# Patient Record
Sex: Female | Born: 1935
Health system: Southern US, Community
[De-identification: ages and names within clinical notes are randomized; demographics above are authoritative.]

## PROBLEM LIST (undated history)

## (undated) DIAGNOSIS — Z9221 Personal history of antineoplastic chemotherapy: Secondary | ICD-10-CM

## (undated) DIAGNOSIS — I10 Essential (primary) hypertension: Secondary | ICD-10-CM

## (undated) DIAGNOSIS — Z1211 Encounter for screening for malignant neoplasm of colon: Secondary | ICD-10-CM

## (undated) DIAGNOSIS — Z1239 Encounter for other screening for malignant neoplasm of breast: Secondary | ICD-10-CM

## (undated) DIAGNOSIS — F0390 Unspecified dementia without behavioral disturbance: Secondary | ICD-10-CM

## (undated) DIAGNOSIS — E079 Disorder of thyroid, unspecified: Secondary | ICD-10-CM

## (undated) DIAGNOSIS — Z87891 Personal history of nicotine dependence: Secondary | ICD-10-CM

## (undated) DIAGNOSIS — E669 Obesity, unspecified: Secondary | ICD-10-CM

## (undated) DIAGNOSIS — Z853 Personal history of malignant neoplasm of breast: Secondary | ICD-10-CM

## (undated) DIAGNOSIS — C50919 Malignant neoplasm of unspecified site of unspecified female breast: Secondary | ICD-10-CM

## (undated) DIAGNOSIS — C50219 Malignant neoplasm of upper-inner quadrant of unspecified female breast: Secondary | ICD-10-CM

## (undated) DIAGNOSIS — Z923 Personal history of irradiation: Secondary | ICD-10-CM

## (undated) DIAGNOSIS — C801 Malignant (primary) neoplasm, unspecified: Secondary | ICD-10-CM

## (undated) HISTORY — DX: Malignant (primary) neoplasm, unspecified: C80.1

## (undated) HISTORY — DX: Essential (primary) hypertension: I10

## (undated) HISTORY — PX: CORONARY STENT PLACEMENT: SHX1402

## (undated) HISTORY — DX: Encounter for other screening for malignant neoplasm of breast: Z12.39

## (undated) HISTORY — DX: Encounter for screening for malignant neoplasm of colon: Z12.11

## (undated) HISTORY — DX: Disorder of thyroid, unspecified: E07.9

## (undated) HISTORY — DX: Malignant neoplasm of upper-inner quadrant of unspecified female breast: C50.219

## (undated) HISTORY — DX: Obesity, unspecified: E66.9

## (undated) HISTORY — DX: Personal history of nicotine dependence: Z87.891

## (undated) HISTORY — DX: Personal history of malignant neoplasm of breast: Z85.3

---

## 1998-01-31 DIAGNOSIS — C50919 Malignant neoplasm of unspecified site of unspecified female breast: Secondary | ICD-10-CM

## 1998-01-31 DIAGNOSIS — C50219 Malignant neoplasm of upper-inner quadrant of unspecified female breast: Secondary | ICD-10-CM

## 1998-01-31 DIAGNOSIS — Z853 Personal history of malignant neoplasm of breast: Secondary | ICD-10-CM

## 1998-01-31 DIAGNOSIS — C801 Malignant (primary) neoplasm, unspecified: Secondary | ICD-10-CM

## 1998-01-31 HISTORY — PX: BREAST LUMPECTOMY: SHX2

## 1998-01-31 HISTORY — DX: Malignant (primary) neoplasm, unspecified: C80.1

## 1998-01-31 HISTORY — DX: Personal history of malignant neoplasm of breast: Z85.3

## 1998-01-31 HISTORY — PX: BREAST SURGERY: SHX581

## 1998-01-31 HISTORY — DX: Malignant neoplasm of unspecified site of unspecified female breast: C50.919

## 1998-01-31 HISTORY — DX: Malignant neoplasm of upper-inner quadrant of unspecified female breast: C50.219

## 2001-01-31 HISTORY — PX: COLONOSCOPY: SHX174

## 2003-02-01 HISTORY — PX: TRACHEOSTOMY: SUR1362

## 2003-02-01 HISTORY — PX: TUBAL LIGATION: SHX77

## 2004-01-05 ENCOUNTER — Ambulatory Visit: Payer: Self-pay | Admitting: General Surgery

## 2004-02-01 HISTORY — PX: BACK SURGERY: SHX140

## 2004-03-26 ENCOUNTER — Ambulatory Visit: Payer: Self-pay | Admitting: Oncology

## 2004-04-11 ENCOUNTER — Emergency Department: Payer: Self-pay | Admitting: Emergency Medicine

## 2004-04-19 ENCOUNTER — Observation Stay: Payer: Self-pay | Admitting: Internal Medicine

## 2004-04-21 ENCOUNTER — Inpatient Hospital Stay (HOSPITAL_COMMUNITY): Admission: AD | Admit: 2004-04-21 | Discharge: 2004-05-06 | Payer: Self-pay | Admitting: Neurosurgery

## 2004-04-21 ENCOUNTER — Ambulatory Visit: Payer: Self-pay | Admitting: Physical Medicine & Rehabilitation

## 2004-05-06 ENCOUNTER — Inpatient Hospital Stay
Admission: RE | Admit: 2004-05-06 | Discharge: 2004-05-18 | Payer: Self-pay | Admitting: Physical Medicine & Rehabilitation

## 2004-06-18 ENCOUNTER — Encounter: Admission: RE | Admit: 2004-06-18 | Discharge: 2004-06-18 | Payer: Self-pay | Admitting: Neurological Surgery

## 2004-06-23 ENCOUNTER — Encounter: Admission: RE | Admit: 2004-06-23 | Discharge: 2004-06-23 | Payer: Self-pay | Admitting: Thoracic Surgery

## 2004-07-23 ENCOUNTER — Ambulatory Visit: Payer: Self-pay | Admitting: Internal Medicine

## 2004-09-03 ENCOUNTER — Encounter: Admission: RE | Admit: 2004-09-03 | Discharge: 2004-09-03 | Payer: Self-pay | Admitting: Neurological Surgery

## 2004-09-22 ENCOUNTER — Ambulatory Visit: Payer: Self-pay | Admitting: Oncology

## 2004-09-22 ENCOUNTER — Encounter: Payer: Self-pay | Admitting: Internal Medicine

## 2004-10-01 ENCOUNTER — Ambulatory Visit: Payer: Self-pay | Admitting: Oncology

## 2004-12-20 ENCOUNTER — Emergency Department: Payer: Self-pay | Admitting: Emergency Medicine

## 2005-01-05 ENCOUNTER — Ambulatory Visit: Payer: Self-pay | Admitting: General Surgery

## 2005-05-02 ENCOUNTER — Ambulatory Visit: Payer: Self-pay | Admitting: Oncology

## 2005-05-31 ENCOUNTER — Ambulatory Visit: Payer: Self-pay | Admitting: Oncology

## 2006-01-06 ENCOUNTER — Emergency Department: Payer: Self-pay | Admitting: Emergency Medicine

## 2006-02-06 ENCOUNTER — Ambulatory Visit: Payer: Self-pay | Admitting: General Surgery

## 2006-06-17 ENCOUNTER — Emergency Department: Payer: Self-pay | Admitting: Emergency Medicine

## 2006-11-14 ENCOUNTER — Ambulatory Visit: Payer: Self-pay | Admitting: Internal Medicine

## 2007-02-01 HISTORY — PX: HIP SURGERY: SHX245

## 2007-02-01 HISTORY — PX: BREAST BIOPSY: SHX20

## 2007-02-16 ENCOUNTER — Ambulatory Visit: Payer: Self-pay | Admitting: General Surgery

## 2007-08-01 ENCOUNTER — Ambulatory Visit: Payer: Self-pay | Admitting: General Surgery

## 2007-12-24 ENCOUNTER — Ambulatory Visit: Payer: Self-pay | Admitting: Orthopedic Surgery

## 2007-12-31 ENCOUNTER — Ambulatory Visit: Payer: Self-pay | Admitting: Orthopedic Surgery

## 2008-01-07 ENCOUNTER — Ambulatory Visit: Payer: Self-pay | Admitting: Orthopedic Surgery

## 2008-02-01 DIAGNOSIS — K219 Gastro-esophageal reflux disease without esophagitis: Secondary | ICD-10-CM | POA: Diagnosis present

## 2008-02-01 DIAGNOSIS — I10 Essential (primary) hypertension: Secondary | ICD-10-CM

## 2008-02-01 DIAGNOSIS — E039 Hypothyroidism, unspecified: Secondary | ICD-10-CM | POA: Diagnosis present

## 2008-02-01 HISTORY — PX: CATARACT EXTRACTION: SUR2

## 2008-02-01 HISTORY — DX: Essential (primary) hypertension: I10

## 2008-02-18 ENCOUNTER — Ambulatory Visit: Payer: Self-pay | Admitting: General Surgery

## 2008-04-24 ENCOUNTER — Ambulatory Visit: Payer: Self-pay | Admitting: Anesthesiology

## 2008-05-22 ENCOUNTER — Ambulatory Visit: Payer: Self-pay | Admitting: Anesthesiology

## 2008-06-19 ENCOUNTER — Ambulatory Visit: Payer: Self-pay | Admitting: Anesthesiology

## 2009-02-23 ENCOUNTER — Ambulatory Visit: Payer: Self-pay | Admitting: General Surgery

## 2009-03-19 ENCOUNTER — Emergency Department: Payer: Self-pay | Admitting: Emergency Medicine

## 2009-07-14 ENCOUNTER — Ambulatory Visit: Payer: Self-pay | Admitting: Specialist

## 2010-01-31 DIAGNOSIS — E079 Disorder of thyroid, unspecified: Secondary | ICD-10-CM

## 2010-01-31 HISTORY — DX: Disorder of thyroid, unspecified: E07.9

## 2010-03-09 ENCOUNTER — Ambulatory Visit: Payer: Self-pay | Admitting: General Surgery

## 2011-03-23 ENCOUNTER — Ambulatory Visit: Payer: Self-pay | Admitting: General Surgery

## 2011-05-03 ENCOUNTER — Ambulatory Visit: Payer: Self-pay | Admitting: General Surgery

## 2011-11-28 ENCOUNTER — Ambulatory Visit: Payer: Self-pay | Admitting: Specialist

## 2011-11-28 LAB — HEMOGLOBIN: HGB: 13.7 g/dL (ref 12.0–16.0)

## 2011-12-05 ENCOUNTER — Ambulatory Visit: Payer: Self-pay | Admitting: Specialist

## 2012-04-30 ENCOUNTER — Encounter: Payer: Self-pay | Admitting: *Deleted

## 2012-04-30 ENCOUNTER — Ambulatory Visit: Payer: Self-pay | Admitting: General Surgery

## 2012-04-30 DIAGNOSIS — C801 Malignant (primary) neoplasm, unspecified: Secondary | ICD-10-CM | POA: Insufficient documentation

## 2012-05-01 ENCOUNTER — Encounter: Payer: Self-pay | Admitting: General Surgery

## 2012-05-09 ENCOUNTER — Ambulatory Visit: Payer: Self-pay | Admitting: General Surgery

## 2012-06-12 ENCOUNTER — Other Ambulatory Visit: Payer: Self-pay | Admitting: *Deleted

## 2012-06-12 ENCOUNTER — Encounter: Payer: Self-pay | Admitting: General Surgery

## 2012-06-12 ENCOUNTER — Ambulatory Visit (INDEPENDENT_AMBULATORY_CARE_PROVIDER_SITE_OTHER): Payer: Medicare Other | Admitting: General Surgery

## 2012-06-12 VITALS — BP 130/62 | HR 80 | Resp 16 | Ht 60.0 in | Wt 147.0 lb

## 2012-06-12 DIAGNOSIS — Z853 Personal history of malignant neoplasm of breast: Secondary | ICD-10-CM

## 2012-06-12 NOTE — Patient Instructions (Signed)
Patient to return in one year. 

## 2012-06-12 NOTE — Progress Notes (Signed)
The patient has been asked to return to the office in one year for a bilateral diagnostic mammogram. 
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
## 2013-05-01 ENCOUNTER — Ambulatory Visit: Payer: Self-pay | Admitting: General Surgery

## 2013-05-02 ENCOUNTER — Encounter: Payer: Self-pay | Admitting: General Surgery

## 2013-05-08 ENCOUNTER — Ambulatory Visit (INDEPENDENT_AMBULATORY_CARE_PROVIDER_SITE_OTHER): Payer: Medicare HMO | Admitting: General Surgery

## 2013-05-08 ENCOUNTER — Encounter: Payer: Self-pay | Admitting: General Surgery

## 2013-05-08 VITALS — BP 110/78 | HR 80 | Resp 14 | Ht 60.0 in | Wt 144.0 lb

## 2013-05-08 DIAGNOSIS — Z853 Personal history of malignant neoplasm of breast: Secondary | ICD-10-CM
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
## 2013-05-08 NOTE — Patient Instructions (Signed)
Continue self breast exams. Call office for any new breast issues or concerns. 

## 2013-12-02 ENCOUNTER — Encounter: Payer: Self-pay | Admitting: General Surgery

## 2014-05-05 ENCOUNTER — Ambulatory Visit
Admit: 2014-05-05 | Disposition: A | Discharge status: Home / Self Care | Payer: Self-pay | Attending: General Surgery | Admitting: General Surgery

## 2014-05-07 ENCOUNTER — Encounter: Payer: Self-pay | Admitting: General Surgery

## 2014-05-14 ENCOUNTER — Ambulatory Visit (INDEPENDENT_AMBULATORY_CARE_PROVIDER_SITE_OTHER): Payer: PPO | Admitting: General Surgery

## 2014-05-14 VITALS — BP 128/68 | HR 66 | Resp 12 | Ht 60.0 in | Wt 140.0 lb

## 2014-05-14 DIAGNOSIS — Z853 Personal history of malignant neoplasm of breast: Secondary | ICD-10-CM
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
## 2014-05-14 NOTE — Patient Instructions (Signed)
Patient will be asked to return to the office in one year with a bilateral screening mammogram. 

## 2014-05-20 NOTE — Op Note (Signed)
PATIENT NAME:  Lisa Martin, Lisa Martin MR#:  [id number] DATE OF BIRTH:  26-Dec-1931  DATE OF PROCEDURE:  12/05/2011  PREOPERATIVE DIAGNOSIS: Left carpal tunnel syndrome.   POSTOPERATIVE DIAGNOSIS: Left carpal tunnel syndrome.   PROCEDURE: Left carpal tunnel release.   SURGEON: Lucas Mallow, MD   ANESTHESIA: General.   COMPLICATIONS: None.   TOURNIQUET TIME: Approximately 15 minutes.   PROCEDURE: After adequate induction of general anesthesia, the left upper extremity is thoroughly prepped with alcohol and ChloraPrep and draped in standard sterile fashion. The extremity is wrapped out with the Esmarch bandage and pneumatic tourniquet elevated to 250 mmHg. Under loupe magnification, standard volar carpal tunnel incision is made and the dissection carried down to the transverse retinacular ligament. This is incised in the midportion with the knife. The proximal release is performed with the small scissors. The distal release is performed with the small scissors and the carpal tunnel scissors. There is seen to be severe compression of the nerve directly beneath the ligament. No mass or synovitis is noted. The wound is thoroughly irrigated multiple times. Careful check is made both proximally and distally to ensure that complete release had been obtained. Skin edges are infiltrated with 0.5% plain Marcaine. The skin is closed with 4-0 nylon. Soft bulky dressing is applied. Tourniquet is released. The patient is returned to the recovery room in satisfactory condition having tolerated the procedure quite well.   ____________________________ Lucas Mallow, MD ces:drc D: 12/05/2011 13:37:03 ET T: 12/05/2011 17:19:54 ET JOB#: [id number]  cc: Lucas Mallow, MD, <Dictator> Lucas Mallow MD ELECTRONICALLY SIGNED 12/05/2011 19:11

## 2014-08-08 ENCOUNTER — Other Ambulatory Visit: Payer: Self-pay | Admitting: Physical Medicine and Rehabilitation

## 2014-08-08 DIAGNOSIS — M5416 Radiculopathy, lumbar region: Secondary | ICD-10-CM

## 2014-09-10 ENCOUNTER — Ambulatory Visit
Admission: RE | Admit: 2014-09-10 | Discharge: 2014-09-10 | Disposition: A | Discharge status: Home / Self Care | Payer: PPO | Source: Ambulatory Visit | Attending: Physical Medicine and Rehabilitation | Admitting: Physical Medicine and Rehabilitation

## 2014-09-10 DIAGNOSIS — M47897 Other spondylosis, lumbosacral region: Secondary | ICD-10-CM | POA: Diagnosis not present

## 2014-09-10 DIAGNOSIS — M4806 Spinal stenosis, lumbar region: Secondary | ICD-10-CM | POA: Diagnosis not present

## 2014-09-10 DIAGNOSIS — S22089A Unspecified fracture of T11-T12 vertebra, initial encounter for closed fracture: Secondary | ICD-10-CM | POA: Insufficient documentation

## 2014-09-10 DIAGNOSIS — M4807 Spinal stenosis, lumbosacral region: Secondary | ICD-10-CM | POA: Diagnosis not present

## 2014-09-10 DIAGNOSIS — M5136 Other intervertebral disc degeneration, lumbar region: Secondary | ICD-10-CM | POA: Insufficient documentation

## 2014-09-10 DIAGNOSIS — M5416 Radiculopathy, lumbar region: Secondary | ICD-10-CM | POA: Diagnosis present

## 2015-02-11 DIAGNOSIS — E039 Hypothyroidism, unspecified: Secondary | ICD-10-CM | POA: Diagnosis not present

## 2015-02-11 DIAGNOSIS — R5383 Other fatigue: Secondary | ICD-10-CM | POA: Diagnosis not present

## 2015-02-11 DIAGNOSIS — I10 Essential (primary) hypertension: Secondary | ICD-10-CM | POA: Diagnosis not present

## 2015-02-11 DIAGNOSIS — E538 Deficiency of other specified B group vitamins: Secondary | ICD-10-CM | POA: Diagnosis not present

## 2015-02-11 DIAGNOSIS — R05 Cough: Secondary | ICD-10-CM | POA: Diagnosis not present

## 2015-02-11 DIAGNOSIS — R0602 Shortness of breath: Secondary | ICD-10-CM | POA: Diagnosis not present

## 2015-03-03 ENCOUNTER — Other Ambulatory Visit: Payer: Self-pay | Admitting: *Deleted

## 2015-03-03 DIAGNOSIS — Z853 Personal history of malignant neoplasm of breast: Secondary | ICD-10-CM

## 2015-03-10 ENCOUNTER — Encounter: Payer: Self-pay | Admitting: *Deleted

## 2015-05-06 ENCOUNTER — Ambulatory Visit
Admission: RE | Admit: 2015-05-06 | Discharge: 2015-05-06 | Disposition: A | Discharge status: Home / Self Care | Payer: PPO | Source: Ambulatory Visit | Attending: General Surgery | Admitting: General Surgery

## 2015-05-06 DIAGNOSIS — Z853 Personal history of malignant neoplasm of breast: Secondary | ICD-10-CM

## 2015-05-06 DIAGNOSIS — Z1231 Encounter for screening mammogram for malignant neoplasm of breast: Secondary | ICD-10-CM | POA: Diagnosis not present

## 2015-05-06 HISTORY — DX: Malignant neoplasm of unspecified site of unspecified female breast: C50.919

## 2015-05-14 ENCOUNTER — Encounter: Payer: Self-pay | Admitting: General Surgery

## 2015-05-14 ENCOUNTER — Ambulatory Visit (INDEPENDENT_AMBULATORY_CARE_PROVIDER_SITE_OTHER): Payer: PPO | Admitting: General Surgery

## 2015-05-14 VITALS — BP 130/72 | HR 82 | Resp 14 | Ht 60.0 in | Wt 128.0 lb

## 2015-05-14 DIAGNOSIS — Z853 Personal history of malignant neoplasm of breast: Secondary | ICD-10-CM | POA: Diagnosis not present

## 2015-05-14 NOTE — Patient Instructions (Addendum)
The patient is aware to call back for any questions or concerns. Patient will be asked to return to the office in one year with a bilateral screening mammogram. 
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
## 2015-06-25 DIAGNOSIS — M810 Age-related osteoporosis without current pathological fracture: Secondary | ICD-10-CM | POA: Diagnosis not present

## 2015-06-25 DIAGNOSIS — E538 Deficiency of other specified B group vitamins: Secondary | ICD-10-CM | POA: Diagnosis not present

## 2015-06-25 DIAGNOSIS — Z0000 Encounter for general adult medical examination without abnormal findings: Secondary | ICD-10-CM | POA: Diagnosis not present

## 2015-07-02 DIAGNOSIS — Z0000 Encounter for general adult medical examination without abnormal findings: Secondary | ICD-10-CM | POA: Diagnosis not present

## 2015-07-02 DIAGNOSIS — E538 Deficiency of other specified B group vitamins: Secondary | ICD-10-CM | POA: Diagnosis not present

## 2015-07-02 DIAGNOSIS — E039 Hypothyroidism, unspecified: Secondary | ICD-10-CM | POA: Diagnosis not present

## 2015-07-02 DIAGNOSIS — M810 Age-related osteoporosis without current pathological fracture: Secondary | ICD-10-CM | POA: Diagnosis not present

## 2015-07-06 DIAGNOSIS — Z23 Encounter for immunization: Secondary | ICD-10-CM | POA: Diagnosis not present

## 2015-07-06 DIAGNOSIS — S80811A Abrasion, right lower leg, initial encounter: Secondary | ICD-10-CM | POA: Diagnosis not present

## 2015-08-06 DIAGNOSIS — M810 Age-related osteoporosis without current pathological fracture: Secondary | ICD-10-CM | POA: Diagnosis not present

## 2015-08-09 ENCOUNTER — Emergency Department: Payer: PPO

## 2015-08-09 ENCOUNTER — Emergency Department
Admission: EM | Admit: 2015-08-09 | Discharge: 2015-08-10 | Disposition: A | Discharge status: Home / Self Care | Payer: PPO | Attending: Emergency Medicine | Admitting: Emergency Medicine

## 2015-08-09 DIAGNOSIS — Z79899 Other long term (current) drug therapy: Secondary | ICD-10-CM | POA: Insufficient documentation

## 2015-08-09 DIAGNOSIS — M79605 Pain in left leg: Secondary | ICD-10-CM | POA: Diagnosis not present

## 2015-08-09 DIAGNOSIS — Z853 Personal history of malignant neoplasm of breast: Secondary | ICD-10-CM | POA: Insufficient documentation

## 2015-08-09 DIAGNOSIS — I10 Essential (primary) hypertension: Secondary | ICD-10-CM | POA: Insufficient documentation

## 2015-08-09 DIAGNOSIS — Z87891 Personal history of nicotine dependence: Secondary | ICD-10-CM | POA: Diagnosis not present

## 2015-08-09 DIAGNOSIS — M79652 Pain in left thigh: Secondary | ICD-10-CM | POA: Diagnosis not present

## 2015-08-09 DIAGNOSIS — M79662 Pain in left lower leg: Secondary | ICD-10-CM | POA: Diagnosis not present

## 2015-08-09 MED ORDER — OXYCODONE-ACETAMINOPHEN 5-325 MG PO TABS
1.0000 | ORAL_TABLET | Freq: Once | ORAL | Status: AC
  Administered 2015-08-10: 1 via ORAL
  Filled 2015-08-09: qty 1

## 2015-08-09 NOTE — ED Notes (Signed)
Patient to ED via POV for left leg pain after a fall onto concrete 30 mins PTA. Denies striking her head. Alert and oriented x 3.
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
## 2015-08-09 NOTE — ED Notes (Signed)
Radiology tech in to take xray.

## 2015-08-10 MED ORDER — ETODOLAC 200 MG PO CAPS: 200.0000 mg | ORAL_CAPSULE | Freq: Three times a day (TID) | ORAL | Status: DC

## 2015-08-10 NOTE — Discharge Instructions (Signed)

## 2015-08-10 NOTE — ED Notes (Signed)
Pt ambulated approximately 80 feet with use of a rolling walker with slow gait. Pt left leg became weak x 1 with pt able to move her weight to her right leg and steady herself. Dr Dahlia Client aware/

## 2015-08-13 DIAGNOSIS — M25551 Pain in right hip: Secondary | ICD-10-CM | POA: Diagnosis not present

## 2015-08-28 DIAGNOSIS — M5430 Sciatica, unspecified side: Secondary | ICD-10-CM | POA: Diagnosis not present

## 2015-09-01 DIAGNOSIS — E039 Hypothyroidism, unspecified: Secondary | ICD-10-CM | POA: Diagnosis not present

## 2015-09-15 DIAGNOSIS — E039 Hypothyroidism, unspecified: Secondary | ICD-10-CM | POA: Diagnosis not present

## 2015-09-15 DIAGNOSIS — R531 Weakness: Secondary | ICD-10-CM | POA: Diagnosis not present

## 2015-09-15 DIAGNOSIS — I10 Essential (primary) hypertension: Secondary | ICD-10-CM | POA: Diagnosis not present

## 2015-10-21 ENCOUNTER — Ambulatory Visit
Admission: RE | Admit: 2015-10-21 | Discharge: 2015-10-21 | Disposition: A | Discharge status: Home / Self Care | Payer: PPO | Source: Ambulatory Visit | Attending: Unknown Physician Specialty | Admitting: Unknown Physician Specialty

## 2015-10-21 ENCOUNTER — Other Ambulatory Visit: Payer: Self-pay | Admitting: Unknown Physician Specialty

## 2015-10-21 DIAGNOSIS — R05 Cough: Secondary | ICD-10-CM

## 2015-10-21 DIAGNOSIS — J449 Chronic obstructive pulmonary disease, unspecified: Secondary | ICD-10-CM | POA: Diagnosis not present

## 2015-10-21 DIAGNOSIS — H903 Sensorineural hearing loss, bilateral: Secondary | ICD-10-CM | POA: Diagnosis not present

## 2015-10-21 DIAGNOSIS — J984 Other disorders of lung: Secondary | ICD-10-CM | POA: Insufficient documentation

## 2015-10-21 DIAGNOSIS — H6121 Impacted cerumen, right ear: Secondary | ICD-10-CM | POA: Diagnosis not present

## 2015-10-21 DIAGNOSIS — R059 Cough, unspecified: Secondary | ICD-10-CM

## 2016-01-19 DIAGNOSIS — I10 Essential (primary) hypertension: Secondary | ICD-10-CM | POA: Diagnosis not present

## 2016-01-19 DIAGNOSIS — J449 Chronic obstructive pulmonary disease, unspecified: Secondary | ICD-10-CM | POA: Diagnosis not present

## 2016-01-19 DIAGNOSIS — R05 Cough: Secondary | ICD-10-CM | POA: Diagnosis not present

## 2016-03-17 ENCOUNTER — Other Ambulatory Visit: Payer: Self-pay

## 2016-03-17 DIAGNOSIS — Z1231 Encounter for screening mammogram for malignant neoplasm of breast: Secondary | ICD-10-CM

## 2016-03-17 DIAGNOSIS — Z853 Personal history of malignant neoplasm of breast: Secondary | ICD-10-CM

## 2016-04-19 DIAGNOSIS — R05 Cough: Secondary | ICD-10-CM | POA: Diagnosis not present

## 2016-04-19 DIAGNOSIS — I10 Essential (primary) hypertension: Secondary | ICD-10-CM | POA: Diagnosis not present

## 2016-04-19 DIAGNOSIS — M542 Cervicalgia: Secondary | ICD-10-CM | POA: Diagnosis not present

## 2016-05-11 ENCOUNTER — Telehealth: Payer: Self-pay | Admitting: *Deleted

## 2016-05-11 NOTE — Telephone Encounter (Signed)
Left message for patient to call back to reschedule appointment, was a no show for mammogram.

## 2016-05-17 ENCOUNTER — Ambulatory Visit: Payer: PPO | Admitting: General Surgery

## 2016-06-09 ENCOUNTER — Encounter: Payer: Self-pay | Admitting: *Deleted

## 2016-06-28 DIAGNOSIS — E538 Deficiency of other specified B group vitamins: Secondary | ICD-10-CM | POA: Diagnosis not present

## 2016-06-28 DIAGNOSIS — Z0000 Encounter for general adult medical examination without abnormal findings: Secondary | ICD-10-CM | POA: Diagnosis not present

## 2016-06-28 DIAGNOSIS — E039 Hypothyroidism, unspecified: Secondary | ICD-10-CM | POA: Diagnosis not present

## 2016-07-01 ENCOUNTER — Ambulatory Visit
Admission: RE | Admit: 2016-07-01 | Discharge: 2016-07-01 | Disposition: A | Discharge status: Home / Self Care | Payer: PPO | Source: Ambulatory Visit | Attending: General Surgery | Admitting: General Surgery

## 2016-07-01 DIAGNOSIS — Z1231 Encounter for screening mammogram for malignant neoplasm of breast: Secondary | ICD-10-CM | POA: Insufficient documentation

## 2016-07-04 ENCOUNTER — Encounter: Payer: Self-pay | Admitting: *Deleted

## 2016-07-04 DIAGNOSIS — Z0000 Encounter for general adult medical examination without abnormal findings: Secondary | ICD-10-CM | POA: Diagnosis not present

## 2016-07-04 DIAGNOSIS — M818 Other osteoporosis without current pathological fracture: Secondary | ICD-10-CM | POA: Diagnosis not present

## 2016-07-04 DIAGNOSIS — R05 Cough: Secondary | ICD-10-CM | POA: Diagnosis not present

## 2016-07-04 DIAGNOSIS — Z79899 Other long term (current) drug therapy: Secondary | ICD-10-CM | POA: Diagnosis not present

## 2016-07-04 DIAGNOSIS — E538 Deficiency of other specified B group vitamins: Secondary | ICD-10-CM | POA: Diagnosis not present

## 2016-07-04 DIAGNOSIS — E039 Hypothyroidism, unspecified: Secondary | ICD-10-CM | POA: Diagnosis not present

## 2016-07-05 ENCOUNTER — Other Ambulatory Visit: Payer: Self-pay | Admitting: Internal Medicine

## 2016-07-05 DIAGNOSIS — R9389 Abnormal findings on diagnostic imaging of other specified body structures: Secondary | ICD-10-CM

## 2016-07-05 DIAGNOSIS — R053 Chronic cough: Secondary | ICD-10-CM

## 2016-07-05 DIAGNOSIS — R05 Cough: Secondary | ICD-10-CM

## 2016-07-07 ENCOUNTER — Encounter: Payer: Self-pay | Admitting: General Surgery

## 2016-07-07 ENCOUNTER — Ambulatory Visit (INDEPENDENT_AMBULATORY_CARE_PROVIDER_SITE_OTHER): Payer: PPO | Admitting: General Surgery

## 2016-07-07 VITALS — BP 124/62 | HR 74 | Resp 12 | Ht 60.0 in | Wt 120.0 lb

## 2016-07-07 DIAGNOSIS — Z853 Personal history of malignant neoplasm of breast: Secondary | ICD-10-CM | POA: Diagnosis not present

## 2016-07-07 NOTE — Patient Instructions (Signed)
The patient is aware to call back for any questions or concerns.  
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
## 2016-07-15 ENCOUNTER — Ambulatory Visit
Admission: RE | Admit: 2016-07-15 | Discharge: 2016-07-15 | Disposition: A | Discharge status: Home / Self Care | Payer: PPO | Source: Ambulatory Visit | Attending: Internal Medicine | Admitting: Internal Medicine

## 2016-07-15 DIAGNOSIS — R05 Cough: Secondary | ICD-10-CM | POA: Diagnosis not present

## 2016-07-15 DIAGNOSIS — R938 Abnormal findings on diagnostic imaging of other specified body structures: Secondary | ICD-10-CM | POA: Insufficient documentation

## 2016-07-15 DIAGNOSIS — J984 Other disorders of lung: Secondary | ICD-10-CM | POA: Insufficient documentation

## 2016-07-15 DIAGNOSIS — I700 Atherosclerosis of aorta: Secondary | ICD-10-CM | POA: Diagnosis not present

## 2016-07-15 DIAGNOSIS — J439 Emphysema, unspecified: Secondary | ICD-10-CM | POA: Diagnosis not present

## 2016-07-15 DIAGNOSIS — Y842 Radiological procedure and radiotherapy as the cause of abnormal reaction of the patient, or of later complication, without mention of misadventure at the time of the procedure: Secondary | ICD-10-CM | POA: Diagnosis not present

## 2016-07-15 DIAGNOSIS — I2510 Atherosclerotic heart disease of native coronary artery without angina pectoris: Secondary | ICD-10-CM | POA: Diagnosis not present

## 2016-07-15 DIAGNOSIS — R053 Chronic cough: Secondary | ICD-10-CM

## 2016-07-15 DIAGNOSIS — R9389 Abnormal findings on diagnostic imaging of other specified body structures: Secondary | ICD-10-CM

## 2016-07-15 MED ORDER — IOPAMIDOL (ISOVUE-300) INJECTION 61%
75.0000 mL | Freq: Once | INTRAVENOUS | Status: AC | PRN
  Administered 2016-07-15: 75 mL via INTRAVENOUS

## 2016-08-22 DIAGNOSIS — J701 Chronic and other pulmonary manifestations due to radiation: Secondary | ICD-10-CM | POA: Diagnosis not present

## 2016-08-22 DIAGNOSIS — E538 Deficiency of other specified B group vitamins: Secondary | ICD-10-CM | POA: Diagnosis not present

## 2016-08-22 DIAGNOSIS — E039 Hypothyroidism, unspecified: Secondary | ICD-10-CM | POA: Diagnosis not present

## 2016-09-25 ENCOUNTER — Encounter: Payer: Self-pay | Admitting: Emergency Medicine

## 2016-09-25 ENCOUNTER — Emergency Department: Payer: PPO

## 2016-09-25 ENCOUNTER — Emergency Department
Admission: EM | Admit: 2016-09-25 | Discharge: 2016-09-25 | Disposition: A | Discharge status: Home / Self Care | Payer: PPO | Attending: Emergency Medicine | Admitting: Emergency Medicine

## 2016-09-25 DIAGNOSIS — Y9389 Activity, other specified: Secondary | ICD-10-CM | POA: Insufficient documentation

## 2016-09-25 DIAGNOSIS — S52502A Unspecified fracture of the lower end of left radius, initial encounter for closed fracture: Secondary | ICD-10-CM

## 2016-09-25 DIAGNOSIS — Y929 Unspecified place or not applicable: Secondary | ICD-10-CM | POA: Diagnosis not present

## 2016-09-25 DIAGNOSIS — W109XXA Fall (on) (from) unspecified stairs and steps, initial encounter: Secondary | ICD-10-CM | POA: Insufficient documentation

## 2016-09-25 DIAGNOSIS — Z79899 Other long term (current) drug therapy: Secondary | ICD-10-CM | POA: Diagnosis not present

## 2016-09-25 DIAGNOSIS — Z853 Personal history of malignant neoplasm of breast: Secondary | ICD-10-CM | POA: Insufficient documentation

## 2016-09-25 DIAGNOSIS — Z96642 Presence of left artificial hip joint: Secondary | ICD-10-CM | POA: Diagnosis not present

## 2016-09-25 DIAGNOSIS — I10 Essential (primary) hypertension: Secondary | ICD-10-CM | POA: Diagnosis not present

## 2016-09-25 DIAGNOSIS — S6992XA Unspecified injury of left wrist, hand and finger(s), initial encounter: Secondary | ICD-10-CM | POA: Diagnosis not present

## 2016-09-25 DIAGNOSIS — Z87891 Personal history of nicotine dependence: Secondary | ICD-10-CM | POA: Insufficient documentation

## 2016-09-25 DIAGNOSIS — Y999 Unspecified external cause status: Secondary | ICD-10-CM | POA: Diagnosis not present

## 2016-09-25 DIAGNOSIS — S52602A Unspecified fracture of lower end of left ulna, initial encounter for closed fracture: Secondary | ICD-10-CM | POA: Insufficient documentation

## 2016-09-25 DIAGNOSIS — S52572A Other intraarticular fracture of lower end of left radius, initial encounter for closed fracture: Secondary | ICD-10-CM

## 2016-09-25 MED ORDER — TRAMADOL HCL 50 MG PO TABS
50.0000 mg | ORAL_TABLET | Freq: Once | ORAL | Status: AC
  Administered 2016-09-25: 50 mg via ORAL
  Filled 2016-09-25: qty 1

## 2016-09-25 MED ORDER — TRAMADOL HCL 50 MG PO TABS: 50.0000 mg | ORAL_TABLET | Freq: Four times a day (QID) | ORAL | 0 refills | Status: DC | PRN

## 2016-09-25 NOTE — Discharge Instructions (Signed)
Please keep the left upper extremity elevated, work on gentle range of motion of the digits. Take Tylenol and tramadol as needed for pain.You may continue with etodolac. Please call orthopedic office tomorrow to schedule follow-up appointment.

## 2016-09-25 NOTE — ED Notes (Signed)
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
## 2016-09-25 NOTE — ED Triage Notes (Signed)
Pt arrives from home with c/o left wrist injury. Pt was carrying multiple objects and overcompensated and fell onto her left wrist. Pt denies LOC or head trauma of any kind and is on no blood thinners. Pt has noticeable deformity to left wrist. Pt is in NAD at this time.

## 2016-09-27 DIAGNOSIS — S52552A Other extraarticular fracture of lower end of left radius, initial encounter for closed fracture: Secondary | ICD-10-CM | POA: Diagnosis not present

## 2016-09-27 DIAGNOSIS — S52692A Other fracture of lower end of left ulna, initial encounter for closed fracture: Secondary | ICD-10-CM | POA: Diagnosis not present

## 2016-09-27 DIAGNOSIS — W108XXA Fall (on) (from) other stairs and steps, initial encounter: Secondary | ICD-10-CM | POA: Diagnosis not present

## 2016-09-30 ENCOUNTER — Encounter: Payer: Self-pay | Admitting: *Deleted

## 2016-09-30 ENCOUNTER — Ambulatory Visit
Admission: RE | Admit: 2016-09-30 | Discharge: 2016-09-30 | Disposition: A | Discharge status: Home / Self Care | Payer: PPO | Source: Ambulatory Visit | Attending: Orthopedic Surgery | Admitting: Orthopedic Surgery

## 2016-09-30 ENCOUNTER — Ambulatory Visit: Payer: PPO | Admitting: Anesthesiology

## 2016-09-30 ENCOUNTER — Ambulatory Visit: Payer: PPO

## 2016-09-30 ENCOUNTER — Encounter
Admission: RE | Disposition: A | Discharge status: Home / Self Care | Payer: Self-pay | Source: Ambulatory Visit | Attending: Orthopedic Surgery

## 2016-09-30 DIAGNOSIS — S52502B Unspecified fracture of the lower end of left radius, initial encounter for open fracture type I or II: Secondary | ICD-10-CM | POA: Diagnosis not present

## 2016-09-30 DIAGNOSIS — Z888 Allergy status to other drugs, medicaments and biological substances status: Secondary | ICD-10-CM | POA: Diagnosis not present

## 2016-09-30 DIAGNOSIS — Z4789 Encounter for other orthopedic aftercare: Secondary | ICD-10-CM | POA: Diagnosis not present

## 2016-09-30 DIAGNOSIS — W108XXA Fall (on) (from) other stairs and steps, initial encounter: Secondary | ICD-10-CM | POA: Diagnosis not present

## 2016-09-30 DIAGNOSIS — S52572A Other intraarticular fracture of lower end of left radius, initial encounter for closed fracture: Secondary | ICD-10-CM | POA: Diagnosis not present

## 2016-09-30 DIAGNOSIS — E039 Hypothyroidism, unspecified: Secondary | ICD-10-CM | POA: Diagnosis not present

## 2016-09-30 DIAGNOSIS — Z853 Personal history of malignant neoplasm of breast: Secondary | ICD-10-CM | POA: Insufficient documentation

## 2016-09-30 DIAGNOSIS — S52692A Other fracture of lower end of left ulna, initial encounter for closed fracture: Secondary | ICD-10-CM | POA: Diagnosis not present

## 2016-09-30 DIAGNOSIS — Y9289 Other specified places as the place of occurrence of the external cause: Secondary | ICD-10-CM | POA: Diagnosis not present

## 2016-09-30 DIAGNOSIS — Z87891 Personal history of nicotine dependence: Secondary | ICD-10-CM | POA: Diagnosis not present

## 2016-09-30 DIAGNOSIS — Z955 Presence of coronary angioplasty implant and graft: Secondary | ICD-10-CM | POA: Diagnosis not present

## 2016-09-30 DIAGNOSIS — I10 Essential (primary) hypertension: Secondary | ICD-10-CM | POA: Diagnosis not present

## 2016-09-30 DIAGNOSIS — M810 Age-related osteoporosis without current pathological fracture: Secondary | ICD-10-CM | POA: Diagnosis not present

## 2016-09-30 DIAGNOSIS — S52552A Other extraarticular fracture of lower end of left radius, initial encounter for closed fracture: Secondary | ICD-10-CM | POA: Diagnosis not present

## 2016-09-30 DIAGNOSIS — Z8781 Personal history of (healed) traumatic fracture: Secondary | ICD-10-CM

## 2016-09-30 DIAGNOSIS — Z96642 Presence of left artificial hip joint: Secondary | ICD-10-CM | POA: Diagnosis not present

## 2016-09-30 DIAGNOSIS — Z9889 Other specified postprocedural states: Secondary | ICD-10-CM

## 2016-09-30 HISTORY — PX: ORIF WRIST FRACTURE: SHX2133

## 2016-09-30 SURGERY — OPEN REDUCTION INTERNAL FIXATION (ORIF) WRIST FRACTURE
Anesthesia: General | Site: Wrist | Laterality: Left | Wound class: Clean

## 2016-09-30 MED ORDER — HYDROMORPHONE HCL 1 MG/ML IJ SOLN
INTRAMUSCULAR | Status: AC
  Administered 2016-09-30: 0.5 mg via INTRAVENOUS
  Filled 2016-09-30: qty 1

## 2016-09-30 MED ORDER — ONDANSETRON HCL 4 MG/2ML IJ SOLN
INTRAMUSCULAR | Status: DC | PRN
  Administered 2016-09-30: 4 mg via INTRAVENOUS

## 2016-09-30 MED ORDER — ONDANSETRON HCL 4 MG/2ML IJ SOLN: 4.0000 mg | Freq: Once | INTRAMUSCULAR | Status: DC | PRN

## 2016-09-30 MED ORDER — ACETAMINOPHEN 10 MG/ML IV SOLN
1000.0000 mg | Freq: Once | INTRAVENOUS | Status: AC
  Administered 2016-09-30: 1000 mg via INTRAVENOUS

## 2016-09-30 MED ORDER — HYDROMORPHONE HCL 1 MG/ML IJ SOLN
0.5000 mg | INTRAMUSCULAR | Status: AC | PRN
  Administered 2016-09-30 (×4): 0.5 mg via INTRAVENOUS

## 2016-09-30 MED ORDER — DIPHENHYDRAMINE HCL 50 MG/ML IJ SOLN
INTRAMUSCULAR | Status: AC
  Administered 2016-09-30: 12.5 mg via INTRAVENOUS
  Filled 2016-09-30: qty 1

## 2016-09-30 MED ORDER — DIPHENHYDRAMINE HCL 50 MG/ML IJ SOLN
12.5000 mg | Freq: Once | INTRAMUSCULAR | Status: AC
  Administered 2016-09-30: 12.5 mg via INTRAVENOUS

## 2016-09-30 MED ORDER — FENTANYL CITRATE (PF) 100 MCG/2ML IJ SOLN
INTRAMUSCULAR | Status: AC
  Filled 2016-09-30: qty 2

## 2016-09-30 MED ORDER — KETOROLAC TROMETHAMINE 30 MG/ML IJ SOLN
INTRAMUSCULAR | Status: AC
  Administered 2016-09-30: 15 mg
  Filled 2016-09-30: qty 1

## 2016-09-30 MED ORDER — GLYCOPYRROLATE 0.2 MG/ML IJ SOLN
INTRAMUSCULAR | Status: DC | PRN
  Administered 2016-09-30: 0.1 mg via INTRAVENOUS

## 2016-09-30 MED ORDER — METOCLOPRAMIDE HCL 5 MG/ML IJ SOLN: 5.0000 mg | Freq: Three times a day (TID) | INTRAMUSCULAR | Status: DC | PRN

## 2016-09-30 MED ORDER — ONDANSETRON HCL 4 MG PO TABS: 4.0000 mg | ORAL_TABLET | Freq: Four times a day (QID) | ORAL | Status: DC | PRN

## 2016-09-30 MED ORDER — HYDROCODONE-ACETAMINOPHEN 5-325 MG PO TABS: 1.0000 | ORAL_TABLET | Freq: Four times a day (QID) | ORAL | 0 refills | Status: DC | PRN

## 2016-09-30 MED ORDER — FENTANYL CITRATE (PF) 100 MCG/2ML IJ SOLN
25.0000 ug | INTRAMUSCULAR | Status: DC | PRN
  Administered 2016-09-30 (×4): 25 ug via INTRAVENOUS

## 2016-09-30 MED ORDER — ONDANSETRON HCL 4 MG/2ML IJ SOLN
INTRAMUSCULAR | Status: AC
Start: 2016-09-30 — End: 2016-09-30
  Filled 2016-09-30: qty 2

## 2016-09-30 MED ORDER — GLYCOPYRROLATE 0.2 MG/ML IJ SOLN
INTRAMUSCULAR | Status: AC
  Filled 2016-09-30: qty 1

## 2016-09-30 MED ORDER — SODIUM CHLORIDE 0.9 % IV SOLN: INTRAVENOUS | Status: DC

## 2016-09-30 MED ORDER — EPHEDRINE SULFATE 50 MG/ML IJ SOLN
INTRAMUSCULAR | Status: DC | PRN
  Administered 2016-09-30: 10 mg via INTRAVENOUS

## 2016-09-30 MED ORDER — FENTANYL CITRATE (PF) 100 MCG/2ML IJ SOLN
INTRAMUSCULAR | Status: DC | PRN
  Administered 2016-09-30 (×2): 50 ug via INTRAVENOUS

## 2016-09-30 MED ORDER — DEXAMETHASONE SODIUM PHOSPHATE 10 MG/ML IJ SOLN
INTRAMUSCULAR | Status: AC
  Filled 2016-09-30: qty 1

## 2016-09-30 MED ORDER — METOCLOPRAMIDE HCL 10 MG PO TABS: 5.0000 mg | ORAL_TABLET | Freq: Three times a day (TID) | ORAL | Status: DC | PRN

## 2016-09-30 MED ORDER — HYDROCODONE-ACETAMINOPHEN 5-325 MG PO TABS: 1.0000 | ORAL_TABLET | ORAL | Status: DC | PRN

## 2016-09-30 MED ORDER — PROPOFOL 10 MG/ML IV BOLUS
INTRAVENOUS | Status: DC | PRN
  Administered 2016-09-30: 150 mg via INTRAVENOUS

## 2016-09-30 MED ORDER — KETOROLAC TROMETHAMINE 15 MG/ML IJ SOLN
15.0000 mg | Freq: Once | INTRAMUSCULAR | Status: AC
  Administered 2016-09-30: 15 mg via INTRAVENOUS
  Filled 2016-09-30: qty 1

## 2016-09-30 MED ORDER — FENTANYL CITRATE (PF) 100 MCG/2ML IJ SOLN
INTRAMUSCULAR | Status: AC
  Administered 2016-09-30: 25 ug via INTRAVENOUS
  Filled 2016-09-30: qty 2

## 2016-09-30 MED ORDER — LACTATED RINGERS IV SOLN
INTRAVENOUS | Status: DC
  Administered 2016-09-30: 12:00:00 via INTRAVENOUS

## 2016-09-30 MED ORDER — NEOMYCIN-POLYMYXIN B GU 40-200000 IR SOLN
Status: AC
  Filled 2016-09-30: qty 2

## 2016-09-30 MED ORDER — ONDANSETRON HCL 4 MG/2ML IJ SOLN: 4.0000 mg | Freq: Four times a day (QID) | INTRAMUSCULAR | Status: DC | PRN

## 2016-09-30 MED ORDER — DEXAMETHASONE SODIUM PHOSPHATE 10 MG/ML IJ SOLN
INTRAMUSCULAR | Status: DC | PRN
  Administered 2016-09-30: 10 mg via INTRAVENOUS

## 2016-09-30 MED ORDER — ACETAMINOPHEN 10 MG/ML IV SOLN
INTRAVENOUS | Status: AC
  Administered 2016-09-30: 1000 mg via INTRAVENOUS
  Filled 2016-09-30: qty 100

## 2016-09-30 MED ORDER — CEFAZOLIN SODIUM-DEXTROSE 2-4 GM/100ML-% IV SOLN
INTRAVENOUS | Status: AC
  Filled 2016-09-30: qty 100

## 2016-09-30 MED ORDER — CEFAZOLIN SODIUM-DEXTROSE 2-4 GM/100ML-% IV SOLN
2.0000 g | Freq: Once | INTRAVENOUS | Status: AC
  Administered 2016-09-30: 2 g via INTRAVENOUS

## 2016-09-30 MED ORDER — PROPOFOL 10 MG/ML IV BOLUS
INTRAVENOUS | Status: AC
  Filled 2016-09-30: qty 20

## 2016-09-30 SURGICAL SUPPLY — 43 items
BANDAGE ACE 4X5 VEL STRL LF (GAUZE/BANDAGES/DRESSINGS) ×3 IMPLANT
BANDAGE ELASTIC 4 LF NS (GAUZE/BANDAGES/DRESSINGS) ×3 IMPLANT
BIT DRILL 2 MINI QC DISP (BIT) ×3 IMPLANT
BIT DRILL 2.5 CANN LNG (BIT) ×3 IMPLANT
CANISTER SUCT 1200ML W/VALVE (MISCELLANEOUS) ×3 IMPLANT
CHLORAPREP W/TINT 26ML (MISCELLANEOUS) ×3 IMPLANT
CUFF TOURN 18 STER (MISCELLANEOUS) IMPLANT
DRAPE FLUOR MINI C-ARM 54X84 (DRAPES) ×3 IMPLANT
ELECT CAUTERY BLADE 6.4 (BLADE) ×3 IMPLANT
ELECT REM PT RETURN 9FT ADLT (ELECTROSURGICAL) ×3
ELECTRODE REM PT RTRN 9FT ADLT (ELECTROSURGICAL) ×1 IMPLANT
GAUZE PETRO XEROFOAM 1X8 (MISCELLANEOUS) ×3 IMPLANT
GAUZE SPONGE 4X4 12PLY STRL (GAUZE/BANDAGES/DRESSINGS) ×3 IMPLANT
GLOVE SURG SYN 9.0  PF PI (GLOVE) ×2
GLOVE SURG SYN 9.0 PF PI (GLOVE) ×1 IMPLANT
GOWN SRG 2XL LVL 4 RGLN SLV (GOWNS) ×1 IMPLANT
GOWN STRL NON-REIN 2XL LVL4 (GOWNS) ×2
GOWN STRL REUS W/ TWL LRG LVL3 (GOWN DISPOSABLE) ×1 IMPLANT
GOWN STRL REUS W/TWL LRG LVL3 (GOWN DISPOSABLE) ×2
K-WIRE 2X5 SS THRDED S3 (WIRE) ×3
KIT RM TURNOVER STRD PROC AR (KITS) ×3 IMPLANT
KWIRE 2X5 SS THRDED S3 (WIRE) ×1 IMPLANT
NEEDLE FILTER BLUNT 18X 1/2SAF (NEEDLE) ×2
NEEDLE FILTER BLUNT 18X1 1/2 (NEEDLE) ×1 IMPLANT
NS IRRIG 500ML POUR BTL (IV SOLUTION) ×3 IMPLANT
PACK EXTREMITY ARMC (MISCELLANEOUS) ×3 IMPLANT
PAD CAST CTTN 4X4 STRL (SOFTGOODS) ×1 IMPLANT
PADDING CAST COTTON 4X4 STRL (SOFTGOODS) ×2
PEG SUBCHONDRAL SMOOTH 2.0X16 (Peg) ×3 IMPLANT
PEG SUBCHONDRAL SMOOTH 2.0X20 (Peg) ×3 IMPLANT
PEG SUBCHONDRAL SMOOTH 2.0X22 (Peg) ×6 IMPLANT
PLATE SHORT 24.4X51.3 LT (Plate) ×3 IMPLANT
SCREW BN 12X3.5XNS CORT TI (Screw) ×1 IMPLANT
SCREW CORT 3.5X10 LNG (Screw) ×9 IMPLANT
SCREW CORT 3.5X12 (Screw) ×2 IMPLANT
SCREW MULTI DIRECT 20MM (Screw) ×3 IMPLANT
SCREW MULTI DIRECT 22MM (Screw) ×3 IMPLANT
SPLINT CAST 1 STEP 3X12 (MISCELLANEOUS) ×3 IMPLANT
SUT ETHILON 4-0 (SUTURE) ×2
SUT ETHILON 4-0 FS2 18XMFL BLK (SUTURE) ×1
SUT VICRYL 3-0 27IN (SUTURE) ×3 IMPLANT
SUTURE ETHLN 4-0 FS2 18XMF BLK (SUTURE) ×1 IMPLANT
SYR 3ML LL SCALE MARK (SYRINGE) ×3 IMPLANT

## 2016-09-30 NOTE — Anesthesia Preprocedure Evaluation (Signed)
Anesthesia Evaluation  Patient identified by MRN, date of birth, ID band Patient awake    Reviewed: Allergy & Precautions, NPO status , Patient's Chart, lab work & pertinent test results  History of Anesthesia Complications Negative for: history of anesthetic complications  Airway Mallampati: II       Dental   Pulmonary neg COPD, former smoker,           Cardiovascular hypertension, Pt. on medications + Cardiac Stents  (-) Past MI and (-) CHF (-) dysrhythmias (-) Valvular Problems/Murmurs     Neuro/Psych neg Seizures    GI/Hepatic Neg liver ROS, neg GERD  ,  Endo/Other  Hypothyroidism   Renal/GU negative Renal ROS     Musculoskeletal   Abdominal   Peds  Hematology   Anesthesia Other Findings   Reproductive/Obstetrics                             Anesthesia Physical Anesthesia Plan  ASA: III  Anesthesia Plan: General   Post-op Pain Management:    Induction: Intravenous  PONV Risk Score and Plan:   Airway Management Planned: LMA  Additional Equipment:   Intra-op Plan:   Post-operative Plan:   Informed Consent: I have reviewed the patients History and Physical, chart, labs and discussed the procedure including the risks, benefits and alternatives for the proposed anesthesia with the patient or authorized representative who has indicated his/her understanding and acceptance.     Plan Discussed with:   Anesthesia Plan Comments:         Anesthesia Quick Evaluation

## 2016-09-30 NOTE — Anesthesia Procedure Notes (Signed)
Procedure Name: LMA Insertion Date/Time: 09/30/2016 2:24 PM Performed by: Jonna Clark Pre-anesthesia Checklist: Patient identified, Patient being monitored, Timeout performed, Emergency Drugs available and Suction available Patient Re-evaluated:Patient Re-evaluated prior to induction Oxygen Delivery Method: Circle system utilized Preoxygenation: Pre-oxygenation with 100% oxygen Induction Type: IV induction Ventilation: Mask ventilation without difficulty LMA: LMA inserted LMA Size: 3.5 Tube type: Oral Number of attempts: 1 Placement Confirmation: positive ETCO2 and breath sounds checked- equal and bilateral Tube secured with: Tape Dental Injury: Teeth and Oropharynx as per pre-operative assessment

## 2016-09-30 NOTE — Transfer of Care (Signed)
Immediate Anesthesia Transfer of Care Note  Patient: Lisa Martin  Procedure(s) Performed: Procedure(s): OPEN REDUCTION INTERNAL FIXATION (ORIF) WRIST FRACTURE (Left)  Patient Location: PACU  Anesthesia Type:General  Level of Consciousness: drowsy and patient cooperative  Airway & Oxygen Therapy: Patient Spontanous Breathing and Patient connected to face mask oxygen  Post-op Assessment: Report given to RN, Post -op Vital signs reviewed and stable and Patient moving all extremities X 4  Post vital signs: Reviewed and stable  Last Vitals:  Vitals:   09/30/16 1147  BP: (!) 148/66  Pulse: 70  Resp: 16  Temp: (!) 36.1 C  SpO2: 99%    Last Pain:  Vitals:   09/30/16 1147  TempSrc: Tympanic  PainSc: 7          Complications: No apparent anesthesia complications

## 2016-09-30 NOTE — H&P (Signed)
Reviewed paper H+P, will be scanned into chart. No changes noted.  

## 2016-09-30 NOTE — Discharge Instructions (Addendum)
Keep arm elevated, ice to back of wrist over the weekend and help with pain and swelling. Work fingers is much as possible. Pain medicine as directed. Keep dressing clean and dry  AMBULATORY SURGERY  DISCHARGE INSTRUCTIONS   1) The drugs that you were given will stay in your system until tomorrow so for the next 24 hours you should not:  A) Drive an automobile B) Make any legal decisions C) Drink any alcoholic beverage   2) You may resume regular meals tomorrow.  Today it is better to start with liquids and gradually work up to solid foods.  You may eat anything you prefer, but it is better to start with liquids, then soup and crackers, and gradually work up to solid foods.   3) Please notify your doctor immediately if you have any unusual bleeding, trouble breathing, redness and pain at the surgery site, drainage, fever, or pain not relieved by medication.    4) Additional Instructions:        Please contact your physician with any problems or Same Day Surgery at [phone], Monday through Friday 6 am to 4 pm, or [place] at [facility] number at [phone].

## 2016-09-30 NOTE — Op Note (Signed)
09/30/2016  3:38 PM  PATIENT:  Lisa Martin  83 y.o. female  PRE-OPERATIVE DIAGNOSIS:  OTHER CLOSED INTRA ARTICULAR FRACTURE OF DISTAL END OF LEFT RADIUS  POST-OPERATIVE DIAGNOSIS:  OTHER CLOSED INTRA ARTICULAR FRACTURE OF   PROCEDURE:  Procedure(s): OPEN REDUCTION INTERNAL FIXATION (ORIF) WRIST FRACTURE (Left)  SURGEON: Laurene Footman, MD  ASSISTANTS: None  ANESTHESIA:   general  EBL:  Total I/O In: 500 [I.V.:500] Out: 5 [Blood:5]  BLOOD ADMINISTERED:none  DRAINS: none   LOCAL MEDICATIONS USED:  NONE  SPECIMEN:  No Specimen  DISPOSITION OF SPECIMEN:  N/A  COUNTS:  YES  TOURNIQUET:  * Missing tourniquet times found for documented tourniquets in log:  [id number] *29 minutes at 250 murmurs mercury  IMPLANTS: [employer] short narrow DVR plate with multiple smooth pegs variable angle screws and cortical screws  DICTATION: .Dragon Dictation patient brought the operating room and after adequate anesthesia was obtained the left arm was prepped and draped in sterile fashion. After patient identification timeout procedures were completed fingertrap traction applied off of the table with about 7 a half pounds. This is placed through the index and middle fingers to get some partial reduction. The tourniquet was raised and a volar approach is made centered over the FCR tendon, tendon was retracted radially and the volar distal shaft exposed with the fracture showing and reactive comminution with multiple parts to the distal fragment with 3 large fragments. The brachioradialis was partially elevated off the radial styloid to allow for correction of the radial deviation of that fragment. With a freer elevator the fractures were placed in near anatomic alignment and a distal first approach was used getting the smooth pegs and in the more distal row variable angle screws so that they would not go into the joint into the distal fragment. Bringing the plate down volarly the volar tilt was  restored and radial inclination was near normal. 3 cortical screws then paid placed in the shaft and traction was removed. Under fluoroscopic views the fracture was stable with acceptable alignment. There was noticeable DRUJ degenerative arthritis already present. The wound was irrigated and tourniquet let down prior to closure with 3-0 Vicryl subcutaneously and 4-0 nylon for the skin. Xeroform 4 x 4 web roll volar splint and Ace wrap applied  PLAN OF CARE: Discharge to home after PACU  PATIENT DISPOSITION:  PACU - hemodynamically stable.

## 2016-09-30 NOTE — Anesthesia Post-op Follow-up Note (Signed)
Anesthesia QCDR form completed.        

## 2016-10-04 NOTE — Anesthesia Postprocedure Evaluation (Signed)
Anesthesia Post Note  Patient: Lisa Martin  Procedure(s) Performed: Procedure(s) (LRB): OPEN REDUCTION INTERNAL FIXATION (ORIF) WRIST FRACTURE (Left)  Patient location during evaluation: PACU Anesthesia Type: General Level of consciousness: awake and alert Pain management: pain level controlled Vital Signs Assessment: post-procedure vital signs reviewed and stable Respiratory status: spontaneous breathing, nonlabored ventilation, respiratory function stable and patient connected to nasal cannula oxygen Cardiovascular status: blood pressure returned to baseline and stable Postop Assessment: no signs of nausea or vomiting Anesthetic complications: no     Last Vitals:  Vitals:   09/30/16 1733 09/30/16 1801  BP: (!) 144/84 122/80  Pulse: (!) 118 (!) 105  Resp: 18 18  Temp: 36.9 C   SpO2: 96% 95%    Last Pain:  Vitals:   09/30/16 1801  TempSrc:   PainSc: 3                  Martha Clan

## 2016-10-05 DIAGNOSIS — S52552D Other extraarticular fracture of lower end of left radius, subsequent encounter for closed fracture with routine healing: Secondary | ICD-10-CM | POA: Diagnosis not present

## 2016-10-05 DIAGNOSIS — S52692D Other fracture of lower end of left ulna, subsequent encounter for closed fracture with routine healing: Secondary | ICD-10-CM | POA: Diagnosis not present

## 2016-10-17 DIAGNOSIS — Z8781 Personal history of (healed) traumatic fracture: Secondary | ICD-10-CM | POA: Diagnosis not present

## 2016-10-17 DIAGNOSIS — Z967 Presence of other bone and tendon implants: Secondary | ICD-10-CM | POA: Diagnosis not present

## 2016-11-09 DIAGNOSIS — Z8781 Personal history of (healed) traumatic fracture: Secondary | ICD-10-CM | POA: Diagnosis not present

## 2016-11-09 DIAGNOSIS — Z967 Presence of other bone and tendon implants: Secondary | ICD-10-CM | POA: Diagnosis not present

## 2016-12-01 DIAGNOSIS — Z967 Presence of other bone and tendon implants: Secondary | ICD-10-CM | POA: Diagnosis not present

## 2016-12-01 DIAGNOSIS — Z8781 Personal history of (healed) traumatic fracture: Secondary | ICD-10-CM | POA: Diagnosis not present

## 2017-02-07 DIAGNOSIS — Z967 Presence of other bone and tendon implants: Secondary | ICD-10-CM | POA: Diagnosis not present

## 2017-02-07 DIAGNOSIS — S52552K Other extraarticular fracture of lower end of left radius, subsequent encounter for closed fracture with nonunion: Secondary | ICD-10-CM | POA: Diagnosis not present

## 2017-02-07 DIAGNOSIS — Z8781 Personal history of (healed) traumatic fracture: Secondary | ICD-10-CM | POA: Diagnosis not present

## 2017-03-02 DIAGNOSIS — S52552K Other extraarticular fracture of lower end of left radius, subsequent encounter for closed fracture with nonunion: Secondary | ICD-10-CM | POA: Diagnosis not present

## 2017-03-22 DIAGNOSIS — S52552K Other extraarticular fracture of lower end of left radius, subsequent encounter for closed fracture with nonunion: Secondary | ICD-10-CM | POA: Diagnosis not present

## 2017-04-10 DIAGNOSIS — R05 Cough: Secondary | ICD-10-CM | POA: Diagnosis not present

## 2017-04-10 DIAGNOSIS — L299 Pruritus, unspecified: Secondary | ICD-10-CM | POA: Diagnosis not present

## 2017-04-12 DIAGNOSIS — L508 Other urticaria: Secondary | ICD-10-CM | POA: Diagnosis not present

## 2017-05-11 ENCOUNTER — Other Ambulatory Visit: Payer: Self-pay

## 2017-05-11 DIAGNOSIS — Z1231 Encounter for screening mammogram for malignant neoplasm of breast: Secondary | ICD-10-CM

## 2017-05-22 DIAGNOSIS — S52552K Other extraarticular fracture of lower end of left radius, subsequent encounter for closed fracture with nonunion: Secondary | ICD-10-CM | POA: Diagnosis not present

## 2017-06-13 DIAGNOSIS — I10 Essential (primary) hypertension: Secondary | ICD-10-CM | POA: Diagnosis not present

## 2017-06-13 DIAGNOSIS — M542 Cervicalgia: Secondary | ICD-10-CM | POA: Diagnosis not present

## 2017-06-28 DIAGNOSIS — E538 Deficiency of other specified B group vitamins: Secondary | ICD-10-CM | POA: Diagnosis not present

## 2017-06-28 DIAGNOSIS — Z79899 Other long term (current) drug therapy: Secondary | ICD-10-CM | POA: Diagnosis not present

## 2017-06-28 DIAGNOSIS — M818 Other osteoporosis without current pathological fracture: Secondary | ICD-10-CM | POA: Diagnosis not present

## 2017-06-28 DIAGNOSIS — E039 Hypothyroidism, unspecified: Secondary | ICD-10-CM | POA: Diagnosis not present

## 2017-07-04 ENCOUNTER — Ambulatory Visit
Admission: RE | Admit: 2017-07-04 | Discharge: 2017-07-04 | Disposition: A | Discharge status: Home / Self Care | Payer: PPO | Source: Ambulatory Visit | Attending: General Surgery | Admitting: General Surgery

## 2017-07-04 DIAGNOSIS — Z1231 Encounter for screening mammogram for malignant neoplasm of breast: Secondary | ICD-10-CM | POA: Insufficient documentation

## 2017-07-05 DIAGNOSIS — E039 Hypothyroidism, unspecified: Secondary | ICD-10-CM | POA: Diagnosis not present

## 2017-07-05 DIAGNOSIS — Z0000 Encounter for general adult medical examination without abnormal findings: Secondary | ICD-10-CM | POA: Diagnosis not present

## 2017-07-05 DIAGNOSIS — R739 Hyperglycemia, unspecified: Secondary | ICD-10-CM | POA: Diagnosis not present

## 2017-07-05 DIAGNOSIS — M818 Other osteoporosis without current pathological fracture: Secondary | ICD-10-CM | POA: Diagnosis not present

## 2017-07-05 DIAGNOSIS — E538 Deficiency of other specified B group vitamins: Secondary | ICD-10-CM | POA: Diagnosis not present

## 2017-07-05 DIAGNOSIS — F330 Major depressive disorder, recurrent, mild: Secondary | ICD-10-CM | POA: Diagnosis not present

## 2017-07-11 ENCOUNTER — Encounter: Payer: Self-pay | Admitting: General Surgery

## 2017-07-11 ENCOUNTER — Ambulatory Visit (INDEPENDENT_AMBULATORY_CARE_PROVIDER_SITE_OTHER): Payer: PPO | Admitting: General Surgery

## 2017-07-11 VITALS — BP 130/72 | HR 69 | Resp 14 | Ht 60.0 in | Wt 118.0 lb

## 2017-07-11 DIAGNOSIS — Z853 Personal history of malignant neoplasm of breast: Secondary | ICD-10-CM

## 2017-07-11 NOTE — Patient Instructions (Addendum)
The patient is aware to call back for any questions or concerns. Patient will be asked to return to the office in one year with a bilateral screening mammogram. 
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
## 2017-08-11 DIAGNOSIS — M810 Age-related osteoporosis without current pathological fracture: Secondary | ICD-10-CM | POA: Diagnosis not present

## 2017-08-14 DIAGNOSIS — R5382 Chronic fatigue, unspecified: Secondary | ICD-10-CM | POA: Diagnosis not present

## 2017-08-14 DIAGNOSIS — L299 Pruritus, unspecified: Secondary | ICD-10-CM | POA: Diagnosis not present

## 2017-08-14 DIAGNOSIS — F5104 Psychophysiologic insomnia: Secondary | ICD-10-CM | POA: Diagnosis not present

## 2017-08-21 DIAGNOSIS — S52552K Other extraarticular fracture of lower end of left radius, subsequent encounter for closed fracture with nonunion: Secondary | ICD-10-CM | POA: Diagnosis not present

## 2017-08-21 DIAGNOSIS — S52692K Other fracture of lower end of left ulna, subsequent encounter for closed fracture with nonunion: Secondary | ICD-10-CM | POA: Diagnosis not present

## 2017-08-24 DIAGNOSIS — L299 Pruritus, unspecified: Secondary | ICD-10-CM | POA: Diagnosis not present

## 2017-08-24 DIAGNOSIS — K219 Gastro-esophageal reflux disease without esophagitis: Secondary | ICD-10-CM | POA: Diagnosis not present

## 2017-09-08 DIAGNOSIS — R42 Dizziness and giddiness: Secondary | ICD-10-CM | POA: Diagnosis not present

## 2017-09-08 DIAGNOSIS — I10 Essential (primary) hypertension: Secondary | ICD-10-CM | POA: Diagnosis not present

## 2017-09-08 DIAGNOSIS — E039 Hypothyroidism, unspecified: Secondary | ICD-10-CM | POA: Diagnosis not present

## 2017-10-23 DIAGNOSIS — R110 Nausea: Secondary | ICD-10-CM | POA: Diagnosis not present

## 2017-10-23 DIAGNOSIS — R5383 Other fatigue: Secondary | ICD-10-CM | POA: Diagnosis not present

## 2017-10-23 DIAGNOSIS — R531 Weakness: Secondary | ICD-10-CM | POA: Diagnosis not present

## 2017-10-23 DIAGNOSIS — R739 Hyperglycemia, unspecified: Secondary | ICD-10-CM | POA: Diagnosis not present

## 2017-12-06 ENCOUNTER — Other Ambulatory Visit: Payer: Self-pay | Admitting: Internal Medicine

## 2017-12-06 DIAGNOSIS — N6310 Unspecified lump in the right breast, unspecified quadrant: Secondary | ICD-10-CM | POA: Diagnosis not present

## 2017-12-06 DIAGNOSIS — N6320 Unspecified lump in the left breast, unspecified quadrant: Secondary | ICD-10-CM | POA: Diagnosis not present

## 2017-12-06 DIAGNOSIS — R0602 Shortness of breath: Secondary | ICD-10-CM | POA: Diagnosis not present

## 2017-12-06 DIAGNOSIS — R29818 Other symptoms and signs involving the nervous system: Secondary | ICD-10-CM | POA: Diagnosis not present

## 2017-12-06 DIAGNOSIS — G9349 Other encephalopathy: Secondary | ICD-10-CM

## 2017-12-11 DIAGNOSIS — R0602 Shortness of breath: Secondary | ICD-10-CM | POA: Diagnosis not present

## 2017-12-13 ENCOUNTER — Ambulatory Visit
Admission: RE | Admit: 2017-12-13 | Discharge: 2017-12-13 | Disposition: A | Discharge status: Home / Self Care | Payer: PPO | Source: Ambulatory Visit | Attending: Internal Medicine | Admitting: Internal Medicine

## 2017-12-13 DIAGNOSIS — R42 Dizziness and giddiness: Secondary | ICD-10-CM | POA: Diagnosis not present

## 2017-12-13 DIAGNOSIS — R29818 Other symptoms and signs involving the nervous system: Secondary | ICD-10-CM | POA: Insufficient documentation

## 2017-12-13 DIAGNOSIS — G9349 Other encephalopathy: Secondary | ICD-10-CM

## 2017-12-13 DIAGNOSIS — R5383 Other fatigue: Secondary | ICD-10-CM | POA: Diagnosis not present

## 2017-12-13 LAB — POCT I-STAT CREATININE: Creatinine, Ser: 0.8 mg/dL (ref 0.44–1.00)

## 2017-12-13 MED ORDER — GADOBUTROL 1 MMOL/ML IV SOLN
5.5000 mL | Freq: Once | INTRAVENOUS | Status: AC | PRN
  Administered 2017-12-13: 5.5 mL via INTRAVENOUS

## 2017-12-14 DIAGNOSIS — N6310 Unspecified lump in the right breast, unspecified quadrant: Secondary | ICD-10-CM | POA: Diagnosis not present

## 2017-12-14 DIAGNOSIS — R634 Abnormal weight loss: Secondary | ICD-10-CM | POA: Diagnosis not present

## 2017-12-14 DIAGNOSIS — I679 Cerebrovascular disease, unspecified: Secondary | ICD-10-CM | POA: Diagnosis not present

## 2017-12-14 DIAGNOSIS — N6320 Unspecified lump in the left breast, unspecified quadrant: Secondary | ICD-10-CM | POA: Diagnosis not present

## 2017-12-14 DIAGNOSIS — I6521 Occlusion and stenosis of right carotid artery: Secondary | ICD-10-CM | POA: Diagnosis not present

## 2017-12-15 ENCOUNTER — Other Ambulatory Visit: Payer: Self-pay | Admitting: Internal Medicine

## 2017-12-15 DIAGNOSIS — N6320 Unspecified lump in the left breast, unspecified quadrant: Principal | ICD-10-CM

## 2017-12-15 DIAGNOSIS — N6310 Unspecified lump in the right breast, unspecified quadrant: Secondary | ICD-10-CM

## 2017-12-25 ENCOUNTER — Ambulatory Visit: Payer: PPO

## 2017-12-26 DIAGNOSIS — E039 Hypothyroidism, unspecified: Secondary | ICD-10-CM | POA: Diagnosis not present

## 2017-12-26 DIAGNOSIS — E538 Deficiency of other specified B group vitamins: Secondary | ICD-10-CM | POA: Diagnosis not present

## 2017-12-27 ENCOUNTER — Ambulatory Visit
Admission: RE | Admit: 2017-12-27 | Discharge: 2017-12-27 | Disposition: A | Discharge status: Home / Self Care | Payer: PPO | Source: Ambulatory Visit | Attending: Internal Medicine | Admitting: Internal Medicine

## 2017-12-27 DIAGNOSIS — N6310 Unspecified lump in the right breast, unspecified quadrant: Secondary | ICD-10-CM | POA: Insufficient documentation

## 2017-12-27 DIAGNOSIS — N6320 Unspecified lump in the left breast, unspecified quadrant: Secondary | ICD-10-CM | POA: Insufficient documentation

## 2017-12-27 DIAGNOSIS — R922 Inconclusive mammogram: Secondary | ICD-10-CM | POA: Diagnosis not present

## 2017-12-27 HISTORY — DX: Personal history of antineoplastic chemotherapy: Z92.21

## 2017-12-27 HISTORY — DX: Personal history of irradiation: Z92.3

## 2018-01-02 DIAGNOSIS — Z8679 Personal history of other diseases of the circulatory system: Secondary | ICD-10-CM | POA: Diagnosis not present

## 2018-01-02 DIAGNOSIS — I6521 Occlusion and stenosis of right carotid artery: Secondary | ICD-10-CM | POA: Diagnosis not present

## 2018-01-05 DIAGNOSIS — E538 Deficiency of other specified B group vitamins: Secondary | ICD-10-CM | POA: Diagnosis not present

## 2018-01-05 DIAGNOSIS — E440 Moderate protein-calorie malnutrition: Secondary | ICD-10-CM | POA: Diagnosis not present

## 2018-01-05 DIAGNOSIS — I700 Atherosclerosis of aorta: Secondary | ICD-10-CM | POA: Diagnosis not present

## 2018-01-05 DIAGNOSIS — E039 Hypothyroidism, unspecified: Secondary | ICD-10-CM | POA: Diagnosis not present

## 2018-02-15 DIAGNOSIS — S51852A Open bite of left forearm, initial encounter: Secondary | ICD-10-CM | POA: Diagnosis not present

## 2018-02-15 DIAGNOSIS — W5501XA Bitten by cat, initial encounter: Secondary | ICD-10-CM | POA: Diagnosis not present

## 2018-03-12 DIAGNOSIS — J701 Chronic and other pulmonary manifestations due to radiation: Secondary | ICD-10-CM | POA: Diagnosis not present

## 2018-03-12 DIAGNOSIS — E440 Moderate protein-calorie malnutrition: Secondary | ICD-10-CM | POA: Diagnosis not present

## 2018-03-12 DIAGNOSIS — S12090S Other displaced fracture of first cervical vertebra, sequela: Secondary | ICD-10-CM | POA: Diagnosis not present

## 2018-03-12 DIAGNOSIS — R55 Syncope and collapse: Secondary | ICD-10-CM | POA: Diagnosis not present

## 2018-03-12 DIAGNOSIS — I700 Atherosclerosis of aorta: Secondary | ICD-10-CM | POA: Diagnosis not present

## 2018-03-26 DIAGNOSIS — M791 Myalgia, unspecified site: Secondary | ICD-10-CM | POA: Diagnosis not present

## 2018-03-26 DIAGNOSIS — Z0000 Encounter for general adult medical examination without abnormal findings: Secondary | ICD-10-CM | POA: Diagnosis not present

## 2018-03-26 DIAGNOSIS — I700 Atherosclerosis of aorta: Secondary | ICD-10-CM | POA: Diagnosis not present

## 2018-03-26 DIAGNOSIS — E440 Moderate protein-calorie malnutrition: Secondary | ICD-10-CM | POA: Diagnosis not present

## 2018-03-26 DIAGNOSIS — I495 Sick sinus syndrome: Secondary | ICD-10-CM | POA: Diagnosis not present

## 2018-03-26 DIAGNOSIS — R55 Syncope and collapse: Secondary | ICD-10-CM | POA: Diagnosis not present

## 2018-04-10 DIAGNOSIS — I495 Sick sinus syndrome: Secondary | ICD-10-CM | POA: Diagnosis not present

## 2018-04-10 DIAGNOSIS — M791 Myalgia, unspecified site: Secondary | ICD-10-CM | POA: Diagnosis not present

## 2018-04-10 DIAGNOSIS — E440 Moderate protein-calorie malnutrition: Secondary | ICD-10-CM | POA: Diagnosis not present

## 2018-06-27 DIAGNOSIS — I10 Essential (primary) hypertension: Secondary | ICD-10-CM | POA: Diagnosis not present

## 2018-06-27 DIAGNOSIS — E538 Deficiency of other specified B group vitamins: Secondary | ICD-10-CM | POA: Diagnosis not present

## 2018-06-27 DIAGNOSIS — I679 Cerebrovascular disease, unspecified: Secondary | ICD-10-CM | POA: Diagnosis not present

## 2018-06-27 DIAGNOSIS — R531 Weakness: Secondary | ICD-10-CM | POA: Diagnosis not present

## 2018-06-27 DIAGNOSIS — E440 Moderate protein-calorie malnutrition: Secondary | ICD-10-CM | POA: Diagnosis not present

## 2018-06-27 DIAGNOSIS — R634 Abnormal weight loss: Secondary | ICD-10-CM | POA: Diagnosis not present

## 2018-07-06 DIAGNOSIS — E039 Hypothyroidism, unspecified: Secondary | ICD-10-CM | POA: Diagnosis not present

## 2018-07-06 DIAGNOSIS — E538 Deficiency of other specified B group vitamins: Secondary | ICD-10-CM | POA: Diagnosis not present

## 2018-07-06 DIAGNOSIS — E440 Moderate protein-calorie malnutrition: Secondary | ICD-10-CM | POA: Diagnosis not present

## 2018-07-10 ENCOUNTER — Emergency Department: Payer: PPO

## 2018-07-10 ENCOUNTER — Other Ambulatory Visit: Payer: Self-pay

## 2018-07-10 ENCOUNTER — Encounter: Payer: Self-pay | Admitting: Medical Oncology

## 2018-07-10 ENCOUNTER — Emergency Department
Admission: EM | Admit: 2018-07-10 | Discharge: 2018-07-10 | Disposition: A | Discharge status: Home / Self Care | Payer: PPO | Attending: Student in an Organized Health Care Education/Training Program | Admitting: Student in an Organized Health Care Education/Training Program

## 2018-07-10 DIAGNOSIS — S3992XA Unspecified injury of lower back, initial encounter: Secondary | ICD-10-CM | POA: Diagnosis not present

## 2018-07-10 DIAGNOSIS — Z853 Personal history of malignant neoplasm of breast: Secondary | ICD-10-CM | POA: Insufficient documentation

## 2018-07-10 DIAGNOSIS — S0101XA Laceration without foreign body of scalp, initial encounter: Secondary | ICD-10-CM | POA: Diagnosis not present

## 2018-07-10 DIAGNOSIS — Y9389 Activity, other specified: Secondary | ICD-10-CM | POA: Insufficient documentation

## 2018-07-10 DIAGNOSIS — S0003XA Contusion of scalp, initial encounter: Secondary | ICD-10-CM

## 2018-07-10 DIAGNOSIS — Z79899 Other long term (current) drug therapy: Secondary | ICD-10-CM | POA: Insufficient documentation

## 2018-07-10 DIAGNOSIS — W109XXA Fall (on) (from) unspecified stairs and steps, initial encounter: Secondary | ICD-10-CM | POA: Insufficient documentation

## 2018-07-10 DIAGNOSIS — M542 Cervicalgia: Secondary | ICD-10-CM | POA: Diagnosis not present

## 2018-07-10 DIAGNOSIS — S0990XA Unspecified injury of head, initial encounter: Secondary | ICD-10-CM

## 2018-07-10 DIAGNOSIS — Z87891 Personal history of nicotine dependence: Secondary | ICD-10-CM | POA: Diagnosis not present

## 2018-07-10 DIAGNOSIS — Y999 Unspecified external cause status: Secondary | ICD-10-CM | POA: Insufficient documentation

## 2018-07-10 DIAGNOSIS — S199XXA Unspecified injury of neck, initial encounter: Secondary | ICD-10-CM | POA: Diagnosis not present

## 2018-07-10 DIAGNOSIS — Y929 Unspecified place or not applicable: Secondary | ICD-10-CM | POA: Diagnosis not present

## 2018-07-10 DIAGNOSIS — Z955 Presence of coronary angioplasty implant and graft: Secondary | ICD-10-CM | POA: Insufficient documentation

## 2018-07-10 DIAGNOSIS — I10 Essential (primary) hypertension: Secondary | ICD-10-CM | POA: Diagnosis not present

## 2018-07-10 MED ORDER — TRAMADOL HCL 50 MG PO TABS
50.0000 mg | ORAL_TABLET | Freq: Once | ORAL | Status: AC
  Administered 2018-07-10: 50 mg via ORAL
  Filled 2018-07-10: qty 1

## 2018-07-10 MED ORDER — IBUPROFEN 400 MG PO TABS
400.0000 mg | ORAL_TABLET | Freq: Once | ORAL | Status: AC | PRN
  Administered 2018-07-10: 400 mg via ORAL
  Filled 2018-07-10: qty 1

## 2018-07-10 NOTE — ED Triage Notes (Signed)
Pt was going up steps, fell back and hit head. Hematoma noted. Pt reports pain to tail bone. Denies LOC, denies use of blood thinner. A/O x 4.

## 2018-07-10 NOTE — Discharge Instructions (Addendum)
Return to the emergency department if confusion or neurological changes.  Wash the area with soap and water daily.  Tylenol as needed for pain.

## 2018-07-10 NOTE — ED Notes (Signed)
See triage note  States she fell going up steps  [address] backwards  Having pain to lower back/tailbone    Also has hematoma to back of head
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
## 2018-07-13 ENCOUNTER — Ambulatory Visit
Admission: RE | Admit: 2018-07-13 | Discharge: 2018-07-13 | Disposition: A | Discharge status: Home / Self Care | Payer: PPO | Source: Ambulatory Visit | Attending: Internal Medicine | Admitting: Internal Medicine

## 2018-07-13 ENCOUNTER — Other Ambulatory Visit: Payer: Self-pay

## 2018-07-13 ENCOUNTER — Other Ambulatory Visit: Payer: Self-pay | Admitting: Internal Medicine

## 2018-07-13 DIAGNOSIS — G44319 Acute post-traumatic headache, not intractable: Secondary | ICD-10-CM | POA: Insufficient documentation

## 2018-07-13 DIAGNOSIS — Z0000 Encounter for general adult medical examination without abnormal findings: Secondary | ICD-10-CM | POA: Diagnosis not present

## 2018-07-13 DIAGNOSIS — E440 Moderate protein-calorie malnutrition: Secondary | ICD-10-CM | POA: Diagnosis not present

## 2018-07-13 DIAGNOSIS — E538 Deficiency of other specified B group vitamins: Secondary | ICD-10-CM | POA: Diagnosis not present

## 2018-07-13 DIAGNOSIS — R51 Headache: Secondary | ICD-10-CM | POA: Diagnosis not present

## 2018-07-13 DIAGNOSIS — I495 Sick sinus syndrome: Secondary | ICD-10-CM | POA: Diagnosis not present

## 2018-08-22 ENCOUNTER — Encounter: Payer: Self-pay | Admitting: General Surgery

## 2018-10-16 ENCOUNTER — Other Ambulatory Visit: Payer: Self-pay

## 2018-10-16 DIAGNOSIS — Z1231 Encounter for screening mammogram for malignant neoplasm of breast: Secondary | ICD-10-CM

## 2018-12-31 ENCOUNTER — Ambulatory Visit
Admission: RE | Admit: 2018-12-31 | Discharge: 2018-12-31 | Disposition: A | Discharge status: Home / Self Care | Payer: PPO | Source: Ambulatory Visit | Attending: Surgery | Admitting: Surgery

## 2018-12-31 DIAGNOSIS — Z1231 Encounter for screening mammogram for malignant neoplasm of breast: Secondary | ICD-10-CM | POA: Diagnosis not present

## 2019-01-07 DIAGNOSIS — E440 Moderate protein-calorie malnutrition: Secondary | ICD-10-CM | POA: Diagnosis not present

## 2019-01-07 DIAGNOSIS — R739 Hyperglycemia, unspecified: Secondary | ICD-10-CM | POA: Diagnosis not present

## 2019-01-07 DIAGNOSIS — E538 Deficiency of other specified B group vitamins: Secondary | ICD-10-CM | POA: Diagnosis not present

## 2019-01-08 ENCOUNTER — Ambulatory Visit: Payer: PPO | Admitting: Surgery

## 2019-01-09 ENCOUNTER — Encounter: Payer: Self-pay | Admitting: Surgery

## 2019-01-09 ENCOUNTER — Ambulatory Visit (INDEPENDENT_AMBULATORY_CARE_PROVIDER_SITE_OTHER): Payer: PPO | Admitting: Surgery

## 2019-01-09 ENCOUNTER — Other Ambulatory Visit: Payer: Self-pay

## 2019-01-09 VITALS — BP 162/79 | HR 69 | Temp 97.4°F | Ht 62.0 in | Wt 90.8 lb

## 2019-01-09 DIAGNOSIS — Z853 Personal history of malignant neoplasm of breast: Secondary | ICD-10-CM | POA: Diagnosis not present

## 2019-01-09 NOTE — Patient Instructions (Addendum)
Follow up with your primary care provider regarding weight loss and diet.   Try to increase your protein levels with three shakes a day and may slowly increase to 4-5 a day if not eating well.   Your exam was normal today.  You may get your annual mammograms through your primary care provider or GYN doctor.   Follow-up with our office as needed.  Please call and ask to speak with a nurse if you develop questions or concerns.

## 2019-01-11 ENCOUNTER — Encounter: Payer: Self-pay | Admitting: Surgery
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
## 2019-01-14 DIAGNOSIS — J701 Chronic and other pulmonary manifestations due to radiation: Secondary | ICD-10-CM | POA: Diagnosis not present

## 2019-01-14 DIAGNOSIS — M818 Other osteoporosis without current pathological fracture: Secondary | ICD-10-CM | POA: Diagnosis not present

## 2019-01-14 DIAGNOSIS — G301 Alzheimer's disease with late onset: Secondary | ICD-10-CM | POA: Diagnosis not present

## 2019-01-14 DIAGNOSIS — F0280 Dementia in other diseases classified elsewhere without behavioral disturbance: Secondary | ICD-10-CM | POA: Diagnosis not present

## 2019-01-14 DIAGNOSIS — I495 Sick sinus syndrome: Secondary | ICD-10-CM | POA: Diagnosis not present

## 2019-01-14 DIAGNOSIS — E538 Deficiency of other specified B group vitamins: Secondary | ICD-10-CM | POA: Diagnosis not present

## 2019-01-14 DIAGNOSIS — E039 Hypothyroidism, unspecified: Secondary | ICD-10-CM | POA: Diagnosis not present

## 2019-01-29 DIAGNOSIS — R110 Nausea: Secondary | ICD-10-CM | POA: Diagnosis not present

## 2019-01-29 DIAGNOSIS — R1013 Epigastric pain: Secondary | ICD-10-CM | POA: Diagnosis not present

## 2019-02-22 DIAGNOSIS — E876 Hypokalemia: Secondary | ICD-10-CM | POA: Diagnosis not present

## 2019-02-26 DIAGNOSIS — G301 Alzheimer's disease with late onset: Secondary | ICD-10-CM | POA: Diagnosis not present

## 2019-02-26 DIAGNOSIS — G8929 Other chronic pain: Secondary | ICD-10-CM | POA: Diagnosis not present

## 2019-02-26 DIAGNOSIS — I700 Atherosclerosis of aorta: Secondary | ICD-10-CM | POA: Diagnosis not present

## 2019-02-26 DIAGNOSIS — F0280 Dementia in other diseases classified elsewhere without behavioral disturbance: Secondary | ICD-10-CM | POA: Diagnosis not present

## 2019-02-26 DIAGNOSIS — I495 Sick sinus syndrome: Secondary | ICD-10-CM | POA: Diagnosis not present

## 2019-02-26 DIAGNOSIS — J701 Chronic and other pulmonary manifestations due to radiation: Secondary | ICD-10-CM | POA: Diagnosis not present

## 2019-02-26 DIAGNOSIS — R627 Adult failure to thrive: Secondary | ICD-10-CM | POA: Diagnosis not present

## 2019-02-26 DIAGNOSIS — E440 Moderate protein-calorie malnutrition: Secondary | ICD-10-CM | POA: Diagnosis not present

## 2019-02-26 DIAGNOSIS — R109 Unspecified abdominal pain: Secondary | ICD-10-CM | POA: Diagnosis not present

## 2019-06-12 DIAGNOSIS — H6123 Impacted cerumen, bilateral: Secondary | ICD-10-CM | POA: Diagnosis not present

## 2019-06-26 DIAGNOSIS — H3561 Retinal hemorrhage, right eye: Secondary | ICD-10-CM | POA: Diagnosis not present

## 2019-06-26 DIAGNOSIS — H25812 Combined forms of age-related cataract, left eye: Secondary | ICD-10-CM | POA: Diagnosis not present

## 2019-06-26 DIAGNOSIS — H50112 Monocular exotropia, left eye: Secondary | ICD-10-CM | POA: Diagnosis not present

## 2019-06-26 DIAGNOSIS — H5211 Myopia, right eye: Secondary | ICD-10-CM | POA: Diagnosis not present

## 2019-06-26 DIAGNOSIS — Z961 Presence of intraocular lens: Secondary | ICD-10-CM | POA: Diagnosis not present

## 2019-06-26 DIAGNOSIS — H52221 Regular astigmatism, right eye: Secondary | ICD-10-CM | POA: Diagnosis not present

## 2019-06-26 DIAGNOSIS — H43811 Vitreous degeneration, right eye: Secondary | ICD-10-CM | POA: Diagnosis not present

## 2019-07-09 DIAGNOSIS — E538 Deficiency of other specified B group vitamins: Secondary | ICD-10-CM | POA: Diagnosis not present

## 2019-07-09 DIAGNOSIS — E039 Hypothyroidism, unspecified: Secondary | ICD-10-CM | POA: Diagnosis not present

## 2019-07-09 DIAGNOSIS — M818 Other osteoporosis without current pathological fracture: Secondary | ICD-10-CM | POA: Diagnosis not present

## 2019-07-16 DIAGNOSIS — F0280 Dementia in other diseases classified elsewhere without behavioral disturbance: Secondary | ICD-10-CM | POA: Diagnosis not present

## 2019-07-16 DIAGNOSIS — Z0000 Encounter for general adult medical examination without abnormal findings: Secondary | ICD-10-CM | POA: Diagnosis not present

## 2019-07-16 DIAGNOSIS — G301 Alzheimer's disease with late onset: Secondary | ICD-10-CM | POA: Diagnosis not present

## 2019-07-16 DIAGNOSIS — I495 Sick sinus syndrome: Secondary | ICD-10-CM | POA: Diagnosis not present

## 2019-07-16 DIAGNOSIS — E538 Deficiency of other specified B group vitamins: Secondary | ICD-10-CM | POA: Diagnosis not present

## 2019-07-16 DIAGNOSIS — S12090S Other displaced fracture of first cervical vertebra, sequela: Secondary | ICD-10-CM | POA: Diagnosis not present

## 2019-07-16 DIAGNOSIS — E440 Moderate protein-calorie malnutrition: Secondary | ICD-10-CM | POA: Diagnosis not present

## 2019-07-16 DIAGNOSIS — J701 Chronic and other pulmonary manifestations due to radiation: Secondary | ICD-10-CM | POA: Diagnosis not present

## 2019-09-24 DIAGNOSIS — Z9889 Other specified postprocedural states: Secondary | ICD-10-CM | POA: Diagnosis not present

## 2019-09-24 DIAGNOSIS — Z981 Arthrodesis status: Secondary | ICD-10-CM | POA: Diagnosis not present

## 2019-09-24 DIAGNOSIS — M47816 Spondylosis without myelopathy or radiculopathy, lumbar region: Secondary | ICD-10-CM | POA: Diagnosis not present

## 2019-09-24 DIAGNOSIS — R202 Paresthesia of skin: Secondary | ICD-10-CM | POA: Diagnosis not present

## 2019-10-31 DIAGNOSIS — H43811 Vitreous degeneration, right eye: Secondary | ICD-10-CM | POA: Diagnosis not present

## 2019-10-31 DIAGNOSIS — H3561 Retinal hemorrhage, right eye: Secondary | ICD-10-CM | POA: Diagnosis not present

## 2019-10-31 DIAGNOSIS — Z961 Presence of intraocular lens: Secondary | ICD-10-CM | POA: Diagnosis not present

## 2019-10-31 DIAGNOSIS — H50112 Monocular exotropia, left eye: Secondary | ICD-10-CM | POA: Diagnosis not present

## 2020-01-10 DIAGNOSIS — E440 Moderate protein-calorie malnutrition: Secondary | ICD-10-CM | POA: Diagnosis not present

## 2020-01-10 DIAGNOSIS — E538 Deficiency of other specified B group vitamins: Secondary | ICD-10-CM | POA: Diagnosis not present

## 2020-01-21 IMAGING — CT CT HEAD WITHOUT CONTRAST
3 of 4 series · 14 of 47 positions shown, 16 images · non-contrast
Comparison: 07/10/2018

CLINICAL DATA: Posttraumatic headache

EXAM:
CT HEAD WITHOUT CONTRAST
TECHNIQUE: Contiguous axial images were obtained from the base of the skull
through the vertex without intravenous contrast.

[Series 4: head · axial · 0.35mm/px · z∈[-549,-441]mm · 8 of 68 slices shown, 10 images (1 of 3)]
[im 7/68  brain]
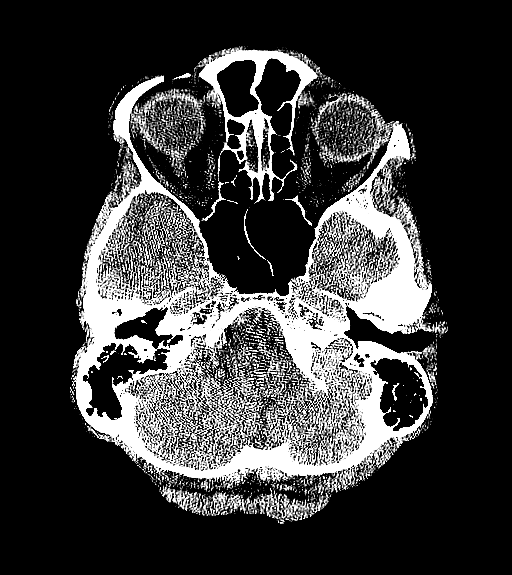
[im 7/68  bone]
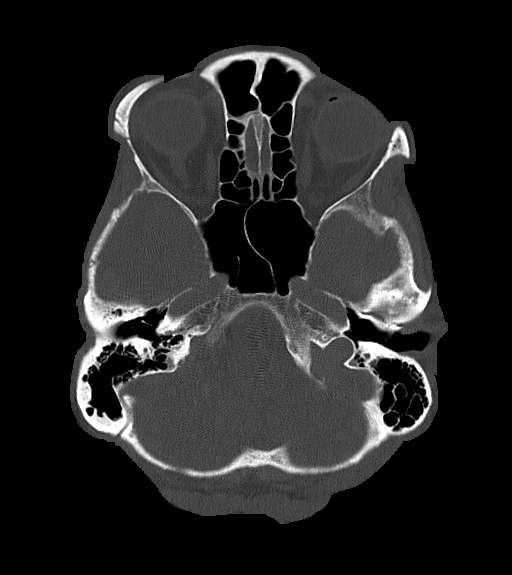
[im 14/68  brain]
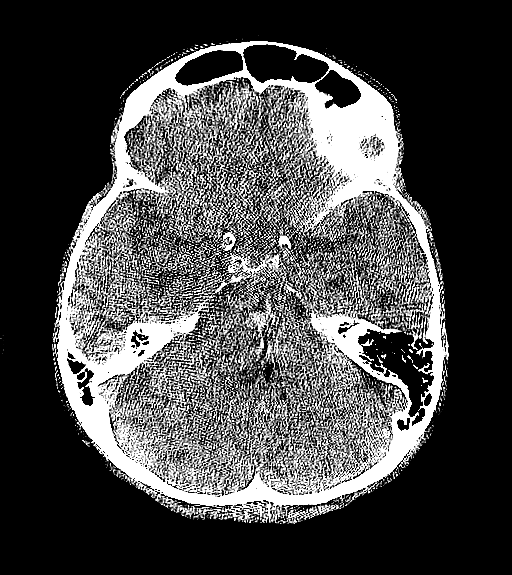
[im 21/68  brain]
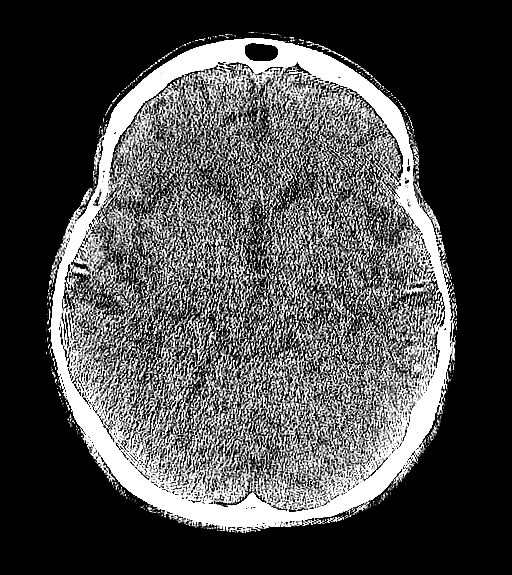
[im 31/68  brain]
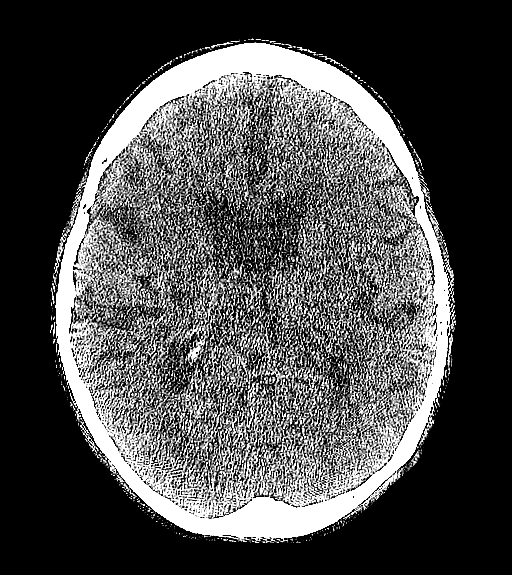
[im 37/68  brain]
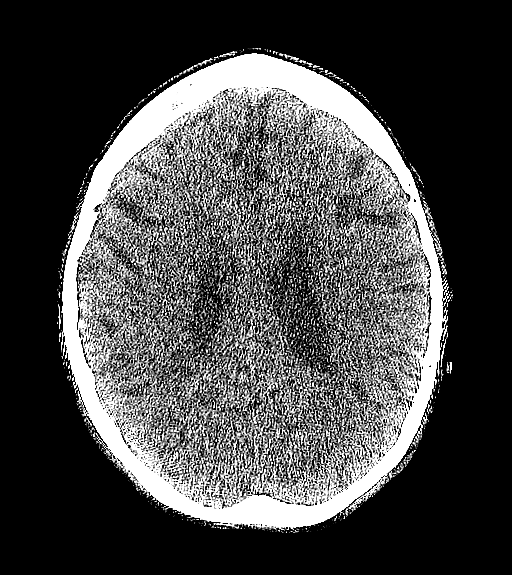
[im 37/68  bone]
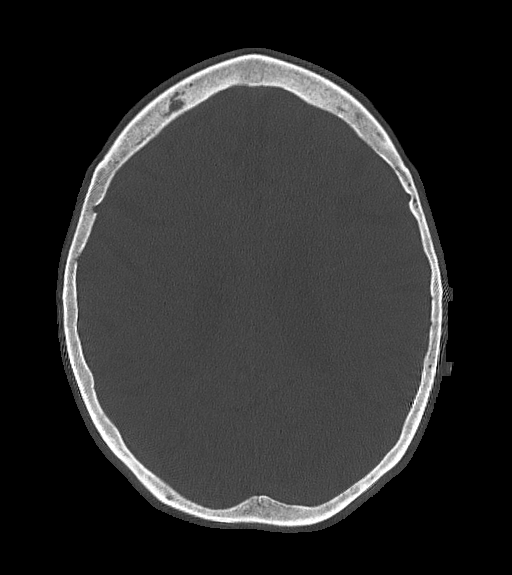
[im 47/68  brain]
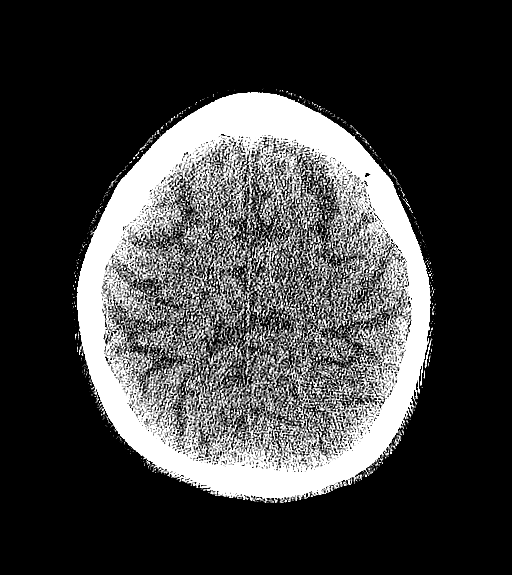
[im 54/68  brain]
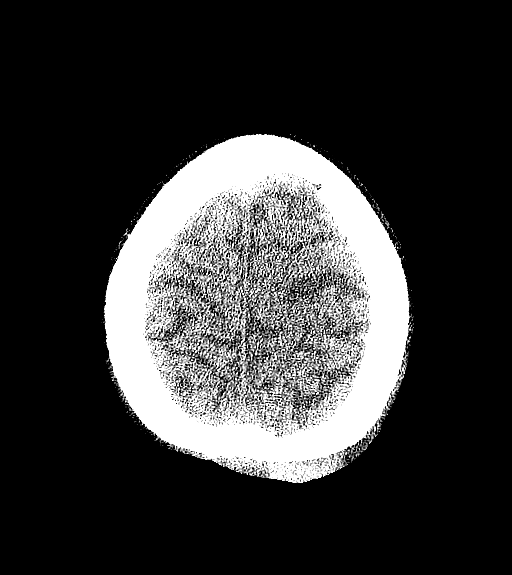
[im 61/68  brain]
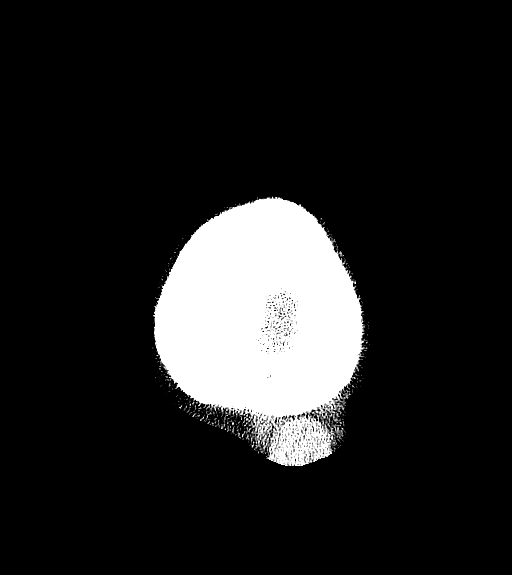

[Series 6: head · coronal · 0.27mm/px · 3 of 67 slices shown (2 of 3)]
[im 23/67  brain]
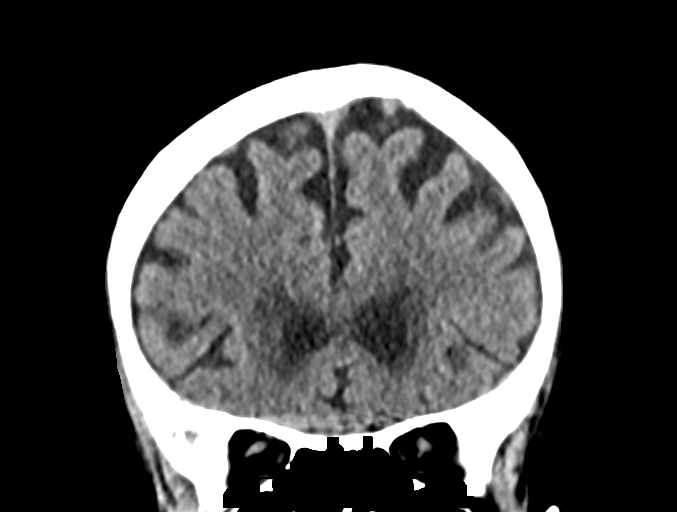
[im 30/67  brain]
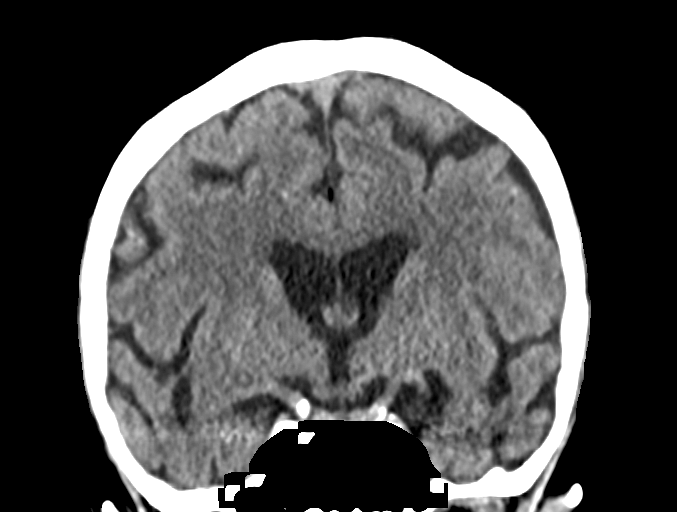
[im 37/67  brain]
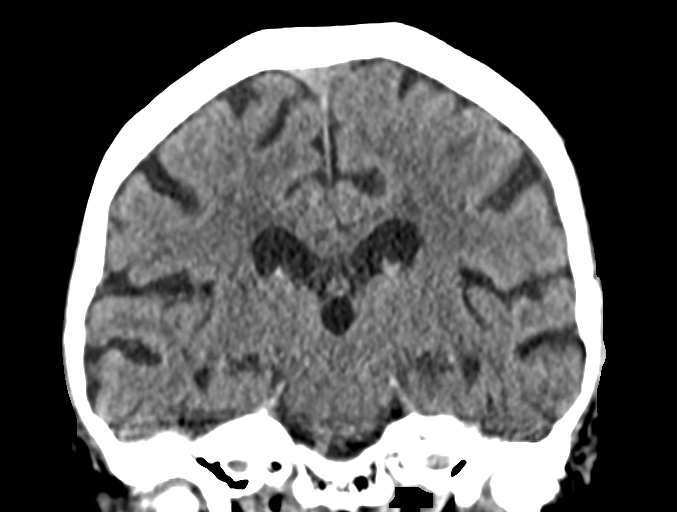

[Series 8: head · sagittal · 0.27mm/px · 3 of 60 slices shown (3 of 3)]
[im 20/60  brain]
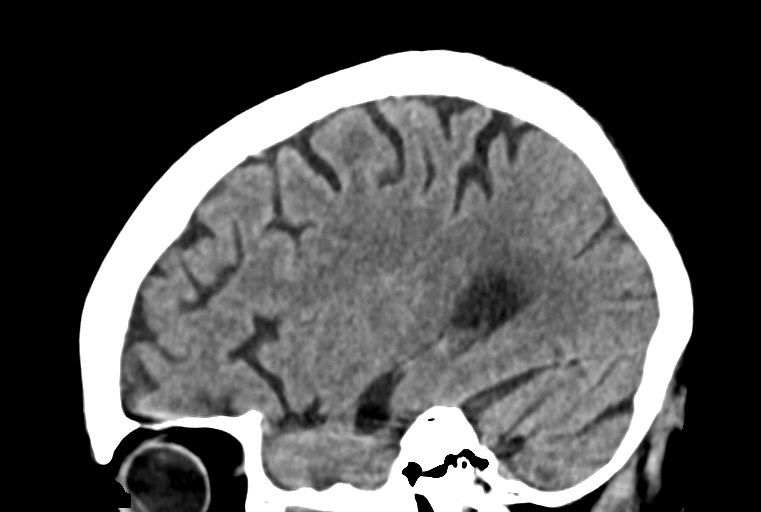
[im 30/60  brain]
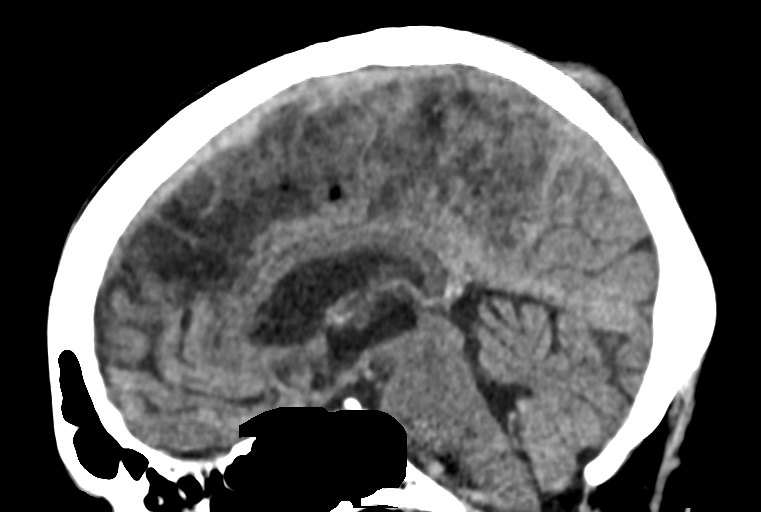
[im 40/60  brain]
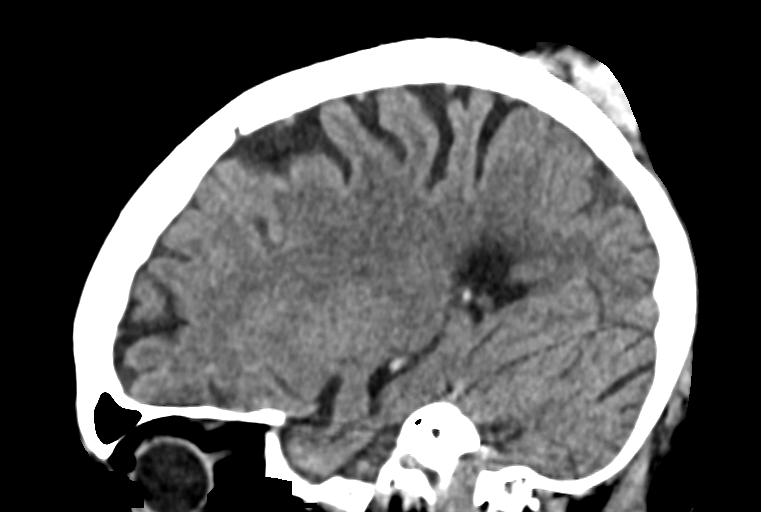

[14 of 47 positions shown; findings below may reference images not displayed]

FINDINGS: Brain: No evidence of acute infarction, hemorrhage, extra-axial
collection, ventriculomegaly, or mass effect. Generalized cerebral
atrophy. Periventricular white matter low attenuation likely
secondary to microangiopathy.

Vascular: Cerebrovascular atherosclerotic calcifications are noted.

Skull: Negative for fracture or focal lesion.

Sinuses/Orbits: Visualized portions of the orbits are unremarkable.
Visualized portions of the paranasal sinuses and mastoid air cells
are unremarkable.

Other: Large left parietal scalp hematoma.
IMPRESSION: 1. No acute intracranial pathology.
2. Large left parietal scalp hematoma.

## 2020-01-28 DIAGNOSIS — E039 Hypothyroidism, unspecified: Secondary | ICD-10-CM | POA: Diagnosis not present

## 2020-01-28 DIAGNOSIS — E538 Deficiency of other specified B group vitamins: Secondary | ICD-10-CM | POA: Diagnosis not present

## 2020-01-28 DIAGNOSIS — I495 Sick sinus syndrome: Secondary | ICD-10-CM | POA: Diagnosis not present

## 2020-01-28 DIAGNOSIS — F0280 Dementia in other diseases classified elsewhere without behavioral disturbance: Secondary | ICD-10-CM | POA: Diagnosis not present

## 2020-01-28 DIAGNOSIS — R202 Paresthesia of skin: Secondary | ICD-10-CM | POA: Diagnosis not present

## 2020-01-28 DIAGNOSIS — G301 Alzheimer's disease with late onset: Secondary | ICD-10-CM | POA: Diagnosis not present

## 2020-05-06 DIAGNOSIS — I495 Sick sinus syndrome: Secondary | ICD-10-CM | POA: Diagnosis not present

## 2020-05-06 DIAGNOSIS — G301 Alzheimer's disease with late onset: Secondary | ICD-10-CM | POA: Diagnosis not present

## 2020-05-06 DIAGNOSIS — F0280 Dementia in other diseases classified elsewhere without behavioral disturbance: Secondary | ICD-10-CM | POA: Diagnosis not present

## 2020-05-06 DIAGNOSIS — J701 Chronic and other pulmonary manifestations due to radiation: Secondary | ICD-10-CM | POA: Diagnosis not present

## 2020-05-06 DIAGNOSIS — L989 Disorder of the skin and subcutaneous tissue, unspecified: Secondary | ICD-10-CM | POA: Diagnosis not present

## 2020-05-11 DIAGNOSIS — D485 Neoplasm of uncertain behavior of skin: Secondary | ICD-10-CM | POA: Diagnosis not present

## 2020-05-11 DIAGNOSIS — C44622 Squamous cell carcinoma of skin of right upper limb, including shoulder: Secondary | ICD-10-CM | POA: Diagnosis not present

## 2020-06-24 ENCOUNTER — Emergency Department
Admission: EM | Admit: 2020-06-24 | Discharge: 2020-06-24 | Disposition: A | Discharge status: Home / Self Care | Payer: PPO | Attending: Emergency Medicine | Admitting: Emergency Medicine

## 2020-06-24 ENCOUNTER — Encounter: Payer: Self-pay | Admitting: Emergency Medicine

## 2020-06-24 ENCOUNTER — Other Ambulatory Visit: Payer: Self-pay

## 2020-06-24 ENCOUNTER — Emergency Department: Payer: PPO

## 2020-06-24 DIAGNOSIS — I10 Essential (primary) hypertension: Secondary | ICD-10-CM | POA: Diagnosis not present

## 2020-06-24 DIAGNOSIS — M542 Cervicalgia: Secondary | ICD-10-CM | POA: Diagnosis not present

## 2020-06-24 DIAGNOSIS — S0990XA Unspecified injury of head, initial encounter: Secondary | ICD-10-CM | POA: Insufficient documentation

## 2020-06-24 DIAGNOSIS — M25561 Pain in right knee: Secondary | ICD-10-CM | POA: Diagnosis not present

## 2020-06-24 DIAGNOSIS — Z853 Personal history of malignant neoplasm of breast: Secondary | ICD-10-CM | POA: Insufficient documentation

## 2020-06-24 DIAGNOSIS — S62607A Fracture of unspecified phalanx of left little finger, initial encounter for closed fracture: Secondary | ICD-10-CM

## 2020-06-24 DIAGNOSIS — Z955 Presence of coronary angioplasty implant and graft: Secondary | ICD-10-CM | POA: Insufficient documentation

## 2020-06-24 DIAGNOSIS — S62617A Displaced fracture of proximal phalanx of left little finger, initial encounter for closed fracture: Secondary | ICD-10-CM | POA: Insufficient documentation

## 2020-06-24 DIAGNOSIS — Z79899 Other long term (current) drug therapy: Secondary | ICD-10-CM | POA: Insufficient documentation

## 2020-06-24 DIAGNOSIS — Z87891 Personal history of nicotine dependence: Secondary | ICD-10-CM | POA: Diagnosis not present

## 2020-06-24 DIAGNOSIS — Y9301 Activity, walking, marching and hiking: Secondary | ICD-10-CM | POA: Diagnosis not present

## 2020-06-24 DIAGNOSIS — R220 Localized swelling, mass and lump, head: Secondary | ICD-10-CM | POA: Diagnosis not present

## 2020-06-24 DIAGNOSIS — W108XXA Fall (on) (from) other stairs and steps, initial encounter: Secondary | ICD-10-CM | POA: Insufficient documentation

## 2020-06-24 DIAGNOSIS — F0390 Unspecified dementia without behavioral disturbance: Secondary | ICD-10-CM | POA: Insufficient documentation

## 2020-06-24 DIAGNOSIS — R519 Headache, unspecified: Secondary | ICD-10-CM | POA: Diagnosis not present

## 2020-06-24 DIAGNOSIS — S6992XA Unspecified injury of left wrist, hand and finger(s), initial encounter: Secondary | ICD-10-CM | POA: Diagnosis present

## 2020-06-24 HISTORY — DX: Unspecified dementia, unspecified severity, without behavioral disturbance, psychotic disturbance, mood disturbance, and anxiety: F03.90

## 2020-06-24 NOTE — ED Triage Notes (Addendum)
Fall.  Lost balance walking down stairs, fell hitting brick sidewalk.  No LOC.  Left facial swelling, laceration.  Bleeding controlled.  Also c/o left pinky finger pain and right knee.

## 2020-06-24 NOTE — Discharge Instructions (Addendum)
Continue to take Tylenol for pain.
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
## 2020-06-26 DIAGNOSIS — S6292XD Unspecified fracture of left wrist and hand, subsequent encounter for fracture with routine healing: Secondary | ICD-10-CM | POA: Diagnosis not present

## 2020-06-26 DIAGNOSIS — L03211 Cellulitis of face: Secondary | ICD-10-CM | POA: Diagnosis not present

## 2020-06-26 DIAGNOSIS — I700 Atherosclerosis of aorta: Secondary | ICD-10-CM | POA: Diagnosis not present

## 2020-07-22 DIAGNOSIS — E039 Hypothyroidism, unspecified: Secondary | ICD-10-CM | POA: Diagnosis not present

## 2020-07-22 DIAGNOSIS — I495 Sick sinus syndrome: Secondary | ICD-10-CM | POA: Diagnosis not present

## 2020-07-22 DIAGNOSIS — E538 Deficiency of other specified B group vitamins: Secondary | ICD-10-CM | POA: Diagnosis not present

## 2020-07-28 DIAGNOSIS — G301 Alzheimer's disease with late onset: Secondary | ICD-10-CM | POA: Diagnosis not present

## 2020-07-28 DIAGNOSIS — I495 Sick sinus syndrome: Secondary | ICD-10-CM | POA: Diagnosis not present

## 2020-07-28 DIAGNOSIS — F0280 Dementia in other diseases classified elsewhere without behavioral disturbance: Secondary | ICD-10-CM | POA: Diagnosis not present

## 2020-07-28 DIAGNOSIS — E039 Hypothyroidism, unspecified: Secondary | ICD-10-CM | POA: Diagnosis not present

## 2020-07-28 DIAGNOSIS — Z0000 Encounter for general adult medical examination without abnormal findings: Secondary | ICD-10-CM | POA: Diagnosis not present

## 2020-07-28 DIAGNOSIS — E538 Deficiency of other specified B group vitamins: Secondary | ICD-10-CM | POA: Diagnosis not present

## 2020-09-15 DIAGNOSIS — Z111 Encounter for screening for respiratory tuberculosis: Secondary | ICD-10-CM | POA: Diagnosis not present

## 2021-01-19 DIAGNOSIS — I10 Essential (primary) hypertension: Secondary | ICD-10-CM | POA: Diagnosis not present

## 2021-01-19 DIAGNOSIS — E039 Hypothyroidism, unspecified: Secondary | ICD-10-CM | POA: Diagnosis not present

## 2021-01-19 DIAGNOSIS — F0280 Dementia in other diseases classified elsewhere without behavioral disturbance: Secondary | ICD-10-CM | POA: Diagnosis not present

## 2021-01-19 DIAGNOSIS — G301 Alzheimer's disease with late onset: Secondary | ICD-10-CM | POA: Diagnosis not present

## 2021-01-19 DIAGNOSIS — E538 Deficiency of other specified B group vitamins: Secondary | ICD-10-CM | POA: Diagnosis not present

## 2021-02-05 DIAGNOSIS — M5414 Radiculopathy, thoracic region: Secondary | ICD-10-CM | POA: Diagnosis not present

## 2021-02-05 DIAGNOSIS — M542 Cervicalgia: Secondary | ICD-10-CM | POA: Diagnosis not present

## 2021-02-05 DIAGNOSIS — S22060A Wedge compression fracture of T7-T8 vertebra, initial encounter for closed fracture: Secondary | ICD-10-CM | POA: Diagnosis not present

## 2021-02-06 DIAGNOSIS — M546 Pain in thoracic spine: Secondary | ICD-10-CM | POA: Diagnosis not present

## 2021-02-09 ENCOUNTER — Emergency Department
Admission: EM | Admit: 2021-02-09 | Discharge: 2021-02-09 | Disposition: A | Discharge status: Home / Self Care | Payer: PPO | Attending: Emergency Medicine | Admitting: Emergency Medicine

## 2021-02-09 ENCOUNTER — Emergency Department: Payer: PPO

## 2021-02-09 ENCOUNTER — Other Ambulatory Visit: Payer: Self-pay

## 2021-02-09 DIAGNOSIS — S299XXA Unspecified injury of thorax, initial encounter: Secondary | ICD-10-CM | POA: Diagnosis present

## 2021-02-09 DIAGNOSIS — X58XXXA Exposure to other specified factors, initial encounter: Secondary | ICD-10-CM | POA: Insufficient documentation

## 2021-02-09 DIAGNOSIS — Z853 Personal history of malignant neoplasm of breast: Secondary | ICD-10-CM | POA: Insufficient documentation

## 2021-02-09 DIAGNOSIS — I10 Essential (primary) hypertension: Secondary | ICD-10-CM | POA: Diagnosis not present

## 2021-02-09 DIAGNOSIS — F0390 Unspecified dementia without behavioral disturbance: Secondary | ICD-10-CM | POA: Insufficient documentation

## 2021-02-09 DIAGNOSIS — S22080A Wedge compression fracture of T11-T12 vertebra, initial encounter for closed fracture: Secondary | ICD-10-CM | POA: Diagnosis not present

## 2021-02-09 DIAGNOSIS — S22068A Other fracture of T7-T8 thoracic vertebra, initial encounter for closed fracture: Secondary | ICD-10-CM | POA: Diagnosis not present

## 2021-02-09 DIAGNOSIS — S22000A Wedge compression fracture of unspecified thoracic vertebra, initial encounter for closed fracture: Secondary | ICD-10-CM

## 2021-02-09 DIAGNOSIS — M546 Pain in thoracic spine: Secondary | ICD-10-CM | POA: Diagnosis not present

## 2021-02-09 MED ORDER — ONDANSETRON 4 MG PO TBDP
4.0000 mg | ORAL_TABLET | Freq: Once | ORAL | Status: AC
  Administered 2021-02-09: 4 mg via ORAL
  Filled 2021-02-09: qty 1

## 2021-02-09 MED ORDER — LIDOCAINE 5 % EX PTCH: 1.0000 | MEDICATED_PATCH | Freq: Two times a day (BID) | CUTANEOUS | 0 refills | Status: DC

## 2021-02-09 MED ORDER — HYDROCODONE-ACETAMINOPHEN 5-325 MG PO TABS: 1.0000 | ORAL_TABLET | Freq: Four times a day (QID) | ORAL | 0 refills | Status: DC | PRN

## 2021-02-09 MED ORDER — MORPHINE SULFATE (PF) 2 MG/ML IV SOLN
2.0000 mg | Freq: Once | INTRAVENOUS | Status: AC
  Administered 2021-02-09: 2 mg via INTRAMUSCULAR
  Filled 2021-02-09: qty 1

## 2021-02-09 MED ORDER — LIDOCAINE 5 % EX PTCH
1.0000 | MEDICATED_PATCH | CUTANEOUS | Status: DC
  Administered 2021-02-09: 1 via TRANSDERMAL
  Filled 2021-02-09: qty 1

## 2021-02-09 NOTE — ED Notes (Signed)
See triage note  presents with back pain  states pain started last pm  no injury

## 2021-02-09 NOTE — ED Provider Notes (Signed)
[facility] Provider Note    Event Date/Time   First MD Initiated Contact with Patient 02/09/21 [phone]     (approximate)   History   Back Pain   HPI  ROBERTA ANGELL is a 88 y.o. female with history of thoracic surgery many years ago, breast cancer, dementia and hypertension presents with midthoracic pain.  Daughter states that she has had pain for about a week.  They gave her Tylenol at home.  She denies any chest pain or shortness of breath.  No dizziness.      Physical Exam   Triage Vital Signs: ED Triage Vitals  Enc Vitals Group     BP 02/09/21 0550 (!) 199/103     Pulse Rate 02/09/21 0550 76     Resp 02/09/21 0550 16     Temp 02/09/21 0550 97.7 F (36.5 C)     Temp Source 02/09/21 0550 Oral     SpO2 02/09/21 0550 95 %     Weight 02/09/21 0548 95 lb (43.1 kg)     Height 02/09/21 0548 5\' 2"  (1.575 m)     Head Circumference --      Peak Flow --      Pain Score 02/09/21 0547 10     Pain Loc --      Pain Edu? --      Excl. in [place]? --     Most recent vital signs: Vitals:   02/09/21 0550 02/09/21 0804  BP: (!) 199/103 (!) 172/101  Pulse: 76 68  Resp: 16 17  Temp: 97.7 F (36.5 C) 97.8 F (36.6 C)  SpO2: 95% 96%     General: Awake, no distress.   CV:  Good peripheral perfusion.   Resp:  Normal effort.   Abd:  No distention.   Other:  Thoracic spine tender on palpation, no bruising or swelling noted   ED Results / Procedures / Treatments   Labs (all labs ordered are listed, but only abnormal results are displayed) Labs Reviewed - No data to display   EKG     RADIOLOGY  CT thoracic spine   PROCEDURES:  Critical Care performed: No  Procedures   MEDICATIONS ORDERED IN ED: Medications  lidocaine (LIDODERM) 5 % 1 patch (has no administration in time range)  morphine 2 MG/ML injection 2 mg (2 mg Intramuscular Given 02/09/21 0736)  ondansetron (ZOFRAN-ODT) disintegrating tablet 4 mg (4 mg Oral Given 02/09/21 0736)      IMPRESSION / MDM / ASSESSMENT AND PLAN / ED COURSE  I reviewed the triage vital signs and the nursing notes.                              Differential diagnosis includes, but is not limited to, compression fracture, metastatic disease, bulging disc, dissection  The patient is an 88 year old female presents with thoracic pain.  She has had a history of this and has continued to get worse over the past week.  She had an injection for back pain in the past 6 months.  Daughter states she did have an x-ray last week but they have not heard the results.  Did explain to the daughter that due to her age, history of breast cancer, and prior surgery to the thoracic spine we will do a CT of the thoracic spine as this will give [place] more information.  She is in agreement with treatment plan.  Patient was given  2 of morphine IM and Zofran ODT  Patient did have relief with pain medication.  CT of the thoracic spine shows a compression fracture at T7.  I did review this myself.  It was confirmed by radiology.  Did explain the findings to the daughter and the patient.  She was given a Lidoderm patch prior to discharge.  Prescription for Lidoderm patches and hydrocodone.  Strict precautions for the daughter to watch her closely as due to the narcotic she could become drowsy and fall.  They are to follow-up with Dr. Rudene Christians to see if he can fix the compression fracture as she has 60% height loss.  They are in agreement treatment plan.  She was discharged stable condition.       FINAL CLINICAL IMPRESSION(S) / ED DIAGNOSES   Final diagnoses:  Compression fracture of body of thoracic vertebra ([place])     Rx / DC Orders   ED Discharge Orders          Ordered    HYDROcodone-acetaminophen (NORCO/VICODIN) 5-325 MG tablet  Every 6 hours PRN        02/09/21 0824    lidocaine (LIDODERM) 5 %  Every 12 hours        02/09/21 0824             Note:  This document was prepared using Dragon voice recognition  software and may include unintentional dictation errors.    Versie Starks, PA-C 02/09/21 Marijo Conception    Lavonia Drafts, MD 02/09/21 [phone]

## 2021-02-09 NOTE — ED Triage Notes (Addendum)
Pt arrived via pov from home where she lives with daughter. Pt reports back pain  along spine in lumbar thoracic region, tender to touch, starting yesterday, denies any CP or recent falls or injury. Pt reports received a shot in back for pain in last 6 months. Pt having trouble getting comfortable in seated position.pt daughter states she took 2 tylenol and tramadol at 0400

## 2021-02-09 NOTE — Discharge Instructions (Addendum)
Follow-up with Dr. Rudene Christians.  Please call for an appointment.  If she is not having pain after using the Lidoderm patch, refrain from giving her the hydrocodone as she will become constipated.  If she has to take the hydrocodone then please give her a stool softener such as Senokot or MiraLAX.  Return emergency department for worsening

## 2021-02-10 DIAGNOSIS — M4854XA Collapsed vertebra, not elsewhere classified, thoracic region, initial encounter for fracture: Secondary | ICD-10-CM | POA: Diagnosis not present

## 2021-02-11 ENCOUNTER — Other Ambulatory Visit: Payer: Self-pay | Admitting: Orthopedic Surgery

## 2021-02-11 ENCOUNTER — Ambulatory Visit
Admission: RE | Admit: 2021-02-11 | Discharge: 2021-02-11 | Disposition: A | Discharge status: Home / Self Care | Payer: PPO | Source: Ambulatory Visit | Attending: Orthopedic Surgery | Admitting: Orthopedic Surgery

## 2021-02-11 ENCOUNTER — Other Ambulatory Visit (HOSPITAL_COMMUNITY): Payer: Self-pay | Admitting: Orthopedic Surgery

## 2021-02-11 DIAGNOSIS — S22060A Wedge compression fracture of T7-T8 vertebra, initial encounter for closed fracture: Secondary | ICD-10-CM

## 2021-02-16 ENCOUNTER — Ambulatory Visit
Admission: RE | Admit: 2021-02-16 | Discharge: 2021-02-16 | Disposition: A | Discharge status: Home / Self Care | Payer: PPO | Source: Ambulatory Visit | Attending: Orthopedic Surgery | Admitting: Orthopedic Surgery

## 2021-02-16 DIAGNOSIS — S22060A Wedge compression fracture of T7-T8 vertebra, initial encounter for closed fracture: Secondary | ICD-10-CM

## 2021-02-16 DIAGNOSIS — F0390 Unspecified dementia without behavioral disturbance: Secondary | ICD-10-CM | POA: Diagnosis not present

## 2021-02-16 HISTORY — PX: IR RADIOLOGIST EVAL & MGMT: IMG5224
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
## 2021-02-22 ENCOUNTER — Other Ambulatory Visit: Payer: Self-pay | Admitting: Orthopedic Surgery

## 2021-02-22 DIAGNOSIS — S22060A Wedge compression fracture of T7-T8 vertebra, initial encounter for closed fracture: Secondary | ICD-10-CM

## 2021-02-23 NOTE — Progress Notes (Signed)
Patient on schedule for Kyphoplasty 03/01/2021, called and spoke with Linda/daughter on phone with pre procedure instructions given. Made aware to be here @ 1000, NPO after MN prior to procedure and driver post procedure/recovery/discharge. Stated understanding.

## 2021-02-25 ENCOUNTER — Other Ambulatory Visit: Payer: Self-pay | Admitting: Student

## 2021-03-01 ENCOUNTER — Other Ambulatory Visit: Payer: Self-pay

## 2021-03-01 ENCOUNTER — Ambulatory Visit: Admission: RE | Admit: 2021-03-01 | Payer: PPO | Source: Ambulatory Visit | Admitting: Radiology

## 2021-03-01 ENCOUNTER — Ambulatory Visit
Admission: RE | Admit: 2021-03-01 | Discharge: 2021-03-01 | Disposition: A | Discharge status: Home / Self Care | Payer: PPO | Source: Ambulatory Visit | Attending: Orthopedic Surgery | Admitting: Orthopedic Surgery

## 2021-03-01 ENCOUNTER — Encounter: Payer: Self-pay | Admitting: Radiology

## 2021-03-01 DIAGNOSIS — M8008XA Age-related osteoporosis with current pathological fracture, vertebra(e), initial encounter for fracture: Secondary | ICD-10-CM | POA: Insufficient documentation

## 2021-03-01 DIAGNOSIS — F0390 Unspecified dementia without behavioral disturbance: Secondary | ICD-10-CM | POA: Insufficient documentation

## 2021-03-01 DIAGNOSIS — S22060A Wedge compression fracture of T7-T8 vertebra, initial encounter for closed fracture: Secondary | ICD-10-CM

## 2021-03-01 DIAGNOSIS — S22060D Wedge compression fracture of T7-T8 vertebra, subsequent encounter for fracture with routine healing: Secondary | ICD-10-CM | POA: Diagnosis not present

## 2021-03-01 DIAGNOSIS — M4854XA Collapsed vertebra, not elsewhere classified, thoracic region, initial encounter for fracture: Secondary | ICD-10-CM | POA: Diagnosis not present

## 2021-03-01 HISTORY — PX: IR KYPHO THORACIC WITH BONE BIOPSY: IMG5518

## 2021-03-01 LAB — CBC
HCT: 42.7 % (ref 36.0–46.0)
Hemoglobin: 14.8 g/dL (ref 12.0–15.0)
MCH: 30.8 pg (ref 26.0–34.0)
MCHC: 34.7 g/dL (ref 30.0–36.0)
MCV: 89 fL (ref 80.0–100.0)
Platelets: 262 10*3/uL (ref 150–400)
RBC: 4.8 MIL/uL (ref 3.87–5.11)
RDW: 15.5 % (ref 11.5–15.5)
WBC: 9.6 10*3/uL (ref 4.0–10.5)
nRBC: 0 % (ref 0.0–0.2)

## 2021-03-01 LAB — BASIC METABOLIC PANEL
Anion gap: 10 (ref 5–15)
BUN: 16 mg/dL (ref 8–23)
CO2: 24 mmol/L (ref 22–32)
Calcium: 9.2 mg/dL (ref 8.9–10.3)
Chloride: 104 mmol/L (ref 98–111)
Creatinine, Ser: 0.85 mg/dL (ref 0.44–1.00)
GFR, Estimated: >60 mL/min (ref >60)
Glucose, Bld: 116 mg/dL — ABNORMAL HIGH (ref 70–99)
Potassium: 3.6 mmol/L (ref 3.5–5.1)
Sodium: 138 mmol/L (ref 135–145)

## 2021-03-01 LAB — PROTIME-INR
INR: 1 (ref 0.8–1.2)
Prothrombin Time: 12.8 seconds (ref 11.4–15.2)

## 2021-03-01 MED ORDER — CEFAZOLIN SODIUM-DEXTROSE 2-4 GM/100ML-% IV SOLN: 2.0000 g | INTRAVENOUS | Status: DC

## 2021-03-01 MED ORDER — CEFAZOLIN SODIUM-DEXTROSE 1-4 GM/50ML-% IV SOLN
INTRAVENOUS | Status: AC | PRN
  Administered 2021-03-01: 2 g via INTRAVENOUS

## 2021-03-01 MED ORDER — SODIUM CHLORIDE 0.9 % IV SOLN
INTRAVENOUS | Status: DC
  Filled 2021-03-01: qty 1000

## 2021-03-01 MED ORDER — MIDAZOLAM HCL 2 MG/2ML IJ SOLN
INTRAMUSCULAR | Status: AC | PRN
Start: 2021-03-01 — End: 2021-03-01
  Administered 2021-03-01 (×4): .5 mg via INTRAVENOUS

## 2021-03-01 MED ORDER — LIDOCAINE 1% INJECTION FOR CIRCUMCISION
INJECTION | INTRAVENOUS | Status: DC | PRN
  Administered 2021-03-01: 20 mL via SUBCUTANEOUS

## 2021-03-01 MED ORDER — FENTANYL CITRATE (PF) 100 MCG/2ML IJ SOLN
INTRAMUSCULAR | Status: AC | PRN
Start: 2021-03-01 — End: 2021-03-01
  Administered 2021-03-01 (×4): 25 ug via INTRAVENOUS

## 2021-03-01 NOTE — Procedures (Signed)
Interventional Radiology Procedure Note  Date of Procedure: 03/01/2021  Procedure: T7 Kyphoplasty   Findings:  1. Successful T7 kyphoplasty via bipedicular approach    Complications: No immediate complications noted.   Estimated Blood Loss: minimal  Follow-up and Recommendations: 1. Bedrest 3 hours    Albin Felling, MD  Vascular & Interventional Radiology  03/01/2021 1:39 PM
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
## 2021-03-03 DIAGNOSIS — I700 Atherosclerosis of aorta: Secondary | ICD-10-CM | POA: Diagnosis not present

## 2021-03-03 DIAGNOSIS — F0280 Dementia in other diseases classified elsewhere without behavioral disturbance: Secondary | ICD-10-CM | POA: Diagnosis not present

## 2021-03-03 DIAGNOSIS — I495 Sick sinus syndrome: Secondary | ICD-10-CM | POA: Diagnosis not present

## 2021-03-03 DIAGNOSIS — J701 Chronic and other pulmonary manifestations due to radiation: Secondary | ICD-10-CM | POA: Diagnosis not present

## 2021-03-03 DIAGNOSIS — M818 Other osteoporosis without current pathological fracture: Secondary | ICD-10-CM | POA: Diagnosis not present

## 2021-03-03 DIAGNOSIS — S22060D Wedge compression fracture of T7-T8 vertebra, subsequent encounter for fracture with routine healing: Secondary | ICD-10-CM | POA: Diagnosis not present

## 2021-03-03 DIAGNOSIS — F330 Major depressive disorder, recurrent, mild: Secondary | ICD-10-CM | POA: Diagnosis not present

## 2021-03-03 DIAGNOSIS — G301 Alzheimer's disease with late onset: Secondary | ICD-10-CM | POA: Diagnosis not present

## 2021-03-03 NOTE — Progress Notes (Signed)
Please refer to my clinic note. Pt has osteoporosis and new compression fracture of the spine with lifestyle limiting back pain.

## 2021-03-08 ENCOUNTER — Other Ambulatory Visit: Payer: Self-pay | Admitting: Interventional Radiology

## 2021-03-08 DIAGNOSIS — S22060A Wedge compression fracture of T7-T8 vertebra, initial encounter for closed fracture: Secondary | ICD-10-CM

## 2021-03-09 ENCOUNTER — Other Ambulatory Visit: Payer: Self-pay | Admitting: Interventional Radiology

## 2021-03-09 DIAGNOSIS — S22060A Wedge compression fracture of T7-T8 vertebra, initial encounter for closed fracture: Secondary | ICD-10-CM

## 2021-03-10 ENCOUNTER — Ambulatory Visit
Admission: RE | Admit: 2021-03-10 | Discharge: 2021-03-10 | Disposition: A | Discharge status: Home / Self Care | Payer: PPO | Source: Ambulatory Visit | Attending: Interventional Radiology | Admitting: Interventional Radiology

## 2021-03-10 DIAGNOSIS — S22060A Wedge compression fracture of T7-T8 vertebra, initial encounter for closed fracture: Secondary | ICD-10-CM

## 2021-03-10 DIAGNOSIS — Z981 Arthrodesis status: Secondary | ICD-10-CM | POA: Diagnosis not present

## 2021-03-10 DIAGNOSIS — Z9889 Other specified postprocedural states: Secondary | ICD-10-CM | POA: Diagnosis not present

## 2021-03-15 HISTORY — PX: BACK SURGERY: SHX140

## 2021-03-16 DIAGNOSIS — S22060A Wedge compression fracture of T7-T8 vertebra, initial encounter for closed fracture: Secondary | ICD-10-CM | POA: Diagnosis not present

## 2021-03-16 DIAGNOSIS — M5414 Radiculopathy, thoracic region: Secondary | ICD-10-CM | POA: Diagnosis not present

## 2021-03-16 DIAGNOSIS — M40204 Unspecified kyphosis, thoracic region: Secondary | ICD-10-CM | POA: Diagnosis not present

## 2021-03-16 DIAGNOSIS — Z9889 Other specified postprocedural states: Secondary | ICD-10-CM | POA: Diagnosis not present

## 2021-03-16 DIAGNOSIS — S22060D Wedge compression fracture of T7-T8 vertebra, subsequent encounter for fracture with routine healing: Secondary | ICD-10-CM | POA: Diagnosis not present

## 2021-03-18 ENCOUNTER — Inpatient Hospital Stay: Admission: RE | Admit: 2021-03-18 | Payer: PPO | Source: Ambulatory Visit

## 2021-03-23 ENCOUNTER — Ambulatory Visit
Admission: RE | Admit: 2021-03-23 | Discharge: 2021-03-23 | Disposition: A | Discharge status: Home / Self Care | Payer: PPO | Source: Ambulatory Visit | Attending: Interventional Radiology | Admitting: Interventional Radiology

## 2021-03-23 ENCOUNTER — Other Ambulatory Visit: Payer: Self-pay

## 2021-03-23 DIAGNOSIS — S22060A Wedge compression fracture of T7-T8 vertebra, initial encounter for closed fracture: Secondary | ICD-10-CM | POA: Diagnosis not present

## 2021-03-23 HISTORY — PX: IR RADIOLOGIST EVAL & MGMT: IMG5224

## 2021-03-23 NOTE — Progress Notes (Signed)
Interventional Radiology - Telephone Visit    History of Present Illness  Lisa Martin is a 88 y.o. female with a relevant history of prior compression fracture at T7 treated with KP on 01/30, seen in telephone visit for continued back pain after her procedure.  We confirmed identity with 2 personal identifiers.   I spoke with the patient's daughter on the phone this afternoon. Since the procedure, the patient has had no significant relief in her back pain, which was previously rated 10/10 with significant disability as seen on her initial H&P on 01/17.    She had a thoracic spine XR done on Feb 14 which reported new compression fracture at T8 with ~50% height loss.   Past medical and surgical history reviewed. No interval changes. No interval hospitalizations.   Medications  I have reviewed the current medication list. Refer to chart for details. Current Outpatient Medications  Medication Instructions   ALPRAZolam (XANAX) 0.25 mg, Oral, At bedtime PRN   diltiazem (CARDIZEM CD) 120 mg, Oral, Daily   fluticasone (FLONASE) 50 MCG/ACT nasal spray 1 spray, Daily   gabapentin (NEURONTIN) 100 mg, Oral, 3 times daily   HYDROcodone-acetaminophen (NORCO/VICODIN) 5-325 MG tablet 1 tablet, Oral, Every 6 hours PRN   hydrOXYzine (ATARAX) 25 mg, 3 times daily PRN   levothyroxine (SYNTHROID) 88 mcg, Oral, Daily   lidocaine (LIDODERM) 5 % 1 patch, Transdermal, Every 12 hours, Remove & Discard patch within 12 hours or as directed by MD   memantine (NAMENDA) 5 mg, Oral, 2 times daily   omeprazole (PRILOSEC) 20 mg, Oral, Daily   potassium chloride (KLOR-CON M) 10 MEQ tablet 10 mEq, Oral, 2 times daily   rivastigmine (EXELON) 3 mg, Oral, 2 times daily   vitamin B-12 (CYANOCOBALAMIN) 1,000 mcg, Oral, Daily       Pertinent Lab Results CBC Latest Ref Rng & Units 03/01/2021 11/28/2011  WBC 4.0 - 10.5 K/uL 9.6 -  Hemoglobin 12.0 - 15.0 g/dL 14.8 13.7  Hematocrit 36.0 - 46.0 % 42.7 -  Platelets 150 - 400  K/uL 262 -   CMP Latest Ref Rng & Units 03/01/2021 12/13/2017 11/28/2011  Glucose 70 - 99 mg/dL 116(H) - -  BUN 8 - 23 mg/dL 16 - -  Creatinine 0.44 - 1.00 mg/dL 0.85 0.80 -  Sodium 135 - 145 mmol/L 138 - -  Potassium 3.5 - 5.1 mmol/L 3.6 - 3.8  Chloride 98 - 111 mmol/L 104 - -  CO2 22 - 32 mmol/L 24 - -  Calcium 8.9 - 10.3 mg/dL 9.2 - -     Relevant and/or Recent Imaging: Thoracic spine XR, new compression fracture at T8 with 50% height loss    Assessment & Plan Lisa Martin is a 88 y.o. female with a relevant history of prior compression fracture at T7 treated with KP on 01/30, seen in telephone visit for continued back pain after her procedure.  She had a thoracic spine XR done on Feb 14 which reported new compression fracture at T8 with ~50% height loss, likely explaining her continued pain despite a successful KP at T7.   Discussed performing KP at T8 with patient's daughter. She is amenable to proceed.   Plan:  1. Kyphoplasty at T8 for treatment of new interval acute compression fracture resulting in back pain refractory to conservative treatment and pain medication.    I spent a total of   10 Minutes in face-to-face in clinical consultation, greater than 50% of which was spent on medical  decision-making and counseling/coordinating care for T8 fracture.   Visit type: Audio only (telephone). Audio (no video) only due to patient's lack of internet/smartphone capability. Alternative for in-person consultation at [facility], [address], [place], [place]. This visit type was conducted due to national recommendations for restrictions regarding the COVID-19 Pandemic (e.g. social distancing).  This format is felt to be most appropriate for this patient at this time.  All issues noted in this document were discussed and addressed.      Albin Felling, MD  Vascular and Interventional Radiology 03/23/2021 4:11 PM

## 2021-03-25 ENCOUNTER — Other Ambulatory Visit: Payer: Self-pay | Admitting: Interventional Radiology

## 2021-03-25 DIAGNOSIS — S22000A Wedge compression fracture of unspecified thoracic vertebra, initial encounter for closed fracture: Secondary | ICD-10-CM

## 2021-03-26 ENCOUNTER — Emergency Department: Payer: PPO

## 2021-03-26 ENCOUNTER — Inpatient Hospital Stay
Admission: EM | Admit: 2021-03-26 | Discharge: 2021-04-06 | DRG: 871 | Disposition: A | Discharge status: Skilled Nursing Facility | Payer: PPO | Attending: Internal Medicine | Admitting: Internal Medicine

## 2021-03-26 DIAGNOSIS — R0689 Other abnormalities of breathing: Secondary | ICD-10-CM | POA: Diagnosis not present

## 2021-03-26 DIAGNOSIS — F411 Generalized anxiety disorder: Secondary | ICD-10-CM | POA: Diagnosis present

## 2021-03-26 DIAGNOSIS — Z955 Presence of coronary angioplasty implant and graft: Secondary | ICD-10-CM

## 2021-03-26 DIAGNOSIS — E43 Unspecified severe protein-calorie malnutrition: Secondary | ICD-10-CM

## 2021-03-26 DIAGNOSIS — A419 Sepsis, unspecified organism: Secondary | ICD-10-CM | POA: Diagnosis not present

## 2021-03-26 DIAGNOSIS — R404 Transient alteration of awareness: Secondary | ICD-10-CM | POA: Diagnosis not present

## 2021-03-26 DIAGNOSIS — R2681 Unsteadiness on feet: Secondary | ICD-10-CM | POA: Diagnosis not present

## 2021-03-26 DIAGNOSIS — N19 Unspecified kidney failure: Secondary | ICD-10-CM | POA: Diagnosis present

## 2021-03-26 DIAGNOSIS — Z79890 Hormone replacement therapy (postmenopausal): Secondary | ICD-10-CM | POA: Diagnosis not present

## 2021-03-26 DIAGNOSIS — Z681 Body mass index (BMI) 19 or less, adult: Secondary | ICD-10-CM

## 2021-03-26 DIAGNOSIS — R21 Rash and other nonspecific skin eruption: Secondary | ICD-10-CM

## 2021-03-26 DIAGNOSIS — J189 Pneumonia, unspecified organism: Secondary | ICD-10-CM | POA: Diagnosis present

## 2021-03-26 DIAGNOSIS — S22000D Wedge compression fracture of unspecified thoracic vertebra, subsequent encounter for fracture with routine healing: Secondary | ICD-10-CM | POA: Diagnosis not present

## 2021-03-26 DIAGNOSIS — R4182 Altered mental status, unspecified: Secondary | ICD-10-CM | POA: Diagnosis present

## 2021-03-26 DIAGNOSIS — Z87891 Personal history of nicotine dependence: Secondary | ICD-10-CM | POA: Diagnosis not present

## 2021-03-26 DIAGNOSIS — I10 Essential (primary) hypertension: Secondary | ICD-10-CM | POA: Diagnosis present

## 2021-03-26 DIAGNOSIS — Z853 Personal history of malignant neoplasm of breast: Secondary | ICD-10-CM

## 2021-03-26 DIAGNOSIS — Z923 Personal history of irradiation: Secondary | ICD-10-CM

## 2021-03-26 DIAGNOSIS — M6282 Rhabdomyolysis: Secondary | ICD-10-CM | POA: Diagnosis not present

## 2021-03-26 DIAGNOSIS — R6520 Severe sepsis without septic shock: Secondary | ICD-10-CM | POA: Diagnosis present

## 2021-03-26 DIAGNOSIS — K219 Gastro-esophageal reflux disease without esophagitis: Secondary | ICD-10-CM | POA: Diagnosis present

## 2021-03-26 DIAGNOSIS — Z79899 Other long term (current) drug therapy: Secondary | ICD-10-CM | POA: Diagnosis not present

## 2021-03-26 DIAGNOSIS — E039 Hypothyroidism, unspecified: Secondary | ICD-10-CM | POA: Diagnosis present

## 2021-03-26 DIAGNOSIS — R531 Weakness: Secondary | ICD-10-CM | POA: Diagnosis not present

## 2021-03-26 DIAGNOSIS — R9431 Abnormal electrocardiogram [ECG] [EKG]: Secondary | ICD-10-CM | POA: Diagnosis not present

## 2021-03-26 DIAGNOSIS — S22000A Wedge compression fracture of unspecified thoracic vertebra, initial encounter for closed fracture: Secondary | ICD-10-CM

## 2021-03-26 DIAGNOSIS — F0394 Unspecified dementia, unspecified severity, with anxiety: Secondary | ICD-10-CM | POA: Diagnosis present

## 2021-03-26 DIAGNOSIS — E860 Dehydration: Secondary | ICD-10-CM | POA: Diagnosis present

## 2021-03-26 DIAGNOSIS — R2689 Other abnormalities of gait and mobility: Secondary | ICD-10-CM | POA: Diagnosis not present

## 2021-03-26 DIAGNOSIS — Z9221 Personal history of antineoplastic chemotherapy: Secondary | ICD-10-CM | POA: Diagnosis not present

## 2021-03-26 DIAGNOSIS — R0682 Tachypnea, not elsewhere classified: Secondary | ICD-10-CM | POA: Diagnosis not present

## 2021-03-26 DIAGNOSIS — E876 Hypokalemia: Secondary | ICD-10-CM | POA: Diagnosis not present

## 2021-03-26 DIAGNOSIS — G9340 Encephalopathy, unspecified: Secondary | ICD-10-CM | POA: Diagnosis not present

## 2021-03-26 DIAGNOSIS — Z7401 Bed confinement status: Secondary | ICD-10-CM | POA: Diagnosis not present

## 2021-03-26 DIAGNOSIS — G9341 Metabolic encephalopathy: Secondary | ICD-10-CM | POA: Diagnosis present

## 2021-03-26 DIAGNOSIS — U071 COVID-19: Secondary | ICD-10-CM | POA: Diagnosis present

## 2021-03-26 DIAGNOSIS — A4151 Sepsis due to Escherichia coli [E. coli]: Principal | ICD-10-CM | POA: Diagnosis present

## 2021-03-26 DIAGNOSIS — R279 Unspecified lack of coordination: Secondary | ICD-10-CM | POA: Diagnosis not present

## 2021-03-26 DIAGNOSIS — Z741 Need for assistance with personal care: Secondary | ICD-10-CM | POA: Diagnosis not present

## 2021-03-26 DIAGNOSIS — M6281 Muscle weakness (generalized): Secondary | ICD-10-CM | POA: Diagnosis not present

## 2021-03-26 DIAGNOSIS — R0902 Hypoxemia: Secondary | ICD-10-CM | POA: Diagnosis not present

## 2021-03-26 DIAGNOSIS — Z96642 Presence of left artificial hip joint: Secondary | ICD-10-CM | POA: Diagnosis present

## 2021-03-26 DIAGNOSIS — I959 Hypotension, unspecified: Secondary | ICD-10-CM | POA: Diagnosis not present

## 2021-03-26 DIAGNOSIS — N390 Urinary tract infection, site not specified: Secondary | ICD-10-CM | POA: Diagnosis present

## 2021-03-26 LAB — RESP PANEL BY RT-PCR (FLU A&B, COVID) ARPGX2
Influenza A by PCR: NEGATIVE
Influenza B by PCR: NEGATIVE
SARS Coronavirus 2 by RT PCR: POSITIVE — AB

## 2021-03-26 LAB — COMPREHENSIVE METABOLIC PANEL
ALT: 21 U/L (ref 0–44)
AST: 39 U/L (ref 15–41)
Albumin: 3.9 g/dL (ref 3.5–5.0)
Alkaline Phosphatase: 93 U/L (ref 38–126)
Anion gap: 9 (ref 5–15)
BUN: 26 mg/dL — ABNORMAL HIGH (ref 8–23)
CO2: 25 mmol/L (ref 22–32)
Calcium: 9 mg/dL (ref 8.9–10.3)
Chloride: 100 mmol/L (ref 98–111)
Creatinine, Ser: 0.87 mg/dL (ref 0.44–1.00)
GFR, Estimated: >60 mL/min (ref >60)
Glucose, Bld: 132 mg/dL — ABNORMAL HIGH (ref 70–99)
Potassium: 3.8 mmol/L (ref 3.5–5.1)
Sodium: 134 mmol/L — ABNORMAL LOW (ref 135–145)
Total Bilirubin: 1.4 mg/dL — ABNORMAL HIGH (ref 0.3–1.2)
Total Protein: 7 g/dL (ref 6.5–8.1)

## 2021-03-26 LAB — CBC WITH DIFFERENTIAL/PLATELET
Abs Immature Granulocytes: 0.07 10*3/uL (ref 0.00–0.07)
Basophils Absolute: 0.1 10*3/uL (ref 0.0–0.1)
Basophils Relative: 0 %
Eosinophils Absolute: 0.1 10*3/uL (ref 0.0–0.5)
Eosinophils Relative: 1 %
HCT: 41.9 % (ref 36.0–46.0)
Hemoglobin: 14.5 g/dL (ref 12.0–15.0)
Immature Granulocytes: 0 %
Lymphocytes Relative: 3 %
Lymphs Abs: 0.5 10*3/uL — ABNORMAL LOW (ref 0.7–4.0)
MCH: 29.7 pg (ref 26.0–34.0)
MCHC: 34.6 g/dL (ref 30.0–36.0)
MCV: 85.9 fL (ref 80.0–100.0)
Monocytes Absolute: 1 10*3/uL (ref 0.1–1.0)
Monocytes Relative: 6 %
Neutro Abs: 15.7 10*3/uL — ABNORMAL HIGH (ref 1.7–7.7)
Neutrophils Relative %: 90 %
Platelets: 360 10*3/uL (ref 150–400)
RBC: 4.88 MIL/uL (ref 3.87–5.11)
RDW: 15.4 % (ref 11.5–15.5)
WBC: 17.4 10*3/uL — ABNORMAL HIGH (ref 4.0–10.5)
nRBC: 0 % (ref 0.0–0.2)

## 2021-03-26 MED ORDER — SODIUM CHLORIDE 0.9 % IV SOLN
500.0000 mg | INTRAVENOUS | Status: DC
  Administered 2021-03-27 – 2021-03-29 (×3): 500 mg via INTRAVENOUS
  Filled 2021-03-26 (×2): qty 500
  Filled 2021-03-26 (×2): qty 5

## 2021-03-26 MED ORDER — LACTATED RINGERS IV BOLUS (SEPSIS)
1000.0000 mL | Freq: Once | INTRAVENOUS | Status: AC
  Administered 2021-03-27: 1000 mL via INTRAVENOUS

## 2021-03-26 MED ORDER — LACTATED RINGERS IV SOLN: INTRAVENOUS | Status: AC

## 2021-03-26 MED ORDER — SODIUM CHLORIDE 0.9 % IV SOLN
2.0000 g | INTRAVENOUS | Status: DC
  Administered 2021-03-27 – 2021-03-28 (×3): 2 g via INTRAVENOUS
  Filled 2021-03-26: qty 2
  Filled 2021-03-26 (×3): qty 20

## 2021-03-26 MED ORDER — LACTATED RINGERS IV BOLUS (SEPSIS)
250.0000 mL | Freq: Once | INTRAVENOUS | Status: AC
  Administered 2021-03-27: 250 mL via INTRAVENOUS

## 2021-03-26 NOTE — ED Provider Notes (Signed)
[facility] Provider Note    Event Date/Time   First MD Initiated Contact with Patient 03/26/21 2151     (approximate)   History   Altered Mental Status   HPI  Lisa Martin is a 88 y.o. female who presents to the ED for evaluation of Altered Mental Status   I reviewed PCP visit from 2/14.  Patient had a closed wedge compression fracture of T7 s/p kyphoplasty earlier this month.  Daughter brings patient to the ED for evaluation of increased confusion over the past couple days.  Daughter reports that she has been coughing, eating less and has not seemed quite right for couple days.  Confused, repetitive and not her typical self in a generalized fashion.  No falls, syncopal episodes or acute events.  No complaints of abdominal pain, emesis or diarrhea.  Daughter reports that she has been giving more oxycodone for the past few days over 1 week due to progressively worsening pain.  Reports that patient needs a T8 kyphoplasty now as well.   Physical Exam   Triage Vital Signs: ED Triage Vitals  Enc Vitals Group     BP 03/26/21 2131 (!) 150/90     Pulse Rate 03/26/21 2131 91     Resp 03/26/21 2131 (!) 24     Temp 03/26/21 2147 99.7 F (37.6 C)     Temp Source 03/26/21 2147 Rectal     SpO2 03/26/21 2131 100 %     Weight 03/26/21 2137 87 lb 1.3 oz (39.5 kg)     Height 03/26/21 2137 5' (1.524 m)     Head Circumference --      Peak Flow --      Pain Score --      Pain Loc --      Pain Edu? --      Excl. in [place]? --     Most recent vital signs: Vitals:   03/26/21 2131 03/26/21 2147  BP: (!) 150/90   Pulse: 91   Resp: (!) 24   Temp:  99.7 F (37.6 C)  SpO2: 100%     General: Awake, no distress.  Pleasantly disoriented.  Follows commands in all 4.  Fidgeting with blankets and her monitoring devices. CV:  Good peripheral perfusion. RRR Resp:  Tachypneic with clear lungs.  No wheezing. Abd:  No distention.  Soft and benign throughout. MSK:  No  deformity noted.  Neuro:  No focal deficits appreciated. Other:  Does have a flat erythematous rash to the abdomen.   ED Results / Procedures / Treatments   Labs (all labs ordered are listed, but only abnormal results are displayed) Labs Reviewed  CBC WITH DIFFERENTIAL/PLATELET - Abnormal; Notable for the following components:      Result Value   WBC 17.4 (*)    Neutro Abs 15.7 (*)    Lymphs Abs 0.5 (*)    All other components within normal limits  RESP PANEL BY RT-PCR (FLU A&B, COVID) ARPGX2  CULTURE, BLOOD (ROUTINE X 2)  CULTURE, BLOOD (ROUTINE X 2)  URINE CULTURE  URINALYSIS, ROUTINE W REFLEX MICROSCOPIC  COMPREHENSIVE METABOLIC PANEL  LACTIC ACID, PLASMA  LACTIC ACID, PLASMA  PROTIME-INR  APTT  URINALYSIS, COMPLETE (UACMP) WITH MICROSCOPIC  PROCALCITONIN  PROCALCITONIN    EKG Sinus rhythm, rate of 91 bpm.  Normal axis and intervals.  Wandering baseline.  No clearly ischemic features.  RADIOLOGY 2 view CXR reviewed by me with left basilar infiltrate. CT head reviewed by  me without evidence of acute intracranial pathology.  Official radiology report(s): DG Chest 2 View  Result Date: 03/26/2021 CLINICAL DATA:  Tachypnea. EXAM: CHEST - 2 VIEW COMPARISON:  10/21/2015 FINDINGS: Patient has taken a poor inspiration. There is newly seen infiltrate in the lower lungs, best seen on the lateral view, probably present in the left lower. No visible effusion. Old traumatic deformity of the left ribs. Old corpectomy and fusion in the lower thoracic spine. Old augmented fracture in the midthoracic spine. Un augmented fracture medially inferior to that. This appears to have worsened compared to thoracic radiography of 2 weeks ago. IMPRESSION: Basilar pneumonia, probably on the left. This is best seen on the lateral view. Further progression of collapse of a thoracic vertebral body, presumably T8, immediately below the previously augmented level. This is compared to study of just 14 days  ago. Electronically Signed   By: Nelson Chimes M.D.   On: 03/26/2021 22:40   CT HEAD WO CONTRAST (5MM)  Result Date: 03/26/2021 CLINICAL DATA:  Mental status changes of unknown cause. Generalized weakness. EXAM: CT HEAD WITHOUT CONTRAST TECHNIQUE: Contiguous axial images were obtained from the base of the skull through the vertex without intravenous contrast. RADIATION DOSE REDUCTION: This exam was performed according to the departmental dose-optimization program which includes automated exposure control, adjustment of the mA and/or kV according to patient size and/or use of iterative reconstruction technique. COMPARISON:  06/24/2020 FINDINGS: Brain: Generalized atrophy. Possible temporal lobe predominance. Chronic small-vessel ischemic changes of the cerebral hemispheric white matter. No evidence of acute infarction, mass lesion, hemorrhage, hydrocephalus or extra-axial collection. Vascular: There is atherosclerotic calcification of the major vessels at the base of the brain. Skull: Negative Sinuses/Orbits: Clear/normal Other: None IMPRESSION: No acute CT finding. Brain atrophy, possibly with temporal lobe predominance. Electronically Signed   By: Nelson Chimes M.D.   On: 03/26/2021 22:44    PROCEDURES and INTERVENTIONS:  .1-3 Lead EKG Interpretation Performed by: Vladimir Crofts, MD Authorized by: Vladimir Crofts, MD     Interpretation: normal     ECG rate:  90   ECG rate assessment: normal     Rhythm: sinus rhythm     Ectopy: none     Conduction: normal   .Critical Care Performed by: Vladimir Crofts, MD Authorized by: Vladimir Crofts, MD   Critical care provider statement:    Critical care time (minutes):  30   Critical care time was exclusive of:  Separately billable procedures and treating other patients   Critical care was necessary to treat or prevent imminent or life-threatening deterioration of the following conditions:  Sepsis   Critical care was time spent personally by me on the following  activities:  Development of treatment plan with patient or surrogate, discussions with consultants, evaluation of patient's response to treatment, examination of patient, ordering and review of laboratory studies, ordering and review of radiographic studies, ordering and performing treatments and interventions, pulse oximetry, re-evaluation of patient's condition and review of old charts  Medications  lactated ringers infusion (has no administration in time range)  lactated ringers bolus 1,000 mL (has no administration in time range)    And  lactated ringers bolus 250 mL (has no administration in time range)  cefTRIAXone (ROCEPHIN) 2 g in sodium chloride 0.9 % 100 mL IVPB (has no administration in time range)  azithromycin (ZITHROMAX) 500 mg in sodium chloride 0.9 % 250 mL IVPB (has no administration in time range)     IMPRESSION / MDM / ASSESSMENT AND PLAN /  ED COURSE  I reviewed the triage vital signs and the nursing notes.  88 year old woman with recent thoracic spinal fractures presents to the ED with altered mentation, likely metabolic encephalopathy in the setting of sepsis and community-acquired pneumonia.  She was tachypneic, but not hypoxic and no signs of shock.  Has a nonfocal examination with disorientation, fidgeting and confusion.  No signs of trauma, vascular or neurologic deficits.  CT of the head without evidence of ICH or intracranial pathology to cause her altered mentation.  2 view chest x-ray with left basilar infiltrate.  In conjunction with her tachypnea, cough and leukocytosis, I suspect CAP causing sepsis and her encephalopathy.  Awaiting remainder of blood work prior to admission.  Clinical Course as of 03/26/21 2313  Fri Mar 26, 2021  2211 Discussed plan of care with daughter. I indicated she may need to admitted to the hospital. She's agreeable [DS]  2311 Educated daughter of pneumonia,, sepsis and need for medical admission.  She is agreeable. [DS]    Clinical  Course User Index [DS] Vladimir Crofts, MD     FINAL CLINICAL IMPRESSION(S) / ED DIAGNOSES   Final diagnoses:  Sepsis with encephalopathy without septic shock, due to unspecified organism [facility])  Altered mental status, unspecified altered mental status type  Community acquired pneumonia of left lower lobe of lung     Rx / DC Orders   ED Discharge Orders     None        Note:  This document was prepared using Dragon voice recognition software and may include unintentional dictation errors.   Vladimir Crofts, MD 03/26/21 [phone]

## 2021-03-26 NOTE — ED Triage Notes (Signed)
Per EMS they were called out for altered mental status and generalized weakness. EMS reports pt has a back fracture from a few weeks ago. Pt also has a red rash on torso.

## 2021-03-26 NOTE — Progress Notes (Signed)
CODE SEPSIS - PHARMACY COMMUNICATION  **Broad Spectrum Antibiotics should be administered within 1 hour of Sepsis diagnosis**  Time Code Sepsis Called/Page Received: 2259  Antibiotics Ordered: Azithromycin & Ceftriaxone  Time of 1st antibiotic administration: 0014  Renda Rolls, PharmD, MBA 03/26/2021 11:02 PM

## 2021-03-27 DIAGNOSIS — U071 COVID-19: Secondary | ICD-10-CM | POA: Diagnosis not present

## 2021-03-27 DIAGNOSIS — K219 Gastro-esophageal reflux disease without esophagitis: Secondary | ICD-10-CM | POA: Diagnosis not present

## 2021-03-27 DIAGNOSIS — E039 Hypothyroidism, unspecified: Secondary | ICD-10-CM | POA: Diagnosis present

## 2021-03-27 DIAGNOSIS — F0394 Unspecified dementia, unspecified severity, with anxiety: Secondary | ICD-10-CM | POA: Diagnosis present

## 2021-03-27 DIAGNOSIS — I10 Essential (primary) hypertension: Secondary | ICD-10-CM | POA: Diagnosis present

## 2021-03-27 DIAGNOSIS — J189 Pneumonia, unspecified organism: Secondary | ICD-10-CM

## 2021-03-27 DIAGNOSIS — Z79899 Other long term (current) drug therapy: Secondary | ICD-10-CM | POA: Diagnosis not present

## 2021-03-27 DIAGNOSIS — G9341 Metabolic encephalopathy: Secondary | ICD-10-CM | POA: Diagnosis present

## 2021-03-27 DIAGNOSIS — R21 Rash and other nonspecific skin eruption: Secondary | ICD-10-CM | POA: Diagnosis not present

## 2021-03-27 DIAGNOSIS — R4182 Altered mental status, unspecified: Secondary | ICD-10-CM | POA: Diagnosis present

## 2021-03-27 DIAGNOSIS — N19 Unspecified kidney failure: Secondary | ICD-10-CM | POA: Diagnosis present

## 2021-03-27 DIAGNOSIS — Z681 Body mass index (BMI) 19 or less, adult: Secondary | ICD-10-CM | POA: Diagnosis not present

## 2021-03-27 DIAGNOSIS — A419 Sepsis, unspecified organism: Secondary | ICD-10-CM | POA: Diagnosis not present

## 2021-03-27 DIAGNOSIS — R6520 Severe sepsis without septic shock: Secondary | ICD-10-CM | POA: Diagnosis not present

## 2021-03-27 DIAGNOSIS — Z923 Personal history of irradiation: Secondary | ICD-10-CM | POA: Diagnosis not present

## 2021-03-27 DIAGNOSIS — A4151 Sepsis due to Escherichia coli [E. coli]: Secondary | ICD-10-CM | POA: Diagnosis present

## 2021-03-27 DIAGNOSIS — F411 Generalized anxiety disorder: Secondary | ICD-10-CM | POA: Diagnosis present

## 2021-03-27 DIAGNOSIS — E860 Dehydration: Secondary | ICD-10-CM | POA: Diagnosis present

## 2021-03-27 DIAGNOSIS — E43 Unspecified severe protein-calorie malnutrition: Secondary | ICD-10-CM | POA: Diagnosis present

## 2021-03-27 DIAGNOSIS — Z79890 Hormone replacement therapy (postmenopausal): Secondary | ICD-10-CM | POA: Diagnosis not present

## 2021-03-27 DIAGNOSIS — Z9221 Personal history of antineoplastic chemotherapy: Secondary | ICD-10-CM | POA: Diagnosis not present

## 2021-03-27 DIAGNOSIS — E876 Hypokalemia: Secondary | ICD-10-CM | POA: Diagnosis not present

## 2021-03-27 DIAGNOSIS — N390 Urinary tract infection, site not specified: Secondary | ICD-10-CM | POA: Diagnosis present

## 2021-03-27 DIAGNOSIS — Z87891 Personal history of nicotine dependence: Secondary | ICD-10-CM | POA: Diagnosis not present

## 2021-03-27 DIAGNOSIS — Z853 Personal history of malignant neoplasm of breast: Secondary | ICD-10-CM | POA: Diagnosis not present

## 2021-03-27 DIAGNOSIS — M6282 Rhabdomyolysis: Secondary | ICD-10-CM | POA: Diagnosis not present

## 2021-03-27 DIAGNOSIS — Z955 Presence of coronary angioplasty implant and graft: Secondary | ICD-10-CM | POA: Diagnosis not present

## 2021-03-27 LAB — COMPREHENSIVE METABOLIC PANEL
ALT: 23 U/L (ref 0–44)
AST: 49 U/L — ABNORMAL HIGH (ref 15–41)
Albumin: 3.3 g/dL — ABNORMAL LOW (ref 3.5–5.0)
Alkaline Phosphatase: 81 U/L (ref 38–126)
Anion gap: 13 (ref 5–15)
BUN: 17 mg/dL (ref 8–23)
CO2: 26 mmol/L (ref 22–32)
Calcium: 8.7 mg/dL — ABNORMAL LOW (ref 8.9–10.3)
Chloride: 96 mmol/L — ABNORMAL LOW (ref 98–111)
Creatinine, Ser: 0.74 mg/dL (ref 0.44–1.00)
GFR, Estimated: >60 mL/min (ref >60)
Glucose, Bld: 110 mg/dL — ABNORMAL HIGH (ref 70–99)
Potassium: 3.8 mmol/L (ref 3.5–5.1)
Sodium: 135 mmol/L (ref 135–145)
Total Bilirubin: 1.3 mg/dL — ABNORMAL HIGH (ref 0.3–1.2)
Total Protein: 6 g/dL — ABNORMAL LOW (ref 6.5–8.1)

## 2021-03-27 LAB — CBC WITH DIFFERENTIAL/PLATELET
Abs Immature Granulocytes: 0.1 10*3/uL — ABNORMAL HIGH (ref 0.00–0.07)
Basophils Absolute: 0 10*3/uL (ref 0.0–0.1)
Basophils Relative: 0 %
Eosinophils Absolute: 0.2 10*3/uL (ref 0.0–0.5)
Eosinophils Relative: 1 %
HCT: 40.4 % (ref 36.0–46.0)
Hemoglobin: 14.2 g/dL (ref 12.0–15.0)
Immature Granulocytes: 1 %
Lymphocytes Relative: 5 %
Lymphs Abs: 0.8 10*3/uL (ref 0.7–4.0)
MCH: 29.9 pg (ref 26.0–34.0)
MCHC: 35.1 g/dL (ref 30.0–36.0)
MCV: 85.1 fL (ref 80.0–100.0)
Monocytes Absolute: 0.8 10*3/uL (ref 0.1–1.0)
Monocytes Relative: 5 %
Neutro Abs: 13.4 10*3/uL — ABNORMAL HIGH (ref 1.7–7.7)
Neutrophils Relative %: 88 %
Platelets: 353 10*3/uL (ref 150–400)
RBC: 4.75 MIL/uL (ref 3.87–5.11)
RDW: 15.2 % (ref 11.5–15.5)
WBC: 15.2 10*3/uL — ABNORMAL HIGH (ref 4.0–10.5)
nRBC: 0 % (ref 0.0–0.2)

## 2021-03-27 LAB — URINALYSIS, COMPLETE (UACMP) WITH MICROSCOPIC
Bacteria, UA: NONE SEEN
Bilirubin Urine: NEGATIVE
Glucose, UA: NEGATIVE mg/dL
Ketones, ur: NEGATIVE mg/dL
Nitrite: NEGATIVE
Protein, ur: NEGATIVE mg/dL
Specific Gravity, Urine: 1.014 (ref 1.005–1.030)
pH: 5 (ref 5.0–8.0)

## 2021-03-27 LAB — TSH: TSH: 7.451 u[IU]/mL — ABNORMAL HIGH (ref 0.350–4.500)

## 2021-03-27 LAB — PROCALCITONIN
Procalcitonin: <0.1 ng/mL
Procalcitonin: <0.1 ng/mL

## 2021-03-27 LAB — CK: Total CK: 1087 U/L — ABNORMAL HIGH (ref 38–234)

## 2021-03-27 LAB — MAGNESIUM
Magnesium: 2 mg/dL (ref 1.7–2.4)
Magnesium: 1.6 mg/dL — ABNORMAL LOW (ref 1.7–2.4)

## 2021-03-27 LAB — APTT: aPTT: 30 seconds (ref 24–36)

## 2021-03-27 LAB — LACTIC ACID, PLASMA
Lactic Acid, Venous: 1.7 mmol/L (ref 0.5–1.9)
Lactic Acid, Venous: 1.8 mmol/L (ref 0.5–1.9)

## 2021-03-27 LAB — STREP PNEUMONIAE URINARY ANTIGEN: Strep Pneumo Urinary Antigen: NEGATIVE

## 2021-03-27 LAB — C-REACTIVE PROTEIN: CRP: <0.5 mg/dL (ref <1.0)

## 2021-03-27 LAB — BLOOD GAS, VENOUS: Patient temperature: 37

## 2021-03-27 LAB — PHOSPHORUS: Phosphorus: 2.7 mg/dL (ref 2.5–4.6)

## 2021-03-27 LAB — PROTIME-INR
INR: 1.1 (ref 0.8–1.2)
Prothrombin Time: 13.9 seconds (ref 11.4–15.2)

## 2021-03-27 MED ORDER — NALOXONE HCL 2 MG/2ML IJ SOSY
0.4000 mg | PREFILLED_SYRINGE | INTRAMUSCULAR | Status: DC | PRN
Start: 2021-03-27 — End: 2021-04-06
  Filled 2021-03-27: qty 2

## 2021-03-27 MED ORDER — MEMANTINE HCL 5 MG PO TABS
5.0000 mg | ORAL_TABLET | Freq: Two times a day (BID) | ORAL | Status: DC
  Administered 2021-03-27 – 2021-04-06 (×20): 5 mg via ORAL
  Filled 2021-03-27 (×21): qty 1

## 2021-03-27 MED ORDER — ACETAMINOPHEN 325 MG PO TABS
650.0000 mg | ORAL_TABLET | Freq: Four times a day (QID) | ORAL | Status: DC | PRN
  Administered 2021-03-29 – 2021-04-05 (×4): 650 mg via ORAL
  Filled 2021-03-27 (×4): qty 2

## 2021-03-27 MED ORDER — CHLORHEXIDINE GLUCONATE 0.12 % MT SOLN
15.0000 mL | Freq: Two times a day (BID) | OROMUCOSAL | Status: DC
  Administered 2021-03-28 – 2021-04-05 (×16): 15 mL via OROMUCOSAL
  Filled 2021-03-27 (×19): qty 15

## 2021-03-27 MED ORDER — OXYCODONE HCL 5 MG PO TABS
5.0000 mg | ORAL_TABLET | Freq: Four times a day (QID) | ORAL | Status: DC | PRN
  Administered 2021-03-28 – 2021-04-06 (×12): 5 mg via ORAL
  Filled 2021-03-27 (×13): qty 1

## 2021-03-27 MED ORDER — ACETAMINOPHEN 650 MG RE SUPP: 650.0000 mg | Freq: Four times a day (QID) | RECTAL | Status: DC | PRN

## 2021-03-27 MED ORDER — DIPHENHYDRAMINE HCL 25 MG PO CAPS
25.0000 mg | ORAL_CAPSULE | Freq: Three times a day (TID) | ORAL | Status: DC | PRN
  Administered 2021-03-27 – 2021-03-29 (×2): 25 mg via ORAL
  Filled 2021-03-27 (×3): qty 1

## 2021-03-27 MED ORDER — PREDNISONE 20 MG PO TABS
40.0000 mg | ORAL_TABLET | Freq: Every day | ORAL | Status: DC
  Administered 2021-03-27 – 2021-03-28 (×2): 40 mg via ORAL
  Filled 2021-03-27 (×2): qty 2

## 2021-03-27 MED ORDER — ORAL CARE MOUTH RINSE
15.0000 mL | Freq: Two times a day (BID) | OROMUCOSAL | Status: DC
  Administered 2021-03-27 – 2021-04-06 (×15): 15 mL via OROMUCOSAL

## 2021-03-27 MED ORDER — HYDROCORTISONE 1 % EX CREA
TOPICAL_CREAM | Freq: Three times a day (TID) | CUTANEOUS | Status: DC
  Filled 2021-03-27 (×2): qty 28

## 2021-03-27 MED ORDER — ALBUTEROL SULFATE HFA 108 (90 BASE) MCG/ACT IN AERS
1.0000 | INHALATION_SPRAY | RESPIRATORY_TRACT | Status: DC | PRN
  Filled 2021-03-27: qty 6.7

## 2021-03-27 MED ORDER — NIRMATRELVIR/RITONAVIR (PAXLOVID) TABLET (RENAL DOSING)
2.0000 | ORAL_TABLET | Freq: Two times a day (BID) | ORAL | Status: DC
  Administered 2021-03-27 – 2021-03-28 (×4): 2 via ORAL
  Filled 2021-03-27 (×2): qty 20

## 2021-03-27 MED ORDER — LEVOTHYROXINE SODIUM 88 MCG PO TABS
88.0000 ug | ORAL_TABLET | Freq: Every day | ORAL | Status: DC
  Administered 2021-03-27 – 2021-04-06 (×9): 88 ug via ORAL
  Filled 2021-03-27 (×9): qty 1

## 2021-03-27 MED ORDER — ALPRAZOLAM 0.25 MG PO TABS
0.2500 mg | ORAL_TABLET | Freq: Once | ORAL | Status: AC
  Administered 2021-03-27: 0.25 mg via ORAL
  Filled 2021-03-27: qty 1

## 2021-03-27 MED ORDER — DILTIAZEM HCL ER COATED BEADS 120 MG PO CP24
120.0000 mg | ORAL_CAPSULE | Freq: Every day | ORAL | Status: DC
Start: 2021-03-27 — End: 2021-04-06
  Administered 2021-03-27 – 2021-04-06 (×11): 120 mg via ORAL
  Filled 2021-03-27 (×11): qty 1

## 2021-03-27 NOTE — Progress Notes (Signed)
Sepsis tracking by eLINK 

## 2021-03-27 NOTE — ED Provider Notes (Signed)
----------------------------------------- °  12:25 AM on 03/27/2021 -----------------------------------------  I took over care of this patient from Dr. Tamala Julian.  Patient is being admitted for pneumonia and altered mental status, and is also COVID-positive.  Vital signs are stable.  I consulted Dr. Velia Meyer from the hospitalist service; based on our discussion he agreed to admit the patient.   Arta Silence, MD 03/27/21 [phone]
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
## 2021-03-27 NOTE — Progress Notes (Signed)
Pt continuously fidgeting in bed. Pulling telemetry leads off and undressing herself. Upon bed alarm going off I found pt in bed with brief on the floor beside the bed and patient was actively trying to sit up. Pt directed to remain in bed and try to rest. Placed pt back on telemetry with resistance from pt. Covered pt back up, made sure bed was in lowest position. Bed alarm on. Call bell in reach.

## 2021-03-27 NOTE — Progress Notes (Signed)
Cross Cover Patient repeatedly trying to get out of bed and nurse reports inability to redirect. Dose of xanax that patient normally takes at home has been ordered. Bed alarm and tele-sitter monitoring

## 2021-03-27 NOTE — Evaluation (Signed)
Occupational Therapy Evaluation Patient Details Name: Lisa Martin MRN: [id number] DOB: 1932-01-12 Today's Date: 03/27/2021   History of Present Illness Pt is an 88 y/o F w/ PMH: advanced dementia, hypothyroidism, hypertension, recent kpyohplasty at T7 on 1/30, compression fx to T8 on XR on 2/14 w/ plan for upcoming kyphoplasty who presented to ED via EMS d/t AMS and was found to be COVID +. Adm for sepsis r/t PNA.   Clinical Impression   Pt seen for OT evaluation this date in setting of acute hospitalization d/t sepsis. Pt presents this date with confusion as well as generalized weakness, deconditioning and decreased fxl activity tolerance. She requires MOD A for sup to sit with HOB elevated to assist with trunk elevation to reduce pain. She demos F static sitting balance once hips squared and feet supported. She requires MOD A for seated LB dressing to doff her socks and don non-skid socks. Pt requires MOD A with arm in arm support to CTS as well as foot blocking to prevent sliding. She generally has low activity tolerance this session and is noted to be somewhat limited by continued back pain as well which she has been addressing on an outpatient basis per chart review. She is returned to bed end of session with all needs met and in reach. Will continue to follow acutely. At this time recommending STR f/u OT services in SNF.      Recommendations for follow up therapy are one component of a multi-disciplinary discharge planning process, led by the attending physician.  Recommendations may be updated based on patient status, additional functional criteria and insurance authorization.   Follow Up Recommendations  Skilled nursing-short term rehab (<3 hours/day)    Assistance Recommended at Discharge Frequent or constant Supervision/Assistance  Patient can return home with the following A lot of help with walking and/or transfers;A lot of help with bathing/dressing/bathroom;Assistance with  cooking/housework;Direct supervision/assist for medications management;Assistance with feeding;Assist for transportation;Direct supervision/assist for financial management;Help with stairs or ramp for entrance    Functional Status Assessment  Patient has had a recent decline in their functional status and demonstrates the ability to make significant improvements in function in a reasonable and predictable amount of time.  Equipment Recommendations  Other (comment);BSC/3in1;Tub/shower seat (2ww)    Recommendations for Other Services       Precautions / Restrictions Precautions Precautions: Fall Restrictions Weight Bearing Restrictions: No Other Position/Activity Restrictions: no precautions listed in Dr. Corliss Blacker (with radiology) who performed recent T7 kypho and had recommened T8 kypho.      Mobility Bed Mobility Overal bed mobility: Needs Assistance Bed Mobility: Supine to Sit, Sit to Supine     Supine to sit: Mod assist, HOB elevated Sit to supine: Mod assist Sit to sidelying: HOB elevated General bed mobility comments: attempted to log roll given pt's recent kyphoplasty, but pt unable to sequence, so ultimately sup<>sit performed with HOB elevated and MOD A for trunk assist to reduce pain    Transfers Overall transfer level: Needs assistance Equipment used: 1 person hand held assist Transfers: Sit to/from Stand Sit to Stand: Mod assist           General transfer comment: arm in arm from EOB sitting with foot blocking to prevent sliding      Balance Overall balance assessment: Needs assistance Sitting-balance support: Feet supported Sitting balance-Leahy Scale: Fair     Standing balance support: Bilateral upper extremity supported Standing balance-Leahy Scale: Poor  ADL either performed or assessed with clinical judgement   ADL Overall ADL's : Needs assistance/impaired                                        General ADL Comments: MIN A with cues to sequence UB ADLs such as donning gown and MOD A as well as multimodal cues for sequencing LB ADLs such as donning socks. MOD A for ADL transfers with arm in arm.     Vision   Additional Comments: difficult to formally assess d/t cognition     Perception     Praxis      Pertinent Vitals/Pain Pain Assessment Pain Assessment: PAINAD Breathing: occasional labored breathing, short period of hyperventilation Negative Vocalization: occasional moan/groan, low speech, negative/disapproving quality Facial Expression: smiling or inexpressive Body Language: tense, distressed pacing, fidgeting Consolability: no need to console PAINAD Score: 3 Pain Intervention(s): Monitored during session, Repositioned (grimmaced with coming to EOB sitting)     Hand Dominance     Extremity/Trunk Assessment Upper Extremity Assessment Upper Extremity Assessment: Generalized weakness   Lower Extremity Assessment Lower Extremity Assessment: Generalized weakness   Cervical / Trunk Assessment Cervical / Trunk Assessment: Kyphotic   Communication     Cognition Arousal/Alertness: Awake/alert Behavior During Therapy: WFL for tasks assessed/performed Overall Cognitive Status: Impaired/Different from baseline Area of Impairment: Orientation, Memory, Following commands, Safety/judgement, Problem solving                 Orientation Level: Disoriented to, Place, Time, Situation   Memory: Decreased short-term memory Following Commands: Follows one step commands inconsistently, Follows one step commands with increased time Safety/Judgement: Decreased awareness of deficits   Problem Solving: Slow processing, Requires tactile cues, Requires verbal cues, Difficulty sequencing General Comments: unsure of baseilne cognition as pt with no family member present at time of eval, but chart indicates that daughter reported pt's current cognitive status as different than  baseline.     General Comments       Exercises     Shoulder Instructions      Home Living                                   Additional Comments: pt reports she lives in [place], but unsure of efficacy of this information as she reports living with her mom and dad      Prior Functioning/Environment Prior Level of Function : Patient poor historian/Family not available             Mobility Comments: reports being INDEP at baseline          OT Problem List: Decreased strength;Decreased activity tolerance;Impaired balance (sitting and/or standing);Decreased cognition;Decreased safety awareness;Decreased knowledge of use of DME or AE;Pain      OT Treatment/Interventions: Self-care/ADL training;Therapeutic exercise;Therapeutic activities;Patient/family education    OT Goals(Current goals can be found in the care plan section) Acute Rehab OT Goals Patient Stated Goal: none stated OT Goal Formulation: Patient unable to participate in goal setting Time For Goal Achievement: 04/10/21 Potential to Achieve Goals: Fair  OT Frequency: Min 2X/week    Co-evaluation              AM-PAC OT "6 Clicks" Daily Activity     Outcome Measure Help from another person eating meals?: A Little Help from another person taking care  of personal grooming?: A Little Help from another person toileting, which includes using toliet, bedpan, or urinal?: A Lot Help from another person bathing (including washing, rinsing, drying)?: A Lot Help from another person to put on and taking off regular upper body clothing?: A Little Help from another person to put on and taking off regular lower body clothing?: A Lot 6 Click Score: 15   End of Session Equipment Utilized During Treatment: Gait belt (high to avoid mid-thoracic region) Nurse Communication: Mobility status  Activity Tolerance: Patient tolerated treatment well Patient left: in bed;with call bell/phone within reach;with bed  alarm set  OT Visit Diagnosis: Unsteadiness on feet (R26.81);Muscle weakness (generalized) (M62.81)                Time: 4944-9675 OT Time Calculation (min): 17 min Charges:  OT General Charges $OT Visit: 1 Visit OT Evaluation $OT Eval Moderate Complexity: 1 Mod OT Treatments $Self Care/Home Management : 8-22 mins  Gerrianne Scale, MS, OTR/L ascom [phone] 03/27/21, 6:32 PM

## 2021-03-27 NOTE — ED Notes (Signed)
Full linen change, fresh chuck placed on pt, monitoring equipment reattached, and pt cleaned up. Bed in lowest position, side rails up, pt in view of nurses station.

## 2021-03-27 NOTE — Progress Notes (Signed)
PROGRESS NOTE  Lisa Martin  WJX:[id number] DOB: 1931/02/12 DOA: 03/26/2021 PCP: Rusty Aus, MD   Brief Narrative:  Patient is a 88 year old female with history of advanced dementia, hypothyroidism, hypertension who was brought to home to the emergency department for the evaluation of altered mental status.  She was also having nonproductive cough, developed maculopapular rash on her chest and abdomen.  Patient recently had a T7 kyphoplasty on 03/01/2021 and has been planned for another T8 kyphoplasty on 04/02/2021.  On presentation she was hemodynamically stable.  COVID screening test came out to be positive.  Chest x-ray showed left lower lobe infiltrate consistent with pneumonia.  CTA did not show any acute intracranial process.  Patient was admitted for the management of community-acquired pneumonia.  Assessment & Plan:  Principal Problem:   Acute metabolic encephalopathy Active Problems:   Severe sepsis ([facility])   Community acquired pneumonia   Incidental finding of COVID-19 virus infection   Dehydration   Acute prerenal azotemia   Hypertension   GERD (gastroesophageal reflux disease)   Acquired hypothyroidism   Altered mental status: Secondary to acute metabolic encephalopathy from pneumonia/COVID.  Patient has history of advanced dementia.  She is oriented to place only.  Monitor mental status.  Continue supportive care, delirium precautions. Takes Xanax, Zoloft, continued home, currently on hold.  Sepsis: Suspected sepsis secondary to pneumonia.  Presented with cough, confusion, chest x-ray showing pneumonia.  Lab work showed leukocytosis.  Community-acquired pneumonia: Chest x-ray was suggestive of left lower lobe infiltrate.  No evidence of groundglass opacity, bilateral infiltrates which is typical of viral pneumonia. Started on azithromycin and ceftriaxone.  COVID positive: Currently on room air.  Not a started on steroids.  She was not hypoxic.  Started on  Paxlovid.  Dehydration: Found to be dehydrated on presentation.  Started on IV fluids  Hypertension: Currently blood pressure stable.  On diltiazem which has been continued.  Hypothyroidism:Continue Synthyroid  History of generalized anxiety disorder: On Zoloft, Xanax.  Currently on hold due to encephalopathy  GERD: Continue PPI  Rash: Patient has erythematous maculopapular rash scattered on the chest, abdomen.  Continue hydrocortisone cream, prednisone.  Etiology not clear.  Continue Benadryl for severe itching  Debility/deconditioning/compression fractures: We will consult PT/OT evaluation.Uses cane for walking,lives with daughter. Patient has compression fracture.  She underwent T7 kyphoplasty on 03/01/2021.  T8 kyphoplasty has been planned on 04/02/2021           DVT prophylaxis:SCDs Start: 03/27/21 0036     Code Status: Full Code  Family Communication:: Discussed with daughter on 2/25 on phone  Patient status:inpatient  Patient is from :Home  Anticipated discharge to::Home vs SnF  Estimated DC date:in 1-2 days   Consultants: None  Procedures:None  Antimicrobials:  Anti-infectives (From admission, onward)    Start     Dose/Rate Route Frequency Ordered Stop   03/27/21 0115  nirmatrelvir/ritonavir EUA (renal dosing) (PAXLOVID) 2 tablet        2 tablet Oral 2 times daily 03/27/21 0106 03/31/21 2159   03/26/21 2300  cefTRIAXone (ROCEPHIN) 2 g in sodium chloride 0.9 % 100 mL IVPB        2 g 200 mL/hr over 30 Minutes Intravenous Every 24 hours 03/26/21 2258 03/31/21 2259   03/26/21 2300  azithromycin (ZITHROMAX) 500 mg in sodium chloride 0.9 % 250 mL IVPB        500 mg 250 mL/hr over 60 Minutes Intravenous Every 24 hours 03/26/21 2258 03/31/21 2259  Objective: Vitals:   03/27/21 0800 03/27/21 0830 03/27/21 0952 03/27/21 1030  BP:   (!) 160/96 (!) 149/79  Pulse:   81 66  Resp:   20 18  Temp:      TempSrc:      SpO2: 97% 96% 100% 100%  Weight:       Height:        Intake/Output Summary (Last 24 hours) at 03/27/2021 1114 Last data filed at 03/27/2021 0249 Gross per 24 hour  Intake 1600 ml  Output --  Net 1600 ml   Filed Weights   03/26/21 2137  Weight: 39.5 kg    Examination:  General exam: Very deconditioned, frail HEENT: PERRL Respiratory system:  no wheezes or crackles  Cardiovascular system: S1 & S2 heard, RRR.  Gastrointestinal system: Abdomen is nondistended, soft and nontender. Central nervous system: Alert and awake, oriented to place only Extremities: No edema, no clubbing ,no cyanosis Skin: Scattered maculopapular rashes on the chest and abdomen   Data Reviewed: I have personally reviewed following labs and imaging studies  CBC: Recent Labs  Lab 03/26/21 2236 03/27/21 0406  WBC 17.4* 15.2*  NEUTROABS 15.7* 13.4*  HGB 14.5 14.2  HCT 41.9 40.4  MCV 85.9 85.1  PLT 360 540   Basic Metabolic Panel: Recent Labs  Lab 03/26/21 2236 03/27/21 0406  NA 134* 135  K 3.8 3.8  CL 100 96*  CO2 25 26  GLUCOSE 132* 110*  BUN 26* 17  CREATININE 0.87 0.74  CALCIUM 9.0 8.7*  MG 2.0 1.6*  PHOS  --  2.7     Recent Results (from the past 240 hour(s))  Resp Panel by RT-PCR (Flu A&B, Covid) Nasopharyngeal Swab     Status: Abnormal   Collection Time: 03/26/21 10:36 PM   Specimen: Nasopharyngeal Swab; Nasopharyngeal(NP) swabs in vial transport medium  Result Value Ref Range Status   SARS Coronavirus 2 by RT PCR POSITIVE (A) NEGATIVE Final    Comment: (NOTE) SARS-CoV-2 target nucleic acids are DETECTED.  The SARS-CoV-2 RNA is generally detectable in upper respiratory specimens during the acute phase of infection. Positive results are indicative of the presence of the identified virus, but do not rule out bacterial infection or co-infection with other pathogens not detected by the test. Clinical correlation with patient history and other diagnostic information is necessary to determine patient infection  status. The expected result is Negative.  Fact Sheet for Patients: EntrepreneurPulse.com.au  Fact Sheet for Healthcare Providers: IncredibleEmployment.be  This test is not yet approved or cleared by the [place] FDA and  has been authorized for detection and/or diagnosis of SARS-CoV-2 by FDA under an Emergency Use Authorization (EUA).  This EUA will remain in effect (meaning this test can be used) for the duration of  the COVID-19 declaration under Section 564(b)(1) of the A ct, 21 U.S.C. section 360bbb-3(b)(1), unless the authorization is terminated or revoked sooner.     Influenza A by PCR NEGATIVE NEGATIVE Final   Influenza B by PCR NEGATIVE NEGATIVE Final    Comment: (NOTE) The Xpert Xpress SARS-CoV-2/FLU/RSV plus assay is intended as an aid in the diagnosis of influenza from Nasopharyngeal swab specimens and should not be used as a sole basis for treatment. Nasal washings and aspirates are unacceptable for Xpert Xpress SARS-CoV-2/FLU/RSV testing.  Fact Sheet for Patients: EntrepreneurPulse.com.au  Fact Sheet for Healthcare Providers: IncredibleEmployment.be  This test is not yet approved or cleared by the [place] FDA and has been authorized for detection and/or diagnosis  of SARS-CoV-2 by FDA under an Emergency Use Authorization (EUA). This EUA will remain in effect (meaning this test can be used) for the duration of the COVID-19 declaration under Section 564(b)(1) of the Act, 21 U.S.C. section 360bbb-3(b)(1), unless the authorization is terminated or revoked.  Performed at [facility], [address], [place], [place] [address]   Blood Culture (routine x 2)     Status: None (Preliminary result)   Collection Time: 03/26/21 11:08 PM   Specimen: Left Antecubital; Blood  Result Value Ref Range Status   Specimen Description LEFT ANTECUBITAL  Final   Special Requests   Final     BOTTLES DRAWN AEROBIC AND ANAEROBIC Blood Culture adequate volume   Culture   Final    NO GROWTH < 12 HOURS Performed at [facility], [address], [place], [place] [address]    Report Status PENDING  Incomplete  Blood Culture (routine x 2)     Status: None (Preliminary result)   Collection Time: 03/26/21 11:09 PM   Specimen: Right Antecubital; Blood  Result Value Ref Range Status   Specimen Description RIGHT ANTECUBITAL  Final   Special Requests   Final    BOTTLES DRAWN AEROBIC AND ANAEROBIC Blood Culture results may not be optimal due to an inadequate volume of blood received in culture bottles   Culture   Final    NO GROWTH < 12 HOURS Performed at [facility], [address] [place], [place] [address]    Report Status PENDING  Incomplete     Radiology Studies: DG Chest 2 View  Result Date: 03/26/2021 CLINICAL DATA:  Tachypnea. EXAM: CHEST - 2 VIEW COMPARISON:  10/21/2015 FINDINGS: Patient has taken a poor inspiration. There is newly seen infiltrate in the lower lungs, best seen on the lateral view, probably present in the left lower. No visible effusion. Old traumatic deformity of the left ribs. Old corpectomy and fusion in the lower thoracic spine. Old augmented fracture in the midthoracic spine. Un augmented fracture medially inferior to that. This appears to have worsened compared to thoracic radiography of 2 weeks ago. IMPRESSION: Basilar pneumonia, probably on the left. This is best seen on the lateral view. Further progression of collapse of a thoracic vertebral body, presumably T8, immediately below the previously augmented level. This is compared to study of just 14 days ago. Electronically Signed   By: Nelson Chimes M.D.   On: 03/26/2021 22:40   CT HEAD WO CONTRAST (5MM)  Result Date: 03/26/2021 CLINICAL DATA:  Mental status changes of unknown cause. Generalized weakness. EXAM: CT HEAD WITHOUT CONTRAST TECHNIQUE: Contiguous axial images were obtained  from the base of the skull through the vertex without intravenous contrast. RADIATION DOSE REDUCTION: This exam was performed according to the departmental dose-optimization program which includes automated exposure control, adjustment of the mA and/or kV according to patient size and/or use of iterative reconstruction technique. COMPARISON:  06/24/2020 FINDINGS: Brain: Generalized atrophy. Possible temporal lobe predominance. Chronic small-vessel ischemic changes of the cerebral hemispheric white matter. No evidence of acute infarction, mass lesion, hemorrhage, hydrocephalus or extra-axial collection. Vascular: There is atherosclerotic calcification of the major vessels at the base of the brain. Skull: Negative Sinuses/Orbits: Clear/normal Other: None IMPRESSION: No acute CT finding. Brain atrophy, possibly with temporal lobe predominance. Electronically Signed   By: Nelson Chimes M.D.   On: 03/26/2021 22:44    Scheduled Meds:  diltiazem  120 mg Oral Daily   hydrocortisone cream   Topical TID  levothyroxine  88 mcg Oral Q0600   memantine  5 mg Oral BID   nirmatrelvir/ritonavir EUA (renal dosing)  2 tablet Oral BID   predniSONE  40 mg Oral Q breakfast   Continuous Infusions:  azithromycin Stopped (03/27/21 0249)   cefTRIAXone (ROCEPHIN)  IV Stopped (03/27/21 0059)   lactated ringers 100 mL/hr at 03/27/21 0714     LOS: 0 days   Shelly Coss, MD [facility] P2/25/2023, 11:14 AM

## 2021-03-28 DIAGNOSIS — G9341 Metabolic encephalopathy: Secondary | ICD-10-CM | POA: Diagnosis not present

## 2021-03-28 LAB — CBC WITH DIFFERENTIAL/PLATELET
Abs Immature Granulocytes: 0.08 10*3/uL — ABNORMAL HIGH (ref 0.00–0.07)
Basophils Absolute: 0 10*3/uL (ref 0.0–0.1)
Basophils Relative: 0 %
Eosinophils Absolute: 0 10*3/uL (ref 0.0–0.5)
Eosinophils Relative: 0 %
HCT: 35.6 % — ABNORMAL LOW (ref 36.0–46.0)
Hemoglobin: 12.9 g/dL (ref 12.0–15.0)
Immature Granulocytes: 1 %
Lymphocytes Relative: 3 %
Lymphs Abs: 0.3 10*3/uL — ABNORMAL LOW (ref 0.7–4.0)
MCH: 30.2 pg (ref 26.0–34.0)
MCHC: 36.2 g/dL — ABNORMAL HIGH (ref 30.0–36.0)
MCV: 83.4 fL (ref 80.0–100.0)
Monocytes Absolute: 0.5 10*3/uL (ref 0.1–1.0)
Monocytes Relative: 4 %
Neutro Abs: 12.3 10*3/uL — ABNORMAL HIGH (ref 1.7–7.7)
Neutrophils Relative %: 92 %
Platelets: 304 10*3/uL (ref 150–400)
RBC: 4.27 MIL/uL (ref 3.87–5.11)
RDW: 14.9 % (ref 11.5–15.5)
WBC: 13.3 10*3/uL — ABNORMAL HIGH (ref 4.0–10.5)
nRBC: 0 % (ref 0.0–0.2)

## 2021-03-28 LAB — BASIC METABOLIC PANEL
Anion gap: 8 (ref 5–15)
BUN: 20 mg/dL (ref 8–23)
CO2: 28 mmol/L (ref 22–32)
Calcium: 8.2 mg/dL — ABNORMAL LOW (ref 8.9–10.3)
Chloride: 98 mmol/L (ref 98–111)
Creatinine, Ser: 0.72 mg/dL (ref 0.44–1.00)
GFR, Estimated: >60 mL/min (ref >60)
Glucose, Bld: 119 mg/dL — ABNORMAL HIGH (ref 70–99)
Potassium: 3.2 mmol/L — ABNORMAL LOW (ref 3.5–5.1)
Sodium: 134 mmol/L — ABNORMAL LOW (ref 135–145)

## 2021-03-28 LAB — PROCALCITONIN: Procalcitonin: <0.1 ng/mL

## 2021-03-28 MED ORDER — NIRMATRELVIR/RITONAVIR (PAXLOVID)TABLET
3.0000 | ORAL_TABLET | Freq: Two times a day (BID) | ORAL | Status: AC
  Administered 2021-03-28 – 2021-03-31 (×6): 3 via ORAL
  Filled 2021-03-28: qty 30

## 2021-03-28 MED ORDER — POTASSIUM CHLORIDE CRYS ER 20 MEQ PO TBCR
40.0000 meq | EXTENDED_RELEASE_TABLET | Freq: Once | ORAL | Status: AC
  Administered 2021-03-28: 09:00:00 40 meq via ORAL
  Filled 2021-03-28: qty 2

## 2021-03-28 NOTE — Evaluation (Signed)
Physical Therapy Evaluation Patient Details Name: Lisa Martin MRN: [id number] DOB: 12-Jun-1931 Today's Date: 03/28/2021  History of Present Illness  Pt is an 88 y/o F w/ PMH: advanced dementia, hypothyroidism, hypertension, recent kpyohplasty at T7 on 1/30, compression fx to T8 on XR on 2/14 w/ plan for upcoming kyphoplasty who presented to ED via EMS d/t AMS and was found to be COVID +. Adm for sepsis r/t PNA.   Clinical Impression  Patient received in bed, very fidgety. Removing gown, wrap for IV. Requires assist for re-direction and to put gown back on. Sitter present in room. She is mod independent with supine<>sit. Needs min assist for sit to stand at edge of bed and is able to take a couple of shuffle steps at edge of bed with min assist. Patient then returned to bed. She does not want to do more at this time. Reports she is cold. Patient will continue to benefit from skilled PT while here to improve functional independence and safety.           Recommendations for follow up therapy are one component of a multi-disciplinary discharge planning process, led by the attending physician.  Recommendations may be updated based on patient status, additional functional criteria and insurance authorization.  Follow Up Recommendations Skilled nursing-short term rehab (<3 hours/day)    Assistance Recommended at Discharge Frequent or constant Supervision/Assistance  Patient can return home with the following  A little help with walking and/or transfers;A little help with bathing/dressing/bathroom;Help with stairs or ramp for entrance;Direct supervision/assist for medications management    Equipment Recommendations Rolling walker (2 wheels)  Recommendations for Other Services       Functional Status Assessment Patient has had a recent decline in their functional status and demonstrates the ability to make significant improvements in function in a reasonable and predictable amount of time.      Precautions / Restrictions Precautions Precautions: Fall Restrictions Weight Bearing Restrictions: No Other Position/Activity Restrictions: no precautions listed in Dr. Corliss Blacker (with radiology) who performed recent T7 kypho and had recommened T8 kypho.      Mobility  Bed Mobility Overal bed mobility: Modified Independent Bed Mobility: Supine to Sit, Sit to Supine     Supine to sit: HOB elevated, Modified independent (Device/Increase time) Sit to supine: Modified independent (Device/Increase time)   General bed mobility comments: patient able to get to edge of bed without physical assist.    Transfers Overall transfer level: Needs assistance Equipment used: 1 person hand held assist Transfers: Sit to/from Stand Sit to Stand: Min assist           General transfer comment: able to stand at edge fo bed and take a few shuffle side steps.    Ambulation/Gait Ambulation/Gait assistance: Min assist Gait Distance (Feet): 2 Feet Assistive device: 1 person hand held assist Gait Pattern/deviations: Step-to pattern, Decreased step length - right, Decreased step length - left, Shuffle       General Gait Details: patient able to take a few steps, but wants to get back in bed.  Stairs            Wheelchair Mobility    Modified Rankin (Stroke Patients Only)       Balance Overall balance assessment: Needs assistance Sitting-balance support: Feet supported Sitting balance-Leahy Scale: Fair     Standing balance support: Single extremity supported, During functional activity Standing balance-Leahy Scale: Poor  Pertinent Vitals/Pain Pain Assessment Pain Assessment: No/denies pain Breathing: normal Negative Vocalization: none Facial Expression: smiling or inexpressive Body Language: relaxed Consolability: no need to console PAINAD Score: 0 Pain Intervention(s): Monitored during session    Home Living                      Additional Comments: patient poor historian, unsure of where she lives. She told me she lives with her husband    Prior Function Prior Level of Function : Patient poor historian/Family not available             Mobility Comments: reports being INDEP at baseline       Hand Dominance        Extremity/Trunk Assessment   Upper Extremity Assessment Upper Extremity Assessment: Defer to OT evaluation    Lower Extremity Assessment Lower Extremity Assessment: Generalized weakness    Cervical / Trunk Assessment Cervical / Trunk Assessment: Kyphotic  Communication   Communication: No difficulties  Cognition Arousal/Alertness: Awake/alert Behavior During Therapy: WFL for tasks assessed/performed Overall Cognitive Status: No family/caregiver present to determine baseline cognitive functioning Area of Impairment: Orientation, Problem solving, Following commands, Safety/judgement, Awareness                 Orientation Level: Disoriented to, Time, Situation   Memory: Decreased short-term memory Following Commands: Follows one step commands inconsistently, Follows one step commands with increased time Safety/Judgement: Decreased awareness of safety, Decreased awareness of deficits Awareness: Intellectual Problem Solving: Slow processing, Requires tactile cues, Requires verbal cues, Difficulty sequencing General Comments: unsure of baseilne cognition as pt with no family member present at time of eval, but chart indicates that daughter reported pt's current cognitive status as different than baseline.        General Comments      Exercises     Assessment/Plan    PT Assessment Patient needs continued PT services  PT Problem List Decreased strength;Decreased mobility;Decreased safety awareness;Decreased activity tolerance;Decreased balance;Decreased cognition       PT Treatment Interventions Gait training;Functional mobility training;Therapeutic  activities;Patient/family education;Cognitive remediation    PT Goals (Current goals can be found in the Care Plan section)  Acute Rehab PT Goals Patient Stated Goal: none stated PT Goal Formulation: Patient unable to participate in goal setting Time For Goal Achievement: 04/12/21    Frequency Min 2X/week     Co-evaluation               AM-PAC PT "6 Clicks" Mobility  Outcome Measure Help needed turning from your back to your side while in a flat bed without using bedrails?: A Little Help needed moving from lying on your back to sitting on the side of a flat bed without using bedrails?: A Little Help needed moving to and from a bed to a chair (including a wheelchair)?: A Lot Help needed standing up from a chair using your arms (e.g., wheelchair or bedside chair)?: A Lot Help needed to walk in hospital room?: A Lot Help needed climbing 3-5 steps with a railing? : Total 6 Click Score: 13    End of Session Equipment Utilized During Treatment: Gait belt Activity Tolerance: Other (comment) (limited by cognition) Patient left: in bed;with call bell/phone within reach;with bed alarm set;with nursing/sitter in room Nurse Communication: Mobility status PT Visit Diagnosis: Unsteadiness on feet (R26.81);Other abnormalities of gait and mobility (R26.89);Muscle weakness (generalized) (M62.81);Difficulty in walking, not elsewhere classified (R26.2)    Time: 7741-2878 PT Time Calculation (min) (ACUTE ONLY): 16  min   Charges:   PT Evaluation $PT Eval Moderate Complexity: 1 Mod          Emeterio Balke, PT, GCS 03/28/21,10:06 AM

## 2021-03-28 NOTE — Progress Notes (Signed)
Order for telesitter in place. Have attempted telesitter AVA04, AVA01, and AVA03 and none of the machines are giving feedback to the telesitter even though things appear to be working on our side. CN Phyllis aware and [facility] aware. Notifying IT to see if they can help. Pt resting peacefully in bed at this time.

## 2021-03-28 NOTE — Clinical Social Work Note (Signed)
RE:  Lisa Martin   Date of Birth:   08-12-2033___________  Date:  03/28/21      To Whom It May Concern:  Please be advised that the above-named patient will require a short-term nursing home stay - anticipated 30 days or less for rehabilitation and strengthening.  The plan is for return home.   MD electronic signature noted below

## 2021-03-28 NOTE — Progress Notes (Signed)
Mitts placed on pt at this time to prevent pt from pulling at IV and telemetry leads.

## 2021-03-28 NOTE — TOC Initial Note (Signed)
Transition of Care [facility]) - Initial/Assessment Note    Patient Details  Name: Lisa Martin MRN: [id number] Date of Birth: 1931-02-18  Transition of [facility]) CM/SW Contact:    Eileen Stanford, LCSW Phone Number: 03/28/2021, 11:23 AM  Clinical Narrative:      CSW spoke with pt's daughter and she is agreeable for pt to go to SNF. Pt's daughter would prefer it be in [place] or [place] will send referral and provide b/o once available. CSW explained to tp's daughter that pt would have to be here 10 days post positive test to quarantine before dc to SNF--pt's daughter aware. MD aware.              PASRR pending- clincials uploaded.  Expected Discharge Plan: Skilled Nursing Facility Barriers to Discharge: Continued Medical Work up   Patient Goals and CMS Choice Patient states their goals for this hospitalization and ongoing recovery are:: for pt to get stronger   Choice offered to / list presented to : Adult Children  Expected Discharge Plan and Services Expected Discharge Plan: [place] Acute Care Choice: [place] arrangements for the past 2 months: Single Family Home                                      Prior Living Arrangements/Services Living arrangements for the past 2 months: Single Family Home   Patient language and need for interpreter reviewed:: Yes Do you feel safe going back to the place where you live?: Yes      Need for Family Participation in Patient Care: Yes (Comment) Care giver support system in place?: Yes (comment)   Criminal Activity/Legal Involvement Pertinent to Current Situation/Hospitalization: No - Comment as needed  Activities of Daily Living   ADL Screening (condition at time of admission) Is the patient deaf or have difficulty hearing?: No  Permission Sought/Granted Permission sought to share information with : Family Supports Permission granted to share information with : Yes,  Release of Information Signed  Share Information with NAME: Vaughan Basta     Permission granted to share info w Relationship: daughter     Emotional Assessment Appearance:: Appears stated age Attitude/Demeanor/Rapport: Unable to Assess Affect (typically observed): Unable to Assess Orientation: : Oriented to Self Alcohol / Substance Use: Not Applicable Psych Involvement: No (comment)  Admission diagnosis:  Altered mental status, unspecified altered mental status type [E36.62] Acute metabolic encephalopathy [H47.65] Community acquired pneumonia of left lower lobe of lung [J18.9] Sepsis with encephalopathy without septic shock, due to unspecified organism ([place]) [A41.9, R65.20, G93.40] COVID-19 [U07.1] Patient Active Problem List   Diagnosis Date Noted   Acute metabolic encephalopathy 46/50/3546   Severe sepsis ([place]) 03/27/2021   Community acquired pneumonia 03/27/2021   Incidental finding of COVID-19 virus infection 03/27/2021   Dehydration 03/27/2021   Acute prerenal azotemia 03/27/2021   Personal history of malignant neoplasm of breast 05/08/2013   Hypertension 2010   GERD (gastroesophageal reflux disease) 2010   Acquired hypothyroidism 2010   PCP:  Rusty Aus, MD Pharmacy:   [facility] [address], [place] - [address] [place] [address] Phone: [phone] Fax: [id number] [place], [place] - [address] [place] [address] Phone: [phone] Fax: [phone]  Social Determinants of Health (SDOH) Interventions    Readmission Risk Interventions No flowsheet data found.

## 2021-03-28 NOTE — Progress Notes (Signed)
PROGRESS NOTE  Lisa Martin  LFY:[id number] DOB: 05-24-1931 DOA: 03/26/2021 PCP: Rusty Aus, MD   Brief Narrative:  Patient is a 88 year old female with history of advanced dementia, hypothyroidism, hypertension who was brought to home to the emergency department for the evaluation of altered mental status.  She was also having nonproductive cough, developed maculopapular rash on her chest and abdomen.  Patient recently had a T7 kyphoplasty on 03/01/2021 and has been planned for another T8 kyphoplasty on 04/02/2021.  On presentation she was hemodynamically stable.  COVID screening test came out to be positive.  Chest x-ray showed left lower lobe infiltrate consistent with pneumonia.  CT head did not show any acute intracranial process.  Patient was admitted for the management of community-acquired pneumonia.  Overall doing better.  PT/OT recommending skilled facility on discharge.  Medically stable for discharge as soon as bed is available at skilled nursing facility.  Assessment & Plan:  Principal Problem:   Acute metabolic encephalopathy Active Problems:   Severe sepsis ([place])   Community acquired pneumonia   Incidental finding of COVID-19 virus infection   Dehydration   Acute prerenal azotemia   Hypertension   GERD (gastroesophageal reflux disease)   Acquired hypothyroidism   Altered mental status: Secondary to acute metabolic encephalopathy from pneumonia/COVID.  Patient has history of advanced dementia.  She is oriented to place only.  Monitor mental status.  Continue supportive care, delirium precautions. Takes Xanax, Zoloft, continued home, currently on hold.  Sepsis: Suspected sepsis secondary to pneumonia.  Presented with cough, confusion, chest x-ray showing pneumonia.  Lab work showed leukocytosis.  Follow-up cultures.  Urine culture showing 10,000 colonies of E. coli  Community-acquired pneumonia: Chest x-ray was suggestive of left lower lobe infiltrate.  No evidence of  groundglass opacity, bilateral infiltrates which is typical of viral pneumonia. Started on azithromycin and ceftriaxone.  Currently on room air  COVID positive: Currently on room air.  Not a started on steroids.  She was not hypoxic.  Started on Paxlovid.  Dehydration: Found to be dehydrated on presentation,elevated CK.  Started on IV fluids  Hypertension: Currently blood pressure stable.  On diltiazem which has been continued.  Hypothyroidism:Continue Synthyroid  Hypokalemia: Supplemented with potassium  History of generalized anxiety disorder: On Zoloft, Xanax.  Currently on hold due to encephalopathy  GERD: Continue PPI  Rash: Patient has erythematous maculopapular rash scattered on the chest, abdomen.  Continue hydrocortisone cream, prednisone.  Etiology not clear.  Continue Benadryl for severe itching  Debility/deconditioning/compression fractures: PT/OT recommending skilled nursing facility on discharge.Uses cane for walking,lives with daughter. Patient has compression fracture.  She underwent T7 kyphoplasty on 03/01/2021.  T8 kyphoplasty has been planned on 04/02/2021         DVT prophylaxis:SCDs Start: 03/27/21 0036     Code Status: Full Code  Family Communication:: Discussed with daughter on 2/25 on phone  Patient status:inpatient  Patient is from :Home  Anticipated discharge to::SNF  Estimated DC date: Medically stable for discharge whenever possible to place in the skilled nursing facility   Consultants: None  Procedures:None  Antimicrobials:  Anti-infectives (From admission, onward)    Start     Dose/Rate Route Frequency Ordered Stop   03/27/21 0115  nirmatrelvir/ritonavir EUA (renal dosing) (PAXLOVID) 2 tablet        2 tablet Oral 2 times daily 03/27/21 0106 03/31/21 2159   03/26/21 2300  cefTRIAXone (ROCEPHIN) 2 g in sodium chloride 0.9 % 100 mL IVPB  2 g 200 mL/hr over 30 Minutes Intravenous Every 24 hours 03/26/21 2258 03/31/21 2259    03/26/21 2300  azithromycin (ZITHROMAX) 500 mg in sodium chloride 0.9 % 250 mL IVPB        500 mg 250 mL/hr over 60 Minutes Intravenous Every 24 hours 03/26/21 2258 03/31/21 2259      Subjective:  Patient seen and examined at the bedside this morning.  Hemodynamically stable.  She was impulsive and agitated last night so sitter was at the bedside.  She was not in any kind of distress.  On room air   Objective: Vitals:   03/27/21 1145 03/27/21 1355 03/27/21 1608 03/27/21 2119  BP: (!) 147/83 (!) 142/91 136/74 (!) 150/79  Pulse:  69 72 81  Resp:  (!) 21 20 20   Temp: 98.6 F (37 C)  (!) 97.4 F (36.3 C) 98.3 F (36.8 C)  TempSrc: Axillary  Other (Comment) Axillary  SpO2:  100% 95% 99%  Weight:      Height:        Intake/Output Summary (Last 24 hours) at 03/28/2021 0738 Last data filed at 03/28/2021 0300 Gross per 24 hour  Intake 1546.4 ml  Output --  Net 1546.4 ml   Filed Weights   03/26/21 2137  Weight: 39.5 kg    Examination:   General exam: Very deconditioned, frail HEENT: PERRL Respiratory system:  no wheezes or crackles  Cardiovascular system: S1 & S2 heard, RRR.  Gastrointestinal system: Abdomen is nondistended, soft and nontender. Central nervous system: Alert and awake oriented to place only Extremities: No edema, no clubbing ,no cyanosis Skin: Improving maculopapular rash on the chest and abdomen   Data Reviewed: I have personally reviewed following labs and imaging studies  CBC: Recent Labs  Lab 03/26/21 2236 03/27/21 0406 03/28/21 0612  WBC 17.4* 15.2* 13.3*  NEUTROABS 15.7* 13.4* 12.3*  HGB 14.5 14.2 12.9  HCT 41.9 40.4 35.6*  MCV 85.9 85.1 83.4  PLT 360 353 094   Basic Metabolic Panel: Recent Labs  Lab 03/26/21 2236 03/27/21 0406 03/28/21 0612  NA 134* 135 134*  K 3.8 3.8 3.2*  CL 100 96* 98  CO2 25 26 28   GLUCOSE 132* 110* 119*  BUN 26* 17 20  CREATININE 0.87 0.74 0.72  CALCIUM 9.0 8.7* 8.2*  MG 2.0 1.6*  --   PHOS  --  2.7   --      Recent Results (from the past 240 hour(s))  Resp Panel by RT-PCR (Flu A&B, Covid) Nasopharyngeal Swab     Status: Abnormal   Collection Time: 03/26/21 10:36 PM   Specimen: Nasopharyngeal Swab; Nasopharyngeal(NP) swabs in vial transport medium  Result Value Ref Range Status   SARS Coronavirus 2 by RT PCR POSITIVE (A) NEGATIVE Final    Comment: (NOTE) SARS-CoV-2 target nucleic acids are DETECTED.  The SARS-CoV-2 RNA is generally detectable in upper respiratory specimens during the acute phase of infection. Positive results are indicative of the presence of the identified virus, but do not rule out bacterial infection or co-infection with other pathogens not detected by the test. Clinical correlation with patient history and other diagnostic information is necessary to determine patient infection status. The expected result is Negative.  Fact Sheet for Patients: EntrepreneurPulse.com.au  Fact Sheet for Healthcare Providers: IncredibleEmployment.be  This test is not yet approved or cleared by the [place] FDA and  has been authorized for detection and/or diagnosis of SARS-CoV-2 by FDA under an Emergency Use Authorization (EUA).  This  EUA will remain in effect (meaning this test can be used) for the duration of  the COVID-19 declaration under Section 564(b)(1) of the A ct, 21 U.S.C. section 360bbb-3(b)(1), unless the authorization is terminated or revoked sooner.     Influenza A by PCR NEGATIVE NEGATIVE Final   Influenza B by PCR NEGATIVE NEGATIVE Final    Comment: (NOTE) The Xpert Xpress SARS-CoV-2/FLU/RSV plus assay is intended as an aid in the diagnosis of influenza from Nasopharyngeal swab specimens and should not be used as a sole basis for treatment. Nasal washings and aspirates are unacceptable for Xpert Xpress SARS-CoV-2/FLU/RSV testing.  Fact Sheet for Patients: EntrepreneurPulse.com.au  Fact Sheet  for Healthcare Providers: IncredibleEmployment.be  This test is not yet approved or cleared by the [place] FDA and has been authorized for detection and/or diagnosis of SARS-CoV-2 by FDA under an Emergency Use Authorization (EUA). This EUA will remain in effect (meaning this test can be used) for the duration of the COVID-19 declaration under Section 564(b)(1) of the Act, 21 U.S.C. section 360bbb-3(b)(1), unless the authorization is terminated or revoked.  Performed at [facility], [address] [place], [place] [address]   Blood Culture (routine x 2)     Status: None (Preliminary result)   Collection Time: 03/26/21 11:08 PM   Specimen: Left Antecubital; Blood  Result Value Ref Range Status   Specimen Description LEFT ANTECUBITAL  Final   Special Requests   Final    BOTTLES DRAWN AEROBIC AND ANAEROBIC Blood Culture adequate volume   Culture   Final    NO GROWTH 1 DAY Performed at [facility], [address] [place], [place] [address]    Report Status PENDING  Incomplete  Blood Culture (routine x 2)     Status: None (Preliminary result)   Collection Time: 03/26/21 11:09 PM   Specimen: Right Antecubital; Blood  Result Value Ref Range Status   Specimen Description RIGHT ANTECUBITAL  Final   Special Requests   Final    BOTTLES DRAWN AEROBIC AND ANAEROBIC Blood Culture results may not be optimal due to an inadequate volume of blood received in culture bottles   Culture   Final    NO GROWTH 1 DAY Performed at [facility], [address], [place], [place] [address]    Report Status PENDING  Incomplete     Radiology Studies: DG Chest 2 View  Result Date: 03/26/2021 CLINICAL DATA:  Tachypnea. EXAM: CHEST - 2 VIEW COMPARISON:  10/21/2015 FINDINGS: Patient has taken a poor inspiration. There is newly seen infiltrate in the lower lungs, best seen on the lateral view, probably present in the left lower. No visible effusion. Old  traumatic deformity of the left ribs. Old corpectomy and fusion in the lower thoracic spine. Old augmented fracture in the midthoracic spine. Un augmented fracture medially inferior to that. This appears to have worsened compared to thoracic radiography of 2 weeks ago. IMPRESSION: Basilar pneumonia, probably on the left. This is best seen on the lateral view. Further progression of collapse of a thoracic vertebral body, presumably T8, immediately below the previously augmented level. This is compared to study of just 14 days ago. Electronically Signed   By: Nelson Chimes M.D.   On: 03/26/2021 22:40   CT HEAD WO CONTRAST (5MM)  Result Date: 03/26/2021 CLINICAL DATA:  Mental status changes of unknown cause. Generalized weakness. EXAM: CT HEAD WITHOUT CONTRAST TECHNIQUE: Contiguous axial images were obtained from the base of the skull through the vertex  without intravenous contrast. RADIATION DOSE REDUCTION: This exam was performed according to the departmental dose-optimization program which includes automated exposure control, adjustment of the mA and/or kV according to patient size and/or use of iterative reconstruction technique. COMPARISON:  06/24/2020 FINDINGS: Brain: Generalized atrophy. Possible temporal lobe predominance. Chronic small-vessel ischemic changes of the cerebral hemispheric white matter. No evidence of acute infarction, mass lesion, hemorrhage, hydrocephalus or extra-axial collection. Vascular: There is atherosclerotic calcification of the major vessels at the base of the brain. Skull: Negative Sinuses/Orbits: Clear/normal Other: None IMPRESSION: No acute CT finding. Brain atrophy, possibly with temporal lobe predominance. Electronically Signed   By: Nelson Chimes M.D.   On: 03/26/2021 22:44    Scheduled Meds:  chlorhexidine  15 mL Mouth Rinse BID   diltiazem  120 mg Oral Daily   hydrocortisone cream   Topical TID   levothyroxine  88 mcg Oral Q0600   mouth rinse  15 mL Mouth Rinse q12n4p    memantine  5 mg Oral BID   nirmatrelvir/ritonavir EUA (renal dosing)  2 tablet Oral BID   potassium chloride  40 mEq Oral Once   predniSONE  40 mg Oral Q breakfast   Continuous Infusions:  azithromycin 500 mg (03/27/21 2215)   cefTRIAXone (ROCEPHIN)  IV 2 g (03/27/21 2128)     LOS: 1 day   Shelly Coss, MD [facility] P2/26/2023, 7:38 AM

## 2021-03-28 NOTE — NC FL2 (Signed)
[place] LEVEL OF CARE SCREENING TOOL     IDENTIFICATION  Patient Name: Lisa Martin Birthdate: [birth year] Sex: female Admission Date (Current Location): 03/26/2021  [facility] and [place] Number:  Engineering geologist and Address:  [facility], [address], [place], [place] [address]      Provider Number: [id number]  Attending Physician Name and Address:  Shelly Coss, MD  Relative Name and Phone Number:       Current Level of Care: Hospital Recommended Level of Care: [place] Prior Approval Number:    Date Approved/Denied:   PASRR Number:    Discharge Plan: SNF    Current Diagnoses: Patient Active Problem List   Diagnosis Date Noted   Acute metabolic encephalopathy 81/19/1478   Severe sepsis ([place]) 03/27/2021   Community acquired pneumonia 03/27/2021   Incidental finding of COVID-19 virus infection 03/27/2021   Dehydration 03/27/2021   Acute prerenal azotemia 03/27/2021   Personal history of malignant neoplasm of breast 05/08/2013   Hypertension 2010   GERD (gastroesophageal reflux disease) 2010   Acquired hypothyroidism 2010    Orientation RESPIRATION BLADDER Height & Weight     Self  Normal Incontinent Weight: 87 lb 1.3 oz (39.5 kg) Height:  5' (152.4 cm)  BEHAVIORAL SYMPTOMS/MOOD NEUROLOGICAL BOWEL NUTRITION STATUS      Continent Diet (regular diet, thin liquids)  AMBULATORY STATUS COMMUNICATION OF NEEDS Skin   Limited Assist Verbally Normal                       Personal Care Assistance Level of Assistance  Bathing, Feeding, Dressing Bathing Assistance: Limited assistance Feeding assistance: Independent Dressing Assistance: Limited assistance     Functional Limitations Info  Sight, Hearing, Speech Sight Info: Adequate Hearing Info: Adequate Speech Info: Adequate    SPECIAL CARE FACTORS FREQUENCY  PT (By licensed PT), OT (By licensed OT)     PT Frequency: 5x OT Frequency: 5x             Contractures Contractures Info: Not present    Additional Factors Info  Code Status, Allergies, Isolation Precautions Code Status Info: full code Allergies Info: none on file     Isolation Precautions Info: air/con pre covid positive on 2/23     Current Medications (03/28/2021):  This is the current hospital active medication list Current Facility-Administered Medications  Medication Dose Route Frequency Provider Last Rate Last Admin   acetaminophen (TYLENOL) tablet 650 mg  650 mg Oral Q6H PRN Howerter, Justin B, DO       Or   acetaminophen (TYLENOL) suppository 650 mg  650 mg Rectal Q6H PRN Howerter, Justin B, DO       albuterol (VENTOLIN HFA) 108 (90 Base) MCG/ACT inhaler 1-2 puff  1-2 puff Inhalation Q4H PRN Howerter, Justin B, DO       azithromycin (ZITHROMAX) 500 mg in sodium chloride 0.9 % 250 mL IVPB  500 mg Intravenous Q24H Vladimir Crofts, MD 250 mL/hr at 03/27/21 2215 500 mg at 03/27/21 2215   cefTRIAXone (ROCEPHIN) 2 g in sodium chloride 0.9 % 100 mL IVPB  2 g Intravenous Q24H Vladimir Crofts, MD 200 mL/hr at 03/27/21 2128 2 g at 03/27/21 2128   chlorhexidine (PERIDEX) 0.12 % solution 15 mL  15 mL Mouth Rinse BID Howerter, Justin B, DO   15 mL at 03/28/21 0909   diltiazem (CARDIZEM CD) 24 hr capsule 120 mg  120 mg Oral Daily Howerter, Justin B, DO  120 mg at 03/28/21 0909   diphenhydrAMINE (BENADRYL) capsule 25 mg  25 mg Oral Q8H PRN Shelly Coss, MD   25 mg at 03/27/21 2426   hydrocortisone cream 1 %   Topical TID Shelly Coss, MD   Given at 03/28/21 0910   levothyroxine (SYNTHROID) tablet 88 mcg  88 mcg Oral Q0600 Howerter, Larkin Ina B, DO   88 mcg at 03/28/21 8341   MEDLINE mouth rinse  15 mL Mouth Rinse q12n4p Howerter, Justin B, DO   15 mL at 03/27/21 1727   memantine (NAMENDA) tablet 5 mg  5 mg Oral BID Howerter, Justin B, DO   5 mg at 03/28/21 9622   naloxone (NARCAN) injection 0.4 mg  0.4 mg Intravenous PRN Howerter, Justin B, DO       nirmatrelvir/ritonavir  EUA (renal dosing) (PAXLOVID) 2 tablet  2 tablet Oral BID Dallie Piles, [facility]   2 tablet at 03/28/21 2979   oxyCODONE (Oxy IR/ROXICODONE) immediate release tablet 5 mg  5 mg Oral Q6H PRN Howerter, Justin B, DO       predniSONE (DELTASONE) tablet 40 mg  40 mg Oral Q breakfast Shelly Coss, MD   40 mg at 03/28/21 0908     Discharge Medications: Please see discharge summary for a list of discharge medications.  Relevant Imaging Results:  Relevant Lab Results:   Additional Information [id number]  Gerrianne Scale Bevelyn Arriola, LCSW

## 2021-03-28 NOTE — Progress Notes (Signed)
PHARMACY NOTE:  RENAL DOSAGE ADJUSTMENT  Current antiviral regimen includes a mismatch between dosage and estimated renal function, GFR.  As per policy approved by the Pharmacy & Therapeutics and Medical Executive Committees, the dosage will be adjusted accordingly.  Current antiviral dosage:   Paxlovid 2 tabs po BID  Indication: Covid  Renal Function: GFR is > 60 ml/min     Antiviral dosage has been changed to:   Paxlovid 3 tabs PO BID to complete a total of 5 days  Additional comments:   Thank you for allowing pharmacy to be a part of this patient's care.  [place], [facility] 03/28/2021 3:02 PM

## 2021-03-29 DIAGNOSIS — G9341 Metabolic encephalopathy: Secondary | ICD-10-CM | POA: Diagnosis not present

## 2021-03-29 LAB — CBC WITH DIFFERENTIAL/PLATELET
Abs Immature Granulocytes: 0.17 10*3/uL — ABNORMAL HIGH (ref 0.00–0.07)
Basophils Absolute: 0 10*3/uL (ref 0.0–0.1)
Basophils Relative: 0 %
Eosinophils Absolute: 0 10*3/uL (ref 0.0–0.5)
Eosinophils Relative: 0 %
HCT: 38.1 % (ref 36.0–46.0)
Hemoglobin: 13.5 g/dL (ref 12.0–15.0)
Immature Granulocytes: 1 %
Lymphocytes Relative: 2 %
Lymphs Abs: 0.3 10*3/uL — ABNORMAL LOW (ref 0.7–4.0)
MCH: 30 pg (ref 26.0–34.0)
MCHC: 35.4 g/dL (ref 30.0–36.0)
MCV: 84.7 fL (ref 80.0–100.0)
Monocytes Absolute: 1 10*3/uL (ref 0.1–1.0)
Monocytes Relative: 5 %
Neutro Abs: 17.6 10*3/uL — ABNORMAL HIGH (ref 1.7–7.7)
Neutrophils Relative %: 92 %
Platelets: 395 10*3/uL (ref 150–400)
RBC: 4.5 MIL/uL (ref 3.87–5.11)
RDW: 15.4 % (ref 11.5–15.5)
WBC: 19.2 10*3/uL — ABNORMAL HIGH (ref 4.0–10.5)
nRBC: 0 % (ref 0.0–0.2)

## 2021-03-29 LAB — URINE CULTURE: Culture: 10000 — AB

## 2021-03-29 LAB — BASIC METABOLIC PANEL
Anion gap: 17 — ABNORMAL HIGH (ref 5–15)
BUN: 23 mg/dL (ref 8–23)
CO2: 26 mmol/L (ref 22–32)
Calcium: 8.7 mg/dL — ABNORMAL LOW (ref 8.9–10.3)
Chloride: 95 mmol/L — ABNORMAL LOW (ref 98–111)
Creatinine, Ser: 0.92 mg/dL (ref 0.44–1.00)
GFR, Estimated: >60 mL/min (ref >60)
Glucose, Bld: 96 mg/dL (ref 70–99)
Potassium: 3.7 mmol/L (ref 3.5–5.1)
Sodium: 138 mmol/L (ref 135–145)

## 2021-03-29 LAB — GLUCOSE, CAPILLARY: Glucose-Capillary: 105 mg/dL — ABNORMAL HIGH (ref 70–99)

## 2021-03-29 LAB — CK: Total CK: 659 U/L — ABNORMAL HIGH (ref 38–234)

## 2021-03-29 MED ORDER — PREDNISONE 20 MG PO TABS
20.0000 mg | ORAL_TABLET | Freq: Every day | ORAL | Status: AC
  Administered 2021-03-29 – 2021-03-30 (×2): 20 mg via ORAL
  Filled 2021-03-29: qty 1

## 2021-03-29 MED ORDER — GABAPENTIN 100 MG PO CAPS
100.0000 mg | ORAL_CAPSULE | Freq: Three times a day (TID) | ORAL | Status: DC
Start: 2021-03-29 — End: 2021-04-06
  Administered 2021-03-29 – 2021-04-06 (×26): 100 mg via ORAL
  Filled 2021-03-29 (×26): qty 1

## 2021-03-29 MED ORDER — ALPRAZOLAM 0.25 MG PO TABS
0.2500 mg | ORAL_TABLET | Freq: Every evening | ORAL | Status: DC | PRN
  Administered 2021-03-29 – 2021-04-04 (×5): 0.25 mg via ORAL
  Filled 2021-03-29 (×5): qty 1

## 2021-03-29 MED ORDER — SERTRALINE HCL 50 MG PO TABS
25.0000 mg | ORAL_TABLET | Freq: Every day | ORAL | Status: DC
  Administered 2021-03-29 – 2021-04-06 (×9): 25 mg via ORAL
  Filled 2021-03-29 (×9): qty 1

## 2021-03-29 MED ORDER — AZITHROMYCIN 500 MG PO TABS
500.0000 mg | ORAL_TABLET | Freq: Every day | ORAL | Status: AC
  Administered 2021-03-30 – 2021-03-31 (×2): 500 mg via ORAL
  Filled 2021-03-29 (×2): qty 1

## 2021-03-29 MED ORDER — ENOXAPARIN SODIUM 30 MG/0.3ML IJ SOSY
30.0000 mg | PREFILLED_SYRINGE | INTRAMUSCULAR | Status: DC
  Administered 2021-03-30 – 2021-04-05 (×6): 30 mg via SUBCUTANEOUS
  Filled 2021-03-29 (×8): qty 0.3

## 2021-03-29 MED ORDER — HYDROCORTISONE 1 % EX CREA
TOPICAL_CREAM | Freq: Three times a day (TID) | CUTANEOUS | Status: DC | PRN
  Filled 2021-03-29: qty 28

## 2021-03-29 MED ORDER — CEFDINIR 300 MG PO CAPS
300.0000 mg | ORAL_CAPSULE | Freq: Every day | ORAL | Status: AC
  Administered 2021-03-30 – 2021-03-31 (×2): 300 mg via ORAL
  Filled 2021-03-29 (×2): qty 1

## 2021-03-29 MED ORDER — RIVASTIGMINE TARTRATE 3 MG PO CAPS
3.0000 mg | ORAL_CAPSULE | Freq: Two times a day (BID) | ORAL | Status: DC
  Administered 2021-03-29 – 2021-04-06 (×16): 3 mg via ORAL
  Filled 2021-03-29 (×18): qty 1

## 2021-03-29 MED ORDER — PREDNISONE 10 MG PO TABS
10.0000 mg | ORAL_TABLET | Freq: Every day | ORAL | Status: AC
  Administered 2021-03-31 – 2021-04-01 (×2): 10 mg via ORAL
  Filled 2021-03-29 (×2): qty 1

## 2021-03-29 NOTE — TOC Progression Note (Signed)
Transition of Care [facility]) - Progression Note    Patient Details  Name: Lisa Martin MRN: [id number] Date of Birth: January 31, 1932  Transition of Care [facility]) CM/SW [place], RN Phone Number: 03/29/2021, 1:55 PM  Clinical Narrative:   Rosalie Gums needed resubmit as GAD was not on original application  Resubmitted with clinicals.     Expected Discharge Plan: [place] Barriers to Discharge: Continued Medical Work up  Expected Discharge Plan and Services Expected Discharge Plan: [place] Choice: [place] arrangements for the past 2 months: Single Family Home                                       Social Determinants of Health (SDOH) Interventions    Readmission Risk Interventions No flowsheet data found.

## 2021-03-29 NOTE — Progress Notes (Addendum)
Patient has Air cabin crew at bedside. Patient irritable and keeps taking off her telemetry monitor. Patient refuses for staff to place monitor back on. CCMD alerted nurse, this nurse attempted to talk with patient but patient has little understanding with advanced dementia and continues to pull at wires on chest. Patient is currently awake, alert but confused to place, situation, time. Oriented to self only.   [place] chat sent to Neomia Glass about patient's refusal to wear her monitor. CCMD made aware.

## 2021-03-29 NOTE — Plan of Care (Signed)
  Problem: Clinical Measurements: Goal: Ability to maintain clinical measurements within normal limits will improve Outcome: Progressing   Problem: Clinical Measurements: Goal: Will remain free from infection Outcome: Progressing   Problem: Clinical Measurements: Goal: Diagnostic test results will improve Outcome: Progressing   

## 2021-03-29 NOTE — NC FL2 (Signed)
[place] LEVEL OF CARE SCREENING TOOL     IDENTIFICATION  Patient Name: Lisa Martin Birthdate: April 09, 1931 Sex: female Admission Date (Current Location): 03/26/2021  [facility] and [place] Number:  Engineering geologist and Address:  [facility], [address] [place], [place] [address]      Provider Number: [id number]  Attending Physician Name and Address:  Shelly Coss, MD  Relative Name and Phone Number:  Ileene Rubens (Daughter)   [phone] [facility])    Current Level of Care: Hospital Recommended Level of Care: [place] Prior Approval Number:    Date Approved/Denied:   PASRR Number: [id number]  Discharge Plan: SNF    Current Diagnoses: Patient Active Problem List   Diagnosis Date Noted   Acute metabolic encephalopathy 88/41/6606   Severe sepsis ([place]) 03/27/2021   Community acquired pneumonia 03/27/2021   Incidental finding of COVID-19 virus infection 03/27/2021   Dehydration 03/27/2021   Acute prerenal azotemia 03/27/2021   Personal history of malignant neoplasm of breast 05/08/2013   Hypertension 2010   GERD (gastroesophageal reflux disease) 2010   Acquired hypothyroidism 2010    Orientation RESPIRATION BLADDER Height & Weight     Self  Normal Incontinent Weight: 39.5 kg Height:  5' (152.4 cm)  BEHAVIORAL SYMPTOMS/MOOD NEUROLOGICAL BOWEL NUTRITION STATUS      Continent Diet (regular diet, thin liquids)  AMBULATORY STATUS COMMUNICATION OF NEEDS Skin   Limited Assist Verbally Normal                       Personal Care Assistance Level of Assistance  Bathing, Feeding, Dressing Bathing Assistance: Limited assistance Feeding assistance: Independent Dressing Assistance: Limited assistance     Functional Limitations Info  Sight, Hearing, Speech Sight Info: Adequate Hearing Info: Adequate Speech Info: Adequate    SPECIAL CARE FACTORS FREQUENCY  PT (By licensed PT), OT (By licensed OT)      PT Frequency: 5x OT Frequency: 5x            Contractures Contractures Info: Not present    Additional Factors Info  Code Status, Allergies, Isolation Precautions Code Status Info: full code Allergies Info: none on file     Isolation Precautions Info: air/con pre covid positive on 2/23     Current Medications (03/29/2021):  This is the current hospital active medication list Current Facility-Administered Medications  Medication Dose Route Frequency Provider Last Rate Last Admin   acetaminophen (TYLENOL) tablet 650 mg  650 mg Oral Q6H PRN Howerter, Justin B, DO   650 mg at 03/29/21 3016   Or   acetaminophen (TYLENOL) suppository 650 mg  650 mg Rectal Q6H PRN Howerter, Justin B, DO       albuterol (VENTOLIN HFA) 108 (90 Base) MCG/ACT inhaler 1-2 puff  1-2 puff Inhalation Q4H PRN Howerter, Justin B, DO       ALPRAZolam (XANAX) tablet 0.25 mg  0.25 mg Oral QHS PRN Shelly Coss, MD       Derrill Memo ON 03/30/2021] azithromycin (ZITHROMAX) tablet 500 mg  500 mg Oral Daily Shelly Coss, MD       Derrill Memo ON 03/30/2021] cefdinir (OMNICEF) capsule 300 mg  300 mg Oral Daily Adhikari, Amrit, MD       chlorhexidine (PERIDEX) 0.12 % solution 15 mL  15 mL Mouth Rinse BID Howerter, Justin B, DO   15 mL at 03/29/21 0752   diltiazem (CARDIZEM CD) 24 hr capsule 120 mg  120 mg Oral Daily Howerter, Larkin Ina  B, DO   120 mg at 03/29/21 0753   diphenhydrAMINE (BENADRYL) capsule 25 mg  25 mg Oral Q8H PRN Shelly Coss, MD   25 mg at 03/29/21 0439   enoxaparin (LOVENOX) injection 30 mg  30 mg Subcutaneous Q24H Adhikari, Amrit, MD       gabapentin (NEURONTIN) capsule 100 mg  100 mg Oral TID Shelly Coss, MD   100 mg at 03/29/21 1217   hydrocortisone cream 1 %   Topical Q8H PRN Shelly Coss, MD       levothyroxine (SYNTHROID) tablet 88 mcg  88 mcg Oral Q0600 Howerter, Justin B, DO   88 mcg at 03/29/21 0439   MEDLINE mouth rinse  15 mL Mouth Rinse q12n4p Howerter, Justin B, DO   15 mL at 03/29/21 1220    memantine (NAMENDA) tablet 5 mg  5 mg Oral BID Howerter, Justin B, DO   5 mg at 03/29/21 0753   naloxone (NARCAN) injection 0.4 mg  0.4 mg Intravenous PRN Howerter, Justin B, DO       nirmatrelvir/ritonavir EUA (PAXLOVID) 3 tablet  3 tablet Oral BID Noralee Space, RPH   3 tablet at 03/29/21 8546   oxyCODONE (Oxy IR/ROXICODONE) immediate release tablet 5 mg  5 mg Oral Q6H PRN Howerter, Justin B, DO   5 mg at 03/29/21 0439   [START ON 03/31/2021] predniSONE (DELTASONE) tablet 10 mg  10 mg Oral Q breakfast Adhikari, Amrit, MD       predniSONE (DELTASONE) tablet 20 mg  20 mg Oral Q breakfast Tawanna Solo, Amrit, MD   20 mg at 03/29/21 0754   rivastigmine (EXELON) capsule 3 mg  3 mg Oral BID Shelly Coss, MD   3 mg at 03/29/21 1217   sertraline (ZOLOFT) tablet 25 mg  25 mg Oral Daily Shelly Coss, MD   25 mg at 03/29/21 1218     Discharge Medications: Please see discharge summary for a list of discharge medications.  Relevant Imaging Results:  Relevant Lab Results:   Additional Information [id number]  Pete Pelt, RN

## 2021-03-29 NOTE — Progress Notes (Addendum)
PROGRESS NOTE  Lisa Martin  ERX:[id number] DOB: March 01, 1931 DOA: 03/26/2021 PCP: Rusty Aus, MD   Brief Narrative:  Patient is a 88 year old female with history of advanced dementia, hypothyroidism, hypertension who was brought to home to the emergency department for the evaluation of altered mental status.  She was also having nonproductive cough, developed maculopapular rash on her chest and abdomen.  Patient recently had a T7 kyphoplasty on 03/01/2021 and has been planned for another T8 kyphoplasty on 04/02/2021.  On presentation she was hemodynamically stable.  COVID screening test came out to be positive.  Chest x-ray showed left lower lobe infiltrate consistent with pneumonia.  CT head did not show any acute intracranial process.  Patient was admitted for the management of community-acquired pneumonia.  Overall doing better.On room air.  PT/OT recommending skilled facility on discharge.  Medically stable for discharge as soon as bed is available at skilled nursing facility.  Prolonged hospitalization due to quarantine status  Assessment & Plan:  Principal Problem:   Acute metabolic encephalopathy Active Problems:   Severe sepsis ([place])   Community acquired pneumonia   Incidental finding of COVID-19 virus infection   Dehydration   Acute prerenal azotemia   Hypertension   GERD (gastroesophageal reflux disease)   Acquired hypothyroidism   Altered mental status: Secondary to acute metabolic encephalopathy from pneumonia/COVID.  Patient has history of advanced dementia.  She is oriented to place only.  Monitor mental status.  Continue supportive care, delirium precautions.  Currently at baseline We resumed home medications.  Sepsis: Suspected sepsis secondary to pneumonia.  Presented with cough, confusion, chest x-ray showing pneumonia.  Lab work showed leukocytosis.  Blood cultures :NGTD.  Urine culture showing 10,000 colonies of pansensitive E. Coli. Her leukocytosis contributed by  steroids  Community-acquired pneumonia: Chest x-ray was suggestive of left lower lobe infiltrate.  No evidence of groundglass opacity, bilateral infiltrates which is typical of viral pneumonia. Started on azithromycin and ceftriaxone.  Currently on room air.  Chest antibiotics to oral.  COVID positive: Currently on room air.  Not a started on steroids.  She was not hypoxic.  On Paxlovid.  Dehydration: Found to be dehydrated on presentation,elevated CK.  Started on IV fluids.iv fluids d/ced  Hypertension: Currently blood pressure stable.  On diltiazem which has been continued.  Hypothyroidism:Continue Synthyroid  Hypokalemia: Supplemented and corrected  History of generalized anxiety disorder: On Zoloft, Xanax. Resumed  GERD: Continue PPI  Rash: Patient presented with erythematous maculopapular rash scattered on the chest, abdomen.  Continue hydrocortisone cream as needed , prednisone now being tapered.  Etiology not clear.  Continue Benadryl for severe itching  Dementia: Patient is disoriented at baseline.  Takes memantine , rivastigmine at home.  This have been continued.  Delirium precautions  Debility/deconditioning/compression fractures: PT/OT recommending skilled nursing facility on discharge.Uses cane for walking,lives with daughter. Patient has compression fracture.  She underwent T7 kyphoplasty on 03/01/2021.  T8 kyphoplasty has been planned on 04/02/2021         DVT prophylaxis:SCDs Start: 03/27/21 0036     Code Status: Full Code  Family Communication:: Discussed with daughter on 2/25 on phone  Patient status:inpatient  Patient is from :Home  Anticipated discharge to::SNF  Estimated DC date: Medically stable for discharge whenever possible to place in the skilled nursing facility.Needs to finish quarantine   Consultants: None  Procedures:None  Antimicrobials:  Anti-infectives (From admission, onward)    Start     Dose/Rate Route Frequency Ordered Stop    03/28/21  2200  nirmatrelvir/ritonavir EUA (PAXLOVID) 3 tablet        3 tablet Oral 2 times daily 03/28/21 1501 04/01/21 0959   03/27/21 0115  nirmatrelvir/ritonavir EUA (renal dosing) (PAXLOVID) 2 tablet  Status:  Discontinued        2 tablet Oral 2 times daily 03/27/21 0106 03/28/21 1501   03/26/21 2300  cefTRIAXone (ROCEPHIN) 2 g in sodium chloride 0.9 % 100 mL IVPB        2 g 200 mL/hr over 30 Minutes Intravenous Every 24 hours 03/26/21 2258 03/31/21 2259   03/26/21 2300  azithromycin (ZITHROMAX) 500 mg in sodium chloride 0.9 % 250 mL IVPB        500 mg 250 mL/hr over 60 Minutes Intravenous Every 24 hours 03/26/21 2258 03/31/21 2259      Subjective:  Patient seen and examined at the bedside this morning.  Hemodynamically stable.  Not in any kind of distress, as per the report, she did not sleep last night.  Objective: Vitals:   03/28/21 1954 03/28/21 2319 03/29/21 0428 03/29/21 0738  BP: (!) 122/52 128/60 (!) 153/74 (!) 158/72  Pulse: (!) 58 (!) 58 75 62  Resp: 16 16 16 20   Temp: (!) 97.5 F (36.4 C) (!) 97.5 F (36.4 C)  97.7 F (36.5 C)  TempSrc: Oral Oral    SpO2: 100% 99% 94% 90%  Weight:      Height:       No intake or output data in the 24 hours ending 03/29/21 1040  Filed Weights   03/26/21 2137  Weight: 39.5 kg    Examination:  General exam:  Deconditioned, frail HEENT: PERRL Respiratory system:  no wheezes or crackles  Cardiovascular system: S1 & S2 heard, RRR.  Gastrointestinal system: Abdomen is nondistended, soft and nontender. Central nervous system: Alert and awake but not oriented Extremities: No edema, no clubbing ,no cyanosis Skin: Improving macular rash on the trunk, no ulcers,no icterus    Data Reviewed: I have personally reviewed following labs and imaging studies  CBC: Recent Labs  Lab 03/26/21 2236 03/27/21 0406 03/28/21 0612 03/29/21 0536  WBC 17.4* 15.2* 13.3* 19.2*  NEUTROABS 15.7* 13.4* 12.3* 17.6*  HGB 14.5 14.2 12.9 13.5   HCT 41.9 40.4 35.6* 38.1  MCV 85.9 85.1 83.4 84.7  PLT 360 353 304 387   Basic Metabolic Panel: Recent Labs  Lab 03/26/21 2236 03/27/21 0406 03/28/21 0612 03/29/21 0536  NA 134* 135 134* 138  K 3.8 3.8 3.2* 3.7  CL 100 96* 98 95*  CO2 25 26 28 26   GLUCOSE 132* 110* 119* 96  BUN 26* 17 20 23   CREATININE 0.87 0.74 0.72 0.92  CALCIUM 9.0 8.7* 8.2* 8.7*  MG 2.0 1.6*  --   --   PHOS  --  2.7  --   --      Recent Results (from the past 240 hour(s))  Resp Panel by RT-PCR (Flu A&B, Covid) Nasopharyngeal Swab     Status: Abnormal   Collection Time: 03/26/21 10:36 PM   Specimen: Nasopharyngeal Swab; Nasopharyngeal(NP) swabs in vial transport medium  Result Value Ref Range Status   SARS Coronavirus 2 by RT PCR POSITIVE (A) NEGATIVE Final    Comment: (NOTE) SARS-CoV-2 target nucleic acids are DETECTED.  The SARS-CoV-2 RNA is generally detectable in upper respiratory specimens during the acute phase of infection. Positive results are indicative of the presence of the identified virus, but do not rule out bacterial infection or co-infection with other  pathogens not detected by the test. Clinical correlation with patient history and other diagnostic information is necessary to determine patient infection status. The expected result is Negative.  Fact Sheet for Patients: EntrepreneurPulse.com.au  Fact Sheet for Healthcare Providers: IncredibleEmployment.be  This test is not yet approved or cleared by the [place] FDA and  has been authorized for detection and/or diagnosis of SARS-CoV-2 by FDA under an Emergency Use Authorization (EUA).  This EUA will remain in effect (meaning this test can be used) for the duration of  the COVID-19 declaration under Section 564(b)(1) of the A ct, 21 U.S.C. section 360bbb-3(b)(1), unless the authorization is terminated or revoked sooner.     Influenza A by PCR NEGATIVE NEGATIVE Final   Influenza B by  PCR NEGATIVE NEGATIVE Final    Comment: (NOTE) The Xpert Xpress SARS-CoV-2/FLU/RSV plus assay is intended as an aid in the diagnosis of influenza from Nasopharyngeal swab specimens and should not be used as a sole basis for treatment. Nasal washings and aspirates are unacceptable for Xpert Xpress SARS-CoV-2/FLU/RSV testing.  Fact Sheet for Patients: EntrepreneurPulse.com.au  Fact Sheet for Healthcare Providers: IncredibleEmployment.be  This test is not yet approved or cleared by the [place] FDA and has been authorized for detection and/or diagnosis of SARS-CoV-2 by FDA under an Emergency Use Authorization (EUA). This EUA will remain in effect (meaning this test can be used) for the duration of the COVID-19 declaration under Section 564(b)(1) of the Act, 21 U.S.C. section 360bbb-3(b)(1), unless the authorization is terminated or revoked.  Performed at [facility], [address] [place], [place] [address]   Blood Culture (routine x 2)     Status: None (Preliminary result)   Collection Time: 03/26/21 11:08 PM   Specimen: Left Antecubital; Blood  Result Value Ref Range Status   Specimen Description LEFT ANTECUBITAL  Final   Special Requests   Final    BOTTLES DRAWN AEROBIC AND ANAEROBIC Blood Culture adequate volume   Culture   Final    NO GROWTH 2 DAYS Performed at [facility], [address] [place], [place] [address]    Report Status PENDING  Incomplete  Urine Culture     Status: Abnormal   Collection Time: 03/26/21 11:08 PM   Specimen: In/Out Cath Urine  Result Value Ref Range Status   Specimen Description   Final    IN/OUT CATH URINE Performed at [facility], [address] [place], [place] [address]    Special Requests   Final    NONE Performed at [facility], [address] [place], [place] [address]    Culture 10,000 COLONIES/mL ESCHERICHIA COLI (A)  Final   Report Status  03/29/2021 FINAL  Final   Organism ID, Bacteria ESCHERICHIA COLI (A)  Final      Susceptibility   Escherichia coli - MIC*    AMPICILLIN <=2 SENSITIVE Sensitive     CEFAZOLIN <=4 SENSITIVE Sensitive     CEFEPIME <=0.12 SENSITIVE Sensitive     CEFTRIAXONE <=0.25 SENSITIVE Sensitive     CIPROFLOXACIN <=0.25 SENSITIVE Sensitive     GENTAMICIN <=1 SENSITIVE Sensitive     IMIPENEM <=0.25 SENSITIVE Sensitive     NITROFURANTOIN <=16 SENSITIVE Sensitive     TRIMETH/SULFA <=20 SENSITIVE Sensitive     AMPICILLIN/SULBACTAM <=2 SENSITIVE Sensitive     PIP/TAZO <=4 SENSITIVE Sensitive     * 10,000 COLONIES/mL ESCHERICHIA COLI  Blood Culture (routine x 2)     Status: None (Preliminary result)  Collection Time: 03/26/21 11:09 PM   Specimen: Right Antecubital; Blood  Result Value Ref Range Status   Specimen Description RIGHT ANTECUBITAL  Final   Special Requests   Final    BOTTLES DRAWN AEROBIC AND ANAEROBIC Blood Culture results may not be optimal due to an inadequate volume of blood received in culture bottles   Culture   Final    NO GROWTH 2 DAYS Performed at [facility], [address] [place], [place] [address]    Report Status PENDING  Incomplete     Radiology Studies: No results found.  Scheduled Meds:  chlorhexidine  15 mL Mouth Rinse BID   diltiazem  120 mg Oral Daily   hydrocortisone cream   Topical TID   levothyroxine  88 mcg Oral Q0600   mouth rinse  15 mL Mouth Rinse q12n4p   memantine  5 mg Oral BID   nirmatrelvir/ritonavir EUA  3 tablet Oral BID   [START ON 03/31/2021] predniSONE  10 mg Oral Q breakfast   predniSONE  20 mg Oral Q breakfast   Continuous Infusions:  azithromycin 500 mg (03/29/21 0030)   cefTRIAXone (ROCEPHIN)  IV 2 g (03/28/21 2330)     LOS: 2 days   Shelly Coss, MD [facility] P2/27/2023, 10:40 AM

## 2021-03-29 NOTE — TOC Progression Note (Addendum)
Transition of Care [facility]) - Progression Note    Patient Details  Name: Lisa Martin MRN: [id number] Date of Birth: July 13, 1931  Transition [facility] Care [facility]) CM/SW [place], RN Phone Number: 03/29/2021, 3:13 PM  Clinical Narrative:    Rosalie Gums:  [phone] E re-sent FL2 and expanded bed search.  Addendum 1544:  Patient is COVID + and will not be eligible to transfer to SNF until 06 Apr 2021  Expected Discharge Plan: [place] Barriers to Discharge: Continued Medical Work up  Expected Discharge Plan and Services Expected Discharge Plan: [place] Choice: [place] arrangements for the past 2 months: Single Family Home                                       Social Determinants of Health (SDOH) Interventions    Readmission Risk Interventions No flowsheet data found.

## 2021-03-30 DIAGNOSIS — G9341 Metabolic encephalopathy: Secondary | ICD-10-CM | POA: Diagnosis not present

## 2021-03-30 LAB — CBC WITH DIFFERENTIAL/PLATELET
Abs Immature Granulocytes: 0.07 10*3/uL (ref 0.00–0.07)
Basophils Absolute: 0 10*3/uL (ref 0.0–0.1)
Basophils Relative: 0 %
Eosinophils Absolute: 0.1 10*3/uL (ref 0.0–0.5)
Eosinophils Relative: 0 %
HCT: 35.1 % — ABNORMAL LOW (ref 36.0–46.0)
Hemoglobin: 12.5 g/dL (ref 12.0–15.0)
Immature Granulocytes: 1 %
Lymphocytes Relative: 4 %
Lymphs Abs: 0.5 10*3/uL — ABNORMAL LOW (ref 0.7–4.0)
MCH: 30 pg (ref 26.0–34.0)
MCHC: 35.6 g/dL (ref 30.0–36.0)
MCV: 84.4 fL (ref 80.0–100.0)
Monocytes Absolute: 0.9 10*3/uL (ref 0.1–1.0)
Monocytes Relative: 7 %
Neutro Abs: 11.6 10*3/uL — ABNORMAL HIGH (ref 1.7–7.7)
Neutrophils Relative %: 88 %
Platelets: 329 10*3/uL (ref 150–400)
RBC: 4.16 MIL/uL (ref 3.87–5.11)
RDW: 15.3 % (ref 11.5–15.5)
WBC: 13.1 10*3/uL — ABNORMAL HIGH (ref 4.0–10.5)
nRBC: 0 % (ref 0.0–0.2)

## 2021-03-30 MED ORDER — LISINOPRIL 5 MG PO TABS
2.5000 mg | ORAL_TABLET | Freq: Every day | ORAL | Status: DC
  Administered 2021-03-30 – 2021-04-06 (×8): 2.5 mg via ORAL
  Filled 2021-03-30 (×8): qty 1

## 2021-03-30 MED ORDER — SENNOSIDES-DOCUSATE SODIUM 8.6-50 MG PO TABS
1.0000 | ORAL_TABLET | Freq: Two times a day (BID) | ORAL | Status: DC
  Administered 2021-03-30 – 2021-04-06 (×13): 1 via ORAL
  Filled 2021-03-30 (×13): qty 1

## 2021-03-30 MED ORDER — POLYETHYLENE GLYCOL 3350 17 G PO PACK
17.0000 g | PACK | Freq: Every day | ORAL | Status: DC
  Administered 2021-03-30 – 2021-04-06 (×6): 17 g via ORAL
  Filled 2021-03-30 (×6): qty 1

## 2021-03-30 MED ORDER — PANTOPRAZOLE SODIUM 40 MG PO TBEC
40.0000 mg | DELAYED_RELEASE_TABLET | Freq: Every day | ORAL | Status: DC
  Administered 2021-03-30 – 2021-04-06 (×8): 40 mg via ORAL
  Filled 2021-03-30 (×7): qty 1

## 2021-03-30 NOTE — Progress Notes (Signed)
PROGRESS NOTE  Lisa Martin  ONG:[id number] DOB: [birth year] DOA: 03/26/2021 PCP: Rusty Aus, MD   Brief Narrative:  Patient is a 88 year old female with history of advanced dementia, hypothyroidism, hypertension who was brought to home to the emergency department for the evaluation of altered mental status.  She was also having nonproductive cough, developed maculopapular rash on her chest and abdomen.  Patient recently had a T7 kyphoplasty on 03/01/2021 and has been planned for another T8 kyphoplasty on 04/02/2021.  On presentation she was hemodynamically stable.  COVID screening test came out to be positive.  Chest x-ray showed left lower lobe infiltrate consistent with pneumonia.  CT head did not show any acute intracranial process.  Patient was admitted for the management of community-acquired pneumonia.  Overall doing better.On room air.  PT/OT recommending skilled nursing facility on discharge.  Medically stable for discharge as soon as bed is available at skilled nursing facility.  Prolonged hospitalization due to quarantine status.  As per [facility], she is able to be discharged only on March 7  Assessment & Plan:  Principal Problem:   Acute metabolic encephalopathy Active Problems:   Severe sepsis ([place])   Community acquired pneumonia   Incidental finding of COVID-19 virus infection   Dehydration   Acute prerenal azotemia   Hypertension   GERD (gastroesophageal reflux disease)   Acquired hypothyroidism   Altered mental status: Secondary to acute metabolic encephalopathy from pneumonia/COVID.  Patient has history of advanced dementia.  She is oriented to place only.  Monitor mental status.  Continue supportive care, delirium precautions.  Currently at baseline We resumed home medications.  Sepsis: Suspected sepsis secondary to pneumonia.  Presented with cough, confusion, chest x-ray showing pneumonia.  Lab work showed leukocytosis.  Blood cultures :NGTD.  Urine culture showed 10,000  colonies of pansensitive E. Coli. Her leukocytosis is contributed by steroids  Community-acquired pneumonia: Chest x-ray was suggestive of left lower lobe infiltrate.  No evidence of groundglass opacity, bilateral infiltrates which is typical of viral pneumonia. Started on azithromycin and ceftriaxone.  Currently on room air.  Changed antibiotics to oral.  COVID positive: Currently on room air.  Not a started on steroids.  She was not hypoxic. Given Paxlovid.  Dehydration: Found to be dehydrated on presentation,elevated CK.  Started on IV fluids.iv fluids d/ced  Hypertension:   On diltiazem which has been continued.Added lisinopril  Hypothyroidism:Continue Synthyroid  Hypokalemia: Supplemented and corrected  History of generalized anxiety disorder: On Zoloft, Xanax. Resumed  GERD: Continue PPI  Rash: Patient presented with erythematous maculopapular rash scattered on the chest, abdomen.  Continue hydrocortisone cream as needed , prednisone now being tapered.  Etiology not clear. Resolved  Dementia: Patient is disoriented at baseline.  Takes memantine , rivastigmine at home.  This have been continued.  Delirium precautions  Debility/deconditioning/compression fractures: PT/OT recommending skilled nursing facility on discharge.Uses cane for walking,lives with daughter. Patient has compression fracture.  She underwent T7 kyphoplasty on 03/01/2021.  T8 kyphoplasty has been planned on 04/02/2021         DVT prophylaxis:enoxaparin (LOVENOX) injection 30 mg Start: 03/29/21 2200 SCDs Start: 03/27/21 0036     Code Status: Full Code  Family Communication:: Discussed with daughter on 2/28 on phone  Patient status:inpatient  Patient is from :Home  Anticipated discharge to::SNF  Estimated DC date: Medically stable for discharge whenever possible to place in the skilled nursing facility.Needs to finish quarantine   Consultants: None  Procedures:None  Antimicrobials:   Anti-infectives (From admission, onward)  Start     Dose/Rate Route Frequency Ordered Stop   03/30/21 1000  azithromycin (ZITHROMAX) tablet 500 mg        500 mg Oral Daily 03/29/21 1043 04/01/21 0959   03/30/21 1000  cefdinir (OMNICEF) capsule 300 mg        300 mg Oral Daily 03/29/21 1043 04/01/21 0959   03/28/21 2200  nirmatrelvir/ritonavir EUA (PAXLOVID) 3 tablet        3 tablet Oral 2 times daily 03/28/21 1501 04/01/21 0959   03/27/21 0115  nirmatrelvir/ritonavir EUA (renal dosing) (PAXLOVID) 2 tablet  Status:  Discontinued        2 tablet Oral 2 times daily 03/27/21 0106 03/28/21 1501   03/26/21 2300  cefTRIAXone (ROCEPHIN) 2 g in sodium chloride 0.9 % 100 mL IVPB  Status:  Discontinued        2 g 200 mL/hr over 30 Minutes Intravenous Every 24 hours 03/26/21 2258 03/29/21 1043   03/26/21 2300  azithromycin (ZITHROMAX) 500 mg in sodium chloride 0.9 % 250 mL IVPB  Status:  Discontinued        500 mg 250 mL/hr over 60 Minutes Intravenous Every 24 hours 03/26/21 2258 03/29/21 1043      Subjective:  Patient seen and examined at the bedside this morning.  Hemodynamically stable.  On room air.  Not in any kind of respite distress.  Complains of some abdominal discomfort today.  Confused.  Objective: Vitals:   03/29/21 1626 03/29/21 1934 03/30/21 0545 03/30/21 0740  BP: (!) 151/74 (!) 168/87 (!) 163/97 (!) 161/65  Pulse: 71 (!) 54 90 (!) 55  Resp: 20 18 18 15   Temp: 98.3 F (36.8 C) 98 F (36.7 C) (!) 97.5 F (36.4 C) 97.7 F (36.5 C)  TempSrc: Oral Oral Oral Oral  SpO2: 99% (!) 85% 99% 100%  Weight:      Height:        Intake/Output Summary (Last 24 hours) at 03/30/2021 0741 Last data filed at 03/29/2021 2020 Gross per 24 hour  Intake --  Output 1 ml  Net -1 ml    Filed Weights   03/26/21 2137  Weight: 39.5 kg    Examination: General exam: Very deconditioned, chronically ill looking, frail HEENT: PERRL Respiratory system:  no wheezes or crackles   Cardiovascular system: S1 & S2 heard, RRR.  Gastrointestinal system: Abdomen is nondistended, soft and nontender. Central nervous system: Alert and awake but not oriented Extremities: No edema, no clubbing ,no cyanosis Skin: No rashes, no ulcers,no icterus    Data Reviewed: I have personally reviewed following labs and imaging studies  CBC: Recent Labs  Lab 03/26/21 2236 03/27/21 0406 03/28/21 0612 03/29/21 0536 03/30/21 0550  WBC 17.4* 15.2* 13.3* 19.2* 13.1*  NEUTROABS 15.7* 13.4* 12.3* 17.6* 11.6*  HGB 14.5 14.2 12.9 13.5 12.5  HCT 41.9 40.4 35.6* 38.1 35.1*  MCV 85.9 85.1 83.4 84.7 84.4  PLT 360 353 304 395 102   Basic Metabolic Panel: Recent Labs  Lab 03/26/21 2236 03/27/21 0406 03/28/21 0612 03/29/21 0536  NA 134* 135 134* 138  K 3.8 3.8 3.2* 3.7  CL 100 96* 98 95*  CO2 25 26 28 26   GLUCOSE 132* 110* 119* 96  BUN 26* 17 20 23   CREATININE 0.87 0.74 0.72 0.92  CALCIUM 9.0 8.7* 8.2* 8.7*  MG 2.0 1.6*  --   --   PHOS  --  2.7  --   --      Recent Results (from the  past 240 hour(s))  Resp Panel by RT-PCR (Flu A&B, Covid) Nasopharyngeal Swab     Status: Abnormal   Collection Time: 03/26/21 10:36 PM   Specimen: Nasopharyngeal Swab; Nasopharyngeal(NP) swabs in vial transport medium  Result Value Ref Range Status   SARS Coronavirus 2 by RT PCR POSITIVE (A) NEGATIVE Final    Comment: (NOTE) SARS-CoV-2 target nucleic acids are DETECTED.  The SARS-CoV-2 RNA is generally detectable in upper respiratory specimens during the acute phase of infection. Positive results are indicative of the presence of the identified virus, but do not rule out bacterial infection or co-infection with other pathogens not detected by the test. Clinical correlation with patient history and other diagnostic information is necessary to determine patient infection status. The expected result is Negative.  Fact Sheet for Patients: EntrepreneurPulse.com.au  Fact Sheet  for Healthcare Providers: IncredibleEmployment.be  This test is not yet approved or cleared by the [place] FDA and  has been authorized for detection and/or diagnosis of SARS-CoV-2 by FDA under an Emergency Use Authorization (EUA).  This EUA will remain in effect (meaning this test can be used) for the duration of  the COVID-19 declaration under Section 564(b)(1) of the A ct, 21 U.S.C. section 360bbb-3(b)(1), unless the authorization is terminated or revoked sooner.     Influenza A by PCR NEGATIVE NEGATIVE Final   Influenza B by PCR NEGATIVE NEGATIVE Final    Comment: (NOTE) The Xpert Xpress SARS-CoV-2/FLU/RSV plus assay is intended as an aid in the diagnosis of influenza from Nasopharyngeal swab specimens and should not be used as a sole basis for treatment. Nasal washings and aspirates are unacceptable for Xpert Xpress SARS-CoV-2/FLU/RSV testing.  Fact Sheet for Patients: EntrepreneurPulse.com.au  Fact Sheet for Healthcare Providers: IncredibleEmployment.be  This test is not yet approved or cleared by the [place] FDA and has been authorized for detection and/or diagnosis of SARS-CoV-2 by FDA under an Emergency Use Authorization (EUA). This EUA will remain in effect (meaning this test can be used) for the duration of the COVID-19 declaration under Section 564(b)(1) of the Act, 21 U.S.C. section 360bbb-3(b)(1), unless the authorization is terminated or revoked.  Performed at [facility], [address] [place], [place] [address]   Blood Culture (routine x 2)     Status: None (Preliminary result)   Collection Time: 03/26/21 11:08 PM   Specimen: Left Antecubital; Blood  Result Value Ref Range Status   Specimen Description LEFT ANTECUBITAL  Final   Special Requests   Final    BOTTLES DRAWN AEROBIC AND ANAEROBIC Blood Culture adequate volume   Culture   Final    NO GROWTH 2 DAYS Performed at  [facility], [address], [place], [place] [address]    Report Status PENDING  Incomplete  Urine Culture     Status: Abnormal   Collection Time: 03/26/21 11:08 PM   Specimen: In/Out Cath Urine  Result Value Ref Range Status   Specimen Description   Final    IN/OUT CATH URINE Performed at [facility], [address], [place], [place] [address]    Special Requests   Final    NONE Performed at [facility], [address] [place],  [address]    Culture 10,000 COLONIES/mL ESCHERICHIA COLI (A)  Final   Report Status 03/29/2021 FINAL  Final   Organism ID, Bacteria ESCHERICHIA COLI (A)  Final      Susceptibility   Escherichia coli - MIC*    AMPICILLIN <=2 SENSITIVE Sensitive  CEFAZOLIN <=4 SENSITIVE Sensitive     CEFEPIME <=0.12 SENSITIVE Sensitive     CEFTRIAXONE <=0.25 SENSITIVE Sensitive     CIPROFLOXACIN <=0.25 SENSITIVE Sensitive     GENTAMICIN <=1 SENSITIVE Sensitive     IMIPENEM <=0.25 SENSITIVE Sensitive     NITROFURANTOIN <=16 SENSITIVE Sensitive     TRIMETH/SULFA <=20 SENSITIVE Sensitive     AMPICILLIN/SULBACTAM <=2 SENSITIVE Sensitive     PIP/TAZO <=4 SENSITIVE Sensitive     * 10,000 COLONIES/mL ESCHERICHIA COLI  Blood Culture (routine x 2)     Status: None (Preliminary result)   Collection Time: 03/26/21 11:09 PM   Specimen: Right Antecubital; Blood  Result Value Ref Range Status   Specimen Description RIGHT ANTECUBITAL  Final   Special Requests   Final    BOTTLES DRAWN AEROBIC AND ANAEROBIC Blood Culture results may not be optimal due to an inadequate volume of blood received in culture bottles   Culture   Final    NO GROWTH 2 DAYS Performed at [facility], [address], [place], [place] [address]    Report Status PENDING  Incomplete     Radiology Studies: No results found.  Scheduled Meds:  azithromycin  500 mg Oral Daily   cefdinir  300 mg Oral Daily   chlorhexidine  15 mL Mouth Rinse BID    diltiazem  120 mg Oral Daily   enoxaparin (LOVENOX) injection  30 mg Subcutaneous Q24H   gabapentin  100 mg Oral TID   levothyroxine  88 mcg Oral Q0600   lisinopril  2.5 mg Oral Daily   mouth rinse  15 mL Mouth Rinse q12n4p   memantine  5 mg Oral BID   nirmatrelvir/ritonavir EUA  3 tablet Oral BID   [START ON 03/31/2021] predniSONE  10 mg Oral Q breakfast   predniSONE  20 mg Oral Q breakfast   rivastigmine  3 mg Oral BID   sertraline  25 mg Oral Daily   Continuous Infusions:     LOS: 3 days   Shelly Coss, MD [facility] P2/28/2023, 7:41 AM

## 2021-03-30 NOTE — Progress Notes (Signed)
Physical Therapy Treatment Patient Details Name: Lisa Martin MRN: [id number] DOB: 1931-07-09 Today's Date: 03/30/2021   History of Present Illness Pt is an 88 y/o F w/ PMH: advanced dementia, hypothyroidism, hypertension, recent kpyohplasty at T7 on 1/30, compression fx to T8 on XR on 2/14 w/ plan for upcoming kyphoplasty who presented to ED via EMS d/t AMS and was found to be COVID +. Adm for sepsis r/t PNA.    PT Comments    Patient received in recliner. Reports she is cold. Sitter present in room. Patient less restless than on prior visit. Able to follow direction well for mobility. She requires min A for sit to stand. Min A for ambulation of 40 feet with RW. She required most of assist for steering RW and avoiding obstacles in room. Patient will continue to benefit from skilled PT while here to improve mobility and safety.       Recommendations for follow up therapy are one component of a multi-disciplinary discharge planning process, led by the attending physician.  Recommendations may be updated based on patient status, additional functional criteria and insurance authorization.  Follow Up Recommendations  Skilled nursing-short term rehab (<3 hours/day)     Assistance Recommended at Discharge Frequent or constant Supervision/Assistance  Patient can return home with the following A little help with walking and/or transfers;A little help with bathing/dressing/bathroom;Help with stairs or ramp for entrance;Direct supervision/assist for medications management   Equipment Recommendations  Rolling walker (2 wheels)    Recommendations for Other Services       Precautions / Restrictions Precautions Precautions: Fall Restrictions Weight Bearing Restrictions: No Other Position/Activity Restrictions: no precautions listed in Dr. Corliss Blacker (with radiology) who performed recent T7 kypho and had recommened T8 kypho.     Mobility  Bed Mobility               General bed mobility  comments: patient received up in recliner    Transfers Overall transfer level: Needs assistance Equipment used: Rolling walker (2 wheels) Transfers: Sit to/from Stand Sit to Stand: Min assist           General transfer comment: min assist to achieve standing balance from recliner to standing.    Ambulation/Gait Ambulation/Gait assistance: Min assist Gait Distance (Feet): 40 Feet Assistive device: Rolling walker (2 wheels) Gait Pattern/deviations: Step-through pattern, Decreased step length - right, Decreased step length - left, Trunk flexed, Shuffle Gait velocity: decr     General Gait Details: Patient able to ambulate in room with RW and min A. She veers to her left with RW during ambulation and requires assist to steer walker and avoid obstacles   Stairs             Wheelchair Mobility    Modified Rankin (Stroke Patients Only)       Balance Overall balance assessment: Needs assistance Sitting-balance support: Feet supported Sitting balance-Leahy Scale: Good     Standing balance support: Bilateral upper extremity supported, During functional activity, Reliant on assistive device for balance Standing balance-Leahy Scale: Fair                              Cognition Arousal/Alertness: Awake/alert Behavior During Therapy: WFL for tasks assessed/performed Overall Cognitive Status: No family/caregiver present to determine baseline cognitive functioning Area of Impairment: Orientation, Problem solving, Safety/judgement, Awareness                 Orientation Level: Disoriented to, Time,  Situation   Memory: Decreased short-term memory Following Commands: Follows one step commands inconsistently, Follows one step commands with increased time     Problem Solving: Requires verbal cues, Requires tactile cues General Comments: patient with improved cognition since evaluation 2 days ago.        Exercises      General Comments         Pertinent Vitals/Pain Pain Assessment Pain Assessment: No/denies pain Breathing: normal Negative Vocalization: none Facial Expression: smiling or inexpressive Body Language: relaxed Consolability: no need to console PAINAD Score: 0    Home Living                          Prior Function            PT Goals (current goals can now be found in the care plan section) Acute Rehab PT Goals Patient Stated Goal: none stated PT Goal Formulation: Patient unable to participate in goal setting Time For Goal Achievement: 04/12/21 Progress towards PT goals: Progressing toward goals    Frequency    Min 2X/week      PT Plan Current plan remains appropriate    Co-evaluation              AM-PAC PT "6 Clicks" Mobility   Outcome Measure  Help needed turning from your back to your side while in a flat bed without using bedrails?: A Little   Help needed moving to and from a bed to a chair (including a wheelchair)?: A Little Help needed standing up from a chair using your arms (e.g., wheelchair or bedside chair)?: A Little Help needed to walk in hospital room?: A Little Help needed climbing 3-5 steps with a railing? : A Lot 6 Click Score: 14    End of Session Equipment Utilized During Treatment: Gait belt Activity Tolerance: Patient tolerated treatment well Patient left: in chair;with nursing/sitter in room Nurse Communication: Mobility status PT Visit Diagnosis: Unsteadiness on feet (R26.81);Other abnormalities of gait and mobility (R26.89);Muscle weakness (generalized) (M62.81);Difficulty in walking, not elsewhere classified (R26.2)     Time: 1000-1015 PT Time Calculation (min) (ACUTE ONLY): 15 min  Charges:  $Gait Training: 8-22 mins                     [employer], PT, GCS 03/30/21,11:07 AM

## 2021-03-30 NOTE — TOC Progression Note (Signed)
Transition of Care [facility]) - Progression Note    Patient Details  Name: Lisa Martin MRN: [id number] Date of Birth: 1931/06/01  Transition of Care [facility]) CM/SW [place], RN Phone Number: 03/30/2021, 3:37 PM  Clinical Narrative:   Spoke with patient's daughter.  She states she does not wish for patient to go to [facility] at this time.  She would like patient to go to [facility].  RNCM explained that we could begin the process and facilitate but we cannot guarantee that patient can go directly to memory care from hospital.    Daughter states she is the only caregiver and works full time from home.  She states that caring for her mother has been very challenging as of late and she would like patient to be transferred to a facility.   When asked what daughter would like for patient, she states that she wants memory care and understands she may have to take patient home.    Expected Discharge Plan: [place] Barriers to Discharge: Continued Medical Work up  Expected Discharge Plan and Services Expected Discharge Plan: [place] Choice: [place] arrangements for the past 2 months: Single Family Home                                       Social Determinants of Health (SDOH) Interventions    Readmission Risk Interventions No flowsheet data found.

## 2021-03-30 NOTE — TOC Progression Note (Signed)
Transition of Care [facility]) - Progression Note    Patient Details  Name: Lisa Martin MRN: [id number] Date of Birth: 03/10/1931  Transition of Care [facility]) CM/SW [place], RN Phone Number: 03/30/2021, 11:18 AM  Clinical Narrative:   Message left for patient's daughter to discuss disposition.  TOC to follow.    Expected Discharge Plan: [place] Barriers to Discharge: Continued Medical Work up  Expected Discharge Plan and Services Expected Discharge Plan: [place] Choice: [place] arrangements for the past 2 months: Single Family Home                                       Social Determinants of Health (SDOH) Interventions    Readmission Risk Interventions No flowsheet data found.

## 2021-03-30 NOTE — Progress Notes (Signed)
Occupational Therapy Treatment Patient Details Name: Lisa Martin MRN: [id number] DOB: [birth year] Today's Date: 03/30/2021   History of present illness Pt is an 88 y/o F w/ PMH: advanced dementia, hypothyroidism, hypertension, recent kpyohplasty at T7 on 1/30, compression fx to T8 on XR on 2/14 w/ plan for upcoming kyphoplasty who presented to ED via EMS d/t AMS and was found to be COVID +. Adm for sepsis r/t PNA.   OT comments  Lisa Martin was seen for OT treatment on this date. Upon arrival to room pt in fetal position in bed, reports being cold but agreeable to tx. Pt requires MOD A don/doff brief in standing. MIN A + RW for bed>chair t/f - assist for safe RW mgmt as pt pushes RW with arms fully extended. SETUP self-feeding seated in chair. Pt making good progress toward goals. Pt continues to benefit from skilled OT services to maximize return to PLOF and minimize risk of future falls, injury, caregiver burden, and readmission. Will continue to follow POC. Discharge recommendation remains appropriate  however if pt has 24/7 caregiver assistance at home pt may be safe to return home.   Recommendations for follow up therapy are one component of a multi-disciplinary discharge planning process, led by the attending physician.  Recommendations may be updated based on patient status, additional functional criteria and insurance authorization.    Follow Up Recommendations  Skilled nursing-short term rehab (<3 hours/day)    Assistance Recommended at Discharge Frequent or constant Supervision/Assistance  Patient can return home with the following  A lot of help with bathing/dressing/bathroom;Assistance with cooking/housework;Direct supervision/assist for medications management;Assist for transportation;Direct supervision/assist for financial management;Help with stairs or ramp for entrance;A little help with walking and/or transfers   Equipment Recommendations  Other (comment);BSC/3in1;Tub/shower seat  (2WW)    Recommendations for Other Services      Precautions / Restrictions Precautions Precautions: Fall Restrictions Weight Bearing Restrictions: No       Mobility Bed Mobility Overal bed mobility: Needs Assistance Bed Mobility: Supine to Sit     Supine to sit: Min assist     General bed mobility comments: assist to initiate    Transfers Overall transfer level: Needs assistance Equipment used: Rolling walker (2 wheels) Transfers: Sit to/from Stand Sit to Stand: Min assist                 Balance Overall balance assessment: Needs assistance Sitting-balance support: Feet supported Sitting balance-Leahy Scale: Good     Standing balance support: Bilateral upper extremity supported Standing balance-Leahy Scale: Fair                             ADL either performed or assessed with clinical judgement   ADL Overall ADL's : Needs assistance/impaired                                       General ADL Comments: MOD A don/doff brief in standing. MIN A + RW for ADL t/f - assist for safe RW mgmt. SETUP self-feeding seated in chair      Cognition Arousal/Alertness: Awake/alert Behavior During Therapy: WFL for tasks assessed/performed Overall Cognitive Status: No family/caregiver present to determine baseline cognitive functioning  Pertinent Vitals/ Pain       Pain Assessment Pain Assessment: No/denies pain         Frequency  Min 2X/week        Progress Toward Goals  OT Goals(current goals can now be found in the care plan section)  Progress towards OT goals: Progressing toward goals  Acute Rehab OT Goals Patient Stated Goal: to be warm OT Goal Formulation: With patient Time For Goal Achievement: 04/10/21 Potential to Achieve Goals: Fair ADL Goals Pt Will Perform Upper Body Dressing: with set-up Pt Will Perform Lower Body Dressing: with min  guard assist Pt Will Transfer to Toilet: with min guard assist;ambulating;bedside commode  Plan Discharge plan remains appropriate;Frequency remains appropriate    Co-evaluation                 AM-PAC OT "6 Clicks" Daily Activity     Outcome Measure   Help from another person eating meals?: None Help from another person taking care of personal grooming?: A Little Help from another person toileting, which includes using toliet, bedpan, or urinal?: A Lot Help from another person bathing (including washing, rinsing, drying)?: A Lot Help from another person to put on and taking off regular upper body clothing?: A Little Help from another person to put on and taking off regular lower body clothing?: A Lot 6 Click Score: 16    End of Session Equipment Utilized During Treatment: Rolling walker (2 wheels)  OT Visit Diagnosis: Unsteadiness on feet (R26.81);Muscle weakness (generalized) (M62.81)   Activity Tolerance Patient tolerated treatment well   Patient Left in chair;with call bell/phone within reach;with nursing/sitter in room   Nurse Communication          Time: 7425-9563 OT Time Calculation (min): 15 min  Charges: OT General Charges $OT Visit: 1 Visit OT Treatments $Self Care/Home Management : 8-22 mins  Lisa Martin, M.S. OTR/L  03/30/21, 10:25 AM  ascom [phone]

## 2021-03-31 DIAGNOSIS — G9341 Metabolic encephalopathy: Secondary | ICD-10-CM | POA: Diagnosis not present

## 2021-03-31 LAB — BASIC METABOLIC PANEL
Anion gap: 10 (ref 5–15)
BUN: 30 mg/dL — ABNORMAL HIGH (ref 8–23)
CO2: 26 mmol/L (ref 22–32)
Calcium: 8.5 mg/dL — ABNORMAL LOW (ref 8.9–10.3)
Chloride: 100 mmol/L (ref 98–111)
Creatinine, Ser: 0.88 mg/dL (ref 0.44–1.00)
GFR, Estimated: >60 mL/min (ref >60)
Glucose, Bld: 114 mg/dL — ABNORMAL HIGH (ref 70–99)
Potassium: 3.5 mmol/L (ref 3.5–5.1)
Sodium: 136 mmol/L (ref 135–145)

## 2021-03-31 LAB — BLOOD GAS, VENOUS
Acid-Base Excess: 2.8 mmol/L — ABNORMAL HIGH (ref 0.0–2.0)
Bicarbonate: 28.5 mmol/L — ABNORMAL HIGH (ref 20.0–28.0)
Patient temperature: 37
pCO2, Ven: 47 mmHg (ref 44–60)
pH, Ven: 7.39 (ref 7.25–7.43)

## 2021-03-31 LAB — CBC WITH DIFFERENTIAL/PLATELET
Abs Immature Granulocytes: 0.07 10*3/uL (ref 0.00–0.07)
Basophils Absolute: 0 10*3/uL (ref 0.0–0.1)
Basophils Relative: 0 %
Eosinophils Absolute: 0 10*3/uL (ref 0.0–0.5)
Eosinophils Relative: 0 %
HCT: 33.3 % — ABNORMAL LOW (ref 36.0–46.0)
Hemoglobin: 11.9 g/dL — ABNORMAL LOW (ref 12.0–15.0)
Immature Granulocytes: 1 %
Lymphocytes Relative: 4 %
Lymphs Abs: 0.4 10*3/uL — ABNORMAL LOW (ref 0.7–4.0)
MCH: 30.2 pg (ref 26.0–34.0)
MCHC: 35.7 g/dL (ref 30.0–36.0)
MCV: 84.5 fL (ref 80.0–100.0)
Monocytes Absolute: 0.6 10*3/uL (ref 0.1–1.0)
Monocytes Relative: 6 %
Neutro Abs: 10.3 10*3/uL — ABNORMAL HIGH (ref 1.7–7.7)
Neutrophils Relative %: 89 %
Platelets: 325 10*3/uL (ref 150–400)
RBC: 3.94 MIL/uL (ref 3.87–5.11)
RDW: 15.3 % (ref 11.5–15.5)
WBC: 11.5 10*3/uL — ABNORMAL HIGH (ref 4.0–10.5)
nRBC: 0 % (ref 0.0–0.2)

## 2021-03-31 NOTE — TOC Progression Note (Signed)
Transition of Care (TOC) - Progression Note  ? ? ?Patient Details  ?Name: Lisa Martin ?MRN: [id number] ?Date of Birth: [birth year] ? ?Transition of Care (TOC) CM/SW Contact  ?Pete Pelt, RN ?Phone Number: ?03/31/2021, 10:17 AM ? ?Clinical Narrative:   Left message for daughter.  Discussed memory care with Colletta Maryland at [facility], who currently has an open bed.  Daughter stated she was interested in memory care yesterday.  Colletta Maryland will contact daughter.  TOC to follow. ? ? ? ?Expected Discharge Plan: [place] ?Barriers to Discharge: Continued Medical Work up ? ?Expected Discharge Plan and Services ?Expected Discharge Plan: [place] ?  ?  ?Post Acute Care Choice: [place] ?Living arrangements for the past 2 months: [place] ?                ?  ?  ?  ?  ?  ?  ?  ?  ?  ?  ? ? ?Social Determinants of Health (SDOH) Interventions ?  ? ?Readmission Risk Interventions ?No flowsheet data found. ? ?

## 2021-03-31 NOTE — Progress Notes (Signed)
Physical Therapy Treatment ?Patient Details ?Name: Lisa Martin ?MRN: [id number] ?DOB: 23-Jun-1931 ?Today's Date: 03/31/2021 ? ? ?History of Present Illness Pt is an 88 y/o F w/ PMH: advanced dementia, hypothyroidism, hypertension, recent kpyohplasty at T7 on 1/30, compression fx to T8 on XR on 2/14 w/ plan for upcoming kyphoplasty who presented to ED via EMS d/t AMS and was found to be COVID +. Adm for sepsis r/t PNA. ? ?  ?PT Comments  ? ? Pt was sitting in recliner with sitter at bedside. She agrees to session and is cooperative however disoriented/confused. Was able to follow simple 1 step commands with increased time. She stood and ambulated with RW ~ 59 ft without LOB or safety concern. Does endorse LBP throughout session however it did not limit session progression. Highly recommend DC to SNF to improve independence with ADLs.  ?  ?Recommendations for follow up therapy are one component of a multi-disciplinary discharge planning process, led by the attending physician.  Recommendations may be updated based on patient status, additional functional criteria and insurance authorization. ? ?Follow Up Recommendations ? Skilled nursing-short term rehab (<3 hours/day) ?  ?  ?Assistance Recommended at Discharge Frequent or constant Supervision/Assistance  ?Patient can return home with the following A little help with walking and/or transfers;A little help with bathing/dressing/bathroom;Help with stairs or ramp for entrance;Direct supervision/assist for medications management ?  ?Equipment Recommendations ? Rolling walker (2 wheels)  ?  ?   ?Precautions / Restrictions Precautions ?Precautions: Fall ?Restrictions ?Weight Bearing Restrictions: No  ?  ? ?Mobility ? Bed Mobility ?   ?General bed mobility comments: in recliner pre/post session ?  ? ?Transfers ?Overall transfer level: Needs assistance ?Equipment used: Rolling walker (2 wheels) ?Transfers: Sit to/from Stand ?Sit to Stand: Min guard ?   ?General transfer comment:  CGA for safety. Vcs for handplacement and fwd wt shift ?  ? ?Ambulation/Gait ?Ambulation/Gait assistance: Min guard ?Gait Distance (Feet): 75 Feet ?Assistive device: Rolling walker (2 wheels) ?Gait Pattern/deviations: Step-through pattern, Decreased step length - right, Decreased step length - left, Trunk flexed, Shuffle ?Gait velocity: decreased ?  ?  ?General Gait Details: Pt was able to ambulate 13ft with RW without LOB. CGA for safety. ? ? ?  ?Balance Overall balance assessment: Needs assistance ?Sitting-balance support: Feet supported ?Sitting balance-Leahy Scale: Good ?  ?  ?Standing balance support: Bilateral upper extremity supported, During functional activity, Reliant on assistive device for balance ?Standing balance-Leahy Scale: Fair ?  ?  ?  ?  ?Cognition Arousal/Alertness: Awake/alert ?Behavior During Therapy: [facility] for tasks assessed/performed ?Overall Cognitive Status: Impaired/Different from baseline ?  ?  ?   ?General Comments: Pt has baseline cognition deficits. cognition greatly impacts session but overall pt was cooperativeand able to follow simple commands throughout ?  ?  ? ?  ?   ?   ? ?Pertinent Vitals/Pain Pain Assessment ?Breathing: normal ?Negative Vocalization: none ?Facial Expression: smiling or inexpressive ?Body Language: relaxed ?Consolability: no need to console ?PAINAD Score: 0  ? ? ? ?PT Goals (current goals can now be found in the care plan section) Acute Rehab PT Goals ?Patient Stated Goal: none stated ?Progress towards PT goals: Progressing toward goals ? ?  ?Frequency ? ? ? Min 2X/week ? ? ? ?  ?PT Plan Current plan remains appropriate  ? ? ?   ?AM-PAC PT "6 Clicks" Mobility   ?Outcome Measure ? Help needed turning from your back to your side while in a flat bed without using bedrails?:  A Little ?Help needed moving from lying on your back to sitting on the side of a flat bed without using bedrails?: A Little ?Help needed moving to and from a bed to a chair (including a  wheelchair)?: A Little ?Help needed standing up from a chair using your arms (e.g., wheelchair or bedside chair)?: A Little ?Help needed to walk in hospital room?: A Little ?Help needed climbing 3-5 steps with a railing? : A Little ?6 Click Score: 18 ? ?  ?End of Session Equipment Utilized During Treatment: Gait belt ?Activity Tolerance: Patient tolerated treatment well ?Patient left: in chair;with call bell/phone within reach ?Nurse Communication: Mobility status ?PT Visit Diagnosis: Unsteadiness on feet (R26.81);Other abnormalities of gait and mobility (R26.89);Muscle weakness (generalized) (M62.81);Difficulty in walking, not elsewhere classified (R26.2) ?  ? ? ?Time: 8177-1165 ?PT Time Calculation (min) (ACUTE ONLY): 15 min ? ?Charges:  $Gait Training: 8-22 mins          ?          ? ?Julaine Fusi PTA ?03/31/21, 5:10 PM  ? ?

## 2021-03-31 NOTE — Progress Notes (Signed)
PROGRESS NOTE  Lisa Martin  OZH:[id number] DOB: Aug 13, 1931 DOA: 03/26/2021 PCP: Rusty Aus, MD   Brief Narrative:  Patient is a 88 year old female with history of advanced dementia, hypothyroidism, hypertension who was brought to home to the emergency department for the evaluation of altered mental status.  She was also having nonproductive cough, developed maculopapular rash on her chest and abdomen.  Patient recently had a T7 kyphoplasty on 03/01/2021 and has been planned for another T8 kyphoplasty on 04/02/2021.  On presentation she was hemodynamically stable.  COVID screening test came out to be positive.  Chest x-ray showed left lower lobe infiltrate consistent with pneumonia.  CT head did not show any acute intracranial process.  Patient was admitted for the management of community-acquired pneumonia.  Overall doing better.On room air.  PT/OT recommending skilled nursing facility on discharge.  Medically stable for discharge as soon as bed is available at skilled nursing facility.  Prolonged hospitalization due to quarantine status.  As per [facility], she is able to be discharged only on March 7  Assessment & Plan:  Principal Problem:   Acute metabolic encephalopathy Active Problems:   Severe sepsis ([place])   Community acquired pneumonia   Incidental finding of COVID-19 virus infection   Dehydration   Acute prerenal azotemia   Hypertension   GERD (gastroesophageal reflux disease)   Acquired hypothyroidism   Altered mental status: Secondary to acute metabolic encephalopathy from pneumonia/COVID.  Patient has history of advanced dementia.  She is oriented to place only.  Monitor mental status.  Continue supportive care, delirium precautions.  Currently at baseline We resumed home medications.  Sepsis: Suspected sepsis secondary to pneumonia.  Presented with cough, confusion, chest x-ray showing pneumonia.  Lab work showed leukocytosis.  Blood cultures :NGTD.  Urine culture showed 10,000  colonies of pansensitive E. Coli. Her leukocytosis is contributed by steroids  Community-acquired pneumonia: Chest x-ray was suggestive of left lower lobe infiltrate.  No evidence of groundglass opacity, bilateral infiltrates which is typical of viral pneumonia. Started on azithromycin and ceftriaxone.  Currently on room air.  Changed antibiotics to oral,finished course.  COVID positive: Currently on room air.  Not a started on steroids.  She was not hypoxic. Given Paxlovid.  Dehydration: Found to be dehydrated on presentation,elevated CK.  Started on IV fluids.iv fluids d/ced  Hypertension:   On diltiazem which has been continued.Added lisinopril  Hypothyroidism:Continue Synthyroid  Hypokalemia: Supplemented and corrected  History of generalized anxiety disorder: On Zoloft, Xanax. Resumed  GERD: Continue PPI  Rash: Patient presented with erythematous maculopapular rash scattered on the chest, abdomen.  Continue hydrocortisone cream as needed , prednisone now being tapered.  Etiology not clear. Resolved  Dementia: Patient is disoriented at baseline.  Takes memantine , rivastigmine at home.  This have been continued.  Delirium precautions  Debility/deconditioning/compression fractures: PT/OT recommending skilled nursing facility on discharge.Uses cane for walking,lives with daughter. Patient has compression fracture.  She underwent T7 kyphoplasty on 03/01/2021.  T8 kyphoplasty has been planned on 04/02/2021         DVT prophylaxis:enoxaparin (LOVENOX) injection 30 mg Start: 03/29/21 2200 SCDs Start: 03/27/21 0036     Code Status: Full Code  Family Communication:: Discussed with daughter on 2/28 on phone  Patient status:inpatient  Patient is from :Home  Anticipated discharge to::SNF  Estimated DC date: Medically stable for discharge whenever possible to place in the skilled nursing facility.Needs to finish quarantine   Consultants:  None  Procedures:None  Antimicrobials:  Anti-infectives (From admission,  onward)    Start     Dose/Rate Route Frequency Ordered Stop   03/30/21 1000  azithromycin (ZITHROMAX) tablet 500 mg        500 mg Oral Daily 03/29/21 1043 04/01/21 0959   03/30/21 1000  cefdinir (OMNICEF) capsule 300 mg        300 mg Oral Daily 03/29/21 1043 04/01/21 0959   03/28/21 2200  nirmatrelvir/ritonavir EUA (PAXLOVID) 3 tablet        3 tablet Oral 2 times daily 03/28/21 1501 04/01/21 0959   03/27/21 0115  nirmatrelvir/ritonavir EUA (renal dosing) (PAXLOVID) 2 tablet  Status:  Discontinued        2 tablet Oral 2 times daily 03/27/21 0106 03/28/21 1501   03/26/21 2300  cefTRIAXone (ROCEPHIN) 2 g in sodium chloride 0.9 % 100 mL IVPB  Status:  Discontinued        2 g 200 mL/hr over 30 Minutes Intravenous Every 24 hours 03/26/21 2258 03/29/21 1043   03/26/21 2300  azithromycin (ZITHROMAX) 500 mg in sodium chloride 0.9 % 250 mL IVPB  Status:  Discontinued        500 mg 250 mL/hr over 60 Minutes Intravenous Every 24 hours 03/26/21 2258 03/29/21 1043      Subjective:  Patient seen and examined at the bedside this morning.  Not in any kind of distress.  On room air.  Same as yesterday.  Alert and awake but confused.  Complains of some back pain today  Objective: Vitals:   03/30/21 1554 03/30/21 1950 03/31/21 0515 03/31/21 0539  BP: 107/61 109/65  139/70  Pulse: 73 78  62  Resp: 17 17  15   Temp: 98.6 F (37 C) 98.2 F (36.8 C)  98 F (36.7 C)  TempSrc:  Oral  Oral  SpO2: 98% 98%  97%  Weight:   35.7 kg   Height:       No intake or output data in the 24 hours ending 03/31/21 0728   Filed Weights   03/26/21 2137 03/31/21 0515  Weight: 39.5 kg 35.7 kg    Examination: General exam: Overall comfortable, not in distress, very deconditioned HEENT: PERRL Respiratory system:  no wheezes or crackles  Cardiovascular system: S1 & S2 heard, RRR.  Gastrointestinal system: Abdomen is nondistended, soft  and nontender. Central nervous system: Alert and awake, not oriented Extremities: No edema, no clubbing ,no cyanosis Skin: No rashes, no ulcers,no icterus    Data Reviewed: I have personally reviewed following labs and imaging studies  CBC: Recent Labs  Lab 03/27/21 0406 03/28/21 0612 03/29/21 0536 03/30/21 0550 03/31/21 0628  WBC 15.2* 13.3* 19.2* 13.1* 11.5*  NEUTROABS 13.4* 12.3* 17.6* 11.6* 10.3*  HGB 14.2 12.9 13.5 12.5 11.9*  HCT 40.4 35.6* 38.1 35.1* 33.3*  MCV 85.1 83.4 84.7 84.4 84.5  PLT 353 304 395 329 253   Basic Metabolic Panel: Recent Labs  Lab 03/26/21 2236 03/27/21 0406 03/28/21 0612 03/29/21 0536 03/31/21 0628  NA 134* 135 134* 138 136  K 3.8 3.8 3.2* 3.7 3.5  CL 100 96* 98 95* 100  CO2 25 26 28 26 26   GLUCOSE 132* 110* 119* 96 114*  BUN 26* 17 20 23  30*  CREATININE 0.87 0.74 0.72 0.92 0.88  CALCIUM 9.0 8.7* 8.2* 8.7* 8.5*  MG 2.0 1.6*  --   --   --   PHOS  --  2.7  --   --   --      Recent Results (from the past  240 hour(s))  Resp Panel by RT-PCR (Flu A&B, Covid) Nasopharyngeal Swab     Status: Abnormal   Collection Time: 03/26/21 10:36 PM   Specimen: Nasopharyngeal Swab; Nasopharyngeal(NP) swabs in vial transport medium  Result Value Ref Range Status   SARS Coronavirus 2 by RT PCR POSITIVE (A) NEGATIVE Final    Comment: (NOTE) SARS-CoV-2 target nucleic acids are DETECTED.  The SARS-CoV-2 RNA is generally detectable in upper respiratory specimens during the acute phase of infection. Positive results are indicative of the presence of the identified virus, but do not rule out bacterial infection or co-infection with other pathogens not detected by the test. Clinical correlation with patient history and other diagnostic information is necessary to determine patient infection status. The expected result is Negative.  Fact Sheet for Patients: EntrepreneurPulse.com.au  Fact Sheet for Healthcare  Providers: IncredibleEmployment.be  This test is not yet approved or cleared by the [place] FDA and  has been authorized for detection and/or diagnosis of SARS-CoV-2 by FDA under an Emergency Use Authorization (EUA).  This EUA will remain in effect (meaning this test can be used) for the duration of  the COVID-19 declaration under Section 564(b)(1) of the A ct, 21 U.S.C. section 360bbb-3(b)(1), unless the authorization is terminated or revoked sooner.     Influenza A by PCR NEGATIVE NEGATIVE Final   Influenza B by PCR NEGATIVE NEGATIVE Final    Comment: (NOTE) The Xpert Xpress SARS-CoV-2/FLU/RSV plus assay is intended as an aid in the diagnosis of influenza from Nasopharyngeal swab specimens and should not be used as a sole basis for treatment. Nasal washings and aspirates are unacceptable for Xpert Xpress SARS-CoV-2/FLU/RSV testing.  Fact Sheet for Patients: EntrepreneurPulse.com.au  Fact Sheet for Healthcare Providers: IncredibleEmployment.be  This test is not yet approved or cleared by the [place] FDA and has been authorized for detection and/or diagnosis of SARS-CoV-2 by FDA under an Emergency Use Authorization (EUA). This EUA will remain in effect (meaning this test can be used) for the duration of the COVID-19 declaration under Section 564(b)(1) of the Act, 21 U.S.C. section 360bbb-3(b)(1), unless the authorization is terminated or revoked.  Performed at [facility], [address] [place], [place] [address]   Blood Culture (routine x 2)     Status: None (Preliminary result)   Collection Time: 03/26/21 11:08 PM   Specimen: Left Antecubital; Blood  Result Value Ref Range Status   Specimen Description LEFT ANTECUBITAL  Final   Special Requests   Final    BOTTLES DRAWN AEROBIC AND ANAEROBIC Blood Culture adequate volume   Culture   Final    NO GROWTH 4 DAYS Performed at [facility], [address], [place], [place] [address]    Report Status PENDING  Incomplete  Urine Culture     Status: Abnormal   Collection Time: 03/26/21 11:08 PM   Specimen: In/Out Cath Urine  Result Value Ref Range Status   Specimen Description   Final    IN/OUT CATH URINE Performed at [facility], [address], [place], [place] [address]    Special Requests   Final    NONE Performed at [facility], [address] [place], [place] [address]    Culture 10,000 COLONIES/mL ESCHERICHIA COLI (A)  Final   Report Status 03/29/2021 FINAL  Final   Organism ID, Bacteria ESCHERICHIA COLI (A)  Final      Susceptibility   Escherichia coli - MIC*    AMPICILLIN <=2 SENSITIVE Sensitive  CEFAZOLIN <=4 SENSITIVE Sensitive     CEFEPIME <=0.12 SENSITIVE Sensitive     CEFTRIAXONE <=0.25 SENSITIVE Sensitive     CIPROFLOXACIN <=0.25 SENSITIVE Sensitive     GENTAMICIN <=1 SENSITIVE Sensitive     IMIPENEM <=0.25 SENSITIVE Sensitive     NITROFURANTOIN <=16 SENSITIVE Sensitive     TRIMETH/SULFA <=20 SENSITIVE Sensitive     AMPICILLIN/SULBACTAM <=2 SENSITIVE Sensitive     PIP/TAZO <=4 SENSITIVE Sensitive     * 10,000 COLONIES/mL ESCHERICHIA COLI  Blood Culture (routine x 2)     Status: None (Preliminary result)   Collection Time: 03/26/21 11:09 PM   Specimen: Right Antecubital; Blood  Result Value Ref Range Status   Specimen Description RIGHT ANTECUBITAL  Final   Special Requests   Final    BOTTLES DRAWN AEROBIC AND ANAEROBIC Blood Culture results may not be optimal due to an inadequate volume of blood received in culture bottles   Culture   Final    NO GROWTH 4 DAYS Performed at [facility], [address], [place], [place] [address]    Report Status PENDING  Incomplete     Radiology Studies: No results found.  Scheduled Meds:  azithromycin  500 mg Oral Daily   cefdinir  300 mg Oral Daily   chlorhexidine  15 mL Mouth Rinse BID   diltiazem  120 mg Oral  Daily   enoxaparin (LOVENOX) injection  30 mg Subcutaneous Q24H   gabapentin  100 mg Oral TID   levothyroxine  88 mcg Oral Q0600   lisinopril  2.5 mg Oral Daily   mouth rinse  15 mL Mouth Rinse q12n4p   memantine  5 mg Oral BID   nirmatrelvir/ritonavir EUA  3 tablet Oral BID   pantoprazole  40 mg Oral Daily   polyethylene glycol  17 g Oral Daily   predniSONE  10 mg Oral Q breakfast   rivastigmine  3 mg Oral BID   senna-docusate  1 tablet Oral BID   sertraline  25 mg Oral Daily   Continuous Infusions:     LOS: 4 days   Shelly Coss, MD [facility] P3/01/2021, 7:28 AM

## 2021-03-31 NOTE — TOC Progression Note (Signed)
Transition of Care (TOC) - Progression Note  ? ? ?Patient Details  ?Name: Lisa Martin ?MRN: [id number] ?Date of Birth: 12/28/1931 ? ?Transition of Care (TOC) CM/SW Contact  ?Pete Pelt, RN ?Phone Number: ?03/31/2021, 2:44 PM ? ?Clinical Narrative:   Daughter called RNCM, states she is looking into [facility] or [employer] [facility].  TOC notified facilities. ? ? ? ?Expected Discharge Plan: [place] ?Barriers to Discharge: Continued Medical Work up ? ?Expected Discharge Plan and Services ?Expected Discharge Plan: [place] ?  ?  ?Post Acute Care Choice: [place] ?Living arrangements for the past 2 months: [place] ?                ?  ?  ?  ?  ?  ?  ?  ?  ?  ?  ? ? ?Social Determinants of Health (SDOH) Interventions ?  ? ?Readmission Risk Interventions ?No flowsheet data found. ? ?

## 2021-04-01 LAB — CULTURE, BLOOD (ROUTINE X 2)
Culture: NO GROWTH
Culture: NO GROWTH
Special Requests: ADEQUATE

## 2021-04-01 NOTE — Progress Notes (Signed)
Occupational Therapy Treatment ?Patient Details ?Name: MAKENSEY REGO ?MRN: [id number] ?DOB: [birth year] ?Today's Date: 04/01/2021 ? ? ?History of present illness Pt is an 88 y/o F w/ PMH: advanced dementia, hypothyroidism, hypertension, recent kpyohplasty at T7 on 1/30, compression fx to T8 on XR on 2/14 w/ plan for upcoming kyphoplasty who presented to ED via EMS d/t AMS and was found to be COVID +. Adm for sepsis r/t PNA. ?  ?OT comments ? Ms Trunnell was seen for OT treatment on this date. Upon arrival to room pt reclined in bed, agreeable to tx. Pt requires SUPERVISION + RW for ADL t/f and grooming standing sink side - MIN cues for scanning for ADL items. MOD I don/doff B socks in sitting. Pt making good progress toward goals. Pt continues to benefit from skilled OT services to maximize return to PLOF and minimize risk of future falls, injury, caregiver burden, and readmission. Will continue to follow POC. Discharge recommendation updated to reflect pt progress. Pt will require 24/7 assistance at d/c.   ? ?Recommendations for follow up therapy are one component of a multi-disciplinary discharge planning process, led by the attending physician.  Recommendations may be updated based on patient status, additional functional criteria and insurance authorization. ?   ?Follow Up Recommendations ? Home health OT  ?  ?Assistance Recommended at Discharge Frequent or constant Supervision/Assistance  ?Patient can return home with the following ? A little help with walking and/or transfers;A little help with bathing/dressing/bathroom;Direct supervision/assist for medications management;Direct supervision/assist for financial management;Help with stairs or ramp for entrance ?  ?Equipment Recommendations ? BSC/3in1  ?  ?Recommendations for Other Services   ? ?  ?Precautions / Restrictions Precautions ?Precautions: Fall ?Restrictions ?Weight Bearing Restrictions: No  ? ? ?  ? ?Mobility Bed Mobility ?Overal bed mobility: Modified  Independent ?  ?  ?  ?  ?  ?  ?  ?  ? ?Transfers ?Overall transfer level: Needs assistance ?Equipment used: Rolling walker (2 wheels) ?Transfers: Sit to/from Stand ?Sit to Stand: Supervision ?  ?  ?  ?  ?  ?  ?  ?  ?Balance Overall balance assessment: Needs assistance ?Sitting-balance support: Feet supported ?Sitting balance-Leahy Scale: Good ?  ?  ?Standing balance support: No upper extremity supported, During functional activity ?Standing balance-Leahy Scale: Fair ?  ?  ?  ?  ?  ?  ?  ?  ?  ?  ?  ?  ?   ? ?ADL either performed or assessed with clinical judgement  ? ?ADL Overall ADL's : Needs assistance/impaired ?  ?  ?  ?  ?  ?  ?  ?  ?  ?  ?  ?  ?  ?  ?  ?  ?  ?  ?  ?General ADL Comments: SUPERVISION + RW for ADL t/f and grooming standing sink side - MIN cues for scanning for ADL items. MOD I don/doff B socks in sitting ?  ? ? ? ?Cognition Arousal/Alertness: Awake/alert ?Behavior During Therapy: [facility] for tasks assessed/performed ?Overall Cognitive Status: Impaired/Different from baseline ?Area of Impairment: Safety/judgement, Problem solving ?  ?  ?  ?  ?  ?  ?  ?  ?  ?  ?  ?  ?Safety/Judgement: Decreased awareness of safety, Decreased awareness of deficits ?  ?Problem Solving: Requires verbal cues, Requires tactile cues ?  ?  ?  ?   ?   ?   ?   ? ? ?Pertinent Vitals/  Pain       Pain Assessment ?Pain Assessment: No/denies pain ? ? ?Frequency ? Min 2X/week  ? ? ? ? ?  ?Progress Toward Goals ? ?OT Goals(current goals can now be found in the care plan section) ? Progress towards OT goals: Progressing toward goals ? ?Acute Rehab OT Goals ?Patient Stated Goal: to be warm ?OT Goal Formulation: With patient ?Time For Goal Achievement: 04/10/21 ?Potential to Achieve Goals: Fair ?ADL Goals ?Pt Will Perform Upper Body Dressing: with set-up ?Pt Will Perform Lower Body Dressing: with min guard assist ?Pt Will Transfer to Toilet: with min guard assist;ambulating;bedside commode  ?Plan Discharge plan needs to be  updated;Frequency remains appropriate   ? ?Co-evaluation ? ? ?   ?  ?  ?  ?  ? ?  ?AM-PAC OT "6 Clicks" Daily Activity     ?Outcome Measure ? ? Help from another person eating meals?: None ?Help from another person taking care of personal grooming?: A Little ?Help from another person toileting, which includes using toliet, bedpan, or urinal?: A Little ?Help from another person bathing (including washing, rinsing, drying)?: A Little ?Help from another person to put on and taking off regular upper body clothing?: None ?Help from another person to put on and taking off regular lower body clothing?: None ?6 Click Score: 21 ? ?  ?End of Session Equipment Utilized During Treatment: Rolling walker (2 wheels) ? ?OT Visit Diagnosis: Unsteadiness on feet (R26.81);Muscle weakness (generalized) (M62.81) ?  ?Activity Tolerance Patient tolerated treatment well ?  ?Patient Left in bed;with call bell/phone within reach;with bed alarm set;with nursing/sitter in room ?  ?Nurse Communication Mobility status ?  ? ?   ? ?Time: 1749-4496 ?OT Time Calculation (min): 15 min ? ?Charges: OT General Charges ?$OT Visit: 1 Visit ?OT Treatments ?$Self Care/Home Management : 8-22 mins ? ?Dessie Coma, M.S. OTR/L  ?04/01/21, 4:36 PM  ?ascom [phone]' ?

## 2021-04-01 NOTE — Progress Notes (Signed)
PROGRESS NOTE  Lisa Martin  TDV:[id number] DOB: 08-30-1931 DOA: 03/26/2021 PCP: Rusty Aus, MD   Brief Narrative:  Patient is a 88 year old female with history of advanced dementia, hypothyroidism, hypertension who was brought to home to the emergency department for the evaluation of altered mental status.  She was also having nonproductive cough, developed maculopapular rash on her chest and abdomen.  Patient recently had a T7 kyphoplasty on 03/01/2021 and has been planned for another T8 kyphoplasty on 04/02/2021.  On presentation she was hemodynamically stable.  COVID screening test came out to be positive.  Chest x-ray showed left lower lobe infiltrate consistent with pneumonia.  CT head did not show any acute intracranial process.  Patient was admitted for the management of community-acquired pneumonia.  Overall doing better.On room air now.  PT/OT recommending skilled nursing facility on discharge.  Family interested on memory care placement.  Medically stable for discharge as soon as bed is available at skilled nursing facility or memory care.  Prolonged hospitalization due to quarantine status.  As per [facility], she is able to be discharged only on March 7  Assessment & Plan:  Principal Problem:   Acute metabolic encephalopathy Active Problems:   Severe sepsis ([place])   Community acquired pneumonia   Incidental finding of COVID-19 virus infection   Dehydration   Acute prerenal azotemia   Hypertension   GERD (gastroesophageal reflux disease)   Acquired hypothyroidism   Altered mental status: Secondary to acute metabolic encephalopathy from pneumonia/COVID.  Patient has history of advanced dementia.  She is oriented to place only.  Monitor mental status.  Continue supportive care, delirium precautions.  Currently at baseline We resumed home medications.  Sepsis: Suspected sepsis secondary to pneumonia.  Presented with cough, confusion, chest x-ray showing pneumonia.  Lab work showed  leukocytosis.  Blood cultures :NGTD.  Urine culture showed 10,000 colonies of pansensitive E. Coli. Her leukocytosis was contributed by steroids  Community-acquired pneumonia: Chest x-ray was suggestive of left lower lobe infiltrate.  No evidence of groundglass opacity, bilateral infiltrates which is typical of viral pneumonia. Started on azithromycin and ceftriaxone.  Currently on room air.  Changed antibiotics to oral,finished course.  COVID positive: Currently on room air.  Not a started on steroids.  She was not hypoxic. Given Paxlovid.  Dehydration: Found to be dehydrated on presentation,elevated CK.  Started on IV fluids.iv fluids d/ced  Hypertension:   On diltiazem which has been continued.Added lisinopril  Hypothyroidism:Continue Synthyroid  Hypokalemia: Supplemented and corrected  History of generalized anxiety disorder: On Zoloft, Xanax. Resumed  GERD: Continue PPI  Rash: Patient presented with erythematous maculopapular rash scattered on the chest, abdomen.  Continue hydrocortisone cream as needed , prednisone now being tapered and she finished the course.  Etiology not clear. Resolved  Dementia: Patient is disoriented at baseline.  Takes memantine , rivastigmine at home.  These  have been continued.  Delirium precautions  Debility/deconditioning/compression fractures: PT/OT recommending skilled nursing facility on discharge.Uses cane for walking,lives with daughter. Patient has compression fracture.  She underwent T7 kyphoplasty on 03/01/2021.  T8 kyphoplasty has been planned on 04/02/2021         DVT prophylaxis:enoxaparin (LOVENOX) injection 30 mg Start: 03/29/21 2200 SCDs Start: 03/27/21 0036     Code Status: Full Code  Family Communication:: Discussed with daughter on 2/28 on phone  Patient status:inpatient  Patient is from :Home  Anticipated discharge to::SNF vs memory care  Estimated DC date: Medically stable for discharge whenever possible to place in  the  skilled nursing facility/memory care.Needs to finish quarantine.  TOC following   Consultants: None  Procedures:None  Antimicrobials:  Anti-infectives (From admission, onward)    Start     Dose/Rate Route Frequency Ordered Stop   03/30/21 1000  azithromycin (ZITHROMAX) tablet 500 mg        500 mg Oral Daily 03/29/21 1043 03/31/21 1030   03/30/21 1000  cefdinir (OMNICEF) capsule 300 mg        300 mg Oral Daily 03/29/21 1043 03/31/21 1027   03/28/21 2200  nirmatrelvir/ritonavir EUA (PAXLOVID) 3 tablet        3 tablet Oral 2 times daily 03/28/21 1501 04/01/21 0959   03/27/21 0115  nirmatrelvir/ritonavir EUA (renal dosing) (PAXLOVID) 2 tablet  Status:  Discontinued        2 tablet Oral 2 times daily 03/27/21 0106 03/28/21 1501   03/26/21 2300  cefTRIAXone (ROCEPHIN) 2 g in sodium chloride 0.9 % 100 mL IVPB  Status:  Discontinued        2 g 200 mL/hr over 30 Minutes Intravenous Every 24 hours 03/26/21 2258 03/29/21 1043   03/26/21 2300  azithromycin (ZITHROMAX) 500 mg in sodium chloride 0.9 % 250 mL IVPB  Status:  Discontinued        500 mg 250 mL/hr over 60 Minutes Intravenous Every 24 hours 03/26/21 2258 03/29/21 1043      Subjective:  Patient seen and examined at the bedside this morning.  Hemodynamically stable.    Eating her breakfast.  Complains of some back pain today Objective: Vitals:   03/31/21 0539 03/31/21 1327 03/31/21 1900 04/01/21 0736  BP: 139/70 120/65 125/63 138/60  Pulse: 62 74 83 (!) 55  Resp: 15 16 16 18   Temp: 98 F (36.7 C) 98.4 F (36.9 C) 98 F (36.7 C) 98.2 F (36.8 C)  TempSrc: Oral  Oral   SpO2: 97% 97% 98% 97%  Weight:      Height:       No intake or output data in the 24 hours ending 04/01/21 1018   Filed Weights   03/26/21 2137 03/31/21 0515  Weight: 39.5 kg 35.7 kg    Examination:   General exam: Overall comfortable, not in distress, very deconditioned, frail HEENT: PERRL Respiratory system:  no wheezes or crackles   Cardiovascular system: S1 & S2 heard, RRR.  Gastrointestinal system: Abdomen is nondistended, soft and nontender. Central nervous system: Alert and awake but not oriented Extremities: No edema, no clubbing ,no cyanosis Skin: No rashes, no ulcers,no icterus      Data Reviewed: I have personally reviewed following labs and imaging studies  CBC: Recent Labs  Lab 03/27/21 0406 03/28/21 0612 03/29/21 0536 03/30/21 0550 03/31/21 0628  WBC 15.2* 13.3* 19.2* 13.1* 11.5*  NEUTROABS 13.4* 12.3* 17.6* 11.6* 10.3*  HGB 14.2 12.9 13.5 12.5 11.9*  HCT 40.4 35.6* 38.1 35.1* 33.3*  MCV 85.1 83.4 84.7 84.4 84.5  PLT 353 304 395 329 675   Basic Metabolic Panel: Recent Labs  Lab 03/26/21 2236 03/27/21 0406 03/28/21 0612 03/29/21 0536 03/31/21 0628  NA 134* 135 134* 138 136  K 3.8 3.8 3.2* 3.7 3.5  CL 100 96* 98 95* 100  CO2 25 26 28 26 26   GLUCOSE 132* 110* 119* 96 114*  BUN 26* 17 20 23  30*  CREATININE 0.87 0.74 0.72 0.92 0.88  CALCIUM 9.0 8.7* 8.2* 8.7* 8.5*  MG 2.0 1.6*  --   --   --   PHOS  --  2.7  --   --   --      Recent Results (from the past 240 hour(s))  Resp Panel by RT-PCR (Flu A&B, Covid) Nasopharyngeal Swab     Status: Abnormal   Collection Time: 03/26/21 10:36 PM   Specimen: Nasopharyngeal Swab; Nasopharyngeal(NP) swabs in vial transport medium  Result Value Ref Range Status   SARS Coronavirus 2 by RT PCR POSITIVE (A) NEGATIVE Final    Comment: (NOTE) SARS-CoV-2 target nucleic acids are DETECTED.  The SARS-CoV-2 RNA is generally detectable in upper respiratory specimens during the acute phase of infection. Positive results are indicative of the presence of the identified virus, but do not rule out bacterial infection or co-infection with other pathogens not detected by the test. Clinical correlation with patient history and other diagnostic information is necessary to determine patient infection status. The expected result is Negative.  Fact Sheet for  Patients: EntrepreneurPulse.com.au  Fact Sheet for Healthcare Providers: IncredibleEmployment.be  This test is not yet approved or cleared by the [place] FDA and  has been authorized for detection and/or diagnosis of SARS-CoV-2 by FDA under an Emergency Use Authorization (EUA).  This EUA will remain in effect (meaning this test can be used) for the duration of  the COVID-19 declaration under Section 564(b)(1) of the A ct, 21 U.S.C. section 360bbb-3(b)(1), unless the authorization is terminated or revoked sooner.     Influenza A by PCR NEGATIVE NEGATIVE Final   Influenza B by PCR NEGATIVE NEGATIVE Final    Comment: (NOTE) The Xpert Xpress SARS-CoV-2/FLU/RSV plus assay is intended as an aid in the diagnosis of influenza from Nasopharyngeal swab specimens and should not be used as a sole basis for treatment. Nasal washings and aspirates are unacceptable for Xpert Xpress SARS-CoV-2/FLU/RSV testing.  Fact Sheet for Patients: EntrepreneurPulse.com.au  Fact Sheet for Healthcare Providers: IncredibleEmployment.be  This test is not yet approved or cleared by the [place] FDA and has been authorized for detection and/or diagnosis of SARS-CoV-2 by FDA under an Emergency Use Authorization (EUA). This EUA will remain in effect (meaning this test can be used) for the duration of the COVID-19 declaration under Section 564(b)(1) of the Act, 21 U.S.C. section 360bbb-3(b)(1), unless the authorization is terminated or revoked.  Performed at [facility], [address] [place], [place] [address]   Blood Culture (routine x 2)     Status: None   Collection Time: 03/26/21 11:08 PM   Specimen: Left Antecubital; Blood  Result Value Ref Range Status   Specimen Description LEFT ANTECUBITAL  Final   Special Requests   Final    BOTTLES DRAWN AEROBIC AND ANAEROBIC Blood Culture adequate volume   Culture    Final    NO GROWTH 5 DAYS Performed at [facility], [address], [place], [place] [address]    Report Status 04/01/2021 FINAL  Final  Urine Culture     Status: Abnormal   Collection Time: 03/26/21 11:08 PM   Specimen: In/Out Cath Urine  Result Value Ref Range Status   Specimen Description   Final    IN/OUT CATH URINE Performed at [facility], [address], [place], [place] [address]    Special Requests   Final    NONE Performed at [facility], [place], [place] [address]    Culture 10,000 COLONIES/mL ESCHERICHIA COLI (A)  Final   Report Status 03/29/2021 FINAL  Final   Organism ID, Bacteria ESCHERICHIA COLI (A)  Final  Susceptibility   Escherichia coli - MIC*    AMPICILLIN <=2 SENSITIVE Sensitive     CEFAZOLIN <=4 SENSITIVE Sensitive     CEFEPIME <=0.12 SENSITIVE Sensitive     CEFTRIAXONE <=0.25 SENSITIVE Sensitive     CIPROFLOXACIN <=0.25 SENSITIVE Sensitive     GENTAMICIN <=1 SENSITIVE Sensitive     IMIPENEM <=0.25 SENSITIVE Sensitive     NITROFURANTOIN <=16 SENSITIVE Sensitive     TRIMETH/SULFA <=20 SENSITIVE Sensitive     AMPICILLIN/SULBACTAM <=2 SENSITIVE Sensitive     PIP/TAZO <=4 SENSITIVE Sensitive     * 10,000 COLONIES/mL ESCHERICHIA COLI  Blood Culture (routine x 2)     Status: None   Collection Time: 03/26/21 11:09 PM   Specimen: Right Antecubital; Blood  Result Value Ref Range Status   Specimen Description RIGHT ANTECUBITAL  Final   Special Requests   Final    BOTTLES DRAWN AEROBIC AND ANAEROBIC Blood Culture results may not be optimal due to an inadequate volume of blood received in culture bottles   Culture   Final    NO GROWTH 5 DAYS Performed at [facility], [address], [place], [place] [address]    Report Status 04/01/2021 FINAL  Final     Radiology Studies: No results found.  Scheduled Meds:  chlorhexidine  15 mL Mouth Rinse BID   diltiazem  120 mg Oral Daily   enoxaparin  (LOVENOX) injection  30 mg Subcutaneous Q24H   gabapentin  100 mg Oral TID   levothyroxine  88 mcg Oral Q0600   lisinopril  2.5 mg Oral Daily   mouth rinse  15 mL Mouth Rinse q12n4p   memantine  5 mg Oral BID   pantoprazole  40 mg Oral Daily   polyethylene glycol  17 g Oral Daily   predniSONE  10 mg Oral Q breakfast   rivastigmine  3 mg Oral BID   senna-docusate  1 tablet Oral BID   sertraline  25 mg Oral Daily   Continuous Infusions:     LOS: 5 days   Shelly Coss, MD [facility] P3/03/2021, 10:18 AM

## 2021-04-01 NOTE — TOC Progression Note (Addendum)
Transition of Care (TOC) - Progression Note  ? ? ?Patient Details  ?Name: Lisa Martin ?MRN: [id number] ?Date of Birth: [birth year] ? ?Transition of Care (TOC) CM/SW Contact  ?Pete Pelt, RN ?Phone Number: ?04/01/2021, 11:21 AM ? ?Clinical Narrative:   TOC left message for [facility] and [place] today, will continue to follow. ? ?Addendum 0606 hours:  RNCM reached Janett Billow at [facility], who will reach out to daughter and call back if patient is a candidate for their facility  TOC to follow ? ?Expected Discharge Plan: Memory Care ?Barriers to Discharge: Continued Medical Work up ? ?Expected Discharge Plan and Services ?Expected Discharge Plan: Memory Care ?  ?  ?Post Acute Care Choice: [place] ?Living arrangements for the past 2 months: [place] ?                ?  ?  ?  ?  ?  ?  ?  ?  ?  ?  ? ? ?Social Determinants of Health (SDOH) Interventions ?  ? ?Readmission Risk Interventions ?No flowsheet data found. ? ?

## 2021-04-01 NOTE — TOC Progression Note (Addendum)
Transition of Care (TOC) - Progression Note  ? ? ?Patient Details  ?Name: Lisa Martin ?MRN: [id number] ?Date of Birth: [birth year] ? ?Transition of Care (TOC) CM/SW Contact  ?Pete Pelt, RN ?Phone Number: ?04/01/2021, 9:07 AM ? ?Clinical Narrative:   Daughter will speak with [facility], [place] and [place] today.  TOC to follow. ? ? ? ?Expected Discharge Plan:  Memory Care as per daughter. ?Barriers to Discharge: Continued Medical Work up ? ?Expected Discharge Plan and Services ?Expected Discharge Plan: [place] recommended, but now Memory Care as per Daughter. ?  ?  ? ?Living arrangements for the past 2 months: [place] ?                ?  ?  ?  ?  ?  ?  ?  ?  ?  ?  ? ? ?Social Determinants of Health (SDOH) Interventions ?  ? ?Readmission Risk Interventions ?No flowsheet data found. ? ?

## 2021-04-02 ENCOUNTER — Ambulatory Visit: Admission: RE | Admit: 2021-04-02 | Payer: PPO | Source: Ambulatory Visit | Admitting: Radiology

## 2021-04-02 NOTE — Progress Notes (Signed)
PROGRESS NOTE  Lisa Martin  WUJ:[id number] DOB: [birth year] DOA: 03/26/2021 PCP: Rusty Aus, MD   Brief Narrative:  Patient is a 88 year old female with history of advanced dementia, hypothyroidism, hypertension who was brought to home to the emergency department for the evaluation of altered mental status.  She was also having nonproductive cough, developed maculopapular rash on her chest and abdomen.  Patient recently had a T7 kyphoplasty on 03/01/2021 and was planned for another T8 kyphoplasty on 04/02/2021.  On presentation she was hemodynamically stable.  COVID screening test came out to be positive.  Chest x-ray showed left lower lobe infiltrate consistent with pneumonia.  CT head did not show any acute intracranial process.  Patient was admitted for the management of  pneumonia.  Overall doing better.On room air now.  PT/OT recommending skilled nursing facility on discharge.  Family interested on memory care placement.  Medically stable for discharge as soon as bed is available at skilled nursing facility or memory care.  Prolonged hospitalization due to quarantine status.  As per [facility], she is able to be discharged only on March 7  Assessment & Plan:  Principal Problem:   Acute metabolic encephalopathy Active Problems:   Severe sepsis ([place])   Community acquired pneumonia   Incidental finding of COVID-19 virus infection   Dehydration   Acute prerenal azotemia   Hypertension   GERD (gastroesophageal reflux disease)   Acquired hypothyroidism   Altered mental status: Secondary to acute metabolic encephalopathy from pneumonia/COVID.  Patient has history of advanced dementia.  She is oriented to place only.  Monitor mental status.  Continue supportive care, delirium precautions.  Currently at baseline We resumed home medications.  Sepsis: Suspected sepsis secondary to pneumonia.  Presented with cough, confusion, chest x-ray showing pneumonia.  Lab work showed leukocytosis.  Blood cultures  :NGTD.  Urine culture showed 10,000 colonies of pansensitive E. Coli. Her leukocytosis was contributed by steroids  Community-acquired pneumonia: Chest x-ray was suggestive of left lower lobe infiltrate.  No evidence of groundglass opacity, bilateral infiltrates which is typical of viral pneumonia.Started on azithromycin and ceftriaxone for the suspicion of concurrent bacterial pneumonia with COVID.  Currently on room air.  Changed antibiotics to oral,finished course.  COVID positive: Currently on room air.  Not a started on steroids as she was not hypoxic. Given Paxlovid.  Dehydration: Found to be dehydrated on presentation,elevated CK.  Started on IV fluids.iv fluids d/ced normal  Hypertension:   On diltiazem which has been continued.Added lisinopril  Hypothyroidism:Continue Synthyroid  History of generalized anxiety disorder: On Zoloft, Xanax. Resumed  GERD: Continue PPI  Rash: Patient presented with erythematous maculopapular rash scattered on the chest, abdomen.  Continue hydrocortisone cream as needed , prednisone being tapered and she finished the course.  Etiology not clear. Resolved  Dementia: Patient is disoriented at baseline.  Takes memantine , rivastigmine at home.  These  have been continued.  Delirium precautions  Debility/deconditioning/compression fractures: PT/OT recommending skilled nursing facility on discharge.Uses cane for walking,lives with daughter. Patient has compression fracture.  She underwent T7 kyphoplasty on 03/01/2021.  T8 kyphoplasty was planned on 04/02/2021, IR aware and they have postponed it         DVT prophylaxis:enoxaparin (LOVENOX) injection 30 mg Start: 03/29/21 2200 SCDs Start: 03/27/21 0036     Code Status: Full Code  Family Communication:: Discussed with daughter today on phone  Patient status:inpatient  Patient is from :Home  Anticipated discharge to::SNF vs memory care  Estimated DC date: Medically stable  for discharge whenever  possible to place in the skilled nursing facility/memory care.Needs to finish quarantine.  TOC following   Consultants: None  Procedures:None  Antimicrobials:  Anti-infectives (From admission, onward)    Start     Dose/Rate Route Frequency Ordered Stop   03/30/21 1000  azithromycin (ZITHROMAX) tablet 500 mg        500 mg Oral Daily 03/29/21 1043 03/31/21 1030   03/30/21 1000  cefdinir (OMNICEF) capsule 300 mg        300 mg Oral Daily 03/29/21 1043 03/31/21 1027   03/28/21 2200  nirmatrelvir/ritonavir EUA (PAXLOVID) 3 tablet        3 tablet Oral 2 times daily 03/28/21 1501 04/01/21 0959   03/27/21 0115  nirmatrelvir/ritonavir EUA (renal dosing) (PAXLOVID) 2 tablet  Status:  Discontinued        2 tablet Oral 2 times daily 03/27/21 0106 03/28/21 1501   03/26/21 2300  cefTRIAXone (ROCEPHIN) 2 g in sodium chloride 0.9 % 100 mL IVPB  Status:  Discontinued        2 g 200 mL/hr over 30 Minutes Intravenous Every 24 hours 03/26/21 2258 03/29/21 1043   03/26/21 2300  azithromycin (ZITHROMAX) 500 mg in sodium chloride 0.9 % 250 mL IVPB  Status:  Discontinued        500 mg 250 mL/hr over 60 Minutes Intravenous Every 24 hours 03/26/21 2258 03/29/21 1043      Subjective:   Patient seen and examined at the bedside this morning.  Hemodynamically stable.  Comfortable.  On room air.  Oriented to place only.  Complains of some back pain.  Otherwise looks stable.  Sitter at the bedside  Objective: Vitals:   04/01/21 2024 04/02/21 0500 04/02/21 0540 04/02/21 0844  BP: 113/61  (!) 141/66 111/71  Pulse: 63  67 72  Resp: 16  16 16   Temp: 98.5 F (36.9 C)  99.3 F (37.4 C) 98.1 F (36.7 C)  TempSrc: Oral   Oral  SpO2: 97%  97% 95%  Weight:  38.4 kg    Height:        Intake/Output Summary (Last 24 hours) at 04/02/2021 1109 Last data filed at 04/02/2021 6301 Gross per 24 hour  Intake 20 ml  Output --  Net 20 ml     Filed Weights   03/26/21 2137 03/31/21 0515 04/02/21 0500  Weight: 39.5 kg  35.7 kg 38.4 kg    Examination:   General exam: Overall comfortable, not in distress, very deconditioned, frail HEENT: PERRL Respiratory system:  no wheezes or crackles  Cardiovascular system: S1 & S2 heard, RRR.  Gastrointestinal system: Abdomen is nondistended, soft and nontender. Central nervous system: Alert and awake, oriented to place only Extremities: No edema, no clubbing ,no cyanosis Skin: No rashes, no ulcers,no icterus      Data Reviewed: I have personally reviewed following labs and imaging studies  CBC: Recent Labs  Lab 03/27/21 0406 03/28/21 0612 03/29/21 0536 03/30/21 0550 03/31/21 0628  WBC 15.2* 13.3* 19.2* 13.1* 11.5*  NEUTROABS 13.4* 12.3* 17.6* 11.6* 10.3*  HGB 14.2 12.9 13.5 12.5 11.9*  HCT 40.4 35.6* 38.1 35.1* 33.3*  MCV 85.1 83.4 84.7 84.4 84.5  PLT 353 304 395 329 601   Basic Metabolic Panel: Recent Labs  Lab 03/26/21 2236 03/27/21 0406 03/28/21 0612 03/29/21 0536 03/31/21 0628  NA 134* 135 134* 138 136  K 3.8 3.8 3.2* 3.7 3.5  CL 100 96* 98 95* 100  CO2 25 26 28 26  26  GLUCOSE 132* 110* 119* 96 114*  BUN 26* 17 20 23  30*  CREATININE 0.87 0.74 0.72 0.92 0.88  CALCIUM 9.0 8.7* 8.2* 8.7* 8.5*  MG 2.0 1.6*  --   --   --   PHOS  --  2.7  --   --   --      Recent Results (from the past 240 hour(s))  Resp Panel by RT-PCR (Flu A&B, Covid) Nasopharyngeal Swab     Status: Abnormal   Collection Time: 03/26/21 10:36 PM   Specimen: Nasopharyngeal Swab; Nasopharyngeal(NP) swabs in vial transport medium  Result Value Ref Range Status   SARS Coronavirus 2 by RT PCR POSITIVE (A) NEGATIVE Final    Comment: (NOTE) SARS-CoV-2 target nucleic acids are DETECTED.  The SARS-CoV-2 RNA is generally detectable in upper respiratory specimens during the acute phase of infection. Positive results are indicative of the presence of the identified virus, but do not rule out bacterial infection or co-infection with other pathogens not detected by the test.  Clinical correlation with patient history and other diagnostic information is necessary to determine patient infection status. The expected result is Negative.  Fact Sheet for Patients: EntrepreneurPulse.com.au  Fact Sheet for Healthcare Providers: IncredibleEmployment.be  This test is not yet approved or cleared by the [place] FDA and  has been authorized for detection and/or diagnosis of SARS-CoV-2 by FDA under an Emergency Use Authorization (EUA).  This EUA will remain in effect (meaning this test can be used) for the duration of  the COVID-19 declaration under Section 564(b)(1) of the A ct, 21 U.S.C. section 360bbb-3(b)(1), unless the authorization is terminated or revoked sooner.     Influenza A by PCR NEGATIVE NEGATIVE Final   Influenza B by PCR NEGATIVE NEGATIVE Final    Comment: (NOTE) The Xpert Xpress SARS-CoV-2/FLU/RSV plus assay is intended as an aid in the diagnosis of influenza from Nasopharyngeal swab specimens and should not be used as a sole basis for treatment. Nasal washings and aspirates are unacceptable for Xpert Xpress SARS-CoV-2/FLU/RSV testing.  Fact Sheet for Patients: EntrepreneurPulse.com.au  Fact Sheet for Healthcare Providers: IncredibleEmployment.be  This test is not yet approved or cleared by the [place] FDA and has been authorized for detection and/or diagnosis of SARS-CoV-2 by FDA under an Emergency Use Authorization (EUA). This EUA will remain in effect (meaning this test can be used) for the duration of the COVID-19 declaration under Section 564(b)(1) of the Act, 21 U.S.C. section 360bbb-3(b)(1), unless the authorization is terminated or revoked.  Performed at [facility], [address] [place], [place] [address]   Blood Culture (routine x 2)     Status: None   Collection Time: 03/26/21 11:08 PM   Specimen: Left Antecubital; Blood  Result  Value Ref Range Status   Specimen Description LEFT ANTECUBITAL  Final   Special Requests   Final    BOTTLES DRAWN AEROBIC AND ANAEROBIC Blood Culture adequate volume   Culture   Final    NO GROWTH 5 DAYS Performed at [facility], [address] [place], [place] [address]    Report Status 04/01/2021 FINAL  Final  Urine Culture     Status: Abnormal   Collection Time: 03/26/21 11:08 PM   Specimen: In/Out Cath Urine  Result Value Ref Range Status   Specimen Description   Final    IN/OUT CATH URINE Performed at [facility], [address], [place], [place] [address]    Special Requests   Final  NONE Performed at [facility], [place], [place] [address]    Culture 10,000 COLONIES/mL ESCHERICHIA COLI (A)  Final   Report Status 03/29/2021 FINAL  Final   Organism ID, Bacteria ESCHERICHIA COLI (A)  Final      Susceptibility   Escherichia coli - MIC*    AMPICILLIN <=2 SENSITIVE Sensitive     CEFAZOLIN <=4 SENSITIVE Sensitive     CEFEPIME <=0.12 SENSITIVE Sensitive     CEFTRIAXONE <=0.25 SENSITIVE Sensitive     CIPROFLOXACIN <=0.25 SENSITIVE Sensitive     GENTAMICIN <=1 SENSITIVE Sensitive     IMIPENEM <=0.25 SENSITIVE Sensitive     NITROFURANTOIN <=16 SENSITIVE Sensitive     TRIMETH/SULFA <=20 SENSITIVE Sensitive     AMPICILLIN/SULBACTAM <=2 SENSITIVE Sensitive     PIP/TAZO <=4 SENSITIVE Sensitive     * 10,000 COLONIES/mL ESCHERICHIA COLI  Blood Culture (routine x 2)     Status: None   Collection Time: 03/26/21 11:09 PM   Specimen: Right Antecubital; Blood  Result Value Ref Range Status   Specimen Description RIGHT ANTECUBITAL  Final   Special Requests   Final    BOTTLES DRAWN AEROBIC AND ANAEROBIC Blood Culture results may not be optimal due to an inadequate volume of blood received in culture bottles   Culture   Final    NO GROWTH 5 DAYS Performed at [facility], [address] [place], [place] [address]    Report  Status 04/01/2021 FINAL  Final     Radiology Studies: No results found.  Scheduled Meds:  chlorhexidine  15 mL Mouth Rinse BID   diltiazem  120 mg Oral Daily   enoxaparin (LOVENOX) injection  30 mg Subcutaneous Q24H   gabapentin  100 mg Oral TID   levothyroxine  88 mcg Oral Q0600   lisinopril  2.5 mg Oral Daily   mouth rinse  15 mL Mouth Rinse q12n4p   memantine  5 mg Oral BID   pantoprazole  40 mg Oral Daily   polyethylene glycol  17 g Oral Daily   rivastigmine  3 mg Oral BID   senna-docusate  1 tablet Oral BID   sertraline  25 mg Oral Daily   Continuous Infusions:     LOS: 6 days   Shelly Coss, MD [facility] P3/03/2021, 11:09 AM

## 2021-04-03 LAB — CBC WITH DIFFERENTIAL/PLATELET
Abs Immature Granulocytes: 0.09 10*3/uL — ABNORMAL HIGH (ref 0.00–0.07)
Basophils Absolute: 0 10*3/uL (ref 0.0–0.1)
Basophils Relative: 0 %
Eosinophils Absolute: 0 10*3/uL (ref 0.0–0.5)
Eosinophils Relative: 0 %
HCT: 34.8 % — ABNORMAL LOW (ref 36.0–46.0)
Hemoglobin: 12.2 g/dL (ref 12.0–15.0)
Immature Granulocytes: 1 %
Lymphocytes Relative: 4 %
Lymphs Abs: 0.5 10*3/uL — ABNORMAL LOW (ref 0.7–4.0)
MCH: 30 pg (ref 26.0–34.0)
MCHC: 35.1 g/dL (ref 30.0–36.0)
MCV: 85.5 fL (ref 80.0–100.0)
Monocytes Absolute: 0.9 10*3/uL (ref 0.1–1.0)
Monocytes Relative: 7 %
Neutro Abs: 10.5 10*3/uL — ABNORMAL HIGH (ref 1.7–7.7)
Neutrophils Relative %: 88 %
Platelets: 304 10*3/uL (ref 150–400)
RBC: 4.07 MIL/uL (ref 3.87–5.11)
RDW: 15.7 % — ABNORMAL HIGH (ref 11.5–15.5)
WBC: 12 10*3/uL — ABNORMAL HIGH (ref 4.0–10.5)
nRBC: 0 % (ref 0.0–0.2)

## 2021-04-03 LAB — BASIC METABOLIC PANEL
Anion gap: 8 (ref 5–15)
BUN: 19 mg/dL (ref 8–23)
CO2: 30 mmol/L (ref 22–32)
Calcium: 8.6 mg/dL — ABNORMAL LOW (ref 8.9–10.3)
Chloride: 99 mmol/L (ref 98–111)
Creatinine, Ser: 0.83 mg/dL (ref 0.44–1.00)
GFR, Estimated: >60 mL/min (ref >60)
Glucose, Bld: 117 mg/dL — ABNORMAL HIGH (ref 70–99)
Potassium: 4 mmol/L (ref 3.5–5.1)
Sodium: 137 mmol/L (ref 135–145)

## 2021-04-03 NOTE — Hospital Course (Addendum)
88 year old female with history of advanced dementia, hypothyroidism, hypertension who was brought to home to the emergency department for the evaluation of altered mental status.  She was also having nonproductive cough, developed maculopapular rash on her chest and abdomen.  Patient recently had a T7 kyphoplasty on 03/01/2021 and was planned for another T8 kyphoplasty on 04/02/2021.  On presentation she was hemodynamically stable.  COVID screening test came out to be positive.  Chest x-ray showed left lower lobe infiltrate consistent with pneumonia.  CT head did not show any acute intracranial process.  Patient was admitted and treated for pneumonia, overall doing well remains on room air seen by PT OT has history skilled nursing facility and family interested on memory care placement.   ? ?Due to patient's COVID remains on isolation until 04/06/2021 when she will be ready for discharge to skilled nursing facility. ? ?Patient remained stable, pleasantly confused, oriented to self only.  No sign of any agitation or need for sitter noted.  She is being discharged to rehab as recommended by our PT. ?She might need care at the memory care unit which can be determined at rehab. ? ?Patient will continue with her home medications and need to have a close follow-up with her providers for further recommendations. ?

## 2021-04-03 NOTE — Progress Notes (Signed)
PROGRESS NOTE Lisa Martin  JQB:[id number] DOB: [birth year] DOA: 03/26/2021 PCP: Rusty Aus, MD   Brief Narrative/Hospital Course: 88 year old female with history of advanced dementia, hypothyroidism, hypertension who was brought to home to the emergency department for the evaluation of altered mental status.  She was also having nonproductive cough, developed maculopapular rash on her chest and abdomen.  Patient recently had a T7 kyphoplasty on 03/01/2021 and was planned for another T8 kyphoplasty on 04/02/2021.  On presentation she was hemodynamically stable.  COVID screening test came out to be positive.  Chest x-ray showed left lower lobe infiltrate consistent with pneumonia.  CT head did not show any acute intracranial process.  Patient was admitted and treated for pneumonia, overall doing well remains on room air seen by PT OT has history skilled nursing facility and family interested on memory care placement.  Due to patient's COVID remains on isolation until 04/06/2021 when she will be ready for discharge to skilled nursing facility.    Subjective: Seen and examined this morning alert awake pleasant not in distress oriented to self not to location  Assessment and Plan:  Acute metabolic encephalopathy due to pneumonia/COVID/UTI Dementia: Patient is disoriented at baseline, had worsening confusion due to her pneumonia and COVID infection.  At this time she appears pleasantly confused alert awake, continue supportive care for progressive delirium precaution, PT OT  Sepsis POA due to pneumonia and E. coli UTI  Community-acquired pneumonia: Sepsis physiology resolved completed antibiotics.  Chest x-ray had left lower lobe infiltrate.  Currently doing well on room air.  COVID positive: Currently on room air.  Pleated Paxlovid 3/2.  Doing well on room air.  On isolation until 3/7.   Dehydration Mild rhabdomyolysis Treated with IV fluids.  Encourage p.o. now.   Hypertension: Stable on Cardizem,  lisinopril also added.   Hypothyroidism:Continue Synthyroid  GAD mood stable on Zoloft Xanax. GERD: Continue PPI Rash: Patient presented with erythematous maculopapular rash scattered on the chest, abdomen. Continue hydrocortisone cream as needed , prednisone being tapered and she finished the course. Etiology not clear. Resolved  Debility/deconditioning/compression fractures: PT/OT recommending skilled nursing facility on discharge.Uses cane for walking,lives with daughter.  Patient has compression fracture.  She underwent T7 kyphoplasty on 03/01/2021.  T8 kyphoplasty was planned on 04/02/2021, IR aware and they have postponed it  DVT prophylaxis: enoxaparin (LOVENOX) injection 30 mg Start: 03/29/21 2200 SCDs Start: 03/27/21 0036 Code Status:   Code Status: Full Code Family Communication: plan of care discussed with patient/RN at bedside.  Disposition: Currently is medically stable for discharge. Status is: Inpatient Remains inpatient appropriate because: Holding briskly for bed placement after completing isolation 3/7 Objective: Vitals last 24 hrs: Vitals:   04/02/21 2318 04/03/21 0500 04/03/21 0626 04/03/21 0739  BP: (!) 144/69  (!) 150/86 (!) 159/82  Pulse: (!) 58  75 77  Resp: '18  16 18  '$ Temp: 98.9 F (37.2 C)  98.6 F (37 C) 98.3 F (36.8 C)  TempSrc:      SpO2: 97%  98% 100%  Weight:  35.1 kg    Height:       Weight change: -3.3 kg  Physical Examination: General exam: AA0x1,older than stated age, weak appearing. HEENT:Oral mucosa moist, Ear/Nose WNL grossly, dentition normal. Respiratory system: bilaterally diminished BS, no use of accessory muscle Cardiovascular system: S1 & S2 +, No JVD,. Gastrointestinal system: Abdomen soft,NT,ND, BS+ Nervous System:Alert, awake, moving extremities and grossly nonfocal Extremities: LE edema none,distal peripheral pulses palpable.  Skin: No rashes,no  icterus. MSK: Normal muscle bulk,tone, power  Medications reviewed:   Scheduled Meds:  chlorhexidine  15 mL Mouth Rinse BID   diltiazem  120 mg Oral Daily   enoxaparin (LOVENOX) injection  30 mg Subcutaneous Q24H   gabapentin  100 mg Oral TID   levothyroxine  88 mcg Oral Q0600   lisinopril  2.5 mg Oral Daily   mouth rinse  15 mL Mouth Rinse q12n4p   memantine  5 mg Oral BID   pantoprazole  40 mg Oral Daily   polyethylene glycol  17 g Oral Daily   rivastigmine  3 mg Oral BID   senna-docusate  1 tablet Oral BID   sertraline  25 mg Oral Daily   Continuous Infusions:    Diet Order             Diet regular Room service appropriate? Yes; Fluid consistency: Thin  Diet effective now                            Intake/Output Summary (Last 24 hours) at 04/03/2021 1129 Last data filed at 04/02/2021 1700 Gross per 24 hour  Intake 240 ml  Output --  Net 240 ml   Net IO Since Admission: 3,645.4 mL [04/03/21 1129]  Wt Readings from Last 3 Encounters:  04/03/21 35.1 kg  03/01/21 44.5 kg  02/09/21 43.1 kg     Unresulted Labs (From admission, onward)     Start     Ordered   03/27/21 0700  Methylmalonic acid, serum  Once,   R        03/27/21 0700          Data Reviewed: I have personally reviewed following labs and imaging studies CBC: Recent Labs  Lab 03/28/21 0612 03/29/21 0536 03/30/21 0550 03/31/21 0628 04/03/21 0644  WBC 13.3* 19.2* 13.1* 11.5* 12.0*  NEUTROABS 12.3* 17.6* 11.6* 10.3* 10.5*  HGB 12.9 13.5 12.5 11.9* 12.2  HCT 35.6* 38.1 35.1* 33.3* 34.8*  MCV 83.4 84.7 84.4 84.5 85.5  PLT 304 395 329 325 209   Basic Metabolic Panel: Recent Labs  Lab 03/28/21 0612 03/29/21 0536 03/31/21 0628 04/03/21 0644  NA 134* 138 136 137  K 3.2* 3.7 3.5 4.0  CL 98 95* 100 99  CO2 '28 26 26 30  '$ GLUCOSE 119* 96 114* 117*  BUN 20 23 30* 19  CREATININE 0.72 0.92 0.88 0.83  CALCIUM 8.2* 8.7* 8.5* 8.6*   GFR: Estimated Creatinine Clearance: 26.5 mL/min (by C-G formula based on SCr of 0.83 mg/dL). Liver Function Tests: No results  for input(s): AST, ALT, ALKPHOS, BILITOT, PROT, ALBUMIN in the last 168 hours. No results for input(s): LIPASE, AMYLASE in the last 168 hours. No results for input(s): AMMONIA in the last 168 hours. Coagulation Profile: No results for input(s): INR, PROTIME in the last 168 hours. Cardiac Enzymes: Recent Labs  Lab 03/29/21 0536  CKTOTAL 659*   BNP (last 3 results) No results for input(s): PROBNP in the last 8760 hours. HbA1C: No results for input(s): HGBA1C in the last 72 hours. CBG: Recent Labs  Lab 03/29/21 0935  GLUCAP 105*   Lipid Profile: No results for input(s): CHOL, HDL, LDLCALC, TRIG, CHOLHDL, LDLDIRECT in the last 72 hours. Thyroid Function Tests: No results for input(s): TSH, T4TOTAL, FREET4, T3FREE, THYROIDAB in the last 72 hours. Anemia Panel: No results for input(s): VITAMINB12, FOLATE, FERRITIN, TIBC, IRON, RETICCTPCT in the last 72 hours. Sepsis Labs: Recent Labs  Lab 03/28/21 0612  PROCALCITON <0.10    Recent Results (from the past 240 hour(s))  Resp Panel by RT-PCR (Flu A&B, Covid) Nasopharyngeal Swab     Status: Abnormal   Collection Time: 03/26/21 10:36 PM   Specimen: Nasopharyngeal Swab; Nasopharyngeal(NP) swabs in vial transport medium  Result Value Ref Range Status   SARS Coronavirus 2 by RT PCR POSITIVE (A) NEGATIVE Final    Comment: (NOTE) SARS-CoV-2 target nucleic acids are DETECTED.  The SARS-CoV-2 RNA is generally detectable in upper respiratory specimens during the acute phase of infection. Positive results are indicative of the presence of the identified virus, but do not rule out bacterial infection or co-infection with other pathogens not detected by the test. Clinical correlation with patient history and other diagnostic information is necessary to determine patient infection status. The expected result is Negative.  Fact Sheet for Patients: EntrepreneurPulse.com.au  Fact Sheet for Healthcare  Providers: IncredibleEmployment.be  This test is not yet approved or cleared by the [place] FDA and  has been authorized for detection and/or diagnosis of SARS-CoV-2 by FDA under an Emergency Use Authorization (EUA).  This EUA will remain in effect (meaning this test can be used) for the duration of  the COVID-19 declaration under Section 564(b)(1) of the A ct, 21 U.S.C. section 360bbb-3(b)(1), unless the authorization is terminated or revoked sooner.     Influenza A by PCR NEGATIVE NEGATIVE Final   Influenza B by PCR NEGATIVE NEGATIVE Final    Comment: (NOTE) The Xpert Xpress SARS-CoV-2/FLU/RSV plus assay is intended as an aid in the diagnosis of influenza from Nasopharyngeal swab specimens and should not be used as a sole basis for treatment. Nasal washings and aspirates are unacceptable for Xpert Xpress SARS-CoV-2/FLU/RSV testing.  Fact Sheet for Patients: EntrepreneurPulse.com.au  Fact Sheet for Healthcare Providers: IncredibleEmployment.be  This test is not yet approved or cleared by the [place] FDA and has been authorized for detection and/or diagnosis of SARS-CoV-2 by FDA under an Emergency Use Authorization (EUA). This EUA will remain in effect (meaning this test can be used) for the duration of the COVID-19 declaration under Section 564(b)(1) of the Act, 21 U.S.C. section 360bbb-3(b)(1), unless the authorization is terminated or revoked.  Performed at [facility], [address] [place], [place] [address]   Blood Culture (routine x 2)     Status: None   Collection Time: 03/26/21 11:08 PM   Specimen: Left Antecubital; Blood  Result Value Ref Range Status   Specimen Description LEFT ANTECUBITAL  Final   Special Requests   Final    BOTTLES DRAWN AEROBIC AND ANAEROBIC Blood Culture adequate volume   Culture   Final    NO GROWTH 5 DAYS Performed at [facility], [address] [place], [place] [address]    Report Status 04/01/2021 FINAL  Final  Urine Culture     Status: Abnormal   Collection Time: 03/26/21 11:08 PM   Specimen: In/Out Cath Urine  Result Value Ref Range Status   Specimen Description   Final    IN/OUT CATH URINE Performed at [facility], [address], [place], [place] [address]    Special Requests   Final    NONE Performed at [facility], [place], [place] [address]    Culture 10,000 COLONIES/mL ESCHERICHIA COLI (A)  Final   Report Status 03/29/2021 FINAL  Final   Organism ID, Bacteria ESCHERICHIA COLI (A)  Final      Susceptibility  Escherichia coli - MIC*    AMPICILLIN <=2 SENSITIVE Sensitive     CEFAZOLIN <=4 SENSITIVE Sensitive     CEFEPIME <=0.12 SENSITIVE Sensitive     CEFTRIAXONE <=0.25 SENSITIVE Sensitive     CIPROFLOXACIN <=0.25 SENSITIVE Sensitive     GENTAMICIN <=1 SENSITIVE Sensitive     IMIPENEM <=0.25 SENSITIVE Sensitive     NITROFURANTOIN <=16 SENSITIVE Sensitive     TRIMETH/SULFA <=20 SENSITIVE Sensitive     AMPICILLIN/SULBACTAM <=2 SENSITIVE Sensitive     PIP/TAZO <=4 SENSITIVE Sensitive     * 10,000 COLONIES/mL ESCHERICHIA COLI  Blood Culture (routine x 2)     Status: None   Collection Time: 03/26/21 11:09 PM   Specimen: Right Antecubital; Blood  Result Value Ref Range Status   Specimen Description RIGHT ANTECUBITAL  Final   Special Requests   Final    BOTTLES DRAWN AEROBIC AND ANAEROBIC Blood Culture results may not be optimal due to an inadequate volume of blood received in culture bottles   Culture   Final    NO GROWTH 5 DAYS Performed at [facility], [address] [place], [place] [address]    Report Status 04/01/2021 FINAL  Final    Antimicrobials: Anti-infectives (From admission, onward)    Start     Dose/Rate Route Frequency Ordered Stop   03/30/21 1000  azithromycin (ZITHROMAX) tablet 500 mg        500 mg Oral Daily 03/29/21 1043 03/31/21 1030    03/30/21 1000  cefdinir (OMNICEF) capsule 300 mg        300 mg Oral Daily 03/29/21 1043 03/31/21 1027   03/28/21 2200  nirmatrelvir/ritonavir EUA (PAXLOVID) 3 tablet        3 tablet Oral 2 times daily 03/28/21 1501 04/01/21 0959   03/27/21 0115  nirmatrelvir/ritonavir EUA (renal dosing) (PAXLOVID) 2 tablet  Status:  Discontinued        2 tablet Oral 2 times daily 03/27/21 0106 03/28/21 1501   03/26/21 2300  cefTRIAXone (ROCEPHIN) 2 g in sodium chloride 0.9 % 100 mL IVPB  Status:  Discontinued        2 g 200 mL/hr over 30 Minutes Intravenous Every 24 hours 03/26/21 2258 03/29/21 1043   03/26/21 2300  azithromycin (ZITHROMAX) 500 mg in sodium chloride 0.9 % 250 mL IVPB  Status:  Discontinued        500 mg 250 mL/hr over 60 Minutes Intravenous Every 24 hours 03/26/21 2258 03/29/21 1043      Culture/Microbiology    Component Value Date/Time   SDES RIGHT ANTECUBITAL 03/26/2021 2309   SPECREQUEST  03/26/2021 2309    BOTTLES DRAWN AEROBIC AND ANAEROBIC Blood Culture results may not be optimal due to an inadequate volume of blood received in culture bottles   CULT  03/26/2021 2309    NO GROWTH 5 DAYS Performed at [facility], [address], [place], [place] [address]    REPTSTATUS 04/01/2021 FINAL 03/26/2021 2309    Other culture-see note  Radiology Studies: No results found.   LOS: 7 days   Antonieta Pert, MD [facility]  04/03/2021, 11:29 AM

## 2021-04-04 NOTE — Progress Notes (Signed)
Patient refused enoxaparin '30mg'$  Inj. Approached the patient again and the patient still refused. Patient is able to ambulate in room using a front wheel walker with assistance. ?

## 2021-04-04 NOTE — Progress Notes (Signed)
PROGRESS NOTE Lisa Martin  HYI:[id number] DOB: March 24, 1931 DOA: 03/26/2021 PCP: Rusty Aus, MD   Brief Narrative/Hospital Course: 88 year old female with history of advanced dementia, hypothyroidism, hypertension who was brought to home to the emergency department for the evaluation of altered mental status.  She was also having nonproductive cough, developed maculopapular rash on her chest and abdomen.  Patient recently had a T7 kyphoplasty on 03/01/2021 and was planned for another T8 kyphoplasty on 04/02/2021.  On presentation she was hemodynamically stable.  COVID screening test came out to be positive.  Chest x-ray showed left lower lobe infiltrate consistent with pneumonia.  CT head did not show any acute intracranial process.  Patient was admitted and treated for pneumonia, overall doing well remains on room air seen by PT OT has history skilled nursing facility and family interested on memory care placement.  Due to patient's COVID remains on isolation until 04/06/2021 when she will be ready for discharge to skilled nursing facility.    Subjective: Seen and examined at bedside.  She has no new complaints.  Assessment and Plan:  Acute metabolic encephalopathy due to pneumonia/COVID/UTI Dementia: Patient is disoriented at baseline, had worsening confusion due to her pneumonia and COVID infection.  At this time she appears pleasantly confused alert awake, continue supportive care for progressive delirium precaution, PT OT  Sepsis POA due to pneumonia and E. coli UTI  Community-acquired pneumonia: Sepsis physiology resolved completed antibiotics.  Chest x-ray had left lower lobe infiltrate.  Currently doing well on room air.  COVID positive: Currently on room air.  Pleated Paxlovid 3/2.  Doing well on room air.  On isolation until 3/7.   Dehydration Mild rhabdomyolysis Treated with IV fluids.  Encourage p.o. now.   Hypertension: Stable on Cardizem, lisinopril also added.    Hypothyroidism:Continue Synthyroid  GAD mood stable on Zoloft Xanax. GERD: Continue PPI Rash: Patient presented with erythematous maculopapular rash scattered on the chest, abdomen. Continue hydrocortisone cream as needed , prednisone being tapered and she finished the course. Etiology not clear. Resolved  Debility/deconditioning/compression fractures: PT/OT recommending skilled nursing facility on discharge.Uses cane for walking,lives with daughter.  Patient has compression fracture.  She underwent T7 kyphoplasty on 03/01/2021.  T8 kyphoplasty was planned on 04/02/2021, IR aware and they have postponed it  DVT prophylaxis: enoxaparin (LOVENOX) injection 30 mg Start: 03/29/21 2200 SCDs Start: 03/27/21 0036 Code Status:   Code Status: Full Code Family Communication: plan of care discussed with patient/RN at bedside.  Disposition: Currently is medically stable for discharge. Status is: Inpatient Remains inpatient appropriate because: Holding briskly for bed placement after completing isolation 3/7 Objective: Vitals last 24 hrs: Vitals:   04/03/21 2016 04/04/21 0415 04/04/21 0716 04/04/21 1103  BP: (!) 155/73 (!) 176/96 (!) 153/85 134/71  Pulse: 86 79 66 65  Resp:  '16 17 16  '$ Temp:  98.7 F (37.1 C) 98.7 F (37.1 C) 98 F (36.7 C)  TempSrc:      SpO2: 97% 98% 97% 100%  Weight:      Height:       Weight change:   Physical Examination: General exam: AA0x1,older than stated age, weak appearing. HEENT:Oral mucosa moist, Ear/Nose WNL grossly, dentition normal. Respiratory system: bilaterally diminished BS, no use of accessory muscle Cardiovascular system: S1 & S2 +, No JVD,. Gastrointestinal system: Abdomen soft,NT,ND, BS+ Nervous System:Alert, awake, moving extremities and grossly nonfocal Extremities: LE edema none,distal peripheral pulses palpable.  Skin: No rashes,no icterus. MSK: Normal muscle bulk,tone, power  Medications reviewed:  Scheduled Meds:  chlorhexidine  15 mL  Mouth Rinse BID   diltiazem  120 mg Oral Daily   enoxaparin (LOVENOX) injection  30 mg Subcutaneous Q24H   gabapentin  100 mg Oral TID   levothyroxine  88 mcg Oral Q0600   lisinopril  2.5 mg Oral Daily   mouth rinse  15 mL Mouth Rinse q12n4p   memantine  5 mg Oral BID   pantoprazole  40 mg Oral Daily   polyethylene glycol  17 g Oral Daily   rivastigmine  3 mg Oral BID   senna-docusate  1 tablet Oral BID   sertraline  25 mg Oral Daily   Continuous Infusions:    Diet Order             Diet regular Room service appropriate? Yes; Fluid consistency: Thin  Diet effective now                           No intake or output data in the 24 hours ending 04/04/21 1429  Net IO Since Admission: 3,645.4 mL [04/04/21 1429]  Wt Readings from Last 3 Encounters:  04/03/21 35.1 kg  03/01/21 44.5 kg  02/09/21 43.1 kg     Unresulted Labs (From admission, onward)     Start     Ordered   03/27/21 0700  Methylmalonic acid, serum  Once,   R        03/27/21 0700          Data Reviewed: I have personally reviewed following labs and imaging studies CBC: Recent Labs  Lab 03/29/21 0536 03/30/21 0550 03/31/21 0628 04/03/21 0644  WBC 19.2* 13.1* 11.5* 12.0*  NEUTROABS 17.6* 11.6* 10.3* 10.5*  HGB 13.5 12.5 11.9* 12.2  HCT 38.1 35.1* 33.3* 34.8*  MCV 84.7 84.4 84.5 85.5  PLT 395 329 325 496   Basic Metabolic Panel: Recent Labs  Lab 03/29/21 0536 03/31/21 0628 04/03/21 0644  NA 138 136 137  K 3.7 3.5 4.0  CL 95* 100 99  CO2 '26 26 30  '$ GLUCOSE 96 114* 117*  BUN 23 30* 19  CREATININE 0.92 0.88 0.83  CALCIUM 8.7* 8.5* 8.6*   GFR: Estimated Creatinine Clearance: 26.5 mL/min (by C-G formula based on SCr of 0.83 mg/dL). Liver Function Tests: No results for input(s): AST, ALT, ALKPHOS, BILITOT, PROT, ALBUMIN in the last 168 hours. No results for input(s): LIPASE, AMYLASE in the last 168 hours. No results for input(s): AMMONIA in the last 168 hours. Coagulation Profile: No  results for input(s): INR, PROTIME in the last 168 hours. Cardiac Enzymes: Recent Labs  Lab 03/29/21 0536  CKTOTAL 659*   BNP (last 3 results) No results for input(s): PROBNP in the last 8760 hours. HbA1C: No results for input(s): HGBA1C in the last 72 hours. CBG: Recent Labs  Lab 03/29/21 0935  GLUCAP 105*   Lipid Profile: No results for input(s): CHOL, HDL, LDLCALC, TRIG, CHOLHDL, LDLDIRECT in the last 72 hours. Thyroid Function Tests: No results for input(s): TSH, T4TOTAL, FREET4, T3FREE, THYROIDAB in the last 72 hours. Anemia Panel: No results for input(s): VITAMINB12, FOLATE, FERRITIN, TIBC, IRON, RETICCTPCT in the last 72 hours. Sepsis Labs: No results for input(s): PROCALCITON, LATICACIDVEN in the last 168 hours.   Recent Results (from the past 240 hour(s))  Resp Panel by RT-PCR (Flu A&B, Covid) Nasopharyngeal Swab     Status: Abnormal   Collection Time: 03/26/21 10:36 PM   Specimen: Nasopharyngeal Swab; Nasopharyngeal(NP) swabs  in vial transport medium  Result Value Ref Range Status   SARS Coronavirus 2 by RT PCR POSITIVE (A) NEGATIVE Final    Comment: (NOTE) SARS-CoV-2 target nucleic acids are DETECTED.  The SARS-CoV-2 RNA is generally detectable in upper respiratory specimens during the acute phase of infection. Positive results are indicative of the presence of the identified virus, but do not rule out bacterial infection or co-infection with other pathogens not detected by the test. Clinical correlation with patient history and other diagnostic information is necessary to determine patient infection status. The expected result is Negative.  Fact Sheet for Patients: EntrepreneurPulse.com.au  Fact Sheet for Healthcare Providers: IncredibleEmployment.be  This test is not yet approved or cleared by the [place] FDA and  has been authorized for detection and/or diagnosis of SARS-CoV-2 by FDA under an Emergency Use  Authorization (EUA).  This EUA will remain in effect (meaning this test can be used) for the duration of  the COVID-19 declaration under Section 564(b)(1) of the A ct, 21 U.S.C. section 360bbb-3(b)(1), unless the authorization is terminated or revoked sooner.     Influenza A by PCR NEGATIVE NEGATIVE Final   Influenza B by PCR NEGATIVE NEGATIVE Final    Comment: (NOTE) The Xpert Xpress SARS-CoV-2/FLU/RSV plus assay is intended as an aid in the diagnosis of influenza from Nasopharyngeal swab specimens and should not be used as a sole basis for treatment. Nasal washings and aspirates are unacceptable for Xpert Xpress SARS-CoV-2/FLU/RSV testing.  Fact Sheet for Patients: EntrepreneurPulse.com.au  Fact Sheet for Healthcare Providers: IncredibleEmployment.be  This test is not yet approved or cleared by the [place] FDA and has been authorized for detection and/or diagnosis of SARS-CoV-2 by FDA under an Emergency Use Authorization (EUA). This EUA will remain in effect (meaning this test can be used) for the duration of the COVID-19 declaration under Section 564(b)(1) of the Act, 21 U.S.C. section 360bbb-3(b)(1), unless the authorization is terminated or revoked.  Performed at [facility], [address] [place], [place] [address]   Blood Culture (routine x 2)     Status: None   Collection Time: 03/26/21 11:08 PM   Specimen: Left Antecubital; Blood  Result Value Ref Range Status   Specimen Description LEFT ANTECUBITAL  Final   Special Requests   Final    BOTTLES DRAWN AEROBIC AND ANAEROBIC Blood Culture adequate volume   Culture   Final    NO GROWTH 5 DAYS Performed at [facility], [address], [place], [place] [address]    Report Status 04/01/2021 FINAL  Final  Urine Culture     Status: Abnormal   Collection Time: 03/26/21 11:08 PM   Specimen: In/Out Cath Urine  Result Value Ref Range Status   Specimen  Description   Final    IN/OUT CATH URINE Performed at [facility], [address], [place], [place] [address]    Special Requests   Final    NONE Performed at [facility], [place], [place] [address]    Culture 10,000 COLONIES/mL ESCHERICHIA COLI (A)  Final   Report Status 03/29/2021 FINAL  Final   Organism ID, Bacteria ESCHERICHIA COLI (A)  Final      Susceptibility   Escherichia coli - MIC*    AMPICILLIN <=2 SENSITIVE Sensitive     CEFAZOLIN <=4 SENSITIVE Sensitive     CEFEPIME <=0.12 SENSITIVE Sensitive     CEFTRIAXONE <=0.25 SENSITIVE Sensitive     CIPROFLOXACIN <=0.25 SENSITIVE Sensitive  GENTAMICIN <=1 SENSITIVE Sensitive     IMIPENEM <=0.25 SENSITIVE Sensitive     NITROFURANTOIN <=16 SENSITIVE Sensitive     TRIMETH/SULFA <=20 SENSITIVE Sensitive     AMPICILLIN/SULBACTAM <=2 SENSITIVE Sensitive     PIP/TAZO <=4 SENSITIVE Sensitive     * 10,000 COLONIES/mL ESCHERICHIA COLI  Blood Culture (routine x 2)     Status: None   Collection Time: 03/26/21 11:09 PM   Specimen: Right Antecubital; Blood  Result Value Ref Range Status   Specimen Description RIGHT ANTECUBITAL  Final   Special Requests   Final    BOTTLES DRAWN AEROBIC AND ANAEROBIC Blood Culture results may not be optimal due to an inadequate volume of blood received in culture bottles   Culture   Final    NO GROWTH 5 DAYS Performed at [facility], [address] [place], [place] [address]    Report Status 04/01/2021 FINAL  Final    Antimicrobials: Anti-infectives (From admission, onward)    Start     Dose/Rate Route Frequency Ordered Stop   03/30/21 1000  azithromycin (ZITHROMAX) tablet 500 mg        500 mg Oral Daily 03/29/21 1043 03/31/21 1030   03/30/21 1000  cefdinir (OMNICEF) capsule 300 mg        300 mg Oral Daily 03/29/21 1043 03/31/21 1027   03/28/21 2200  nirmatrelvir/ritonavir EUA (PAXLOVID) 3 tablet        3 tablet Oral 2 times daily 03/28/21 1501  04/01/21 0959   03/27/21 0115  nirmatrelvir/ritonavir EUA (renal dosing) (PAXLOVID) 2 tablet  Status:  Discontinued        2 tablet Oral 2 times daily 03/27/21 0106 03/28/21 1501   03/26/21 2300  cefTRIAXone (ROCEPHIN) 2 g in sodium chloride 0.9 % 100 mL IVPB  Status:  Discontinued        2 g 200 mL/hr over 30 Minutes Intravenous Every 24 hours 03/26/21 2258 03/29/21 1043   03/26/21 2300  azithromycin (ZITHROMAX) 500 mg in sodium chloride 0.9 % 250 mL IVPB  Status:  Discontinued        500 mg 250 mL/hr over 60 Minutes Intravenous Every 24 hours 03/26/21 2258 03/29/21 1043      Culture/Microbiology    Component Value Date/Time   SDES RIGHT ANTECUBITAL 03/26/2021 2309   SPECREQUEST  03/26/2021 2309    BOTTLES DRAWN AEROBIC AND ANAEROBIC Blood Culture results may not be optimal due to an inadequate volume of blood received in culture bottles   CULT  03/26/2021 2309    NO GROWTH 5 DAYS Performed at [facility], [address] [place], [place] [address]    REPTSTATUS 04/01/2021 FINAL 03/26/2021 2309    Other culture-see note  Radiology Studies: No results found.   LOS: 8 days   Kayleen Memos, MD [facility]  04/04/2021, 2:29 PM

## 2021-04-04 NOTE — Progress Notes (Signed)
Physical Therapy Treatment ?Patient Details ?Name: Lisa Martin ?MRN: [id number] ?DOB: 11-07-1931 ?Today's Date: 04/04/2021 ? ? ?History of Present Illness Pt is an 88 y/o F w/ PMH: advanced dementia, hypothyroidism, hypertension, recent kpyohplasty at T7 on 1/30, compression fx to T8 on XR on 2/14 w/ plan for upcoming kyphoplasty who presented to ED via EMS d/t AMS and was found to be COVID +. Adm for sepsis r/t PNA. ? ?  ?PT Comments  ? ? Pt resting in chair upon arrival, sitter in room. Pt with c/o fatigue, yet willing to work with PT.  Pt required supervision and increased time to stand from chair. Increased fall risk without B UE support, pt completed ~40f of gait training in room with RW and close CGA. Pt fatigued, stated she didn't feel right and had to cut ambulation distance short. No c/o dizziness, HR 82bpm.  Pt left resting in recliner. Will continue to monitor activity tolerance. ?  ?Recommendations for follow up therapy are one component of a multi-disciplinary discharge planning process, led by the attending physician.  Recommendations may be updated based on patient status, additional functional criteria and insurance authorization. ? ?Follow Up Recommendations ? Skilled nursing-short term rehab (<3 hours/day) ?  ?  ?Assistance Recommended at Discharge Frequent or constant Supervision/Assistance  ?Patient can return home with the following A little help with walking and/or transfers;A little help with bathing/dressing/bathroom;Help with stairs or ramp for entrance;Direct supervision/assist for medications management ?  ?Equipment Recommendations ? Rolling walker (2 wheels)  ?  ?Recommendations for Other Services   ? ? ?  ?Precautions / Restrictions Precautions ?Precautions: Fall  ?  ? ?Mobility ? Bed Mobility ?  ?  ?  ?  ?  ?  ?  ?  ?  ? ?Transfers ?Overall transfer level: Needs assistance ?Equipment used: Rolling walker (2 wheels) ?Transfers: Sit to/from Stand ?Sit to Stand: Supervision ?  ?  ?  ?  ?   ?General transfer comment:  (Increased time to complete task) ?  ? ?Ambulation/Gait ?Ambulation/Gait assistance: Min guard, Min assist ?Gait Distance (Feet): 25 Feet ?Assistive device: Rolling walker (2 wheels) ?Gait Pattern/deviations: Decreased step length - right, Decreased step length - left, Trunk flexed ?Gait velocity: decreased ?  ?  ?General Gait Details:  (Increased fatigue with mobility) ? ? ?Stairs ?  ?  ?  ?  ?  ? ? ?Wheelchair Mobility ?  ? ?Modified Rankin (Stroke Patients Only) ?  ? ? ?  ?Balance Overall balance assessment: Needs assistance ?Sitting-balance support: Feet supported ?Sitting balance-Leahy Scale: Good ?  ?  ?Standing balance support: Bilateral upper extremity supported ?Standing balance-Leahy Scale: Fair ?  ?  ?  ?  ?  ?  ?  ?  ?  ?  ?  ?  ?  ? ?  ?Cognition Arousal/Alertness: Awake/alert ?Behavior During Therapy: [facility] ?Overall Cognitive Status: Impaired/Different from baseline ?Area of Impairment: Safety/judgement, Problem solving ?  ?  ?  ?  ?  ?  ?  ?  ?Orientation Level: Disoriented to, Time, Situation ?  ?Memory: Decreased short-term memory ?Following Commands: Follows one step commands inconsistently, Follows one step commands with increased time ?Safety/Judgement: Decreased awareness of safety, Decreased awareness of deficits ?Awareness: Intellectual ?Problem Solving: Requires verbal cues, Requires tactile cues ?General Comments: Pt has baseline cognition deficits. cognition greatly impacts session but overall pt was cooperativeand able to follow simple commands throughout ?  ?  ? ?  ?Exercises   ? ?  ?  General Comments   ?  ?  ? ?Pertinent Vitals/Pain Pain Assessment ?Pain Assessment: No/denies pain  ? ? ?Home Living   ?  ?  ?  ?  ?  ?  ?  ?  ?  ?   ?  ?Prior Function    ?  ?  ?   ? ?PT Goals (current goals can now be found in the care plan section) Acute Rehab PT Goals ?Patient Stated Goal: none stated ? ?  ?Frequency ? ? ? Min 2X/week ? ? ? ?  ?PT  Plan Current plan remains appropriate  ? ? ?Co-evaluation   ?  ?  ?  ?  ? ?  ?AM-PAC PT "6 Clicks" Mobility   ?Outcome Measure ? Help needed turning from your back to your side while in a flat bed without using bedrails?: A Little ?Help needed moving from lying on your back to sitting on the side of a flat bed without using bedrails?: A Little ?Help needed moving to and from a bed to a chair (including a wheelchair)?: A Little ?Help needed standing up from a chair using your arms (e.g., wheelchair or bedside chair)?: A Little ?Help needed to walk in hospital room?: A Little ?Help needed climbing 3-5 steps with a railing? : A Little ?6 Click Score: 18 ? ?  ?End of Session   ?  ?  ?  ?PT Visit Diagnosis: Unsteadiness on feet (R26.81);Other abnormalities of gait and mobility (R26.89);Muscle weakness (generalized) (M62.81);Difficulty in walking, not elsewhere classified (R26.2) ?  ? ? ?Time: 1310-1325 ?PT Time Calculation (min) (ACUTE ONLY): 15 min ? ?Charges:  $Gait Training: 8-22 mins          ?          ?Mikel Cella, PTA ? ? ? ?Lisa Martin ?04/04/2021, 2:32 PM ? ?

## 2021-04-05 DIAGNOSIS — E43 Unspecified severe protein-calorie malnutrition: Secondary | ICD-10-CM

## 2021-04-05 DIAGNOSIS — R21 Rash and other nonspecific skin eruption: Secondary | ICD-10-CM

## 2021-04-05 DIAGNOSIS — S22000A Wedge compression fracture of unspecified thoracic vertebra, initial encounter for closed fracture: Secondary | ICD-10-CM

## 2021-04-05 NOTE — TOC Progression Note (Signed)
Transition of Care (TOC) - Progression Note  ? ? ?Patient Details  ?Name: Lisa Martin ?MRN: [id number] ?Date of Birth: Aug 26, 1931 ? ?Transition of Care (TOC) CM/SW Contact  ?Pete Pelt, RN ?Phone Number: ?04/05/2021, 2:54 PM ? ?Clinical Narrative:   After much discussion about facilities, placement and options, patient's daughter would like patient to receive rehabilitation at [facility].  ? ?Neoma Laming from [facility] aware, auth requested from [employer]. ? ? ? ?Expected Discharge Plan: Memory Care ?Barriers to Discharge: Continued Medical Work up ? ?Expected Discharge Plan and Services ?Expected Discharge Plan: Memory Care ?  ?  ?Post Acute Care Choice: [place] ?Living arrangements for the past 2 months: [place] ?                ?  ?  ?  ?  ?  ?  ?  ?  ?  ?  ? ? ?Social Determinants of Health (SDOH) Interventions ?  ? ?Readmission Risk Interventions ?No flowsheet data found. ? ?

## 2021-04-05 NOTE — Progress Notes (Signed)
?Progress Note ? ? ?Patient: Lisa Martin IRC:[id number] DOB: 24-May-1931 DOA: 03/26/2021     9 ?DOS: the patient was seen and examined on 04/05/2021 ?  ?Brief hospital course: ?88 year old female with history of advanced dementia, hypothyroidism, hypertension who was brought to home to the emergency department for the evaluation of altered mental status.  She was also having nonproductive cough, developed maculopapular rash on her chest and abdomen.  Patient recently had a T7 kyphoplasty on 03/01/2021 and was planned for another T8 kyphoplasty on 04/02/2021.  On presentation she was hemodynamically stable.  COVID screening test came out to be positive.  Chest x-ray showed left lower lobe infiltrate consistent with pneumonia.  CT head did not show any acute intracranial process.  Patient was admitted and treated for pneumonia, overall doing well remains on room air seen by PT OT has history skilled nursing facility and family interested on memory care placement.   ? ?Due to patient's COVID remains on isolation until 04/06/2021 when she will be ready for discharge to skilled nursing facility. ? ?3/6: Patient remained stable and ready for discharge tomorrow-pending insurance authorization. ?No need for sitter. ? ? ?Assessment and Plan: ?* Acute metabolic encephalopathy ?Resolved.  Appears to be at baseline. ?Most likely multifactorial with concern of pneumonia, COVID-19, UTI and dementia. ?PT/OT is recommending rehab.  Daughter is interested in memory care unit. ?-Had a bed offer-pending insurance authorization ?-No need for sitter ? ?Protein-calorie malnutrition, severe ([place]) ?Estimated body mass index is 15.11 kg/m? as calculated from the following: ?  Height as of this encounter: 5' (1.524 m). ?  Weight as of this encounter: 35.1 kg.  ?-Continue with supplements ? ?Compression fracture of body of thoracic vertebra (HCC) ?Patient has compression fracture. ?She underwent T7 kyphoplasty on 03/01/2021. ?T8 kyphoplasty was?planned  on 04/02/2021,?IR aware and they have postponed it. ? ?Rash ? Patient had some unspecified rash on admission which has been resolved with hydrocortisone cream. ? ?Acquired hypothyroidism ?- Continue with Synthroid ? ?Hypertension ?Blood pressure within goal. ?-Continue Cardizem and lisinopril ? ?Acute prerenal azotemia ?Resolved. ? ?Incidental finding of COVID-19 virus infection ?Patient completed the course of Paxlovid. ?Will complete quarantine today. ?Remained on room air ? ?Community acquired pneumonia ?See above ? ?Severe sepsis ([place]) ?Patient met sepsis criteria on admission, secondary to pneumonia and E. coli UTI. ?Completed the course of antibiotics.  Chest x-ray with left lower lobe infiltrate. ?Stable and waiting for insurance authorization so she can go to a memory care unit. ? ?Subjective: Patient was seen and examined today.  No complaints.  She was oriented to self only and quite pleasant old lady. ? ?Physical Exam: ?Vitals:  ? 04/04/21 1103 04/04/21 1616 04/04/21 1915 04/05/21 0939  ?BP: 134/71 115/68 (!) 117/54 120/64  ?Pulse: 65 68 (!) 56 62  ?Resp: _0 ?Temp: 98 ?F (36.7 ?C) 98.4 ?F (36.9 ?C) 97.8 ?F (36.6 ?C) 98.7 ?F (37.1 ?C)  ?TempSrc:      ?SpO2: 100% 97% 99% 96%  ?Weight:      ?Height:      ? ?General.  Malnourished elderly lady, in no acute distress. ?Pulmonary.  Lungs clear bilaterally, normal respiratory effort. ?CV.  Regular rate and rhythm, no JVD, rub or murmur. ?Abdomen.  Soft, nontender, nondistended, BS positive. ?CNS.  Alert and oriented to self only.  No focal neurologic deficit. ?Extremities.  No edema, no cyanosis, pulses intact and symmetrical. ?Psychiatry.  Judgment and insight appears impaired. ?Data Reviewed: ?Prior notes, labs and  images reviewed. ? ?Family Communication: Daughter was updated ? ?Disposition: ?Status is: Inpatient ?Remains inpatient appropriate because: Waiting to complete quarantine, Before going to rehab. ?Should be able to leave tomorrow pending  insurance authorization ? ? Planned Discharge Destination: Skilled nursing facility ? ?DVT prophylaxis.  Lovenox ? ?Time spent: 40 minutes ? ?This record has been created using Systems analyst. Errors have been sought and corrected,but may not always be located. Such creation errors do not reflect on the standard of care. ? ?Author: ?Lorella Nimrod, MD ?04/05/2021 4:20 PM ? ?For on call review [email or web address].  ?

## 2021-04-05 NOTE — Assessment & Plan Note (Signed)
Patient had some unspecified rash on admission which has been resolved with hydrocortisone cream. ?

## 2021-04-05 NOTE — Assessment & Plan Note (Signed)
Estimated body mass index is 15.11 kg/m? as calculated from the following: ?  Height as of this encounter: 5' (1.524 m). ?  Weight as of this encounter: 35.1 kg.  ?-Continue with supplements ?

## 2021-04-05 NOTE — Assessment & Plan Note (Signed)
Patient met sepsis criteria on admission, secondary to pneumonia and E. coli UTI. ?Completed the course of antibiotics.  Chest x-ray with left lower lobe infiltrate. ?Stable and waiting for insurance authorization so she can go to a memory care unit. ?

## 2021-04-05 NOTE — Assessment & Plan Note (Signed)
Patient completed the course of Paxlovid. ?Will complete quarantine today. ?Remained on room air ?

## 2021-04-05 NOTE — Assessment & Plan Note (Signed)
See above

## 2021-04-05 NOTE — Assessment & Plan Note (Signed)
Patient has compression fracture. ?She underwent T7 kyphoplasty on 03/01/2021. ?T8 kyphoplasty was?planned on 04/02/2021,?IR aware and they have postponed it. ?

## 2021-04-05 NOTE — Assessment & Plan Note (Signed)
Resolved.  Appears to be at baseline. ?Most likely multifactorial with concern of pneumonia, COVID-19, UTI and dementia. ?PT/OT is recommending rehab.  Daughter is interested in memory care unit. ?-Had a bed offer-pending insurance authorization ?-No need for sitter ?

## 2021-04-05 NOTE — Assessment & Plan Note (Signed)
Resolved

## 2021-04-05 NOTE — Assessment & Plan Note (Signed)
Continue with Synthroid 

## 2021-04-05 NOTE — Assessment & Plan Note (Signed)
Blood pressure within goal. ?-Continue Cardizem and lisinopril ?

## 2021-04-06 DIAGNOSIS — A419 Sepsis, unspecified organism: Secondary | ICD-10-CM | POA: Diagnosis not present

## 2021-04-06 DIAGNOSIS — S22000D Wedge compression fracture of unspecified thoracic vertebra, subsequent encounter for fracture with routine healing: Secondary | ICD-10-CM | POA: Diagnosis not present

## 2021-04-06 DIAGNOSIS — M6281 Muscle weakness (generalized): Secondary | ICD-10-CM | POA: Diagnosis not present

## 2021-04-06 DIAGNOSIS — J189 Pneumonia, unspecified organism: Secondary | ICD-10-CM | POA: Diagnosis not present

## 2021-04-06 DIAGNOSIS — Z7401 Bed confinement status: Secondary | ICD-10-CM | POA: Diagnosis not present

## 2021-04-06 DIAGNOSIS — R2681 Unsteadiness on feet: Secondary | ICD-10-CM | POA: Diagnosis not present

## 2021-04-06 DIAGNOSIS — R2689 Other abnormalities of gait and mobility: Secondary | ICD-10-CM | POA: Diagnosis not present

## 2021-04-06 DIAGNOSIS — R279 Unspecified lack of coordination: Secondary | ICD-10-CM | POA: Diagnosis not present

## 2021-04-06 DIAGNOSIS — R0902 Hypoxemia: Secondary | ICD-10-CM | POA: Diagnosis not present

## 2021-04-06 DIAGNOSIS — I959 Hypotension, unspecified: Secondary | ICD-10-CM | POA: Diagnosis not present

## 2021-04-06 DIAGNOSIS — Z741 Need for assistance with personal care: Secondary | ICD-10-CM | POA: Diagnosis not present

## 2021-04-06 DIAGNOSIS — G9341 Metabolic encephalopathy: Secondary | ICD-10-CM | POA: Diagnosis not present

## 2021-04-06 MED ORDER — ALPRAZOLAM 0.25 MG PO TABS
0.2500 mg | ORAL_TABLET | Freq: Every evening | ORAL | 0 refills | Status: DC | PRN
Start: 2021-04-06 — End: 2022-06-01

## 2021-04-06 MED ORDER — OXYCODONE HCL 5 MG PO TABS: 5.0000 mg | ORAL_TABLET | Freq: Four times a day (QID) | ORAL | 0 refills | Status: DC | PRN

## 2021-04-06 MED ORDER — POLYETHYLENE GLYCOL 3350 17 G PO PACK: 17.0000 g | PACK | Freq: Every day | ORAL | 0 refills | Status: DC | PRN

## 2021-04-06 MED ORDER — SENNOSIDES-DOCUSATE SODIUM 8.6-50 MG PO TABS: 1.0000 | ORAL_TABLET | Freq: Two times a day (BID) | ORAL | Status: DC

## 2021-04-06 NOTE — Progress Notes (Signed)
Report was called to Haze Rushing, LPN, at [facility].  ?

## 2021-04-06 NOTE — Care Management Important Message (Signed)
Important Message ? ?Patient Details  ?Name: Lisa Martin ?MRN: [id number] ?Date of Birth: 26-Jan-1932 ? ? ?Medicare Important Message Given:  Yes ? ?I reviewed the Important Message from Medicare with the patient's daughter, Ileene Rubens by phone [phone]) and she is agreement with the discharge plan. I asked if she would like a copy but she said it wasn't necessary.  I thanked her for her time.  ? ? ?Juliann Pulse A Brendon Christoffel ?04/06/2021, 12:32 PM ?

## 2021-04-06 NOTE — Discharge Summary (Addendum)
Physician Discharge Summary   Patient: Lisa Martin MRN: [id number] DOB: 10-12-1931  Admit date:     03/26/2021  Discharge date: 04/06/21  Discharge Physician: Lorella Nimrod   PCP: Rusty Aus, MD   Recommendations at discharge:  Please obtain CBC and BMP in 1 week Follow-up with primary care provider in 1 week Follow-up with l/ed to be scheduled kyphoplasty.  Discharge Diagnoses: Principal Problem:   Acute metabolic encephalopathy Active Problems:   Severe sepsis ([place])   Community acquired pneumonia   Incidental finding of COVID-19 virus infection   Dehydration   Acute prerenal azotemia   Hypertension   GERD (gastroesophageal reflux disease)   Acquired hypothyroidism   Rash   Compression fracture of body of thoracic vertebra (HCC)   Protein-calorie malnutrition, severe [facility])   Hospital Course: 88 year old female with history of advanced dementia, hypothyroidism, hypertension who was brought to home to the emergency department for the evaluation of altered mental status.  She was also having nonproductive cough, developed maculopapular rash on her chest and abdomen.  Patient recently had a T7 kyphoplasty on 03/01/2021 and was planned for another T8 kyphoplasty on 04/02/2021.  On presentation she was hemodynamically stable.  COVID screening test came out to be positive.  Chest x-ray showed left lower lobe infiltrate consistent with pneumonia.  CT head did not show any acute intracranial process.  Patient was admitted and treated for pneumonia, overall doing well remains on room air seen by PT OT has history skilled nursing facility and family interested on memory care placement.    Due to patient's COVID remains on isolation until 04/06/2021 when she will be ready for discharge to skilled nursing facility.  Patient remained stable, pleasantly confused, oriented to self only.  No sign of any agitation or need for sitter noted.  She is being discharged to rehab as recommended by our  PT. She might need care at the memory care unit which can be determined at rehab.  Patient will continue with her home medications and need to have a close follow-up with her providers for further recommendations.  Assessment and Plan: * Acute metabolic encephalopathy Resolved.  Appears to be at baseline. Most likely multifactorial with concern of pneumonia, COVID-19, UTI and dementia. PT/OT is recommending rehab.  Daughter is interested in memory care unit. -Had a bed offer-pending insurance authorization -No need for sitter  Protein-calorie malnutrition, severe ([place]) Estimated body mass index is 15.11 kg/m as calculated from the following:   Height as of this encounter: 5' (1.524 m).   Weight as of this encounter: 35.1 kg.  -Continue with supplements  Compression fracture of body of thoracic vertebra [facility]) Patient has compression fracture.  She underwent T7 kyphoplasty on 03/01/2021.  T8 kyphoplasty was planned on 04/02/2021, IR aware and they have postponed it.  Rash  Patient had some unspecified rash on admission which has been resolved with hydrocortisone cream.  Acquired hypothyroidism - Continue with Synthroid  Hypertension Blood pressure within goal. -Continue Cardizem and lisinopril  Acute prerenal azotemia Resolved.  Incidental finding of COVID-19 virus infection Patient completed the course of Paxlovid. Will complete quarantine today. Remained on room air  Community acquired pneumonia See above  Severe sepsis [facility]) Patient met sepsis criteria on admission, secondary to pneumonia and E. coli UTI. Completed the course of antibiotics.  Chest x-ray with left lower lobe infiltrate. Stable and waiting for insurance authorization so she can go to a memory care unit.    Pain control - [facility]  Controlled Substance Reporting System database was reviewed. and patient was instructed, not to drive, operate heavy machinery, perform activities at heights, swimming  or participation in water activities or provide baby-sitting services while on Pain, Sleep and Anxiety Medications; until their outpatient Physician has advised to do so again. Also recommended to not to take more than prescribed Pain, Sleep and Anxiety Medications.   Consultants: None Procedures performed: None Disposition: Skilled nursing facility Diet recommendation:  Regular diet  DISCHARGE MEDICATION: Allergies as of 04/06/2021   Not on File      Medication List     STOP taking these medications    fluticasone 50 MCG/ACT nasal spray Commonly known as: FLONASE   HYDROcodone-acetaminophen 5-325 MG tablet Commonly known as: NORCO/VICODIN   hydrOXYzine 25 MG tablet Commonly known as: ATARAX   lidocaine 5 % Commonly known as: Lidoderm   potassium chloride 10 MEQ tablet Commonly known as: KLOR-CON M       TAKE these medications    acetaminophen-codeine 300-30 MG tablet Commonly known as: TYLENOL #3 Take 1 tablet by mouth 2 (two) times daily as needed.   ALPRAZolam 0.25 MG tablet Commonly known as: XANAX Take 0.25 mg by mouth at bedtime as needed for anxiety.   Dilt-XR 120 MG 24 hr capsule Generic drug: diltiazem Take 120 mg by mouth daily.   gabapentin 100 MG capsule Commonly known as: NEURONTIN Take 100 mg by mouth 3 (three) times daily.   levothyroxine 88 MCG tablet Commonly known as: SYNTHROID Take 88 mcg by mouth daily.   memantine 5 MG tablet Commonly known as: NAMENDA Take 5 mg by mouth 2 (two) times daily.   omeprazole 20 MG capsule Commonly known as: PRILOSEC Take 20 mg by mouth daily.   oxyCODONE 5 MG immediate release tablet Commonly known as: Oxy IR/ROXICODONE Take 1 tablet (5 mg total) by mouth every 6 (six) hours as needed for moderate pain, severe pain or breakthrough pain. What changed:  when to take this reasons to take this   polyethylene glycol 17 g packet Commonly known as: MIRALAX / GLYCOLAX Take 17 g by mouth daily as  needed for mild constipation or moderate constipation.   rivastigmine 3 MG capsule Commonly known as: EXELON Take 3 mg by mouth 2 (two) times daily.   senna-docusate 8.6-50 MG tablet Commonly known as: Senokot-S Take 1 tablet by mouth 2 (two) times daily.   sertraline 25 MG tablet Commonly known as: ZOLOFT Take 25 mg by mouth daily.   vitamin B-12 250 MCG tablet Commonly known as: CYANOCOBALAMIN Take 1,000 mcg by mouth daily.        Follow-up Information     Rusty Aus, MD. Schedule an appointment as soon as possible for a visit in 1 week(s).   Specialty: Internal Medicine Contact information: [facility] [place] [address] [phone]                Discharge Exam: Danley Danker Weights   04/02/21 0500 04/03/21 0500 04/06/21 0500  Weight: 38.4 kg 35.1 kg 38.4 kg   General.  Frail elderly lady, in no acute distress. Pulmonary.  Lungs clear bilaterally, normal respiratory effort. CV.  Regular rate and rhythm, no JVD, rub or murmur. Abdomen.  Soft, nontender, nondistended, BS positive. CNS.  Alert and oriented to self only.  No focal neurologic deficit. Extremities.  No edema, no cyanosis, pulses intact and symmetrical. Psychiatry.  Judgment and insight appears impaired.   Condition at discharge: stable  The results of significant diagnostics from this hospitalization (including imaging, microbiology, ancillary and laboratory) are listed below for reference.   Imaging Studies: DG Chest 2 View  Result Date: 03/26/2021 CLINICAL DATA:  Tachypnea. EXAM: CHEST - 2 VIEW COMPARISON:  10/21/2015 FINDINGS: Patient has taken a poor inspiration. There is newly seen infiltrate in the lower lungs, best seen on the lateral view, probably present in the left lower. No visible effusion. Old traumatic deformity of the left ribs. Old corpectomy and fusion in the lower thoracic spine. Old augmented fracture in the midthoracic spine. Un augmented  fracture medially inferior to that. This appears to have worsened compared to thoracic radiography of 2 weeks ago. IMPRESSION: Basilar pneumonia, probably on the left. This is best seen on the lateral view. Further progression of collapse of a thoracic vertebral body, presumably T8, immediately below the previously augmented level. This is compared to study of just 14 days ago. Electronically Signed   By: Nelson Chimes M.D.   On: 03/26/2021 22:40   DG Thoracic Spine 2 View  Result Date: 03/10/2021 CLINICAL DATA:  Closed wedge compression fracture of T7 vertebra, initial encounter ([place]) EXAM: THORACIC SPINE 2 VIEWS COMPARISON:  Multiple priors FINDINGS: Patient is status post T7 vertebral body kyphoplasty. A small amount of cement appears to be within the T7-T8 disc space, within expected limits. No abnormal anterior or posterior extent of the vertebral body cement. No new compression fracture identified. Generalized osteopenia. Stable changes of previous corpectomy and fusion spanning T11-L1. IVC filter is present. IMPRESSION: Stable postsurgical changes of T7 kyphoplasty without complicating feature. Other chronic findings as described. Electronically Signed   By: Albin Felling M.D.   On: 03/10/2021 15:58   CT HEAD WO CONTRAST (5MM)  Result Date: 03/26/2021 CLINICAL DATA:  Mental status changes of unknown cause. Generalized weakness. EXAM: CT HEAD WITHOUT CONTRAST TECHNIQUE: Contiguous axial images were obtained from the base of the skull through the vertex without intravenous contrast. RADIATION DOSE REDUCTION: This exam was performed according to the departmental dose-optimization program which includes automated exposure control, adjustment of the mA and/or kV according to patient size and/or use of iterative reconstruction technique. COMPARISON:  06/24/2020 FINDINGS: Brain: Generalized atrophy. Possible temporal lobe predominance. Chronic small-vessel ischemic changes of the cerebral hemispheric white  matter. No evidence of acute infarction, mass lesion, hemorrhage, hydrocephalus or extra-axial collection. Vascular: There is atherosclerotic calcification of the major vessels at the base of the brain. Skull: Negative Sinuses/Orbits: Clear/normal Other: None IMPRESSION: No acute CT finding. Brain atrophy, possibly with temporal lobe predominance. Electronically Signed   By: Nelson Chimes M.D.   On: 03/26/2021 22:44   IR Radiologist Eval & Mgmt  Result Date: 03/23/2021 EXAM: ESTABLISHED PATIENT OFFICE VISIT CHIEF COMPLAINT: Refer to EMR HISTORY OF PRESENT ILLNESS: Refer to EMR REVIEW OF SYSTEMS: Refer to EMR PHYSICAL EXAMINATION: Refer to EMR ASSESSMENT AND PLAN: Refer to EMR Electronically Signed   By: Albin Felling M.D.   On: 03/23/2021 16:09    Microbiology: Results for orders placed or performed during the hospital encounter of 03/26/21  Resp Panel by RT-PCR (Flu A&B, Covid) Nasopharyngeal Swab     Status: Abnormal   Collection Time: 03/26/21 10:36 PM   Specimen: Nasopharyngeal Swab; Nasopharyngeal(NP) swabs in vial transport medium  Result Value Ref Range Status   SARS Coronavirus 2 by RT PCR POSITIVE (A) NEGATIVE Final    Comment: (NOTE) SARS-CoV-2 target nucleic acids are DETECTED.  The SARS-CoV-2 RNA is generally detectable in upper respiratory  specimens during the acute phase of infection. Positive results are indicative of the presence of the identified virus, but do not rule out bacterial infection or co-infection with other pathogens not detected by the test. Clinical correlation with patient history and other diagnostic information is necessary to determine patient infection status. The expected result is Negative.  Fact Sheet for Patients: EntrepreneurPulse.com.au  Fact Sheet for Healthcare Providers: IncredibleEmployment.be  This test is not yet approved or cleared by the [place] FDA and  has been authorized for detection and/or  diagnosis of SARS-CoV-2 by FDA under an Emergency Use Authorization (EUA).  This EUA will remain in effect (meaning this test can be used) for the duration of  the COVID-19 declaration under Section 564(b)(1) of the A ct, 21 U.S.C. section 360bbb-3(b)(1), unless the authorization is terminated or revoked sooner.     Influenza A by PCR NEGATIVE NEGATIVE Final   Influenza B by PCR NEGATIVE NEGATIVE Final    Comment: (NOTE) The Xpert Xpress SARS-CoV-2/FLU/RSV plus assay is intended as an aid in the diagnosis of influenza from Nasopharyngeal swab specimens and should not be used as a sole basis for treatment. Nasal washings and aspirates are unacceptable for Xpert Xpress SARS-CoV-2/FLU/RSV testing.  Fact Sheet for Patients: EntrepreneurPulse.com.au  Fact Sheet for Healthcare Providers: IncredibleEmployment.be  This test is not yet approved or cleared by the [place] FDA and has been authorized for detection and/or diagnosis of SARS-CoV-2 by FDA under an Emergency Use Authorization (EUA). This EUA will remain in effect (meaning this test can be used) for the duration of the COVID-19 declaration under Section 564(b)(1) of the Act, 21 U.S.C. section 360bbb-3(b)(1), unless the authorization is terminated or revoked.  Performed at [facility], [address] [place], [place] [address]   Blood Culture (routine x 2)     Status: None   Collection Time: 03/26/21 11:08 PM   Specimen: Left Antecubital; Blood  Result Value Ref Range Status   Specimen Description LEFT ANTECUBITAL  Final   Special Requests   Final    BOTTLES DRAWN AEROBIC AND ANAEROBIC Blood Culture adequate volume   Culture   Final    NO GROWTH 5 DAYS Performed at [facility], [address] [place], [place] [address]    Report Status 04/01/2021 FINAL  Final  Urine Culture     Status: Abnormal   Collection Time: 03/26/21 11:08 PM   Specimen: In/Out Cath  Urine  Result Value Ref Range Status   Specimen Description   Final    IN/OUT CATH URINE Performed at [facility], [address] [place], [place] [address]    Special Requests   Final    NONE Performed at [facility], [address] [place], [place] [address]    Culture 10,000 COLONIES/mL ESCHERICHIA COLI (A)  Final   Report Status 03/29/2021 FINAL  Final   Organism ID, Bacteria ESCHERICHIA COLI (A)  Final      Susceptibility   Escherichia coli - MIC*    AMPICILLIN <=2 SENSITIVE Sensitive     CEFAZOLIN <=4 SENSITIVE Sensitive     CEFEPIME <=0.12 SENSITIVE Sensitive     CEFTRIAXONE <=0.25 SENSITIVE Sensitive     CIPROFLOXACIN <=0.25 SENSITIVE Sensitive     GENTAMICIN <=1 SENSITIVE Sensitive     IMIPENEM <=0.25 SENSITIVE Sensitive     NITROFURANTOIN <=16 SENSITIVE Sensitive     TRIMETH/SULFA <=20 SENSITIVE Sensitive     AMPICILLIN/SULBACTAM <=2 SENSITIVE Sensitive     PIP/TAZO <=  4 SENSITIVE Sensitive     * 10,000 COLONIES/mL ESCHERICHIA COLI  Blood Culture (routine x 2)     Status: None   Collection Time: 03/26/21 11:09 PM   Specimen: Right Antecubital; Blood  Result Value Ref Range Status   Specimen Description RIGHT ANTECUBITAL  Final   Special Requests   Final    BOTTLES DRAWN AEROBIC AND ANAEROBIC Blood Culture results may not be optimal due to an inadequate volume of blood received in culture bottles   Culture   Final    NO GROWTH 5 DAYS Performed at [facility], [address] [place], [place] [address]    Report Status 04/01/2021 FINAL  Final    Labs: CBC: Recent Labs  Lab 03/31/21 0628 04/03/21 0644  WBC 11.5* 12.0*  NEUTROABS 10.3* 10.5*  HGB 11.9* 12.2  HCT 33.3* 34.8*  MCV 84.5 85.5  PLT 325 400   Basic Metabolic Panel: Recent Labs  Lab 03/31/21 0628 04/03/21 0644  NA 136 137  K 3.5 4.0  CL 100 99  CO2 26 30  GLUCOSE 114* 117*  BUN 30* 19  CREATININE 0.88 0.83  CALCIUM 8.5* 8.6*   Liver Function  Tests: No results for input(s): AST, ALT, ALKPHOS, BILITOT, PROT, ALBUMIN in the last 168 hours. CBG: No results for input(s): GLUCAP in the last 168 hours.  Discharge time spent: greater than 30 minutes.  This record has been created using Systems analyst. Errors have been sought and corrected,but may not always be located. Such creation errors do not reflect on the standard of care.   Signed: Lorella Nimrod, MD [facility] 04/06/2021

## 2021-04-06 NOTE — TOC Progression Note (Addendum)
Transition of Care (TOC) - Progression Note  ? ? ?Patient Details  ?Name: Lisa Martin ?MRN: [id number] ?Date of Birth: 29-Nov-1931 ? ?Transition of Care (TOC) CM/SW Contact  ?Pete Pelt, RN ?Phone Number: ?04/06/2021, 12:48 PM ? ?Clinical Narrative:   Patient has approval from [employer].  Can go to [facility] today, room 205b as per Alphia Kava..  Daughter aware,  [facility] to transport ? ?Addendum 1249 hours SNF approved for 7 days auth # [id number], EMS approved auth [id number] ? ?Expected Discharge Plan: Memory Care ?Barriers to Discharge: Continued Medical Work up ? ?Expected Discharge Plan and Services ?Expected Discharge Plan: Memory Care ?  ?  ?Post Acute Care Choice: [place] ?Living arrangements for the past 2 months: [place] ?                ?  ?  ?  ?  ?  ?  ?  ?  ?  ?  ? ? ?Social Determinants of Health (SDOH) Interventions ?  ? ?Readmission Risk Interventions ?No flowsheet data found. ? ?

## 2021-04-06 NOTE — Progress Notes (Signed)
PT Cancellation Note ? ?Patient Details ?Name: Lisa Martin ?MRN: [id number] ?DOB: 12-24-1931 ? ? ?Cancelled Treatment:    Reason Eval/Treat Not Completed: Other (comment).  Chart reviewed and attempted to see pt.  Pt refusing as she is expecting to be d/c today.  Pt will be seen at later date/time as medically appropriate and if pt still at hospital. ? ? ?Gwenlyn Saran, PT, DPT ?04/06/21, 1:14 PM ? ?

## 2021-04-08 LAB — METHYLMALONIC ACID, SERUM: Methylmalonic Acid, Quantitative: 87 nmol/L (ref 0–378)

## 2021-05-03 DIAGNOSIS — R2689 Other abnormalities of gait and mobility: Secondary | ICD-10-CM | POA: Diagnosis not present

## 2021-05-03 DIAGNOSIS — M6281 Muscle weakness (generalized): Secondary | ICD-10-CM | POA: Diagnosis not present

## 2021-05-03 DIAGNOSIS — Z741 Need for assistance with personal care: Secondary | ICD-10-CM | POA: Diagnosis not present

## 2021-05-03 DIAGNOSIS — R2681 Unsteadiness on feet: Secondary | ICD-10-CM | POA: Diagnosis not present

## 2021-05-03 DIAGNOSIS — R279 Unspecified lack of coordination: Secondary | ICD-10-CM | POA: Diagnosis not present

## 2021-05-21 DIAGNOSIS — I10 Essential (primary) hypertension: Secondary | ICD-10-CM | POA: Diagnosis not present

## 2021-05-21 DIAGNOSIS — E0590 Thyrotoxicosis, unspecified without thyrotoxic crisis or storm: Secondary | ICD-10-CM | POA: Diagnosis not present

## 2021-07-26 DIAGNOSIS — M1712 Unilateral primary osteoarthritis, left knee: Secondary | ICD-10-CM | POA: Diagnosis not present

## 2021-09-06 DIAGNOSIS — L603 Nail dystrophy: Secondary | ICD-10-CM | POA: Diagnosis not present

## 2021-09-06 DIAGNOSIS — I739 Peripheral vascular disease, unspecified: Secondary | ICD-10-CM | POA: Diagnosis not present

## 2021-09-22 DIAGNOSIS — Z961 Presence of intraocular lens: Secondary | ICD-10-CM | POA: Diagnosis not present

## 2021-09-22 DIAGNOSIS — H50112 Monocular exotropia, left eye: Secondary | ICD-10-CM | POA: Diagnosis not present

## 2021-09-22 DIAGNOSIS — H5442A5 Blindness left eye category 5, normal vision right eye: Secondary | ICD-10-CM | POA: Diagnosis not present

## 2021-09-22 DIAGNOSIS — H2522 Age-related cataract, morgagnian type, left eye: Secondary | ICD-10-CM | POA: Diagnosis not present

## 2021-11-02 DIAGNOSIS — M6281 Muscle weakness (generalized): Secondary | ICD-10-CM | POA: Diagnosis not present

## 2021-11-02 DIAGNOSIS — R2689 Other abnormalities of gait and mobility: Secondary | ICD-10-CM | POA: Diagnosis not present

## 2021-11-06 DIAGNOSIS — E559 Vitamin D deficiency, unspecified: Secondary | ICD-10-CM | POA: Diagnosis not present

## 2021-11-06 DIAGNOSIS — I10 Essential (primary) hypertension: Secondary | ICD-10-CM | POA: Diagnosis not present

## 2021-11-11 DIAGNOSIS — L603 Nail dystrophy: Secondary | ICD-10-CM | POA: Diagnosis not present

## 2021-11-11 DIAGNOSIS — I739 Peripheral vascular disease, unspecified: Secondary | ICD-10-CM | POA: Diagnosis not present

## 2021-12-01 DIAGNOSIS — M6281 Muscle weakness (generalized): Secondary | ICD-10-CM | POA: Diagnosis not present

## 2021-12-01 DIAGNOSIS — R2689 Other abnormalities of gait and mobility: Secondary | ICD-10-CM | POA: Diagnosis not present

## 2021-12-07 DIAGNOSIS — Z79899 Other long term (current) drug therapy: Secondary | ICD-10-CM | POA: Diagnosis not present

## 2021-12-07 DIAGNOSIS — E612 Magnesium deficiency: Secondary | ICD-10-CM | POA: Diagnosis not present

## 2021-12-07 DIAGNOSIS — Z794 Long term (current) use of insulin: Secondary | ICD-10-CM | POA: Diagnosis not present

## 2021-12-13 ENCOUNTER — Emergency Department: Payer: PPO

## 2021-12-13 ENCOUNTER — Other Ambulatory Visit: Payer: Self-pay

## 2021-12-13 ENCOUNTER — Emergency Department
Admission: EM | Admit: 2021-12-13 | Discharge: 2021-12-13 | Disposition: A | Discharge status: Skilled Nursing Facility | Payer: PPO | Attending: Emergency Medicine | Admitting: Emergency Medicine

## 2021-12-13 DIAGNOSIS — Y92002 Bathroom of unspecified non-institutional (private) residence single-family (private) house as the place of occurrence of the external cause: Secondary | ICD-10-CM | POA: Diagnosis not present

## 2021-12-13 DIAGNOSIS — S0101XA Laceration without foreign body of scalp, initial encounter: Secondary | ICD-10-CM | POA: Insufficient documentation

## 2021-12-13 DIAGNOSIS — S0990XA Unspecified injury of head, initial encounter: Secondary | ICD-10-CM

## 2021-12-13 DIAGNOSIS — Z043 Encounter for examination and observation following other accident: Secondary | ICD-10-CM | POA: Diagnosis not present

## 2021-12-13 DIAGNOSIS — W19XXXA Unspecified fall, initial encounter: Secondary | ICD-10-CM

## 2021-12-13 DIAGNOSIS — F0390 Unspecified dementia without behavioral disturbance: Secondary | ICD-10-CM | POA: Diagnosis not present

## 2021-12-13 DIAGNOSIS — S81012A Laceration without foreign body, left knee, initial encounter: Secondary | ICD-10-CM | POA: Insufficient documentation

## 2021-12-13 DIAGNOSIS — W1839XA Other fall on same level, initial encounter: Secondary | ICD-10-CM | POA: Insufficient documentation

## 2021-12-13 DIAGNOSIS — R9431 Abnormal electrocardiogram [ECG] [EKG]: Secondary | ICD-10-CM | POA: Diagnosis not present

## 2021-12-13 DIAGNOSIS — D72829 Elevated white blood cell count, unspecified: Secondary | ICD-10-CM | POA: Insufficient documentation

## 2021-12-13 DIAGNOSIS — M160 Bilateral primary osteoarthritis of hip: Secondary | ICD-10-CM | POA: Diagnosis not present

## 2021-12-13 DIAGNOSIS — R220 Localized swelling, mass and lump, head: Secondary | ICD-10-CM | POA: Diagnosis not present

## 2021-12-13 DIAGNOSIS — R55 Syncope and collapse: Secondary | ICD-10-CM | POA: Diagnosis not present

## 2021-12-13 DIAGNOSIS — N390 Urinary tract infection, site not specified: Secondary | ICD-10-CM | POA: Diagnosis not present

## 2021-12-13 DIAGNOSIS — S81812A Laceration without foreign body, left lower leg, initial encounter: Secondary | ICD-10-CM

## 2021-12-13 DIAGNOSIS — T1490XA Injury, unspecified, initial encounter: Secondary | ICD-10-CM | POA: Diagnosis not present

## 2021-12-13 LAB — CBC WITH DIFFERENTIAL/PLATELET
Abs Immature Granulocytes: 0.07 10*3/uL (ref 0.00–0.07)
Basophils Absolute: 0.1 10*3/uL (ref 0.0–0.1)
Basophils Relative: 1 %
Eosinophils Absolute: 0.1 10*3/uL (ref 0.0–0.5)
Eosinophils Relative: 1 %
HCT: 38.4 % (ref 36.0–46.0)
Hemoglobin: 13.4 g/dL (ref 12.0–15.0)
Immature Granulocytes: 1 %
Lymphocytes Relative: 9 %
Lymphs Abs: 1.2 10*3/uL (ref 0.7–4.0)
MCH: 29.1 pg (ref 26.0–34.0)
MCHC: 34.9 g/dL (ref 30.0–36.0)
MCV: 83.3 fL (ref 80.0–100.0)
Monocytes Absolute: 1 10*3/uL (ref 0.1–1.0)
Monocytes Relative: 7 %
Neutro Abs: 10.8 10*3/uL — ABNORMAL HIGH (ref 1.7–7.7)
Neutrophils Relative %: 81 %
Platelets: 244 10*3/uL (ref 150–400)
RBC: 4.61 MIL/uL (ref 3.87–5.11)
RDW: 15.4 % (ref 11.5–15.5)
WBC: 13.2 10*3/uL — ABNORMAL HIGH (ref 4.0–10.5)
nRBC: 0 % (ref 0.0–0.2)

## 2021-12-13 LAB — URINALYSIS, ROUTINE W REFLEX MICROSCOPIC
Bilirubin Urine: NEGATIVE
Glucose, UA: NEGATIVE mg/dL
Ketones, ur: NEGATIVE mg/dL
Nitrite: NEGATIVE
Protein, ur: 30 mg/dL — AB
Specific Gravity, Urine: 1.016 (ref 1.005–1.030)
WBC, UA: >50 WBC/hpf — ABNORMAL HIGH (ref 0–5)
pH: 5 (ref 5.0–8.0)

## 2021-12-13 LAB — BASIC METABOLIC PANEL
Anion gap: 9 (ref 5–15)
BUN: 22 mg/dL (ref 8–23)
CO2: 26 mmol/L (ref 22–32)
Calcium: 9.3 mg/dL (ref 8.9–10.3)
Chloride: 108 mmol/L (ref 98–111)
Creatinine, Ser: 0.92 mg/dL (ref 0.44–1.00)
GFR, Estimated: 60 mL/min — ABNORMAL LOW (ref >60)
Glucose, Bld: 134 mg/dL — ABNORMAL HIGH (ref 70–99)
Potassium: 3.6 mmol/L (ref 3.5–5.1)
Sodium: 143 mmol/L (ref 135–145)

## 2021-12-13 MED ORDER — CEFADROXIL 500 MG PO CAPS: 500.0000 mg | ORAL_CAPSULE | Freq: Two times a day (BID) | ORAL | 0 refills | Status: AC

## 2021-12-13 MED ORDER — ACETAMINOPHEN 500 MG PO TABS
1000.0000 mg | ORAL_TABLET | Freq: Once | ORAL | Status: AC
  Administered 2021-12-13: 1000 mg via ORAL
  Filled 2021-12-13: qty 2

## 2021-12-13 MED ORDER — CEPHALEXIN 500 MG PO CAPS
500.0000 mg | ORAL_CAPSULE | Freq: Once | ORAL | Status: AC
  Administered 2021-12-13: 500 mg via ORAL
  Filled 2021-12-13: qty 1

## 2021-12-13 NOTE — Discharge Instructions (Addendum)
You have been seen in the Emergency Department (ED) today for a fall.  Your work up does not show any concerning injuries.  Please take over-the-counter ibuprofen and/or Tylenol as needed for your pain (unless you have an allergy or your doctor as told you not to take them), or take any prescribed medication as instructed.  This includes the antibiotics to treat your urinary tract infection.  You have 2 staples in the laceration of your scalp.  These will need to be removed in about a week, either by your primary care doctor, a nurse at your facility, or at an urgent care.  You have Steri-Strips (tape) on the skin tear that is on your left leg.  Try to keep it dry for about 24 hours.  At that point it is okay to get it wet in the shower.  If the ends of the tape become frayed or start to peel off, please trim them and keep the tape across the wound as long as possible.  If it falls off on its own, that is okay, the wound will heal over time on its own.  Regular wound care is appropriate.  Please follow up with your doctor regarding today's Emergency Department (ED) visit and your recent fall.    Return to the ED if you have any headache, confusion, slurred speech, weakness/numbness of any arm or leg, or any increased pain.

## 2021-12-13 NOTE — ED Triage Notes (Signed)
Per EMS pt was found on the floor of the bathroom from an apparent mechanical fall. Pt has a head lac and a skin tear to the L knee area. Bleeding controlled at this time. Pt is aaox1 to herself. Pt sts it is July 1974 and she does not know where she is.

## 2021-12-13 NOTE — ED Provider Notes (Signed)
[facility] Provider Note    Event Date/Time   First MD Initiated Contact with Patient 12/13/21 [phone]     (approximate)   History   Fall and Head Laceration   HPI  Level 5 caveat:  history/ROS limited by chronic dementia  Lisa Martin is a 88 y.o. female with a history of dementia who lives at [facility].  She presents for evaluation after a fall.  The circumstances are unclear but reportedly she fell while ambulating on her own and then was found by staff again upright and walking around but with an injury to her head and to her left knee.  She says that she feels fine but her head is a little bit sore.  She denies neck pain.  She has had no chest pain or shortness of breath.  Her daughter is with her now and said that she has a history of urinary tract infections.  She is also little bit concerned that she may be overmedicated at the facility because she saw her earlier today and she seemed drowsy, but she is at her baseline at this time.     Physical Exam   Triage Vital Signs: ED Triage Vitals  Enc Vitals Group     BP 12/13/21 0051 131/76     Pulse Rate 12/13/21 0051 75     Resp 12/13/21 0051 16     Temp 12/13/21 0051 98.6 F (37 C)     Temp Source 12/13/21 0337 Oral     SpO2 12/13/21 0047 93 %     Weight 12/13/21 0052 40 kg (88 lb 2.9 oz)     Height 12/13/21 0052 1.524 m (5')     Head Circumference --      [place] Flow --      Pain Score 12/13/21 0051 5     Pain Loc --      Pain Edu? --      Excl. in [place]? --     Most recent vital signs: Vitals:   12/13/21 0051 12/13/21 0337  BP: 131/76 (!) 157/78  Pulse: 75 77  Resp: 16 14  Temp: 98.6 F (37 C) 98.6 F (37 C)  SpO2: 95% 100%     General: Awake, no distress.  Head:  Patient has a small sub-1 cm stellate laceration to her posterior scalp oozing a small amount of blood with a surrounding hematoma.  There is also another small abrasion above it but without any need for  closure. CV:  Good peripheral perfusion.  Normal heart sounds. Resp:  Normal effort.  Lungs clear to auscultation. Abd:  No distention.  No tenderness to palpation of the abdomen. Other:  The patient has no pain with active and passive range of motion of her arms and her legs including her hips and her knees.  She has a 3 cm relatively superficial skin tear to the left leg.  She had no tenderness to palpation of her pelvis.  She is awake and alert, oriented to herself and her daughter, otherwise uncertain of date and location, but very pleasant and conversant.  Her daughter is not concerned about her mental status at this time.   ED Results / Procedures / Treatments   Labs (all labs ordered are listed, but only abnormal results are displayed) Labs Reviewed  CBC WITH DIFFERENTIAL/PLATELET - Abnormal; Notable for the following components:      Result Value   WBC 13.2 (*)    Neutro Abs  10.8 (*)    All other components within normal limits  BASIC METABOLIC PANEL - Abnormal; Notable for the following components:   Glucose, Bld 134 (*)    GFR, Estimated 60 (*)    All other components within normal limits  URINALYSIS, ROUTINE W REFLEX MICROSCOPIC - Abnormal; Notable for the following components:   Color, Urine YELLOW (*)    APPearance CLOUDY (*)    Hgb urine dipstick SMALL (*)    Protein, ur 30 (*)    Leukocytes,Ua LARGE (*)    WBC, UA >50 (*)    Bacteria, UA RARE (*)    All other components within normal limits  URINE CULTURE     EKG  ED ECG REPORT I, Hinda Kehr, the attending physician, personally viewed and interpreted this ECG.  Date: 12/13/2021 EKG Time: 1:08 AM Rate: 58 Rhythm: Sinus bradycardia QRS Axis: normal Intervals: Left anterior fascicular block ST/T Wave abnormalities: Non-specific ST segment / T-wave changes, but no clear evidence of acute ischemia. Narrative Interpretation: no definitive evidence of acute ischemia; does not meet STEMI  criteria.    RADIOLOGY I viewed and interpreted all of the patient's imaging including hip and pelvis x-rays of both the left and the right hip, left knee x-rays, and CT scans of the head and C-spine.  There are no acute fractures or dislocations on any of the imaging.  The radiologist mentioned a pelvic fracture of indeterminate age.    PROCEDURES:  Critical Care performed: No  ..Laceration Repair  Date/Time: 12/13/2021 5:12 AM  Performed by: Hinda Kehr, MD Authorized by: Hinda Kehr, MD   Consent:    Consent obtained:  Verbal   Consent given by:  Healthcare agent   Risks discussed:  Infection, pain and need for additional repair Universal protocol:    Patient identity confirmed:  Verbally with patient and arm band Laceration details:    Location:  Scalp   Scalp location:  Occipital   Length (cm):  0.8 Treatment:    Amount of cleaning:  Extensive   Irrigation solution:  Sterile saline Skin repair:    Repair method:  Staples   Number of staples:  2 Repair type:    Repair type:  Simple Post-procedure details:    Procedure completion:  Tolerated .Marland KitchenLaceration Repair  Date/Time: 12/13/2021 5:12 AM  Performed by: Hinda Kehr, MD Authorized by: Hinda Kehr, MD   Consent:    Consent obtained:  Verbal   Consent given by:  Healthcare agent   Risks discussed:  Infection and need for additional repair Universal protocol:    Patient identity confirmed:  Verbally with patient and arm band Laceration details:    Location:  Leg   Leg location:  L knee   Length (cm):  3 Skin repair:    Repair method:  Steri-Strips   Number of Steri-Strips:  3 Approximation:    Approximation:  Close Repair type:    Repair type:  Simple Post-procedure details:    Procedure completion:  Tolerated well, no immediate complications    MEDICATIONS ORDERED IN ED: Medications  acetaminophen (TYLENOL) tablet 1,000 mg (1,000 mg Oral Given 12/13/21 0512)  cephALEXin (KEFLEX) capsule  500 mg (500 mg Oral Given 12/13/21 0512)     IMPRESSION / MDM / [place] / ED COURSE  I reviewed the triage vital signs and the nursing notes.  Differential diagnosis includes, but is not limited to, fracture, dislocation, intracranial bleed, spinal injury, laceration, electrolyte or metabolic abnormality, acute infection such as UTI.  Patient's presentation is most consistent with acute presentation with potential threat to life or bodily function.  Labs/studies ordered: EKG, head and C-spine CT, x-rays of knee, bilateral hips, and pelvis, UA, urine culture, CBC with differential, BMP.  Lab results are notable for urinary tract infection.  BMP is essentially normal.  Mild leukocytosis.  Imaging shows no sign of acute fracture.  I asked the patient's daughter about the pelvic fracture, and she confirms that the patient broke her pelvis in the past.  The patient is weightbearing without any pain, and she had no pain with manipulation and full range of motion of her lower extremities and with applying pressure to her pelvis.  I am reassured she has no acute bony injury to her pelvis or lower extremities.  I repaired the skin tear on her left leg as described above.  Scalp injury repaired with 2 staples as described above.  Patient has a bit of a headache for which I ordered Tylenol 1000 mg.  I also ordered Keflex 500 mg p.o. to start treatment for the UTI.  No evidence of acute injury requiring hospitalization.  She has been observed for more than 4 hours in the ED and she is at her baseline.  Daughter is comfortable taking her home.  I wrote a prescription for cefadroxil for her UTI and recommended close outpatient follow-up including staple removal in 1 week.  I gave my usual and customary return precautions.      FINAL CLINICAL IMPRESSION(S) / ED DIAGNOSES   Final diagnoses:  Fall, initial encounter  Laceration of scalp, initial encounter  Injury  of head, initial encounter  Noninfected skin tear of leg, left, initial encounter  Urinary tract infection without hematuria, site unspecified     Rx / DC Orders   ED Discharge Orders          Ordered    cefadroxil (DURICEF) 500 MG capsule  2 times daily        12/13/21 0439             Note:  This document was prepared using Dragon voice recognition software and may include unintentional dictation errors.   Hinda Kehr, MD 12/13/21 [phone]

## 2021-12-13 NOTE — ED Notes (Signed)
Patient transported to CT 

## 2021-12-14 LAB — URINE CULTURE

## 2021-12-21 DIAGNOSIS — F0280 Dementia in other diseases classified elsewhere without behavioral disturbance: Secondary | ICD-10-CM | POA: Diagnosis not present

## 2021-12-21 DIAGNOSIS — Z79899 Other long term (current) drug therapy: Secondary | ICD-10-CM | POA: Diagnosis not present

## 2021-12-21 DIAGNOSIS — S22060S Wedge compression fracture of T7-T8 vertebra, sequela: Secondary | ICD-10-CM | POA: Diagnosis not present

## 2021-12-21 DIAGNOSIS — G301 Alzheimer's disease with late onset: Secondary | ICD-10-CM | POA: Diagnosis not present

## 2021-12-21 DIAGNOSIS — Z0000 Encounter for general adult medical examination without abnormal findings: Secondary | ICD-10-CM | POA: Diagnosis not present

## 2021-12-21 DIAGNOSIS — J701 Chronic and other pulmonary manifestations due to radiation: Secondary | ICD-10-CM | POA: Diagnosis not present

## 2021-12-21 DIAGNOSIS — Z23 Encounter for immunization: Secondary | ICD-10-CM | POA: Diagnosis not present

## 2021-12-21 DIAGNOSIS — F330 Major depressive disorder, recurrent, mild: Secondary | ICD-10-CM | POA: Diagnosis not present

## 2021-12-21 DIAGNOSIS — E538 Deficiency of other specified B group vitamins: Secondary | ICD-10-CM | POA: Diagnosis not present

## 2022-03-17 DIAGNOSIS — E538 Deficiency of other specified B group vitamins: Secondary | ICD-10-CM | POA: Diagnosis not present

## 2022-03-17 DIAGNOSIS — Z96642 Presence of left artificial hip joint: Secondary | ICD-10-CM | POA: Diagnosis not present

## 2022-03-17 DIAGNOSIS — G301 Alzheimer's disease with late onset: Secondary | ICD-10-CM | POA: Diagnosis not present

## 2022-03-17 DIAGNOSIS — M519 Unspecified thoracic, thoracolumbar and lumbosacral intervertebral disc disorder: Secondary | ICD-10-CM | POA: Diagnosis not present

## 2022-03-17 DIAGNOSIS — I495 Sick sinus syndrome: Secondary | ICD-10-CM | POA: Diagnosis not present

## 2022-03-17 DIAGNOSIS — I10 Essential (primary) hypertension: Secondary | ICD-10-CM | POA: Diagnosis not present

## 2022-03-17 DIAGNOSIS — Z8781 Personal history of (healed) traumatic fracture: Secondary | ICD-10-CM | POA: Diagnosis not present

## 2022-03-17 DIAGNOSIS — Z853 Personal history of malignant neoplasm of breast: Secondary | ICD-10-CM | POA: Diagnosis not present

## 2022-03-17 DIAGNOSIS — Z95828 Presence of other vascular implants and grafts: Secondary | ICD-10-CM | POA: Diagnosis not present

## 2022-03-17 DIAGNOSIS — Z9181 History of falling: Secondary | ICD-10-CM | POA: Diagnosis not present

## 2022-03-17 DIAGNOSIS — E039 Hypothyroidism, unspecified: Secondary | ICD-10-CM | POA: Diagnosis not present

## 2022-03-17 DIAGNOSIS — M810 Age-related osteoporosis without current pathological fracture: Secondary | ICD-10-CM | POA: Diagnosis not present

## 2022-03-17 DIAGNOSIS — F0283 Dementia in other diseases classified elsewhere, unspecified severity, with mood disturbance: Secondary | ICD-10-CM | POA: Diagnosis not present

## 2022-03-17 DIAGNOSIS — F330 Major depressive disorder, recurrent, mild: Secondary | ICD-10-CM | POA: Diagnosis not present

## 2022-03-18 DIAGNOSIS — Z95828 Presence of other vascular implants and grafts: Secondary | ICD-10-CM | POA: Diagnosis not present

## 2022-03-18 DIAGNOSIS — G301 Alzheimer's disease with late onset: Secondary | ICD-10-CM | POA: Diagnosis not present

## 2022-03-18 DIAGNOSIS — F0283 Dementia in other diseases classified elsewhere, unspecified severity, with mood disturbance: Secondary | ICD-10-CM | POA: Diagnosis not present

## 2022-03-18 DIAGNOSIS — Z853 Personal history of malignant neoplasm of breast: Secondary | ICD-10-CM | POA: Diagnosis not present

## 2022-03-18 DIAGNOSIS — F330 Major depressive disorder, recurrent, mild: Secondary | ICD-10-CM | POA: Diagnosis not present

## 2022-03-18 DIAGNOSIS — E039 Hypothyroidism, unspecified: Secondary | ICD-10-CM | POA: Diagnosis not present

## 2022-03-18 DIAGNOSIS — I495 Sick sinus syndrome: Secondary | ICD-10-CM | POA: Diagnosis not present

## 2022-03-18 DIAGNOSIS — M519 Unspecified thoracic, thoracolumbar and lumbosacral intervertebral disc disorder: Secondary | ICD-10-CM | POA: Diagnosis not present

## 2022-03-18 DIAGNOSIS — I10 Essential (primary) hypertension: Secondary | ICD-10-CM | POA: Diagnosis not present

## 2022-03-18 DIAGNOSIS — Z96642 Presence of left artificial hip joint: Secondary | ICD-10-CM | POA: Diagnosis not present

## 2022-03-18 DIAGNOSIS — Z8781 Personal history of (healed) traumatic fracture: Secondary | ICD-10-CM | POA: Diagnosis not present

## 2022-03-18 DIAGNOSIS — E538 Deficiency of other specified B group vitamins: Secondary | ICD-10-CM | POA: Diagnosis not present

## 2022-03-18 DIAGNOSIS — Z9181 History of falling: Secondary | ICD-10-CM | POA: Diagnosis not present

## 2022-03-18 DIAGNOSIS — M810 Age-related osteoporosis without current pathological fracture: Secondary | ICD-10-CM | POA: Diagnosis not present

## 2022-03-30 DIAGNOSIS — F0283 Dementia in other diseases classified elsewhere, unspecified severity, with mood disturbance: Secondary | ICD-10-CM | POA: Diagnosis not present

## 2022-03-30 DIAGNOSIS — Z8781 Personal history of (healed) traumatic fracture: Secondary | ICD-10-CM | POA: Diagnosis not present

## 2022-03-30 DIAGNOSIS — I10 Essential (primary) hypertension: Secondary | ICD-10-CM | POA: Diagnosis not present

## 2022-03-30 DIAGNOSIS — F330 Major depressive disorder, recurrent, mild: Secondary | ICD-10-CM | POA: Diagnosis not present

## 2022-03-30 DIAGNOSIS — I495 Sick sinus syndrome: Secondary | ICD-10-CM | POA: Diagnosis not present

## 2022-03-30 DIAGNOSIS — G301 Alzheimer's disease with late onset: Secondary | ICD-10-CM | POA: Diagnosis not present

## 2022-03-30 DIAGNOSIS — E538 Deficiency of other specified B group vitamins: Secondary | ICD-10-CM | POA: Diagnosis not present

## 2022-03-30 DIAGNOSIS — M810 Age-related osteoporosis without current pathological fracture: Secondary | ICD-10-CM | POA: Diagnosis not present

## 2022-03-30 DIAGNOSIS — E039 Hypothyroidism, unspecified: Secondary | ICD-10-CM | POA: Diagnosis not present

## 2022-03-30 DIAGNOSIS — Z853 Personal history of malignant neoplasm of breast: Secondary | ICD-10-CM | POA: Diagnosis not present

## 2022-03-30 DIAGNOSIS — M519 Unspecified thoracic, thoracolumbar and lumbosacral intervertebral disc disorder: Secondary | ICD-10-CM | POA: Diagnosis not present

## 2022-03-30 DIAGNOSIS — Z9181 History of falling: Secondary | ICD-10-CM | POA: Diagnosis not present

## 2022-03-30 DIAGNOSIS — Z96642 Presence of left artificial hip joint: Secondary | ICD-10-CM | POA: Diagnosis not present

## 2022-03-30 DIAGNOSIS — Z95828 Presence of other vascular implants and grafts: Secondary | ICD-10-CM | POA: Diagnosis not present

## 2022-04-04 DIAGNOSIS — E039 Hypothyroidism, unspecified: Secondary | ICD-10-CM | POA: Diagnosis not present

## 2022-04-04 DIAGNOSIS — M810 Age-related osteoporosis without current pathological fracture: Secondary | ICD-10-CM | POA: Diagnosis not present

## 2022-04-04 DIAGNOSIS — I10 Essential (primary) hypertension: Secondary | ICD-10-CM | POA: Diagnosis not present

## 2022-04-04 DIAGNOSIS — Z96642 Presence of left artificial hip joint: Secondary | ICD-10-CM | POA: Diagnosis not present

## 2022-04-04 DIAGNOSIS — Z95828 Presence of other vascular implants and grafts: Secondary | ICD-10-CM | POA: Diagnosis not present

## 2022-04-04 DIAGNOSIS — Z853 Personal history of malignant neoplasm of breast: Secondary | ICD-10-CM | POA: Diagnosis not present

## 2022-04-04 DIAGNOSIS — Z9181 History of falling: Secondary | ICD-10-CM | POA: Diagnosis not present

## 2022-04-04 DIAGNOSIS — E538 Deficiency of other specified B group vitamins: Secondary | ICD-10-CM | POA: Diagnosis not present

## 2022-04-04 DIAGNOSIS — M519 Unspecified thoracic, thoracolumbar and lumbosacral intervertebral disc disorder: Secondary | ICD-10-CM | POA: Diagnosis not present

## 2022-04-04 DIAGNOSIS — Z8781 Personal history of (healed) traumatic fracture: Secondary | ICD-10-CM | POA: Diagnosis not present

## 2022-04-04 DIAGNOSIS — F330 Major depressive disorder, recurrent, mild: Secondary | ICD-10-CM | POA: Diagnosis not present

## 2022-04-04 DIAGNOSIS — I495 Sick sinus syndrome: Secondary | ICD-10-CM | POA: Diagnosis not present

## 2022-04-04 DIAGNOSIS — G301 Alzheimer's disease with late onset: Secondary | ICD-10-CM | POA: Diagnosis not present

## 2022-04-04 DIAGNOSIS — F0283 Dementia in other diseases classified elsewhere, unspecified severity, with mood disturbance: Secondary | ICD-10-CM | POA: Diagnosis not present

## 2022-04-08 DIAGNOSIS — F330 Major depressive disorder, recurrent, mild: Secondary | ICD-10-CM | POA: Diagnosis not present

## 2022-04-08 DIAGNOSIS — M519 Unspecified thoracic, thoracolumbar and lumbosacral intervertebral disc disorder: Secondary | ICD-10-CM | POA: Diagnosis not present

## 2022-04-08 DIAGNOSIS — I10 Essential (primary) hypertension: Secondary | ICD-10-CM | POA: Diagnosis not present

## 2022-04-08 DIAGNOSIS — I495 Sick sinus syndrome: Secondary | ICD-10-CM | POA: Diagnosis not present

## 2022-04-08 DIAGNOSIS — G301 Alzheimer's disease with late onset: Secondary | ICD-10-CM | POA: Diagnosis not present

## 2022-04-08 DIAGNOSIS — F0283 Dementia in other diseases classified elsewhere, unspecified severity, with mood disturbance: Secondary | ICD-10-CM | POA: Diagnosis not present

## 2022-04-08 DIAGNOSIS — M810 Age-related osteoporosis without current pathological fracture: Secondary | ICD-10-CM | POA: Diagnosis not present

## 2022-04-12 DIAGNOSIS — G301 Alzheimer's disease with late onset: Secondary | ICD-10-CM | POA: Diagnosis not present

## 2022-04-12 DIAGNOSIS — F330 Major depressive disorder, recurrent, mild: Secondary | ICD-10-CM | POA: Diagnosis not present

## 2022-04-12 DIAGNOSIS — M810 Age-related osteoporosis without current pathological fracture: Secondary | ICD-10-CM | POA: Diagnosis not present

## 2022-04-12 DIAGNOSIS — I495 Sick sinus syndrome: Secondary | ICD-10-CM | POA: Diagnosis not present

## 2022-04-12 DIAGNOSIS — E039 Hypothyroidism, unspecified: Secondary | ICD-10-CM | POA: Diagnosis not present

## 2022-04-12 DIAGNOSIS — F0283 Dementia in other diseases classified elsewhere, unspecified severity, with mood disturbance: Secondary | ICD-10-CM | POA: Diagnosis not present

## 2022-04-12 DIAGNOSIS — Z8781 Personal history of (healed) traumatic fracture: Secondary | ICD-10-CM | POA: Diagnosis not present

## 2022-04-12 DIAGNOSIS — Z853 Personal history of malignant neoplasm of breast: Secondary | ICD-10-CM | POA: Diagnosis not present

## 2022-04-12 DIAGNOSIS — Z96642 Presence of left artificial hip joint: Secondary | ICD-10-CM | POA: Diagnosis not present

## 2022-04-12 DIAGNOSIS — I10 Essential (primary) hypertension: Secondary | ICD-10-CM | POA: Diagnosis not present

## 2022-04-12 DIAGNOSIS — Z9181 History of falling: Secondary | ICD-10-CM | POA: Diagnosis not present

## 2022-04-12 DIAGNOSIS — Z95828 Presence of other vascular implants and grafts: Secondary | ICD-10-CM | POA: Diagnosis not present

## 2022-04-12 DIAGNOSIS — M519 Unspecified thoracic, thoracolumbar and lumbosacral intervertebral disc disorder: Secondary | ICD-10-CM | POA: Diagnosis not present

## 2022-04-12 DIAGNOSIS — E538 Deficiency of other specified B group vitamins: Secondary | ICD-10-CM | POA: Diagnosis not present

## 2022-04-20 DIAGNOSIS — M810 Age-related osteoporosis without current pathological fracture: Secondary | ICD-10-CM | POA: Diagnosis not present

## 2022-04-20 DIAGNOSIS — F330 Major depressive disorder, recurrent, mild: Secondary | ICD-10-CM | POA: Diagnosis not present

## 2022-04-20 DIAGNOSIS — F0283 Dementia in other diseases classified elsewhere, unspecified severity, with mood disturbance: Secondary | ICD-10-CM | POA: Diagnosis not present

## 2022-04-20 DIAGNOSIS — M519 Unspecified thoracic, thoracolumbar and lumbosacral intervertebral disc disorder: Secondary | ICD-10-CM | POA: Diagnosis not present

## 2022-04-20 DIAGNOSIS — Z9181 History of falling: Secondary | ICD-10-CM | POA: Diagnosis not present

## 2022-04-20 DIAGNOSIS — G301 Alzheimer's disease with late onset: Secondary | ICD-10-CM | POA: Diagnosis not present

## 2022-04-20 DIAGNOSIS — I495 Sick sinus syndrome: Secondary | ICD-10-CM | POA: Diagnosis not present

## 2022-04-20 DIAGNOSIS — Z95828 Presence of other vascular implants and grafts: Secondary | ICD-10-CM | POA: Diagnosis not present

## 2022-04-20 DIAGNOSIS — Z96642 Presence of left artificial hip joint: Secondary | ICD-10-CM | POA: Diagnosis not present

## 2022-04-20 DIAGNOSIS — Z8781 Personal history of (healed) traumatic fracture: Secondary | ICD-10-CM | POA: Diagnosis not present

## 2022-04-20 DIAGNOSIS — I10 Essential (primary) hypertension: Secondary | ICD-10-CM | POA: Diagnosis not present

## 2022-04-20 DIAGNOSIS — Z853 Personal history of malignant neoplasm of breast: Secondary | ICD-10-CM | POA: Diagnosis not present

## 2022-04-20 DIAGNOSIS — E538 Deficiency of other specified B group vitamins: Secondary | ICD-10-CM | POA: Diagnosis not present

## 2022-04-20 DIAGNOSIS — E039 Hypothyroidism, unspecified: Secondary | ICD-10-CM | POA: Diagnosis not present

## 2022-04-26 DIAGNOSIS — E039 Hypothyroidism, unspecified: Secondary | ICD-10-CM | POA: Diagnosis not present

## 2022-04-26 DIAGNOSIS — M519 Unspecified thoracic, thoracolumbar and lumbosacral intervertebral disc disorder: Secondary | ICD-10-CM | POA: Diagnosis not present

## 2022-04-26 DIAGNOSIS — Z8781 Personal history of (healed) traumatic fracture: Secondary | ICD-10-CM | POA: Diagnosis not present

## 2022-04-26 DIAGNOSIS — I495 Sick sinus syndrome: Secondary | ICD-10-CM | POA: Diagnosis not present

## 2022-04-26 DIAGNOSIS — F0283 Dementia in other diseases classified elsewhere, unspecified severity, with mood disturbance: Secondary | ICD-10-CM | POA: Diagnosis not present

## 2022-04-26 DIAGNOSIS — E538 Deficiency of other specified B group vitamins: Secondary | ICD-10-CM | POA: Diagnosis not present

## 2022-04-26 DIAGNOSIS — G301 Alzheimer's disease with late onset: Secondary | ICD-10-CM | POA: Diagnosis not present

## 2022-04-26 DIAGNOSIS — Z9181 History of falling: Secondary | ICD-10-CM | POA: Diagnosis not present

## 2022-04-26 DIAGNOSIS — F330 Major depressive disorder, recurrent, mild: Secondary | ICD-10-CM | POA: Diagnosis not present

## 2022-04-26 DIAGNOSIS — Z96642 Presence of left artificial hip joint: Secondary | ICD-10-CM | POA: Diagnosis not present

## 2022-04-26 DIAGNOSIS — M810 Age-related osteoporosis without current pathological fracture: Secondary | ICD-10-CM | POA: Diagnosis not present

## 2022-04-26 DIAGNOSIS — Z95828 Presence of other vascular implants and grafts: Secondary | ICD-10-CM | POA: Diagnosis not present

## 2022-04-26 DIAGNOSIS — I10 Essential (primary) hypertension: Secondary | ICD-10-CM | POA: Diagnosis not present

## 2022-04-26 DIAGNOSIS — Z853 Personal history of malignant neoplasm of breast: Secondary | ICD-10-CM | POA: Diagnosis not present

## 2022-05-03 DIAGNOSIS — G301 Alzheimer's disease with late onset: Secondary | ICD-10-CM | POA: Diagnosis not present

## 2022-05-03 DIAGNOSIS — F0280 Dementia in other diseases classified elsewhere without behavioral disturbance: Secondary | ICD-10-CM | POA: Diagnosis not present

## 2022-05-03 DIAGNOSIS — Z0000 Encounter for general adult medical examination without abnormal findings: Secondary | ICD-10-CM | POA: Diagnosis not present

## 2022-05-03 DIAGNOSIS — F330 Major depressive disorder, recurrent, mild: Secondary | ICD-10-CM | POA: Diagnosis not present

## 2022-05-03 DIAGNOSIS — S22080D Wedge compression fracture of T11-T12 vertebra, subsequent encounter for fracture with routine healing: Secondary | ICD-10-CM | POA: Diagnosis not present

## 2022-05-03 DIAGNOSIS — I495 Sick sinus syndrome: Secondary | ICD-10-CM | POA: Diagnosis not present

## 2022-05-03 DIAGNOSIS — I700 Atherosclerosis of aorta: Secondary | ICD-10-CM | POA: Diagnosis not present

## 2022-05-03 DIAGNOSIS — J701 Chronic and other pulmonary manifestations due to radiation: Secondary | ICD-10-CM | POA: Diagnosis not present

## 2022-05-11 DIAGNOSIS — M519 Unspecified thoracic, thoracolumbar and lumbosacral intervertebral disc disorder: Secondary | ICD-10-CM | POA: Diagnosis not present

## 2022-05-11 DIAGNOSIS — Z853 Personal history of malignant neoplasm of breast: Secondary | ICD-10-CM | POA: Diagnosis not present

## 2022-05-11 DIAGNOSIS — I10 Essential (primary) hypertension: Secondary | ICD-10-CM | POA: Diagnosis not present

## 2022-05-11 DIAGNOSIS — Z9181 History of falling: Secondary | ICD-10-CM | POA: Diagnosis not present

## 2022-05-11 DIAGNOSIS — Z95828 Presence of other vascular implants and grafts: Secondary | ICD-10-CM | POA: Diagnosis not present

## 2022-05-11 DIAGNOSIS — I495 Sick sinus syndrome: Secondary | ICD-10-CM | POA: Diagnosis not present

## 2022-05-11 DIAGNOSIS — M810 Age-related osteoporosis without current pathological fracture: Secondary | ICD-10-CM | POA: Diagnosis not present

## 2022-05-11 DIAGNOSIS — Z8781 Personal history of (healed) traumatic fracture: Secondary | ICD-10-CM | POA: Diagnosis not present

## 2022-05-11 DIAGNOSIS — E538 Deficiency of other specified B group vitamins: Secondary | ICD-10-CM | POA: Diagnosis not present

## 2022-05-11 DIAGNOSIS — F0283 Dementia in other diseases classified elsewhere, unspecified severity, with mood disturbance: Secondary | ICD-10-CM | POA: Diagnosis not present

## 2022-05-11 DIAGNOSIS — G301 Alzheimer's disease with late onset: Secondary | ICD-10-CM | POA: Diagnosis not present

## 2022-05-11 DIAGNOSIS — E039 Hypothyroidism, unspecified: Secondary | ICD-10-CM | POA: Diagnosis not present

## 2022-05-11 DIAGNOSIS — F330 Major depressive disorder, recurrent, mild: Secondary | ICD-10-CM | POA: Diagnosis not present

## 2022-05-11 DIAGNOSIS — Z96642 Presence of left artificial hip joint: Secondary | ICD-10-CM | POA: Diagnosis not present

## 2022-05-19 DIAGNOSIS — Z0279 Encounter for issue of other medical certificate: Secondary | ICD-10-CM | POA: Diagnosis not present

## 2022-05-22 ENCOUNTER — Other Ambulatory Visit: Payer: Self-pay

## 2022-05-22 ENCOUNTER — Emergency Department: Payer: PPO

## 2022-05-22 ENCOUNTER — Inpatient Hospital Stay
Admission: EM | Admit: 2022-05-22 | Discharge: 2022-06-01 | DRG: 480 | Disposition: A | Discharge status: Skilled Nursing Facility | Payer: PPO | Attending: Student | Admitting: Student

## 2022-05-22 ENCOUNTER — Inpatient Hospital Stay: Payer: PPO

## 2022-05-22 DIAGNOSIS — Z955 Presence of coronary angioplasty implant and graft: Secondary | ICD-10-CM | POA: Diagnosis not present

## 2022-05-22 DIAGNOSIS — Z79890 Hormone replacement therapy (postmenopausal): Secondary | ICD-10-CM

## 2022-05-22 DIAGNOSIS — Z79899 Other long term (current) drug therapy: Secondary | ICD-10-CM

## 2022-05-22 DIAGNOSIS — I959 Hypotension, unspecified: Secondary | ICD-10-CM | POA: Diagnosis not present

## 2022-05-22 DIAGNOSIS — E876 Hypokalemia: Secondary | ICD-10-CM | POA: Diagnosis present

## 2022-05-22 DIAGNOSIS — Z6820 Body mass index (BMI) 20.0-20.9, adult: Secondary | ICD-10-CM | POA: Diagnosis not present

## 2022-05-22 DIAGNOSIS — R112 Nausea with vomiting, unspecified: Secondary | ICD-10-CM | POA: Diagnosis not present

## 2022-05-22 DIAGNOSIS — F03A18 Unspecified dementia, mild, with other behavioral disturbance: Secondary | ICD-10-CM | POA: Diagnosis not present

## 2022-05-22 DIAGNOSIS — E039 Hypothyroidism, unspecified: Secondary | ICD-10-CM | POA: Diagnosis present

## 2022-05-22 DIAGNOSIS — Z043 Encounter for examination and observation following other accident: Secondary | ICD-10-CM | POA: Diagnosis not present

## 2022-05-22 DIAGNOSIS — S72001A Fracture of unspecified part of neck of right femur, initial encounter for closed fracture: Secondary | ICD-10-CM | POA: Diagnosis present

## 2022-05-22 DIAGNOSIS — Z9221 Personal history of antineoplastic chemotherapy: Secondary | ICD-10-CM | POA: Diagnosis not present

## 2022-05-22 DIAGNOSIS — R001 Bradycardia, unspecified: Secondary | ICD-10-CM | POA: Diagnosis not present

## 2022-05-22 DIAGNOSIS — Z853 Personal history of malignant neoplasm of breast: Secondary | ICD-10-CM | POA: Diagnosis not present

## 2022-05-22 DIAGNOSIS — G9341 Metabolic encephalopathy: Secondary | ICD-10-CM | POA: Diagnosis not present

## 2022-05-22 DIAGNOSIS — W19XXXD Unspecified fall, subsequent encounter: Secondary | ICD-10-CM | POA: Diagnosis not present

## 2022-05-22 DIAGNOSIS — R609 Edema, unspecified: Secondary | ICD-10-CM | POA: Diagnosis not present

## 2022-05-22 DIAGNOSIS — I110 Hypertensive heart disease with heart failure: Secondary | ICD-10-CM | POA: Diagnosis not present

## 2022-05-22 DIAGNOSIS — S72141D Displaced intertrochanteric fracture of right femur, subsequent encounter for closed fracture with routine healing: Secondary | ICD-10-CM | POA: Diagnosis not present

## 2022-05-22 DIAGNOSIS — D72829 Elevated white blood cell count, unspecified: Secondary | ICD-10-CM | POA: Diagnosis not present

## 2022-05-22 DIAGNOSIS — K5900 Constipation, unspecified: Secondary | ICD-10-CM | POA: Diagnosis not present

## 2022-05-22 DIAGNOSIS — K219 Gastro-esophageal reflux disease without esophagitis: Secondary | ICD-10-CM | POA: Diagnosis not present

## 2022-05-22 DIAGNOSIS — Z7901 Long term (current) use of anticoagulants: Secondary | ICD-10-CM | POA: Diagnosis not present

## 2022-05-22 DIAGNOSIS — Z87891 Personal history of nicotine dependence: Secondary | ICD-10-CM

## 2022-05-22 DIAGNOSIS — I5032 Chronic diastolic (congestive) heart failure: Secondary | ICD-10-CM | POA: Diagnosis present

## 2022-05-22 DIAGNOSIS — E43 Unspecified severe protein-calorie malnutrition: Secondary | ICD-10-CM | POA: Diagnosis not present

## 2022-05-22 DIAGNOSIS — S72141A Displaced intertrochanteric fracture of right femur, initial encounter for closed fracture: Secondary | ICD-10-CM | POA: Diagnosis not present

## 2022-05-22 DIAGNOSIS — M62838 Other muscle spasm: Secondary | ICD-10-CM | POA: Diagnosis not present

## 2022-05-22 DIAGNOSIS — I10 Essential (primary) hypertension: Secondary | ICD-10-CM | POA: Diagnosis present

## 2022-05-22 DIAGNOSIS — E559 Vitamin D deficiency, unspecified: Secondary | ICD-10-CM | POA: Diagnosis present

## 2022-05-22 DIAGNOSIS — F0393 Unspecified dementia, unspecified severity, with mood disturbance: Secondary | ICD-10-CM | POA: Diagnosis present

## 2022-05-22 DIAGNOSIS — F0390 Unspecified dementia without behavioral disturbance: Secondary | ICD-10-CM | POA: Diagnosis present

## 2022-05-22 DIAGNOSIS — Z23 Encounter for immunization: Secondary | ICD-10-CM | POA: Diagnosis not present

## 2022-05-22 DIAGNOSIS — F418 Other specified anxiety disorders: Secondary | ICD-10-CM | POA: Diagnosis not present

## 2022-05-22 DIAGNOSIS — W050XXA Fall from non-moving wheelchair, initial encounter: Secondary | ICD-10-CM | POA: Diagnosis present

## 2022-05-22 DIAGNOSIS — R2689 Other abnormalities of gait and mobility: Secondary | ICD-10-CM | POA: Diagnosis not present

## 2022-05-22 DIAGNOSIS — W19XXXA Unspecified fall, initial encounter: Secondary | ICD-10-CM

## 2022-05-22 DIAGNOSIS — Z96642 Presence of left artificial hip joint: Secondary | ICD-10-CM | POA: Diagnosis not present

## 2022-05-22 DIAGNOSIS — Y92009 Unspecified place in unspecified non-institutional (private) residence as the place of occurrence of the external cause: Secondary | ICD-10-CM

## 2022-05-22 DIAGNOSIS — S2241XA Multiple fractures of ribs, right side, initial encounter for closed fracture: Secondary | ICD-10-CM | POA: Diagnosis not present

## 2022-05-22 DIAGNOSIS — E538 Deficiency of other specified B group vitamins: Secondary | ICD-10-CM | POA: Diagnosis not present

## 2022-05-22 DIAGNOSIS — R6889 Other general symptoms and signs: Secondary | ICD-10-CM | POA: Diagnosis not present

## 2022-05-22 DIAGNOSIS — Z923 Personal history of irradiation: Secondary | ICD-10-CM

## 2022-05-22 DIAGNOSIS — R102 Pelvic and perineal pain: Secondary | ICD-10-CM | POA: Diagnosis not present

## 2022-05-22 DIAGNOSIS — D62 Acute posthemorrhagic anemia: Secondary | ICD-10-CM | POA: Diagnosis not present

## 2022-05-22 DIAGNOSIS — S2241XD Multiple fractures of ribs, right side, subsequent encounter for fracture with routine healing: Secondary | ICD-10-CM | POA: Diagnosis not present

## 2022-05-22 DIAGNOSIS — S2231XA Fracture of one rib, right side, initial encounter for closed fracture: Secondary | ICD-10-CM | POA: Diagnosis present

## 2022-05-22 DIAGNOSIS — Z7401 Bed confinement status: Secondary | ICD-10-CM | POA: Diagnosis not present

## 2022-05-22 DIAGNOSIS — M6281 Muscle weakness (generalized): Secondary | ICD-10-CM | POA: Diagnosis not present

## 2022-05-22 LAB — CBC WITH DIFFERENTIAL/PLATELET
Abs Immature Granulocytes: 0.06 10*3/uL (ref 0.00–0.07)
Basophils Absolute: 0.1 10*3/uL (ref 0.0–0.1)
Basophils Relative: 1 %
Eosinophils Absolute: 0 10*3/uL (ref 0.0–0.5)
Eosinophils Relative: 0 %
HCT: 35.6 % — ABNORMAL LOW (ref 36.0–46.0)
Hemoglobin: 12.2 g/dL (ref 12.0–15.0)
Immature Granulocytes: 1 %
Lymphocytes Relative: 9 %
Lymphs Abs: 1.1 10*3/uL (ref 0.7–4.0)
MCH: 29.1 pg (ref 26.0–34.0)
MCHC: 34.3 g/dL (ref 30.0–36.0)
MCV: 85 fL (ref 80.0–100.0)
Monocytes Absolute: 1 10*3/uL (ref 0.1–1.0)
Monocytes Relative: 8 %
Neutro Abs: 10.4 10*3/uL — ABNORMAL HIGH (ref 1.7–7.7)
Neutrophils Relative %: 81 %
Platelets: 272 10*3/uL (ref 150–400)
RBC: 4.19 MIL/uL (ref 3.87–5.11)
RDW: 14.6 % (ref 11.5–15.5)
WBC: 12.7 10*3/uL — ABNORMAL HIGH (ref 4.0–10.5)
nRBC: 0 % (ref 0.0–0.2)

## 2022-05-22 LAB — COMPREHENSIVE METABOLIC PANEL
ALT: 14 U/L (ref 0–44)
AST: 23 U/L (ref 15–41)
Albumin: 3.7 g/dL (ref 3.5–5.0)
Alkaline Phosphatase: 95 U/L (ref 38–126)
Anion gap: 11 (ref 5–15)
BUN: 13 mg/dL (ref 8–23)
CO2: 23 mmol/L (ref 22–32)
Calcium: 8.8 mg/dL — ABNORMAL LOW (ref 8.9–10.3)
Chloride: 100 mmol/L (ref 98–111)
Creatinine, Ser: 0.71 mg/dL (ref 0.44–1.00)
GFR, Estimated: >60 mL/min (ref >60)
Glucose, Bld: 126 mg/dL — ABNORMAL HIGH (ref 70–99)
Potassium: 3.7 mmol/L (ref 3.5–5.1)
Sodium: 134 mmol/L — ABNORMAL LOW (ref 135–145)
Total Bilirubin: 1.9 mg/dL — ABNORMAL HIGH (ref 0.3–1.2)
Total Protein: 7.5 g/dL (ref 6.5–8.1)

## 2022-05-22 LAB — APTT: aPTT: 37 seconds — ABNORMAL HIGH (ref 24–36)

## 2022-05-22 LAB — TYPE AND SCREEN
ABO/RH(D): O NEG
Antibody Screen: NEGATIVE

## 2022-05-22 LAB — BRAIN NATRIURETIC PEPTIDE: B Natriuretic Peptide: 204.5 pg/mL — ABNORMAL HIGH (ref 0.0–100.0)

## 2022-05-22 LAB — PROTIME-INR
INR: 1.2 (ref 0.8–1.2)
Prothrombin Time: 15.2 seconds (ref 11.4–15.2)

## 2022-05-22 LAB — CK: Total CK: 113 U/L (ref 38–234)

## 2022-05-22 MED ORDER — SERTRALINE HCL 50 MG PO TABS
25.0000 mg | ORAL_TABLET | Freq: Every day | ORAL | Status: DC
  Administered 2022-05-22 – 2022-06-01 (×10): 25 mg via ORAL
  Filled 2022-05-22 (×10): qty 1

## 2022-05-22 MED ORDER — SODIUM CHLORIDE 0.9 % IV SOLN: INTRAVENOUS | Status: AC

## 2022-05-22 MED ORDER — PANTOPRAZOLE SODIUM 40 MG PO TBEC
40.0000 mg | DELAYED_RELEASE_TABLET | Freq: Every day | ORAL | Status: DC
  Administered 2022-05-22 – 2022-06-01 (×10): 40 mg via ORAL
  Filled 2022-05-22 (×10): qty 1

## 2022-05-22 MED ORDER — LIDOCAINE 5 % EX PTCH
1.0000 | MEDICATED_PATCH | CUTANEOUS | Status: DC
  Administered 2022-05-22 – 2022-05-31 (×10): 1 via TRANSDERMAL
  Filled 2022-05-22 (×10): qty 1

## 2022-05-22 MED ORDER — SODIUM CHLORIDE 0.9 % IV SOLN: INTRAVENOUS | Status: DC

## 2022-05-22 MED ORDER — VITAMIN B-12 1000 MCG PO TABS
1000.0000 ug | ORAL_TABLET | Freq: Every day | ORAL | Status: DC
  Administered 2022-05-22 – 2022-06-01 (×10): 1000 ug via ORAL
  Filled 2022-05-22 (×10): qty 1

## 2022-05-22 MED ORDER — LACTATED RINGERS IV BOLUS: 1000.0000 mL | Freq: Once | INTRAVENOUS | Status: DC

## 2022-05-22 MED ORDER — ACETAMINOPHEN 325 MG PO TABS
650.0000 mg | ORAL_TABLET | Freq: Four times a day (QID) | ORAL | Status: DC | PRN
  Administered 2022-05-23 – 2022-05-24 (×2): 650 mg via ORAL
  Filled 2022-05-22 (×2): qty 2

## 2022-05-22 MED ORDER — MEMANTINE HCL 5 MG PO TABS
5.0000 mg | ORAL_TABLET | Freq: Two times a day (BID) | ORAL | Status: DC
  Administered 2022-05-22 – 2022-06-01 (×19): 5 mg via ORAL
  Filled 2022-05-22 (×19): qty 1

## 2022-05-22 MED ORDER — ALBUTEROL SULFATE (2.5 MG/3ML) 0.083% IN NEBU: 2.5000 mg | INHALATION_SOLUTION | RESPIRATORY_TRACT | Status: DC | PRN

## 2022-05-22 MED ORDER — ENSURE ENLIVE PO LIQD
237.0000 mL | Freq: Two times a day (BID) | ORAL | Status: DC
  Administered 2022-05-24 – 2022-05-30 (×13): 237 mL via ORAL

## 2022-05-22 MED ORDER — OXYCODONE-ACETAMINOPHEN 5-325 MG PO TABS: 1.0000 | ORAL_TABLET | ORAL | Status: DC | PRN

## 2022-05-22 MED ORDER — QUETIAPINE FUMARATE 25 MG PO TABS
25.0000 mg | ORAL_TABLET | Freq: Every day | ORAL | Status: DC
  Administered 2022-05-22 – 2022-05-31 (×10): 25 mg via ORAL
  Filled 2022-05-22 (×10): qty 1

## 2022-05-22 MED ORDER — ONDANSETRON HCL 4 MG/2ML IJ SOLN
4.0000 mg | Freq: Three times a day (TID) | INTRAMUSCULAR | Status: DC | PRN
  Administered 2022-05-24: 4 mg via INTRAVENOUS
  Filled 2022-05-22: qty 2

## 2022-05-22 MED ORDER — SENNOSIDES-DOCUSATE SODIUM 8.6-50 MG PO TABS
1.0000 | ORAL_TABLET | Freq: Every evening | ORAL | Status: DC | PRN
  Administered 2022-05-23: 1 via ORAL
  Filled 2022-05-22: qty 1

## 2022-05-22 MED ORDER — HYDRALAZINE HCL 20 MG/ML IJ SOLN
5.0000 mg | INTRAMUSCULAR | Status: DC | PRN
  Administered 2022-05-24: 5 mg via INTRAVENOUS
  Filled 2022-05-22: qty 1

## 2022-05-22 MED ORDER — ALPRAZOLAM 0.25 MG PO TABS
0.2500 mg | ORAL_TABLET | Freq: Two times a day (BID) | ORAL | Status: DC | PRN
  Administered 2022-05-23 – 2022-05-31 (×4): 0.25 mg via ORAL
  Filled 2022-05-22 (×4): qty 1

## 2022-05-22 MED ORDER — LEVOTHYROXINE SODIUM 88 MCG PO TABS
88.0000 ug | ORAL_TABLET | Freq: Every day | ORAL | Status: DC
  Administered 2022-05-23 – 2022-06-01 (×10): 88 ug via ORAL
  Filled 2022-05-22 (×10): qty 1

## 2022-05-22 MED ORDER — DILTIAZEM HCL ER 120 MG PO TB24
120.0000 mg | ORAL_TABLET | Freq: Every day | ORAL | Status: DC
  Administered 2022-05-22 – 2022-05-27 (×5): 120 mg via ORAL
  Filled 2022-05-22 (×6): qty 1

## 2022-05-22 MED ORDER — MORPHINE SULFATE (PF) 2 MG/ML IV SOLN: 0.5000 mg | INTRAVENOUS | Status: DC | PRN

## 2022-05-22 MED ORDER — RIVASTIGMINE TARTRATE 3 MG PO CAPS
3.0000 mg | ORAL_CAPSULE | Freq: Two times a day (BID) | ORAL | Status: DC
  Administered 2022-05-22 – 2022-06-01 (×19): 3 mg via ORAL
  Filled 2022-05-22 (×20): qty 1

## 2022-05-22 MED ORDER — METHOCARBAMOL 500 MG PO TABS
500.0000 mg | ORAL_TABLET | Freq: Three times a day (TID) | ORAL | Status: DC | PRN
  Administered 2022-05-25 – 2022-05-29 (×4): 500 mg via ORAL
  Filled 2022-05-22 (×6): qty 1

## 2022-05-22 NOTE — ED Triage Notes (Signed)
Pt to ED from home via EMS CC of fall from wheelchair with no LOC/head injury. Pt has right sided leg shortening and external rotation. Pt family reporting she is altered at baseline but has been more confused recently. Pt is A&O x1 with hypertension.   18 R AC, 25 mcg Fentanyl, NO BP meds taken in AM, A fib with hx.
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
## 2022-05-22 NOTE — ED Provider Notes (Signed)
[facility] Provider Note    Event Date/Time   First MD Initiated Contact with Patient 05/22/22 1301     (approximate)   History   Chief Complaint: Fall   HPI  Lisa Martin is a 89 y.o. female with a past history of dementia, hypertension, GERD, hypothyroidism who is brought to the ED with increased confusion over the last few days, family suspects a UTI.  Last night she fell out of her wheelchair and was unable to get up.  Patient denies any complaints at present time.  Dementia limits her ability to provide a detailed history.     Physical Exam   Triage Vital Signs: ED Triage Vitals  Enc Vitals Group     BP 05/22/22 1301 (!) 195/92     Pulse Rate 05/22/22 1301 74     Resp 05/22/22 1301 13     Temp 05/22/22 1301 98.5 F (36.9 C)     Temp Source 05/22/22 1301 Oral     SpO2 05/22/22 1301 96 %     Weight --      Height --      Head Circumference --      Peak Flow --      Pain Score 05/22/22 1303 0     Pain Loc --      Pain Edu? --      Excl. in GC? --     Most recent vital signs: Vitals:   05/22/22 1301  BP: (!) 195/92  Pulse: 74  Resp: 13  Temp: 98.5 F (36.9 C)  SpO2: 96%    General: Awake, no distress.  CV:  Good peripheral perfusion.  Regular rhythm, normal rate.  Normal distal pulses Resp:  Normal effort.  Clear to quotation bilaterally Abd:  No distention.  Soft nontender Other:  No visible head trauma.  No C-spine tenderness.  There is pronounced shortening of the right lower extremity without focal bony tenderness.  No open wounds.  Chest wall stable and nontender.   ED Results / Procedures / Treatments   Labs (all labs ordered are listed, but only abnormal results are displayed) Labs Reviewed  CBC WITH DIFFERENTIAL/PLATELET - Abnormal; Notable for the following components:      Result Value   WBC 12.7 (*)    HCT 35.6 (*)    Neutro Abs 10.4 (*)    All other components within normal limits  COMPREHENSIVE METABOLIC  PANEL - Abnormal; Notable for the following components:   Sodium 134 (*)    Glucose, Bld 126 (*)    Calcium 8.8 (*)    Total Bilirubin 1.9 (*)    All other components within normal limits  URINALYSIS, W/ REFLEX TO CULTURE (INFECTION SUSPECTED)     EKG Interpreted by me Sinus rhythm, rate of 69.  Left axis, normal intervals.  Poor R wave progression.  Normal ST segments and T waves.  Frequent PACs.   RADIOLOGY X-ray right femur interpreted by me, shows intertrochanteric fracture, displaced.  Radiology report reviewed.  X-ray  chest unremarkable.   PROCEDURES:  Procedures   MEDICATIONS ORDERED IN ED: Medications  lactated ringers bolus 1,000 mL (has no administration in time range)     IMPRESSION / MDM / ASSESSMENT AND PLAN / ED COURSE  I reviewed the triage vital signs and the nursing notes.  DDx: Hip fracture, UTI, pneumonia, anemia, AKI, electrolyte abnormality, intracranial hemorrhage, C-spine fracture  Patient's presentation is most consistent with acute presentation with potential threat  to life or bodily function.  Patient brought to the ED after fall out of her wheelchair and increased confusion.  No visible trauma but right limb is notably shortened.  X-rays reveal right intertrochanteric fracture, comminuted.  CT head and cervical spine unremarkable.  Orthopedics notified, Dr. Audelia Acton will plan for operative repair tomorrow.  Will admit to hospitalist.       FINAL CLINICAL IMPRESSION(S) / ED DIAGNOSES   Final diagnoses:  Intertrochanteric fracture of right hip, closed, initial encounter  Chronic dementia     Rx / DC Orders   ED Discharge Orders     None        Note:  This document was prepared using Dragon voice recognition software and may include unintentional dictation errors.   Sharman Cheek, MD 05/22/22 1410

## 2022-05-22 NOTE — ED Notes (Signed)
Advised nurse that patient has ready bed 

## 2022-05-23 ENCOUNTER — Encounter: Payer: Self-pay | Admitting: Internal Medicine

## 2022-05-23 ENCOUNTER — Inpatient Hospital Stay: Payer: PPO

## 2022-05-23 ENCOUNTER — Inpatient Hospital Stay: Payer: PPO | Admitting: Anesthesiology

## 2022-05-23 ENCOUNTER — Other Ambulatory Visit: Payer: Self-pay

## 2022-05-23 ENCOUNTER — Encounter
Admission: EM | Disposition: A | Discharge status: Skilled Nursing Facility | Payer: Self-pay | Source: Home / Self Care | Attending: Student

## 2022-05-23 DIAGNOSIS — S72141A Displaced intertrochanteric fracture of right femur, initial encounter for closed fracture: Principal | ICD-10-CM

## 2022-05-23 DIAGNOSIS — S72001A Fracture of unspecified part of neck of right femur, initial encounter for closed fracture: Secondary | ICD-10-CM | POA: Diagnosis not present

## 2022-05-23 HISTORY — PX: INTRAMEDULLARY (IM) NAIL INTERTROCHANTERIC: SHX5875

## 2022-05-23 LAB — IRON AND TIBC
Iron: 32 ug/dL (ref 28–170)
Saturation Ratios: 12 % (ref 10.4–31.8)
TIBC: 273 ug/dL (ref 250–450)
UIBC: 241 ug/dL

## 2022-05-23 LAB — PHOSPHORUS: Phosphorus: 3.9 mg/dL (ref 2.5–4.6)

## 2022-05-23 LAB — CBC
HCT: 29.8 % — ABNORMAL LOW (ref 36.0–46.0)
Hemoglobin: 10.5 g/dL — ABNORMAL LOW (ref 12.0–15.0)
MCH: 29.8 pg (ref 26.0–34.0)
MCHC: 35.2 g/dL (ref 30.0–36.0)
MCV: 84.7 fL (ref 80.0–100.0)
Platelets: 203 10*3/uL (ref 150–400)
RBC: 3.52 MIL/uL — ABNORMAL LOW (ref 3.87–5.11)
RDW: 14.2 % (ref 11.5–15.5)
WBC: 11.4 10*3/uL — ABNORMAL HIGH (ref 4.0–10.5)
nRBC: 0 % (ref 0.0–0.2)

## 2022-05-23 LAB — MAGNESIUM: Magnesium: 1.9 mg/dL (ref 1.7–2.4)

## 2022-05-23 LAB — URINALYSIS, W/ REFLEX TO CULTURE (INFECTION SUSPECTED)
Bilirubin Urine: NEGATIVE
Glucose, UA: NEGATIVE mg/dL
Hgb urine dipstick: NEGATIVE
Ketones, ur: 5 mg/dL — AB
Leukocytes,Ua: NEGATIVE
Nitrite: NEGATIVE
Protein, ur: NEGATIVE mg/dL
Specific Gravity, Urine: 1.017 (ref 1.005–1.030)
pH: 5 (ref 5.0–8.0)

## 2022-05-23 LAB — BASIC METABOLIC PANEL
Anion gap: 10 (ref 5–15)
BUN: 16 mg/dL (ref 8–23)
CO2: 22 mmol/L (ref 22–32)
Calcium: 8.1 mg/dL — ABNORMAL LOW (ref 8.9–10.3)
Chloride: 104 mmol/L (ref 98–111)
Creatinine, Ser: 0.8 mg/dL (ref 0.44–1.00)
GFR, Estimated: >60 mL/min (ref >60)
Glucose, Bld: 106 mg/dL — ABNORMAL HIGH (ref 70–99)
Potassium: 3.2 mmol/L — ABNORMAL LOW (ref 3.5–5.1)
Sodium: 136 mmol/L (ref 135–145)

## 2022-05-23 LAB — ABO/RH: ABO/RH(D): O NEG

## 2022-05-23 LAB — VITAMIN D 25 HYDROXY (VIT D DEFICIENCY, FRACTURES): Vit D, 25-Hydroxy: 13.81 ng/mL — ABNORMAL LOW (ref 30–100)

## 2022-05-23 LAB — SURGICAL PCR SCREEN
MRSA, PCR: NEGATIVE
Staphylococcus aureus: NEGATIVE

## 2022-05-23 LAB — FOLATE: Folate: 9 ng/mL (ref >5.9)

## 2022-05-23 SURGERY — FIXATION, FRACTURE, INTERTROCHANTERIC, WITH INTRAMEDULLARY ROD
Anesthesia: General | Laterality: Right

## 2022-05-23 MED ORDER — ACETAMINOPHEN 325 MG PO TABS
325.0000 mg | ORAL_TABLET | Freq: Four times a day (QID) | ORAL | Status: DC | PRN
  Administered 2022-05-25 – 2022-05-31 (×6): 650 mg via ORAL
  Filled 2022-05-23 (×7): qty 2

## 2022-05-23 MED ORDER — ACETAMINOPHEN 500 MG PO TABS
500.0000 mg | ORAL_TABLET | Freq: Four times a day (QID) | ORAL | Status: AC
  Administered 2022-05-23 – 2022-05-24 (×3): 500 mg via ORAL
  Filled 2022-05-23 (×4): qty 1

## 2022-05-23 MED ORDER — PHENYLEPHRINE HCL-NACL 20-0.9 MG/250ML-% IV SOLN
INTRAVENOUS | Status: AC
  Filled 2022-05-23: qty 250

## 2022-05-23 MED ORDER — DEXAMETHASONE SODIUM PHOSPHATE 10 MG/ML IJ SOLN
INTRAMUSCULAR | Status: DC | PRN
  Administered 2022-05-23: 10 mg via INTRAVENOUS

## 2022-05-23 MED ORDER — ONDANSETRON HCL 4 MG PO TABS: 4.0000 mg | ORAL_TABLET | Freq: Four times a day (QID) | ORAL | Status: DC | PRN

## 2022-05-23 MED ORDER — CEFAZOLIN SODIUM-DEXTROSE 2-4 GM/100ML-% IV SOLN
2.0000 g | Freq: Four times a day (QID) | INTRAVENOUS | Status: AC
  Administered 2022-05-23 (×2): 2 g via INTRAVENOUS
  Filled 2022-05-23 (×2): qty 100

## 2022-05-23 MED ORDER — PHENOL 1.4 % MT LIQD: 1.0000 | OROMUCOSAL | Status: DC | PRN

## 2022-05-23 MED ORDER — BUPIVACAINE HCL (PF) 0.25 % IJ SOLN
INTRAMUSCULAR | Status: AC
  Filled 2022-05-23: qty 30

## 2022-05-23 MED ORDER — MORPHINE SULFATE (PF) 2 MG/ML IV SOLN: 0.5000 mg | INTRAVENOUS | Status: DC | PRN

## 2022-05-23 MED ORDER — HYDROCODONE-ACETAMINOPHEN 5-325 MG PO TABS
1.0000 | ORAL_TABLET | ORAL | Status: DC | PRN
  Administered 2022-05-27 – 2022-05-29 (×2): 2 via ORAL
  Administered 2022-05-30: 1 via ORAL
  Filled 2022-05-23 (×2): qty 2
  Filled 2022-05-23: qty 1

## 2022-05-23 MED ORDER — LIDOCAINE HCL (CARDIAC) PF 100 MG/5ML IV SOSY
PREFILLED_SYRINGE | INTRAVENOUS | Status: DC | PRN
  Administered 2022-05-23: 60 mg via INTRAVENOUS

## 2022-05-23 MED ORDER — DOCUSATE SODIUM 100 MG PO CAPS
100.0000 mg | ORAL_CAPSULE | Freq: Two times a day (BID) | ORAL | Status: DC
  Administered 2022-05-23 – 2022-06-01 (×19): 100 mg via ORAL
  Filled 2022-05-23 (×19): qty 1

## 2022-05-23 MED ORDER — TRAMADOL HCL 50 MG PO TABS
50.0000 mg | ORAL_TABLET | Freq: Four times a day (QID) | ORAL | Status: DC | PRN
  Administered 2022-05-23 – 2022-05-29 (×6): 50 mg via ORAL
  Filled 2022-05-23 (×8): qty 1

## 2022-05-23 MED ORDER — GLYCOPYRROLATE 0.2 MG/ML IJ SOLN
INTRAMUSCULAR | Status: DC | PRN
  Administered 2022-05-23: .2 mg via INTRAVENOUS

## 2022-05-23 MED ORDER — ENOXAPARIN SODIUM 40 MG/0.4ML IJ SOSY
40.0000 mg | PREFILLED_SYRINGE | INTRAMUSCULAR | Status: DC
  Administered 2022-05-24: 40 mg via SUBCUTANEOUS
  Filled 2022-05-23: qty 0.4

## 2022-05-23 MED ORDER — PROPOFOL 10 MG/ML IV BOLUS
INTRAVENOUS | Status: DC | PRN
  Administered 2022-05-23: 100 mg via INTRAVENOUS

## 2022-05-23 MED ORDER — OXYCODONE HCL 5 MG/5ML PO SOLN: 5.0000 mg | Freq: Once | ORAL | Status: DC | PRN

## 2022-05-23 MED ORDER — TRANEXAMIC ACID-NACL 1000-0.7 MG/100ML-% IV SOLN
INTRAVENOUS | Status: DC | PRN
  Administered 2022-05-23: 1000 mg via INTRAVENOUS

## 2022-05-23 MED ORDER — ONDANSETRON HCL 4 MG/2ML IJ SOLN: 4.0000 mg | Freq: Four times a day (QID) | INTRAMUSCULAR | Status: DC | PRN

## 2022-05-23 MED ORDER — BUPIVACAINE-EPINEPHRINE (PF) 0.25% -1:200000 IJ SOLN
INTRAMUSCULAR | Status: DC | PRN
  Administered 2022-05-23: 30 mL

## 2022-05-23 MED ORDER — FENTANYL CITRATE (PF) 100 MCG/2ML IJ SOLN
INTRAMUSCULAR | Status: AC
  Filled 2022-05-23: qty 2

## 2022-05-23 MED ORDER — 0.9 % SODIUM CHLORIDE (POUR BTL) OPTIME
TOPICAL | Status: DC | PRN
  Administered 2022-05-23: 500 mL

## 2022-05-23 MED ORDER — EPHEDRINE SULFATE (PRESSORS) 50 MG/ML IJ SOLN
INTRAMUSCULAR | Status: DC | PRN
  Administered 2022-05-23: 5 mg via INTRAVENOUS

## 2022-05-23 MED ORDER — CEFAZOLIN SODIUM-DEXTROSE 2-4 GM/100ML-% IV SOLN
INTRAVENOUS | Status: AC
  Filled 2022-05-23: qty 100

## 2022-05-23 MED ORDER — CEFAZOLIN SODIUM-DEXTROSE 2-3 GM-%(50ML) IV SOLR
INTRAVENOUS | Status: DC | PRN
  Administered 2022-05-23: 2 g via INTRAVENOUS

## 2022-05-23 MED ORDER — ADULT MULTIVITAMIN W/MINERALS CH
1.0000 | ORAL_TABLET | Freq: Every day | ORAL | Status: DC
  Administered 2022-05-24 – 2022-06-01 (×9): 1 via ORAL
  Filled 2022-05-23 (×9): qty 1

## 2022-05-23 MED ORDER — ACETAMINOPHEN 10 MG/ML IV SOLN
INTRAVENOUS | Status: DC | PRN
  Administered 2022-05-23: 750 mg via INTRAVENOUS

## 2022-05-23 MED ORDER — OXYCODONE HCL 5 MG PO TABS: 5.0000 mg | ORAL_TABLET | Freq: Once | ORAL | Status: DC | PRN

## 2022-05-23 MED ORDER — LACTATED RINGERS IV SOLN: INTRAVENOUS | Status: DC | PRN

## 2022-05-23 MED ORDER — ACETAMINOPHEN 10 MG/ML IV SOLN
INTRAVENOUS | Status: AC
  Filled 2022-05-23: qty 100

## 2022-05-23 MED ORDER — FENTANYL CITRATE (PF) 100 MCG/2ML IJ SOLN
INTRAMUSCULAR | Status: DC | PRN
  Administered 2022-05-23: 50 ug via INTRAVENOUS

## 2022-05-23 MED ORDER — TRANEXAMIC ACID-NACL 1000-0.7 MG/100ML-% IV SOLN
INTRAVENOUS | Status: AC
  Filled 2022-05-23: qty 100

## 2022-05-23 MED ORDER — PHENYLEPHRINE HCL (PRESSORS) 10 MG/ML IV SOLN
INTRAVENOUS | Status: DC | PRN
  Administered 2022-05-23: 160 ug via INTRAVENOUS
  Administered 2022-05-23 (×2): 120 ug via INTRAVENOUS

## 2022-05-23 MED ORDER — MENTHOL 3 MG MT LOZG: 1.0000 | LOZENGE | OROMUCOSAL | Status: DC | PRN

## 2022-05-23 MED ORDER — FENTANYL CITRATE (PF) 100 MCG/2ML IJ SOLN: 25.0000 ug | INTRAMUSCULAR | Status: DC | PRN

## 2022-05-23 MED ORDER — METOCLOPRAMIDE HCL 10 MG PO TABS: 5.0000 mg | ORAL_TABLET | Freq: Three times a day (TID) | ORAL | Status: DC | PRN

## 2022-05-23 MED ORDER — SUGAMMADEX SODIUM 200 MG/2ML IV SOLN
INTRAVENOUS | Status: DC | PRN
  Administered 2022-05-23: 200 mg via INTRAVENOUS

## 2022-05-23 MED ORDER — POTASSIUM CHLORIDE CRYS ER 20 MEQ PO TBCR
40.0000 meq | EXTENDED_RELEASE_TABLET | Freq: Once | ORAL | Status: DC
  Filled 2022-05-23: qty 2

## 2022-05-23 MED ORDER — EPINEPHRINE PF 1 MG/ML IJ SOLN
INTRAMUSCULAR | Status: AC
  Filled 2022-05-23: qty 1

## 2022-05-23 MED ORDER — PHENYLEPHRINE HCL-NACL 20-0.9 MG/250ML-% IV SOLN
INTRAVENOUS | Status: DC | PRN
  Administered 2022-05-23: 25 ug/min via INTRAVENOUS

## 2022-05-23 MED ORDER — ROCURONIUM BROMIDE 100 MG/10ML IV SOLN
INTRAVENOUS | Status: DC | PRN
  Administered 2022-05-23: 40 mg via INTRAVENOUS

## 2022-05-23 MED ORDER — ONDANSETRON HCL 4 MG/2ML IJ SOLN
INTRAMUSCULAR | Status: DC | PRN
  Administered 2022-05-23 (×2): 4 mg via INTRAVENOUS

## 2022-05-23 MED ORDER — METOCLOPRAMIDE HCL 5 MG/ML IJ SOLN: 5.0000 mg | Freq: Three times a day (TID) | INTRAMUSCULAR | Status: DC | PRN

## 2022-05-23 SURGICAL SUPPLY — 41 items
BIT DRILL CANN 16 HIP (BIT) IMPLANT
BIT DRILL CANN STP 6/9 HIP (BIT) IMPLANT
BIT DRILL LONG 4.2 (BIT) IMPLANT
BIT DRILL TAPERED 10 (BIT) IMPLANT
BLADE TFNA HELICAL 95 NON STRL (Anchor) IMPLANT
BNDG COHESIVE 6X5 TAN ST LF (GAUZE/BANDAGES/DRESSINGS) ×1 IMPLANT
CHLORAPREP W/TINT 26 (MISCELLANEOUS) ×1 IMPLANT
DERMABOND ADVANCED .7 DNX12 (GAUZE/BANDAGES/DRESSINGS) ×1 IMPLANT
DRAPE 3/4 80X56 (DRAPES) ×1 IMPLANT
DRSG OPSITE POSTOP 3X4 (GAUZE/BANDAGES/DRESSINGS) IMPLANT
DRSG OPSITE POSTOP 4X6 (GAUZE/BANDAGES/DRESSINGS) IMPLANT
ELECT CAUTERY BLADE 6.4 (BLADE) IMPLANT
ELECT REM PT RETURN 9FT ADLT (ELECTROSURGICAL)
ELECTRODE REM PT RTRN 9FT ADLT (ELECTROSURGICAL) IMPLANT
GLOVE PI ORTHO PRO STRL 7.5 (GLOVE) ×2 IMPLANT
GLOVE SURG SYN 7.5  E (GLOVE) ×1
GLOVE SURG SYN 7.5 E (GLOVE) ×1 IMPLANT
GLOVE SURG SYN 7.5 PF PI (GLOVE) ×1 IMPLANT
GOWN SRG XL LVL 3 NONREINFORCE (GOWNS) ×1 IMPLANT
GOWN STRL NON-REIN TWL XL LVL3 (GOWNS) ×1
GOWN STRL REUS W/ TWL LRG LVL3 (GOWN DISPOSABLE) ×1 IMPLANT
GOWN STRL REUS W/TWL LRG LVL3 (GOWN DISPOSABLE) ×1
GUIDEWIRE 3.2X400 (WIRE) IMPLANT
HANDLE YANKAUER SUCT OPEN TIP (MISCELLANEOUS) ×1 IMPLANT
IMPL DEG TI CANN 11MM/130 (Orthopedic Implant) IMPLANT
IMPLANT DEG TI CANN 11MM/130 (Orthopedic Implant) ×1 IMPLANT
KIT PATIENT CARE HANA TABLE (KITS) ×1 IMPLANT
MANIFOLD NEPTUNE II (INSTRUMENTS) ×1 IMPLANT
MAT ABSORB  FLUID 56X50 GRAY (MISCELLANEOUS) ×1
MAT ABSORB FLUID 56X50 GRAY (MISCELLANEOUS) ×1 IMPLANT
NS IRRIG 500ML POUR BTL (IV SOLUTION) ×1 IMPLANT
PACK HIP COMPR (MISCELLANEOUS) ×1 IMPLANT
SCREW LOCK STAR 5X30 (Screw) IMPLANT
SLEEVE SCD COMPRESS KNEE MED (STOCKING) ×1 IMPLANT
SUT QUILL MONODERM 3-0 PS-2 (SUTURE) ×1 IMPLANT
SUT VIC AB 1 CT1 36 (SUTURE) ×1 IMPLANT
SUT VIC AB 2-0 CT2 27 (SUTURE) ×1 IMPLANT
SYR 30ML LL (SYRINGE) ×1 IMPLANT
TAPE CLOTH 3X10 WHT NS LF (GAUZE/BANDAGES/DRESSINGS) IMPLANT
TRAP FLUID SMOKE EVACUATOR (MISCELLANEOUS) IMPLANT
WATER STERILE IRR 500ML POUR (IV SOLUTION) ×1 IMPLANT
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
## 2022-05-23 NOTE — Progress Notes (Signed)
Initial Nutrition Assessment  DOCUMENTATION CODES:   Severe malnutrition in context of chronic illness  INTERVENTION:   -Once diet is advanced, add:   -Ensure Enlive po BID, each supplement provides 350 kcal and 20 grams of protein -MVI with minerals daily  NUTRITION DIAGNOSIS:   Severe Malnutrition related to chronic illness (dementia, CHF) as evidenced by moderate fat depletion, severe fat depletion, moderate muscle depletion, severe muscle depletion.  GOAL:   Patient will meet greater than or equal to 90% of their needs  MONITOR:   PO intake, Supplement acceptance, Diet advancement  REASON FOR ASSESSMENT:   Consult Assessment of nutrition requirement/status, Hip fracture protocol  ASSESSMENT:   Pt with medical history significant of dementia, HTN, dCHF, hypothyroidism, depression with anxiety, GERD, breast cancer (s/p of radiation and chemotherapy), who presents with fall, right hip pain and confusion.  Pt admitted with closed rt hip fracture.   Pt pleasantly confused at time of visit, lying in bed. No family at bedside. Pt answered questions, but unable to provide accurate history secondary to confusion. When RD asked pt if she was feeling well, she replied "Did I feel bad yesterday?". PTA, pt reports that she "lives at home with momma and daddy".   Pt is currently NPO for upcoming surgery today.   Reviewed wt hx; pt has experienced a 1.3% wt loss over the past 3 months, which is not significant for time frame.   Pt with increased nutritional needs for malnutrition and post-op healing and would greatly benefit from addition of oral nutrition supplements.   Medications reviewed and include vitamin B-12 and potassium chloride.   No results found for: "HGBA1C" PTA DM medications are none.   Labs reviewed: K: 3.2, Mg: 1.6, CBGS: 105 (inpatient orders for glycemic control are none).     NUTRITION - FOCUSED PHYSICAL EXAM:  Flowsheet Row Most Recent Value  Orbital  Region Severe depletion  Upper Arm Region Severe depletion  Thoracic and Lumbar Region Mild depletion  Buccal Region Moderate depletion  Temple Region Moderate depletion  Clavicle Bone Region Severe depletion  Clavicle and Acromion Bone Region Severe depletion  Scapular Bone Region Severe depletion  Dorsal Hand Severe depletion  Patellar Region Mild depletion  Anterior Thigh Region Mild depletion  Posterior Calf Region Mild depletion  Edema (RD Assessment) None  Hair Reviewed  Eyes Reviewed  Mouth Reviewed  Skin Reviewed  Nails Reviewed       Diet Order:   Diet Order             Diet NPO time specified Except for: Sips with Meds, Ice Chips  Diet effective midnight                   EDUCATION NEEDS:   Not appropriate for education at this time  Skin:  Skin Assessment: Reviewed RN Assessment  Last BM:  Unknown  Height:   Ht Readings from Last 1 Encounters:  05/22/22  (1.473 m)    Weight:   Wt Readings from Last 1 Encounters:  05/22/22 43.9 kg    Ideal Body Weight:  43.9 kg  BMI:  Body mass index is 20.21 kg/m.  Estimated Nutritional Needs:   Kcal:  1350-1550  Protein:  70-85 grams  Fluid:  > 1.3 L    Levada Schilling, RD, LDN, CDCES Registered Dietitian II Certified Diabetes Care and Education Specialist Please refer to [facility] for RD and/or RD on-call/weekend/after hours pager

## 2022-05-23 NOTE — Plan of Care (Signed)

## 2022-05-23 NOTE — Anesthesia Postprocedure Evaluation (Signed)
Anesthesia Post Note  Patient: GLENDER AUGUSTA  Procedure(s) Performed: INTRAMEDULLARY (IM) NAIL INTERTROCHANTERIC (Right)  Patient location during evaluation: PACU Anesthesia Type: General Level of consciousness: awake and alert Pain management: pain level controlled Vital Signs Assessment: post-procedure vital signs reviewed and stable Respiratory status: spontaneous breathing, nonlabored ventilation, respiratory function stable and patient connected to nasal cannula oxygen Cardiovascular status: blood pressure returned to baseline and stable Postop Assessment: no apparent nausea or vomiting Anesthetic complications: no   No notable events documented.   Last Vitals:  Vitals:   05/23/22 1330 05/23/22 1400  BP:  (!) 147/78  Pulse:  75  Resp:  16  Temp: 36.5 C (!) 36.3 C  SpO2:  98%    Last Pain:  Vitals:   05/23/22 1104  TempSrc: Temporal  PainSc: 0-No pain                 Cleda Mccreedy Eulanda Dorion

## 2022-05-23 NOTE — Interval H&P Note (Signed)
Patient history and physical updated. Consent reviewed including risks, benefits, and alternatives to surgery. Patient's daughter agrees with above plan to proceed with right hip intramedullary nail.
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
## 2022-05-23 NOTE — Plan of Care (Signed)
  Problem: Education: Goal: Knowledge of General Education information will improve Description: Including pain rating scale, medication(s)/side effects and non-pharmacologic comfort measures Outcome: Not Progressing   Problem: Health Behavior/Discharge Planning: Goal: Ability to manage health-related needs will improve Outcome: Not Progressing   Problem: Clinical Measurements: Goal: Ability to maintain clinical measurements within normal limits will improve Outcome: Not Progressing Goal: Will remain free from infection Outcome: Not Progressing Goal: Diagnostic test results will improve Outcome: Not Progressing Goal: Respiratory complications will improve Outcome: Not Progressing Goal: Cardiovascular complication will be avoided Outcome: Not Progressing   Problem: Activity: Goal: Risk for activity intolerance will decrease Outcome: Not Progressing   Problem: Nutrition: Goal: Adequate nutrition will be maintained Outcome: Not Progressing   Problem: Coping: Goal: Level of anxiety will decrease Outcome: Not Progressing   Problem: Elimination: Goal: Will not experience complications related to bowel motility Outcome: Not Progressing Goal: Will not experience complications related to urinary retention Outcome: Not Progressing   Problem: Pain Managment: Goal: General experience of comfort will improve Outcome: Not Progressing   Problem: Safety: Goal: Ability to remain free from injury will improve Outcome: Not Progressing   Problem: Skin Integrity: Goal: Risk for impaired skin integrity will decrease Outcome: Not Progressing   Problem: Clinical Measurements: Goal: Postoperative complications will be avoided or minimized Outcome: Not Progressing   Problem: Pain Management: Goal: Pain level will decrease Outcome: Not Progressing   

## 2022-05-23 NOTE — Progress Notes (Signed)
[facility] Progress Note  Patient: Lisa Martin    OZD:[id number]  DOA: 05/22/2022     Date of Service: the patient was seen and examined on 05/23/2022  Chief Complaint  Patient presents with   Fall   Brief hospital course: Lisa Martin is a 89 y.o. female with medical history significant of dementia, HTN, dCHF, hypothyroidism, depression with anxiety, GERD, breast cancer (s/p of radiation and chemotherapy), who presents with fall, right hip pain and confusion. Per her daughter at bedside, pt fell accidentally when she was walking around her wheelchair at about 9:00 PM yesterday. No LOC. X-ray of right hip/pelvis: Acute comminuted intertrochanteric fracture of the proximal right femur with varus angulation. X-ray of right femur is negatve for femoral shaft fracture.  Patient is admitted to telemetry bed as inpatient.    Assessment and Plan:  Closed right hip fracture: X-ray of right hip/pelvis showed acute comminuted intertrochanteric fracture of the proximal right femur with varus angulation. Orthopedic surgeon, Dr. Audelia Acton is consulted,  Patient's GUPTA score perioperative myocardial infarction or cardaic arrest is 2.63 %.    Fall at home, initial encounter: - PT/OT when able to (not ordered now) -Fall precaution -f/u CK level   Chronic diastolic CHF (congestive heart failure): 2D echo on 12/11/2017 showed EF> 55% with grade 1 diastolic dysfunction.  Patient has mildly elevated BNP 204, but no leg edema or JVD.  No shortness of breath.  CHF is compensated. -Will volume status closely   Acute metabolic encephalopathy -Frequent neurochecks -Follow-up urinalysis   Hypertension -IV hydralazine as needed -Dilt-XR   Acquired hypothyroidism -Synthroid   Leukocytosis: WBC 12.7, no fever.  So far no source of infection identified, likely reactive. -Follow-up urinalysis   Right rib fracture: Chest x-ray showed subacute right lateral 9th and 10th rib fractures -Incentive  spirometry -Pain control   Dementia -Fall precaution -Namenda, Exelon   Depression and anxiety -Continue home medications   Protein-calorie malnutrition, severe: Body weight 40 kg, BMI 17.22 -Ensure -Nutrition consult    Body mass index is 20.23 kg/m.  Nutrition Problem: Severe Malnutrition Etiology: chronic illness (dementia, CHF) Interventions: Interventions: Ensure Enlive (each supplement provides 350kcal and 20 grams of protein), MVI      Diet: Regular diet DVT Prophylaxis: Subcutaneous Lovenox   Advance goals of care discussion: Full code  Family Communication: family was present at bedside, at the time of interview.  The pt provided permission to discuss medical plan with the family. Opportunity was given to ask question and all questions were answered satisfactorily.   Disposition:  Pt is from Home, admitted with Fall and right Hip fracture, patient will need SNF placement, which precludes a safe discharge. Discharge to SNF, TBD after PT/OT eval, can be discharged when bed will be available.   Subjective: No significant events overnight, patient was seen after right hip ORIF, sitting comfortably on the bed.  Denies any complaints. Patient's daughter was at bedside, management plan discussed.    Physical Exam: General: NAD, lying comfortably Appear in no distress, affect appropriate Eyes: PERRLA ENT: Oral Mucosa Clear, moist  Neck: no JVD,  Cardiovascular: S1 and S2 Present, no Murmur,  Respiratory: good respiratory effort, Bilateral Air entry equal and Decreased, no Crackles, no wheezes Abdomen: Bowel Sound present, Soft and no tenderness,  Skin: no rashes Extremities: no Pedal edema, no calf tenderness, right hip dressing CDI Neurologic: without any new focal findings Gait not checked due to patient safety concerns  Vitals:   05/23/22 1315 05/23/22  1330 05/23/22 1400 05/23/22 1446  BP: (!) 165/83  (!) 147/78 (!) 146/65  Pulse: 84  75 66  Resp: 20  16  16   Temp:  97.7 F (36.5 C) (!) 97.4 F (36.3 C) 98.1 F (36.7 C)  TempSrc:    Oral  SpO2: 98%  98% 96%  Weight:      Height:        Intake/Output Summary (Last 24 hours) at 05/23/2022 1530 Last data filed at 05/23/2022 1400 Gross per 24 hour  Intake 1150 ml  Output 100 ml  Net 1050 ml   Filed Weights   05/22/22 1648 05/23/22 1104  Weight: 43.9 kg 43.9 kg    Data Reviewed: I have personally reviewed and interpreted daily labs, tele strips, imagings as discussed above. I reviewed all nursing notes, pharmacy notes, vitals, pertinent old records I have discussed plan of care as described above with RN and patient/family.  CBC: Recent Labs  Lab 05/22/22 1306 05/23/22 0507  WBC 12.7* 11.4*  NEUTROABS 10.4*  --   HGB 12.2 10.5*  HCT 35.6* 29.8*  MCV 85.0 84.7  PLT 272 203   Basic Metabolic Panel: Recent Labs  Lab 05/22/22 1306 05/23/22 0507 05/23/22 0912  NA 134* 136  --   K 3.7 3.2*  --   CL 100 104  --   CO2 23 22  --   GLUCOSE 126* 106*  --   BUN 13 16  --   CREATININE 0.71 0.80  --   CALCIUM 8.8* 8.1*  --   MG  --   --  1.9  PHOS  --   --  3.9    Studies: DG HIP UNILAT WITH PELVIS 2-3 VIEWS RIGHT  Result Date: 05/23/2022 CLINICAL DATA:  Right IM nail fixation EXAM: DG HIP (WITH OR WITHOUT PELVIS) 3V RIGHT COMPARISON:  05/22/2022 FINDINGS: Six fluoroscopic images are obtained during the performance of the procedure and are provided for interpretation only. Images demonstrate intramedullary nail fixation of a previously noted intertrochanteric fracture Fluoroscopy time: 1 minute 27 seconds Cumulative air kerma: 10.84 mGy IMPRESSION: Fluoroscopic images obtained during intramedullary nail fixation of right intertrochanteric fracture. Electronically Signed   By: Wiliam Ke M.D.   On: 05/23/2022 13:38   DG C-Arm 1-60 Min-No Report  Result Date: 05/23/2022 Fluoroscopy was utilized by the requesting physician.  No radiographic interpretation.    Scheduled  Meds:  acetaminophen  500 mg Oral Q6H   cyanocobalamin  1,000 mcg Oral Daily   diltiazem  120 mg Oral Daily   docusate sodium  100 mg Oral BID   [START ON 05/24/2022] enoxaparin (LOVENOX) injection  40 mg Subcutaneous Q24H   feeding supplement  237 mL Oral BID BM   levothyroxine  88 mcg Oral Daily   lidocaine  1 patch Transdermal Q24H   memantine  5 mg Oral BID   [START ON 05/24/2022] multivitamin with minerals  1 tablet Oral Daily   pantoprazole  40 mg Oral Daily   potassium chloride  40 mEq Oral Once   QUEtiapine  25 mg Oral QHS   rivastigmine  3 mg Oral BID   sertraline  25 mg Oral Daily   Continuous Infusions:   ceFAZolin (ANCEF) IV     PRN Meds: [START ON 05/24/2022] acetaminophen, acetaminophen, albuterol, ALPRAZolam, hydrALAZINE, HYDROcodone-acetaminophen, menthol-cetylpyridinium **OR** phenol, methocarbamol, metoCLOPramide **OR** metoCLOPramide (REGLAN) injection, morphine injection, morphine injection, ondansetron (ZOFRAN) IV, ondansetron **OR** ondansetron (ZOFRAN) IV, oxyCODONE-acetaminophen, senna-docusate, traMADol  Time spent: 35 minutes  Author: Gillis Santa. MD [facility] 05/23/2022 3:30 PM  To reach On-call, see care teams to locate the attending and reach out to them via [email or web address]. If 7PM-7AM, please contact night-coverage If you still have difficulty reaching the attending provider, please page the [facility] (Director on Call) for [facility] on amion for assistance.

## 2022-05-23 NOTE — Transfer of Care (Signed)
Immediate Anesthesia Transfer of Care Note  Patient: Lisa Martin  Procedure(s) Performed: INTRAMEDULLARY (IM) NAIL INTERTROCHANTERIC (Right)  Patient Location: PACU  Anesthesia Type:General  Level of Consciousness: awake, drowsy, and patient cooperative  Airway & Oxygen Therapy: Patient Spontanous Breathing and Patient connected to face mask oxygen  Post-op Assessment: Report given to RN and Post -op Vital signs reviewed and stable  Post vital signs: Reviewed and stable  Last Vitals:  Vitals Value Taken Time  BP 165/83 05/23/22 1315  Temp    Pulse 85 05/23/22 1317  Resp 18 05/23/22 1317  SpO2 96 % 05/23/22 1317  Vitals shown include unvalidated device data.  Last Pain:  Vitals:   05/23/22 1104  TempSrc: Temporal  PainSc: 0-No pain         Complications: No notable events documented.

## 2022-05-23 NOTE — Op Note (Signed)
Patient Name: Lisa Martin  ZOX:[id number]  Pre-Operative Diagnosis: Right hip Intertrochanteric fracture  Post-Operative Diagnosis: (same)  Procedure: Right Hip Intramedullary nail   Components/Implants: Nail:Synthes TFNA 11mm x x 130deg  Lag blade: 95mm helical blade    Locking Screw:79mm   Date of Surgery: 05/23/2022  Surgeon: Reinaldo Berber MD  Assistant: Amador Cunas PA (present and scrubbed throughout the case, critical for assistance with exposure, retraction, instrumentation, and closure)   Anesthesiologist: Causey  Anesthesia: General   EBL: 75cc  IVF: 1000cc  Complications: None   Brief history: The patient is a 89 year old female who presented to the [facility] emergency room after a fall and found to have a  right hip intertrochanteric fracture.  The patient was admitted by the medical team and optimized for surgery.  A thorough discussion was had with the patient and family about the risks and benefits of surgical intervention for their hip fracture as definitive treatment.  The patient and family opted to proceed with the operation.  All preoperative films were reviewed and an appropriate surgical plan was made prior to surgery.   Description of procedure: The patient was brought to the operating room where laterality was confirmed by all those present to be the right side.  The patient was administered anesthesia on a stretcher prior to being moved supine on the operating room table. Patient was given an intravenous dose of antibiotics for surgical prophylaxis and TXA .  All bony prominences and extremities were well padded and the patient was securely attached to the table boots, a perineal post was placed and the patient had a safety strap placed.  Surgical site was prepped with alcohol and chlorhexidine. The surgical site over the hip was and draped in typical sterile fashion with multiple layers of adhesive and nonadhesive drapes.  The  incision site was marked out with a sterile marker under fluoroscopic guidance.    A surgical timeout was then called with participation of all staff in the room the patient was then a confirmed again and laterality confirmed.  An incision was made just proximal to the greater trochanter through the skin subcutaneous tissues and an incision was made in the glut max fascia.  A guidewire was placed through the greater trochanter at the tip under x-ray guidance into the intertrochanteric region.  The position of this wire was assessed on AP and lateral fluoroscopic images to ensure position.  An opening reamer was used to create access at the tip of the greater trochanter under fluoroscopic guidance.  A size 11mm x nail was advanced through the reamed hole in the proximal femur and seated within the femur to an appropriate depth under fluoroscopic guidance.  The aiming jig was used to mark an incision site for a lag screw to the femoral head which was then incised with a scalpel.  A wire was then advanced through the lateral cortex of the femur into the center of the femoral head on both AP and lateral fluoroscopic imaging stopping at the subchondral bone without penetration of the femoral head.  This wire was then used to measure for a helical blade and a size 95mm blade was inserted into the femoral head through the lateral cortex of the femur and the nail under fluoroscopic guidance.  The  setscrew in the proximal part of the nail was engaged with the blade with a small amount of play left so as to allow compression of the fracture. The fracture was carefully compressed  under fluoroscopic guidance.   The blade inserter handle was removed and the aiming jig was switched for the distal interlocking screw.  A drill was used to drill a hole for the distal interlocking screw under fluoroscopic guidance and a size 30mm screw was placed.  The intramedullary nail was assessed on AP and lateral fluoroscopic  guidance prior to removal of the aiming jig.  Final fluoroscopic x-rays were then taken after removal of the jig.  The nail was found to be in appropriate position on AP and lateral imaging with appropriate lengths of both the lag screw in the distal locking screw.  The fascia was closed with #1 Vicryl interrupted figure-of-eight sutures.  The subcutaneous tissues were closed with 2-0 Vicryl and the skin closed with 3-0 monoderm quill and Dermabond.  Sterile dressings were applied to the incisions.   The patient was awoken from anesthesia transferred off of the operating room table onto a hospital bed.  The patient had a good pulse postoperatively in the foot . the patient was then transferred to the PACU in stable condition.

## 2022-05-23 NOTE — Anesthesia Procedure Notes (Signed)
Procedure Name: Intubation Date/Time: 05/23/2022 11:57 AM  Performed by: Mohammed Kindle, CRNAPre-anesthesia Checklist: Patient identified, Emergency Drugs available, Suction available and Patient being monitored Patient Re-evaluated:Patient Re-evaluated prior to induction Oxygen Delivery Method: Circle system utilized Preoxygenation: Pre-oxygenation with 100% oxygen Induction Type: IV induction Ventilation: Mask ventilation without difficulty Laryngoscope Size: McGraph and 3 Grade View: Grade I Tube type: Oral Tube size: 6.0 mm Number of attempts: 1 Airway Equipment and Method: Stylet and Oral airway Placement Confirmation: ETT inserted through vocal cords under direct vision, positive ETCO2, breath sounds checked- equal and bilateral and CO2 detector Secured at: 21 cm Tube secured with: Tape Dental Injury: Teeth and Oropharynx as per pre-operative assessment

## 2022-05-23 NOTE — Plan of Care (Signed)
  Problem: Education: Goal: Knowledge of General Education information will improve Description: Including pain rating scale, medication(s)/side effects and non-pharmacologic comfort measures Outcome: Not Progressing   Problem: Health Behavior/Discharge Planning: Goal: Ability to manage health-related needs will improve Outcome: Not Progressing   Problem: Clinical Measurements: Goal: Ability to maintain clinical measurements within normal limits will improve Outcome: Not Progressing Goal: Will remain free from infection Outcome: Not Progressing Goal: Diagnostic test results will improve Outcome: Not Progressing Goal: Respiratory complications will improve Outcome: Not Progressing Goal: Cardiovascular complication will be avoided Outcome: Not Progressing   Problem: Activity: Goal: Risk for activity intolerance will decrease Outcome: Not Progressing   Problem: Nutrition: Goal: Adequate nutrition will be maintained Outcome: Not Progressing   Problem: Coping: Goal: Level of anxiety will decrease Outcome: Not Progressing   Problem: Elimination: Goal: Will not experience complications related to bowel motility Outcome: Not Progressing Goal: Will not experience complications related to urinary retention Outcome: Not Progressing   Problem: Pain Managment: Goal: General experience of comfort will improve Outcome: Not Progressing   Problem: Safety: Goal: Ability to remain free from injury will improve Outcome: Not Progressing   Problem: Skin Integrity: Goal: Risk for impaired skin integrity will decrease Outcome: Not Progressing   Problem: Clinical Measurements: Goal: Postoperative complications will be avoided or minimized Outcome: Not Progressing   Problem: Pain Management: Goal: Pain level will decrease Outcome: Not Progressing

## 2022-05-24 ENCOUNTER — Encounter: Payer: Self-pay | Admitting: Orthopedic Surgery

## 2022-05-24 DIAGNOSIS — S72001A Fracture of unspecified part of neck of right femur, initial encounter for closed fracture: Secondary | ICD-10-CM | POA: Diagnosis not present

## 2022-05-24 LAB — BASIC METABOLIC PANEL
Anion gap: 9 (ref 5–15)
BUN: 21 mg/dL (ref 8–23)
CO2: 24 mmol/L (ref 22–32)
Calcium: 8.3 mg/dL — ABNORMAL LOW (ref 8.9–10.3)
Chloride: 98 mmol/L (ref 98–111)
Creatinine, Ser: 0.89 mg/dL (ref 0.44–1.00)
GFR, Estimated: >60 mL/min (ref >60)
Glucose, Bld: 183 mg/dL — ABNORMAL HIGH (ref 70–99)
Potassium: 3.1 mmol/L — ABNORMAL LOW (ref 3.5–5.1)
Sodium: 131 mmol/L — ABNORMAL LOW (ref 135–145)

## 2022-05-24 LAB — CBC
HCT: 27.2 % — ABNORMAL LOW (ref 36.0–46.0)
Hemoglobin: 9.9 g/dL — ABNORMAL LOW (ref 12.0–15.0)
MCH: 30 pg (ref 26.0–34.0)
MCHC: 36.4 g/dL — ABNORMAL HIGH (ref 30.0–36.0)
MCV: 82.4 fL (ref 80.0–100.0)
Platelets: 229 10*3/uL (ref 150–400)
RBC: 3.3 MIL/uL — ABNORMAL LOW (ref 3.87–5.11)
RDW: 13.7 % (ref 11.5–15.5)
WBC: 21.4 10*3/uL — ABNORMAL HIGH (ref 4.0–10.5)
nRBC: 0 % (ref 0.0–0.2)

## 2022-05-24 LAB — PHOSPHORUS: Phosphorus: 2.8 mg/dL (ref 2.5–4.6)

## 2022-05-24 LAB — MAGNESIUM: Magnesium: 2 mg/dL (ref 1.7–2.4)

## 2022-05-24 MED ORDER — VITAMIN D (ERGOCALCIFEROL) 1.25 MG (50000 UNIT) PO CAPS
50000.0000 [IU] | ORAL_CAPSULE | ORAL | Status: DC
  Administered 2022-05-24 – 2022-05-31 (×2): 50000 [IU] via ORAL
  Filled 2022-05-24 (×2): qty 1

## 2022-05-24 MED ORDER — POTASSIUM CHLORIDE CRYS ER 20 MEQ PO TBCR: 40.0000 meq | EXTENDED_RELEASE_TABLET | Freq: Once | ORAL | Status: DC

## 2022-05-24 MED ORDER — ENOXAPARIN SODIUM 30 MG/0.3ML IJ SOSY
30.0000 mg | PREFILLED_SYRINGE | INTRAMUSCULAR | Status: DC
  Administered 2022-05-25: 30 mg via SUBCUTANEOUS
  Filled 2022-05-24: qty 0.3

## 2022-05-24 MED ORDER — POTASSIUM CHLORIDE 10 MEQ/100ML IV SOLN
10.0000 meq | INTRAVENOUS | Status: AC
  Administered 2022-05-24: 10 meq via INTRAVENOUS

## 2022-05-24 MED ORDER — POTASSIUM CHLORIDE 10 MEQ/100ML IV SOLN
10.0000 meq | INTRAVENOUS | Status: AC
  Administered 2022-05-24 (×3): 10 meq via INTRAVENOUS
  Filled 2022-05-24 (×4): qty 100

## 2022-05-24 MED ORDER — POTASSIUM CHLORIDE 20 MEQ PO PACK
40.0000 meq | PACK | Freq: Once | ORAL | Status: AC
  Administered 2022-05-24: 40 meq via ORAL
  Filled 2022-05-24: qty 2

## 2022-05-24 NOTE — Progress Notes (Signed)
Physical Therapy Treatment Patient Details Name: Lisa Martin MRN: [id number] DOB: 11-04-1931 Today's Date: 05/24/2022   History of Present Illness Pt is an 89 y.o. female with medical history significant of dementia, HTN, dCHF, hypothyroidism, depression with anxiety, GERD, breast cancer (s/p of radiation and chemotherapy), who presents with fall with right hip pain and confusion. Pt diagnosed with right hip Intertrochanteric fracture and is s/p IM nail.  MD assessment also includes acute metabolic encephalopathy, HTN, R rib fracture, and severe protein-calorie malnutrition.    PT Comments    Pt pleasantly confused and required extensive multi-modal cues to follow most commands.  Pt demonstrated very poor standing balance most notably initially upon coming to standing with heavy posterior lean.  Once corrected pt was able to take several steps near the chair and then to the bed with notably decreased stance time on the RLE and poor stability.  Pt is at a very high risk for falls and would benefit from 24/7 supervision and continued PT services upon discharge to safely address deficits listed in patient problem list for decreased caregiver assistance and eventual return to PLOF.     Recommendations for follow up therapy are one component of a multi-disciplinary discharge planning process, led by the attending physician.  Recommendations may be updated based on patient status, additional functional criteria and insurance authorization.  Follow Up Recommendations  Can patient physically be transported by private vehicle: Yes    Assistance Recommended at Discharge Frequent or constant Supervision/Assistance  Patient can return home with the following A lot of help with walking and/or transfers;A lot of help with bathing/dressing/bathroom;Assistance with cooking/housework;Assistance with feeding;Direct supervision/assist for medications management;Direct supervision/assist for financial  management;Assist for transportation;Help with stairs or ramp for entrance   Equipment Recommendations  Other (comment) (TBD)    Recommendations for Other Services       Precautions / Restrictions Precautions Precautions: Fall Restrictions Weight Bearing Restrictions: Yes RLE Weight Bearing: Weight bearing as tolerated     Mobility  Bed Mobility Overal bed mobility: Needs Assistance Bed Mobility: Sit to Supine     Supine to sit: Min assist Sit to supine: Mod assist   General bed mobility comments: Max multi-modal cuing for sequencing with Mod A for BLE and trunk control during sit to sup    Transfers Overall transfer level: Needs assistance Equipment used: 2 person hand held assist Transfers: Sit to/from Stand Sit to Stand: Mod assist           General transfer comment: Mod A to prevent posterior LOB    Ambulation/Gait Ambulation/Gait assistance: Mod assist, +2 physical assistance Gait Distance (Feet): 5 Feet Assistive device: 2 person hand held assist Gait Pattern/deviations: Step-through pattern, Decreased step length - left, Decreased stance time - right, Antalgic Gait velocity: decreased     General Gait Details: Heavy lean on BUEs with +2 HHA and Mod A for stability; pt antalgic on the RLE with decreased stance time   Stairs             Wheelchair Mobility    Modified Rankin (Stroke Patients Only)       Balance Overall balance assessment: Needs assistance, History of Falls   Sitting balance-Leahy Scale: Poor Sitting balance - Comments: Frequent posterior lean in sitting but likely secondary to confusion with pt seeming to be attempting to lie down Postural control: Posterior lean Standing balance support: Bilateral upper extremity supported, During functional activity Standing balance-Leahy Scale: Poor Standing balance comment: Frequent assist to prevent LOB  Cognition Arousal/Alertness:  Awake/alert Behavior During Therapy: Impulsive Overall Cognitive Status: No family/caregiver present to determine baseline cognitive functioning                                 General Comments: Pt is alert to self only, year "1985" and is currently "at the [employer]"        Exercises Total Joint Exercises Ankle Circles/Pumps: AAROM, AROM, Both, 5 reps, 10 reps Short Arc Quad: AAROM, AROM, Strengthening, Both, 10 reps Heel Slides: AAROM, Strengthening, Both, 10 reps, 5 reps Hip ABduction/ADduction: AAROM, Strengthening, Both, 10 reps, 5 reps Straight Leg Raises: AAROM, Strengthening, Both, 10 reps, 5 reps    General Comments        Pertinent Vitals/Pain Pain Assessment Pain Assessment: No/denies pain    Home Living Family/patient expects to be discharged to:: Unsure                   Additional Comments: Pt is alert to self only, year "1983" and is currently "at the [employer]", unable to provide history    Prior Function            PT Goals (current goals can now be found in the care plan section) Acute Rehab PT Goals PT Goal Formulation: Patient unable to participate in goal setting Time For Goal Achievement: 06/06/22 Potential to Achieve Goals: Good Progress towards PT goals: Progressing toward goals    Frequency    BID      PT Plan Current plan remains appropriate    Co-evaluation              AM-PAC PT "6 Clicks" Mobility   Outcome Measure  Help needed turning from your back to your side while in a flat bed without using bedrails?: A Little Help needed moving from lying on your back to sitting on the side of a flat bed without using bedrails?: A Little Help needed moving to and from a bed to a chair (including a wheelchair)?: A Little Help needed standing up from a chair using your arms (e.g., wheelchair or bedside chair)?: A Little Help needed to walk in hospital room?: A Lot Help needed climbing  3-5 steps with a railing? : Total 6 Click Score: 15    End of Session Equipment Utilized During Treatment: Gait belt Activity Tolerance: Patient tolerated treatment well Patient left: in bed;with call bell/phone within reach;with bed alarm set;Other (comment);with SCD's reapplied (safety mittens donned, nursing notified) Nurse Communication: Mobility status;Weight bearing status PT Visit Diagnosis: Unsteadiness on feet (R26.81);History of falling (Z91.81);Other abnormalities of gait and mobility (R26.89);Muscle weakness (generalized) (M62.81)     Time: 6962-9528 PT Time Calculation (min) (ACUTE ONLY): 26 min  Charges:  $Therapeutic Exercise: 8-22 mins $Therapeutic Activity: 8-22 mins                     D. Scott Veniamin Kincaid PT, DPT 05/24/22, 1:57 PM

## 2022-05-24 NOTE — Plan of Care (Signed)
  Problem: Education: Goal: Knowledge of General Education information will improve Description: Including pain rating scale, medication(s)/side effects and non-pharmacologic comfort measures Outcome: Progressing   Problem: Health Behavior/Discharge Planning: Goal: Ability to manage health-related needs will improve Outcome: Progressing   Problem: Clinical Measurements: Goal: Ability to maintain clinical measurements within normal limits will improve Outcome: Progressing Goal: Will remain free from infection Outcome: Progressing Goal: Diagnostic test results will improve Outcome: Progressing Goal: Respiratory complications will improve Outcome: Progressing Goal: Cardiovascular complication will be avoided Outcome: Progressing   Problem: Activity: Goal: Risk for activity intolerance will decrease Outcome: Progressing   Problem: Nutrition: Goal: Adequate nutrition will be maintained Outcome: Progressing   Problem: Coping: Goal: Level of anxiety will decrease Outcome: Progressing   Problem: Elimination: Goal: Will not experience complications related to bowel motility Outcome: Progressing Goal: Will not experience complications related to urinary retention Outcome: Progressing   Problem: Pain Managment: Goal: General experience of comfort will improve Outcome: Progressing   Problem: Safety: Goal: Ability to remain free from injury will improve Outcome: Progressing   Problem: Skin Integrity: Goal: Risk for impaired skin integrity will decrease Outcome: Progressing   Problem: Clinical Measurements: Goal: Postoperative complications will be avoided or minimized Outcome: Progressing   Problem: Pain Management: Goal: Pain level will decrease Outcome: Progressing   Problem: Education: Goal: Knowledge of the prescribed therapeutic regimen will improve Outcome: Progressing Goal: Understanding of discharge needs will improve Outcome: Progressing   Problem:  Activity: Goal: Ability to avoid complications of mobility impairment will improve Outcome: Progressing Goal: Ability to tolerate increased activity will improve Outcome: Progressing   Problem: Pain Management: Goal: Pain level will decrease with appropriate interventions Outcome: Progressing   Problem: Skin Integrity: Goal: Will show signs of wound healing Outcome: Progressing   

## 2022-05-24 NOTE — Plan of Care (Signed)
  Problem: Education: Goal: Knowledge of General Education information will improve Description: Including pain rating scale, medication(s)/side effects and non-pharmacologic comfort measures Outcome: Progressing   Problem: Health Behavior/Discharge Planning: Goal: Ability to manage health-related needs will improve Outcome: Progressing   Problem: Clinical Measurements: Goal: Ability to maintain clinical measurements within normal limits will improve Outcome: Progressing Goal: Will remain free from infection Outcome: Progressing Goal: Diagnostic test results will improve Outcome: Progressing Goal: Respiratory complications will improve Outcome: Progressing Goal: Cardiovascular complication will be avoided Outcome: Progressing   Problem: Activity: Goal: Risk for activity intolerance will decrease Outcome: Progressing   Problem: Nutrition: Goal: Adequate nutrition will be maintained Outcome: Progressing   Problem: Coping: Goal: Level of anxiety will decrease Outcome: Progressing   Problem: Elimination: Goal: Will not experience complications related to bowel motility Outcome: Progressing Goal: Will not experience complications related to urinary retention Outcome: Progressing   Problem: Pain Managment: Goal: General experience of comfort will improve Outcome: Progressing   Problem: Safety: Goal: Ability to remain free from injury will improve Outcome: Progressing   Problem: Skin Integrity: Goal: Risk for impaired skin integrity will decrease Outcome: Progressing   Problem: Clinical Measurements: Goal: Postoperative complications will be avoided or minimized Outcome: Progressing   Problem: Pain Management: Goal: Pain level will decrease Outcome: Progressing   Problem: Education: Goal: Knowledge of the prescribed therapeutic regimen will improve Outcome: Progressing Goal: Understanding of discharge needs will improve Outcome: Progressing   Problem:  Activity: Goal: Ability to avoid complications of mobility impairment will improve Outcome: Progressing Goal: Ability to tolerate increased activity will improve Outcome: Progressing   Problem: Pain Management: Goal: Pain level will decrease with appropriate interventions Outcome: Progressing   Problem: Skin Integrity: Goal: Will show signs of wound healing Outcome: Progressing

## 2022-05-24 NOTE — Evaluation (Signed)
Physical Therapy Evaluation Patient Details Name: Lisa Martin MRN: [id number] DOB: 1931-10-08 Today's Date: 05/24/2022  History of Present Illness  Pt is an 89 y.o. female with medical history significant of dementia, HTN, dCHF, hypothyroidism, depression with anxiety, GERD, breast cancer (s/p of radiation and chemotherapy), who presents with fall with right hip pain and confusion. Pt diagnosed with right hip Intertrochanteric fracture and is s/p IM nail.  MD assessment also includes acute metabolic encephalopathy, HTN, R rib fracture, and severe protein-calorie malnutrition.   Clinical Impression  Pt pleasant but very confused, alert to self only.  Pt able to follow some simple 1-step commands but required significant time and cuing to do so.  Pt required physical assistance with all functional tasks including assist to prevent LOB during standing activities.  Pt unable to consistently utilize RW in a safe manner secondary to cognitive deficits exacerbating her fall risk.  Pt is at a very high risk for further falls and would benefit from 24/7 supervision at discharge.  Pt will also benefit from continued PT services upon discharge to safely address deficits listed in patient problem list for decreased caregiver assistance and eventual return to PLOF.        Recommendations for follow up therapy are one component of a multi-disciplinary discharge planning process, led by the attending physician.  Recommendations may be updated based on patient status, additional functional criteria and insurance authorization.  Follow Up Recommendations Can patient physically be transported by private vehicle: Yes     Assistance Recommended at Discharge Frequent or constant Supervision/Assistance  Patient can return home with the following  A lot of help with walking and/or transfers;A lot of help with bathing/dressing/bathroom;Assistance with cooking/housework;Assistance with feeding;Direct supervision/assist  for medications management;Direct supervision/assist for financial management;Assist for transportation;Help with stairs or ramp for entrance    Equipment Recommendations Other (comment) (TBD at next venue of care)  Recommendations for Other Services       Functional Status Assessment Patient has had a recent decline in their functional status and demonstrates the ability to make significant improvements in function in a reasonable and predictable amount of time.     Precautions / Restrictions Precautions Precautions: Fall Restrictions Weight Bearing Restrictions: Yes RLE Weight Bearing: Weight bearing as tolerated      Mobility  Bed Mobility Overal bed mobility: Needs Assistance Bed Mobility: Supine to Sit     Supine to sit: Min assist     General bed mobility comments: Max multi-modal cuing for sequencing with Min A for BLE and trunk control    Transfers Overall transfer level: Needs assistance Equipment used: Rolling walker (2 wheels) Transfers: Sit to/from Stand Sit to Stand: Min assist           General transfer comment: Max cuing to initiate movement and then min A to come to full upright standing    Ambulation/Gait Ambulation/Gait assistance: Min assist, Mod assist Gait Distance (Feet): 5 Feet Assistive device: Rolling walker (2 wheels) Gait Pattern/deviations: Step-through pattern, Decreased step length - right, Decreased step length - left, Trunk flexed Gait velocity: decreased     General Gait Details: Max multi-modal cueing and occasional physical assist to keep hands on RW and to prevent LOB; pt impulsive and frequently attempted to discard RW to the side at which time she would lose her balance needing assist to prevent fall  Stairs            Wheelchair Mobility    Modified Rankin (Stroke Patients Only)  Balance Overall balance assessment: Needs assistance, History of Falls   Sitting balance-Leahy Scale: Poor Sitting balance -  Comments: Frequent posterior lean in sitting but likely secondary to confusion with pt seeming to be attempting to lie down Postural control: Posterior lean Standing balance support: Bilateral upper extremity supported, During functional activity Standing balance-Leahy Scale: Poor Standing balance comment: Frequent assist to prevent LOB                             Pertinent Vitals/Pain Pain Assessment Pain Assessment: No/denies pain    Home Living Family/patient expects to be discharged to:: Unsure                   Additional Comments: Pt is alert to self only, year "1985" and is currently "at the [employer]", unable to provide history    Prior Function Prior Level of Function : Patient poor historian/Family not available             Mobility Comments: unknown       Hand Dominance        Extremity/Trunk Assessment   Upper Extremity Assessment Upper Extremity Assessment: Generalized weakness    Lower Extremity Assessment Lower Extremity Assessment: Generalized weakness       Communication   Communication: No difficulties  Cognition Arousal/Alertness: Awake/alert Behavior During Therapy: Impulsive, Restless Overall Cognitive Status: No family/caregiver present to determine baseline cognitive functioning                                 General Comments: Pt is alert to self only, year "1985" and is currently "at the [employer]"        General Comments      Exercises     Assessment/Plan    PT Assessment Patient needs continued PT services  PT Problem List Decreased strength;Decreased activity tolerance;Decreased balance;Decreased mobility;Decreased knowledge of use of DME;Decreased safety awareness       PT Treatment Interventions DME instruction;Gait training;Stair training;Functional mobility training;Therapeutic activities;Therapeutic exercise;Balance training;Patient/family education    PT  Goals (Current goals can be found in the Care Plan section)  Acute Rehab PT Goals PT Goal Formulation: Patient unable to participate in goal setting Time For Goal Achievement: 06/06/22 Potential to Achieve Goals: Good    Frequency BID     Co-evaluation               AM-PAC PT "6 Clicks" Mobility  Outcome Measure Help needed turning from your back to your side while in a flat bed without using bedrails?: A Little Help needed moving from lying on your back to sitting on the side of a flat bed without using bedrails?: A Little Help needed moving to and from a bed to a chair (including a wheelchair)?: A Little Help needed standing up from a chair using your arms (e.g., wheelchair or bedside chair)?: A Little Help needed to walk in hospital room?: A Lot Help needed climbing 3-5 steps with a railing? : Total 6 Click Score: 15    End of Session Equipment Utilized During Treatment: Gait belt Activity Tolerance: Patient tolerated treatment well Patient left: in chair;with call bell/phone within reach;with chair alarm set;with nursing/sitter in room Nurse Communication: Mobility status;Weight bearing status PT Visit Diagnosis: Unsteadiness on feet (R26.81);History of falling (Z91.81);Other abnormalities of gait and mobility (R26.89);Muscle weakness (generalized) (M62.81)    Time:  9604-5409 PT Time Calculation (min) (ACUTE ONLY): 21 min   Charges:   PT Evaluation $PT Eval Moderate Complexity: 1 Mod         D. Scott Ilijah Doucet PT, DPT 05/24/22, 10:39 AM

## 2022-05-24 NOTE — Progress Notes (Signed)
PHARMACIST - PHYSICIAN COMMUNICATION  CONCERNING:  Enoxaparin (Lovenox) for DVT Prophylaxis    RECOMMENDATION: Patient was prescribed enoxaprin  q24 hours for VTE prophylaxis.   Filed Weights   05/22/22 1648 05/23/22 1104  Weight: 43.9 kg (96 lb 11.2 oz) 43.9 kg (96 lb 12.5 oz)    Body mass index is 20.23 kg/m.  Estimated Creatinine Clearance: 28.2 mL/min (by C-G formula based on SCr of 0.89 mg/dL).   Patient is candidate for enoxaparin  every 24 hours based on CrCl <52ml/min or Weight <45kg  DESCRIPTION: Pharmacy has adjusted enoxaparin dose per [facility] policy.  Patient is now receiving enoxaparin 30 mg every 24 hours    Barrie Folk, PharmD Clinical Pharmacist  05/24/2022 9:18 AM

## 2022-05-24 NOTE — Progress Notes (Signed)
[facility] Progress Note  Patient: Lisa Martin    ZOX:[id number]  DOA: 05/22/2022     Date of Service: the patient was seen and examined on 05/24/2022  Chief Complaint  Patient presents with   Fall   Brief hospital course: Lisa Martin is a 89 y.o. female with medical history significant of dementia, HTN, dCHF, hypothyroidism, depression with anxiety, GERD, breast cancer (s/p of radiation and chemotherapy), who presents with fall, right hip pain and confusion. Per her daughter at bedside, pt fell accidentally when she was walking around her wheelchair at about 9:00 PM yesterday. No LOC. X-ray of right hip/pelvis: Acute comminuted intertrochanteric fracture of the proximal right femur with varus angulation. X-ray of right femur is negatve for femoral shaft fracture.  Patient is admitted to telemetry bed as inpatient.    Assessment and Plan:  Closed right hip fracture: X-ray of right hip/pelvis showed acute comminuted intertrochanteric fracture of the proximal right femur with varus angulation. Orthopedic surgeon, Dr. Audelia Acton is consulted,  Patient's GUPTA score perioperative myocardial infarction or cardaic arrest is 2.63 %.  S/p  Right Hip Intramedullary nail done on 4/22 Follow up with [facility] in 2 weeks, WBAT RLE, Lovenox 40 mg SQ daily x 14 days at discharge PT and OT eval for SNF placement TOC consulted for placement  Fall at home, initial encounter: -PT/OT eval for placement, Fall precaution -CK level 113 wnl    Chronic diastolic CHF (congestive heart failure): 2D echo on 12/11/2017 showed EF> 55% with grade 1 diastolic dysfunction.  Patient has mildly elevated BNP 204, but no leg edema or JVD.  No shortness of breath.  CHF is compensated. -monitor volume status    Acute metabolic encephalopathy -Frequent neurochecks -urinalysis negative    Hypertension -IV hydralazine as needed -Dilt-XR   Acquired hypothyroidism -Synthroid   Leukocytosis: WBC 12.7, no fever.  So  far no source of infection identified, likely reactive. -urinalysis negative    Right rib fracture: Chest x-ray showed subacute right lateral 9th and 10th rib fractures -Incentive spirometry -Pain control   Dementia -Fall precaution -Namenda, Exelon   Depression and anxiety -Continue home medications   Protein-calorie malnutrition, severe: Body weight 40 kg, BMI 17.22 -Ensure -Nutrition consult  Vitamin D deficiency: started vitamin D 50,000 units p.o. weekly, follow with PCP to repeat vitamin D level after 3 to 6 months.   Body mass index is 20.23 kg/m.  Nutrition Problem: Severe Malnutrition Etiology: chronic illness (dementia, CHF) Interventions: Interventions: Ensure Enlive (each supplement provides 350kcal and 20 grams of protein), MVI      Diet: Regular diet DVT Prophylaxis: Subcutaneous Lovenox   Advance goals of care discussion: Full code  Family Communication: family was present at bedside, at the time of interview.  The pt provided permission to discuss medical plan with the family. Opportunity was given to ask question and all questions were answered satisfactorily.   Disposition:  Pt is from Home, admitted with Fall and right Hip fracture, patient will need SNF placement, which precludes a safe discharge. Discharge to SNF, TBD after PT/OT eval, can be discharged when bed will be available.   Subjective: No significant events overnight, in the morning time patient was resting comfortably on the recliner, patient seems to be very confused, AO x 1 at baseline.  Did not offer any complaints.   Physical Exam: General: NAD, lying comfortably Appear in no distress, affect appropriate Eyes: PERRLA ENT: Oral Mucosa Clear, moist  Neck: no JVD,  Cardiovascular:  S1 and S2 Present, no Murmur,  Respiratory: good respiratory effort, Bilateral Air entry equal and Decreased, no Crackles, no wheezes Abdomen: Bowel Sound present, Soft and no tenderness,  Skin: no  rashes Extremities: no Pedal edema, no calf tenderness, right hip dressing CDI Neurologic: without any new focal findings Gait not checked due to patient safety concerns  Vitals:   05/24/22 0445 05/24/22 0458 05/24/22 0619 05/24/22 0807  BP: (!) 169/89 (!) 165/82 (!) 173/81 130/77  Pulse: 100 70 80 60  Resp: Temp: 97.7 F (36.5 C) 98.2 F (36.8 C)  98.8 F (37.1 C)  TempSrc:    Axillary  SpO2: 96% 95%  96%  Weight:      Height:        Intake/Output Summary (Last 24 hours) at 05/24/2022 1525 Last data filed at 05/24/2022 0600 Gross per 24 hour  Intake 102.15 ml  Output 700 ml  Net -597.85 ml   Filed Weights   05/22/22 1648 05/23/22 1104  Weight: 43.9 kg 43.9 kg    Data Reviewed: I have personally reviewed and interpreted daily labs, tele strips, imagings as discussed above. I reviewed all nursing notes, pharmacy notes, vitals, pertinent old records I have discussed plan of care as described above with RN and patient/family.  CBC: Recent Labs  Lab 05/22/22 1306 05/23/22 0507 05/24/22 0619  WBC 12.7* 11.4* 21.4*  NEUTROABS 10.4*  --   --   HGB 12.2 10.5* 9.9*  HCT 35.6* 29.8* 27.2*  MCV 85.0 84.7 82.4  PLT 272 203 229   Basic Metabolic Panel: Recent Labs  Lab 05/22/22 1306 05/23/22 0507 05/23/22 0912 05/24/22 0619  NA 134* 136  --  131*  K 3.7 3.2*  --  3.1*  CL 100 104  --  98  CO2 23 22  --  24  GLUCOSE 126* 106*  --  183*  BUN 13 16  --  21  CREATININE 0.71 0.80  --  0.89  CALCIUM 8.8* 8.1*  --  8.3*  MG  --   --  1.9 2.0  PHOS  --   --  3.9 2.8    Studies: No results found.  Scheduled Meds:  acetaminophen  500 mg Oral Q6H   cyanocobalamin  1,000 mcg Oral Daily   diltiazem  120 mg Oral Daily   docusate sodium  100 mg Oral BID   [START ON 05/25/2022] enoxaparin (LOVENOX) injection  30 mg Subcutaneous Q24H   feeding supplement  237 mL Oral BID BM   levothyroxine  88 mcg Oral Daily   lidocaine  1 patch Transdermal Q24H   memantine   5 mg Oral BID   multivitamin with minerals  1 tablet Oral Daily   pantoprazole  40 mg Oral Daily   potassium chloride  40 mEq Oral Once   QUEtiapine  25 mg Oral QHS   rivastigmine  3 mg Oral BID   sertraline  25 mg Oral Daily   Vitamin D (Ergocalciferol)  50,000 Units Oral Q7 days   Continuous Infusions:  potassium chloride 10 mEq (05/24/22 1255)   potassium chloride 10 mEq (05/24/22 1426)   PRN Meds: acetaminophen, albuterol, ALPRAZolam, hydrALAZINE, HYDROcodone-acetaminophen, menthol-cetylpyridinium **OR** phenol, methocarbamol, metoCLOPramide **OR** metoCLOPramide (REGLAN) injection, morphine injection, ondansetron (ZOFRAN) IV, ondansetron **OR** ondansetron (ZOFRAN) IV, senna-docusate, traMADol  Time spent: 35 minutes  Author: Gillis Santa. MD [facility] 05/24/2022 3:25 PM  To reach On-call, see care teams to locate the attending and reach out  to them via [email or web address]. If 7PM-7AM, please contact night-coverage If you still have difficulty reaching the attending provider, please page the [facility] (Director on Call) for [facility] on amion for assistance.

## 2022-05-24 NOTE — Progress Notes (Addendum)
Subjective: 1 Day Post-Op Procedure(s) (LRB): INTRAMEDULLARY (IM) NAIL INTERTROCHANTERIC (Right) Patient denies having any pain. She is a difficult historian.   Patient is well, and has had no acute complaints or problems Denies any CP, SOB, ABD pain. We will continue therapy today.  Plan is to go Skilled nursing facility after hospital stay.  Objective: Vital signs in last 24 hours: Temp:  [97.4 F (36.3 C)-98.3 F (36.8 C)] 98.2 F (36.8 C) (04/23 0458) Pulse Rate:  [66-103] 80 (04/23 0619) Resp:  [16-20] 16 (04/23 0458) BP: (126-173)/(61-92) 173/81 (04/23 0619) SpO2:  [93 %-100 %] 95 % (04/23 0458) Weight:  [43.9 kg] 43.9 kg (04/22 1104)  Intake/Output from previous day: 04/22 0701 - 04/23 0700 In: 1252.2 [I.V.:1000; IV Piggyback:252.2] Out: 800 [Urine:800] Intake/Output this shift: No intake/output data recorded.  Recent Labs    05/22/22 1306 05/23/22 0507 05/24/22 0619  HGB 12.2 10.5* 9.9*   Recent Labs    05/23/22 0507 05/24/22 0619  WBC 11.4* 21.4*  RBC 3.52* 3.30*  HCT 29.8* 27.2*  PLT 203 229   Recent Labs    05/23/22 0507 05/24/22 0619  NA 136 131*  K 3.2* 3.1*  CL 104 98  CO2 22 24  BUN 16 21  CREATININE 0.80 0.89  GLUCOSE 106* 183*  CALCIUM 8.1* 8.3*   Recent Labs    05/22/22 1617  INR 1.2    EXAM General - Patient is Alert and Confused Extremity - Sensation intact distally Intact pulses distally Dorsiflexion/Plantar flexion intact No cellulitis present Compartment soft Dressing - dressing C/D/I and no drainage Motor Function - intact, moving foot and toes well on exam.   Past Medical History:  Diagnosis Date   Breast cancer 2000   right   Breast screening, unspecified    Cancer 2000   excision upper inner quadrant right breast cancer   Cancer 2000   wide excision,sn biopsy and axillary dissection   Dementia    Hypertension 2010   Malignant neoplasm of upper-inner quadrant of female breast 2000   Obesity, unspecified     Personal history of chemotherapy    Personal history of malignant neoplasm of breast 2000   Personal history of radiation therapy    Personal history of tobacco use, presenting hazards to health    Special screening for malignant neoplasms, colon    Thyroid disease 2012   hyperactive thyroid    Assessment/Plan:   1 Day Post-Op Procedure(s) (LRB): INTRAMEDULLARY (IM) NAIL INTERTROCHANTERIC (Right) Principal Problem:   Closed right hip fracture Active Problems:   Acute metabolic encephalopathy   Hypertension   Acquired hypothyroidism   Protein-calorie malnutrition, severe   Fall at home, initial encounter   Leukocytosis   Right rib fracture   Chronic diastolic CHF (congestive heart failure)   Dementia   Depression with anxiety   Intertrochanteric fracture of right hip, closed, initial encounter  Estimated body mass index is 20.23 kg/m as calculated from the following:   Height as of this encounter:  (1.473 m).   Weight as of this encounter: 43.9 kg. Advance diet Up with therapy Pain controlled Vital signs stable Hgb stable, 9.9 CM to assist with discharge to SNF  Follow up with [facility] in 2 weeks WBAT RLE Lovenox 40 mg SQ daily x 14 days at discharge  DVT Prophylaxis - Lovenox, TED hose, and SCDs Weight-Bearing as tolerated to right leg   T. Cranston Neighbor, PA-C [facility] 05/24/2022, 7:40 AM  Patient seen and  examined, agree with above plan.  The patient is doing well status post right hip ORIF with IMN, no concerns at this time.  Pain is controlled, labs stable. Patient with some nausea/vomiting this morning being treated with zofran by nursing team.   Reinaldo Berber MD

## 2022-05-25 DIAGNOSIS — S72001A Fracture of unspecified part of neck of right femur, initial encounter for closed fracture: Secondary | ICD-10-CM | POA: Diagnosis not present

## 2022-05-25 LAB — MAGNESIUM: Magnesium: 2.1 mg/dL (ref 1.7–2.4)

## 2022-05-25 LAB — BASIC METABOLIC PANEL
Anion gap: 6 (ref 5–15)
BUN: 28 mg/dL — ABNORMAL HIGH (ref 8–23)
CO2: 25 mmol/L (ref 22–32)
Calcium: 8.4 mg/dL — ABNORMAL LOW (ref 8.9–10.3)
Chloride: 103 mmol/L (ref 98–111)
Creatinine, Ser: 1.01 mg/dL — ABNORMAL HIGH (ref 0.44–1.00)
GFR, Estimated: 54 mL/min — ABNORMAL LOW (ref >60)
Glucose, Bld: 108 mg/dL — ABNORMAL HIGH (ref 70–99)
Potassium: 4.4 mmol/L (ref 3.5–5.1)
Sodium: 134 mmol/L — ABNORMAL LOW (ref 135–145)

## 2022-05-25 LAB — CBC
HCT: 23.8 % — ABNORMAL LOW (ref 36.0–46.0)
Hemoglobin: 8.4 g/dL — ABNORMAL LOW (ref 12.0–15.0)
MCH: 29.9 pg (ref 26.0–34.0)
MCHC: 35.3 g/dL (ref 30.0–36.0)
MCV: 84.7 fL (ref 80.0–100.0)
Platelets: 230 10*3/uL (ref 150–400)
RBC: 2.81 MIL/uL — ABNORMAL LOW (ref 3.87–5.11)
RDW: 13.9 % (ref 11.5–15.5)
WBC: 13.7 10*3/uL — ABNORMAL HIGH (ref 4.0–10.5)
nRBC: 0 % (ref 0.0–0.2)

## 2022-05-25 LAB — PHOSPHORUS: Phosphorus: 2.7 mg/dL (ref 2.5–4.6)

## 2022-05-25 MED ORDER — ENOXAPARIN SODIUM 30 MG/0.3ML IJ SOSY: 30.0000 mg | PREFILLED_SYRINGE | Freq: Every evening | INTRAMUSCULAR | Status: DC

## 2022-05-25 NOTE — Evaluation (Addendum)
Occupational Therapy Evaluation Patient Details Name: Lisa Martin MRN: [id number] DOB: 05/31/1931 Today's Date: 05/25/2022   History of Present Illness Pt is an 89 y.o. female with medical history significant of dementia, HTN, dCHF, hypothyroidism, depression with anxiety, GERD, breast cancer (s/p of radiation and chemotherapy), who presents with fall with right hip pain and confusion. Pt diagnosed with right hip Intertrochanteric fracture and is s/p IM nail.  MD assessment also includes acute metabolic encephalopathy, HTN, R rib fracture, and severe protein-calorie malnutrition.   Clinical Impression   Patient presenting with decreased independence in self care, balance, functional mobility/transfers, endurance, and safety awareness. Pt with cognitive deficits at baseline, therefore, unable to provide accurate PLOF and no family/caregiver present during evaluation. Pt currently functioning at Max A for self-feeding and total A to reposition in chair. R lateral lean noted. Pt inconsistently followed single step commands and demonstrated visual deficits (reported she normally wears glasses but not present in room). Pt will benefit from skilled acute OT services to address deficits noted below. OT recommends ongoing therapy upon discharge to maximize safety and independence with ADLs, decrease fall risk, decrease caregiver burden, and promote return to PLOF.      Recommendations for follow up therapy are one component of a multi-disciplinary discharge planning process, led by the attending physician.  Recommendations may be updated based on patient status, additional functional criteria and insurance authorization.   Assistance Recommended at Discharge Frequent or constant Supervision/Assistance  Patient can return home with the following Two people to help with walking and/or transfers;Two people to help with bathing/dressing/bathroom;Assistance with cooking/housework;Assist for transportation;Help with  stairs or ramp for entrance;Assistance with feeding;Direct supervision/assist for financial management;Direct supervision/assist for medications management    Functional Status Assessment  Patient has had a recent decline in their functional status and demonstrates the ability to make significant improvements in function in a reasonable and predictable amount of time.  Equipment Recommendations  Other (comment) (defer to next venue of care)    Recommendations for Other Services       Precautions / Restrictions Precautions Precautions: Fall Restrictions Weight Bearing Restrictions: Yes RLE Weight Bearing: Weight bearing as tolerated      Mobility Bed Mobility     General bed mobility comments: NT, pt received/left in recliner    Transfers       General transfer comment: Pt with limited ability to participate in mobility, required Total A to reposition in chair.      Balance Overall balance assessment: Needs assistance, History of Falls   Sitting balance-Leahy Scale: Poor Sitting balance - Comments: R lateral lean noted while pt sitting up in recliner. Unable to self-correct, required Total A and propped up with pillows. Postural control: Right lateral lean         ADL either performed or assessed with clinical judgement   ADL Overall ADL's : Needs assistance/impaired Eating/Feeding: Maximal assistance;Cueing for sequencing;Sitting Eating/Feeding Details (indicate cue type and reason): Assistance required to cut up food. Unable to find bacon on white plate (due to farther distance?), however, dropped piece of toast on white towel & was able to retrieve with fork with increased time. Max VC required.       General ADL Comments: Evaluation limited 2/2 cognition and fatigue/lethargy.     Vision Patient Visual Report: No change from baseline Additional Comments: Pt reports wearing glasses all the time at baseline, however, not present in hospital room. Unable to  visually track, would follow stimulus briefly then stare off into  space & unable to correct gaze to find stimulus again Vision Assessment? Vision impaired- to be further tested in functional context      Perception     Praxis      Pertinent Vitals/Pain Pain Assessment Pain Assessment: PAINAD Breathing: occasional labored breathing, short period of hyperventilation Negative Vocalization: occasional moan/groan, low speech, negative/disapproving quality Facial Expression: smiling or inexpressive Body Language: relaxed Consolability: distracted or reassured by voice/touch PAINAD Score: 3 Pain Location: R hip Pain Descriptors / Indicators: Guarding, Moaning Pain Intervention(s): Limited activity within patient's tolerance, Monitored during session, Repositioned, Ice applied     Hand Dominance     Extremity/Trunk Assessment Upper Extremity Assessment Upper Extremity Assessment: Generalized weakness;Difficult to assess due to impaired cognition   Lower Extremity Assessment Lower Extremity Assessment: Generalized weakness;Difficult to assess due to impaired cognition       Communication Communication Communication: No difficulties   Cognition Arousal/Alertness: Lethargic Behavior During Therapy: Flat affect Overall Cognitive Status: History of cognitive impairments - at baseline       General Comments: Pt with h/o dementia at baseline. Pt is oriented to name only. Stated current location is [facility] and current year is 65. Pt dosing off at end of session, required Max VC for redirection t/o. Inconsistently follow single step commands.     General Comments       Exercises Other Exercises Other Exercises: OT provided education re: role of OT, OT POC, post acute recs, sitting up for all meals, EOB/OOB mobility with assistance, home/fall safety, RLE WBAT   Shoulder Instructions      Home Living Family/patient expects to be discharged to:: Unsure         Prior Functioning/Environment Prior Level of Function : Patient poor historian/Family not available               ADLs Comments: unknown        OT Problem List: Decreased strength;Decreased activity tolerance;Impaired balance (sitting and/or standing);Impaired vision/perception;Decreased cognition;Decreased safety awareness;Decreased knowledge of use of DME or AE;Decreased knowledge of precautions;Pain      OT Treatment/Interventions:  Self-care/ADL training; Therapeutic exercise; Energy conservation; DME and/or AE instruction; Therapeutic activities; Cognitive remediation/compensation; Visual/perceptual remediation/compensation; Patient/family education; Balance training    OT Goals(Current goals can be found in the care plan section) Acute Rehab OT Goals Patient Stated Goal: none stated OT Goal Formulation: Patient unable to participate in goal setting Time For Goal Achievement: 06/08/22 Potential to Achieve Goals: Fair   OT Frequency: Min 1X/week    Co-evaluation              AM-PAC OT "6 Clicks" Daily Activity     Outcome Measure Help from another person eating meals?: A Lot Help from another person taking care of personal grooming?: A Lot Help from another person toileting, which includes using toliet, bedpan, or urinal?: Total Help from another person bathing (including washing, rinsing, drying)?: Total Help from another person to put on and taking off regular upper body clothing?: A Lot Help from another person to put on and taking off regular lower body clothing?: Total 6 Click Score: 9   End of Session Nurse Communication: Mobility status;Other (comment) (RN & NT notified pt will need assistance with self-feeding)  Activity Tolerance: Patient limited by fatigue;Other (comment) (cognition) Patient left: in chair;with call bell/phone within reach;with chair alarm set  OT Visit Diagnosis: Other abnormalities of gait and mobility (R26.89);Muscle weakness  (generalized) (M62.81);History of falling (Z91.81);Pain;Other symptoms and signs involving cognitive function Pain -  Right/Left: Right Pain - part of body: Hip                Time: 5409-8119 OT Time Calculation (min): 29 min Charges:  OT General Charges $OT Visit: 1 Visit OT Evaluation $OT Eval Moderate Complexity: 1 Mod  [facility] MS, OTR/L ascom [phone]  05/25/22, 12:13 PM

## 2022-05-25 NOTE — NC FL2 (Signed)
[place] MEDICAID FL2 LEVEL OF CARE FORM     IDENTIFICATION  Patient Name: Lisa Martin Birthdate: 12-Jun-1931 Sex: female Admission Date (Current Location): 05/22/2022  [facility] and [place] Number:  Chiropodist and Address:  [facility], [address] [place], [place] [address]      Provider Number: [id number]  Attending Physician Name and Address:  Gillis Santa, MD  Relative Name and Phone Number:  Blenda Bridegroom [phone]    Current Level of Care: Hospital Recommended Level of Care: Skilled Nursing Facility Prior Approval Number:    Date Approved/Denied:   PASRR Number: [id number]  Discharge Plan: SNF    Current Diagnoses: Patient Active Problem List   Diagnosis Date Noted   Intertrochanteric fracture of right hip, closed, initial encounter 05/23/2022   Closed right hip fracture 05/22/2022   Fall at home, initial encounter 05/22/2022   Leukocytosis 05/22/2022   Right rib fracture 05/22/2022   Chronic diastolic CHF (congestive heart failure) 05/22/2022   Dementia 05/22/2022   Depression with anxiety 05/22/2022   Rash 04/05/2021   Compression fracture of body of thoracic vertebra 04/05/2021   Protein-calorie malnutrition, severe 04/05/2021   Acute metabolic encephalopathy 03/27/2021   Severe sepsis 03/27/2021   Community acquired pneumonia 03/27/2021   Incidental finding of COVID-19 virus infection 03/27/2021   Dehydration 03/27/2021   Acute prerenal azotemia 03/27/2021   Personal history of malignant neoplasm of breast 05/08/2013   Hypertension 2010   GERD (gastroesophageal reflux disease) 2010   Acquired hypothyroidism 2010    Orientation RESPIRATION BLADDER Height & Weight     Self  Normal Incontinent Weight: 43.9 kg Height:  4\' 10"  (147.3 cm)  BEHAVIORAL SYMPTOMS/MOOD NEUROLOGICAL BOWEL NUTRITION STATUS      Incontinent  (See discharge summary)  AMBULATORY STATUS COMMUNICATION OF NEEDS Skin   Extensive Assist  Verbally Normal                       Personal Care Assistance Level of Assistance  Bathing, Feeding, Dressing Bathing Assistance: Maximum assistance Feeding assistance: Maximum assistance Dressing Assistance: Maximum assistance     Functional Limitations Info  Sight, Hearing, Speech Sight Info: Impaired (Patient wears glasses) Hearing Info: Adequate Speech Info: Adequate    SPECIAL CARE FACTORS FREQUENCY  PT (By licensed PT), OT (By licensed OT)     PT Frequency: 5x weekly OT Frequency: 5x weekly            Contractures Contractures Info: Not present    Additional Factors Info  Code Status, Allergies Code Status Info: Full Code Allergies Info: Atorvastatin           Current Medications (05/25/2022):  This is the current hospital active medication list Current Facility-Administered Medications  Medication Dose Route Frequency Provider Last Rate Last Admin   acetaminophen (TYLENOL) tablet 325-650 mg  325-650 mg Oral Q6H PRN Reinaldo Berber, MD   650 mg at 05/25/22 0510   albuterol (PROVENTIL) (2.5 MG/3ML) 0.083% nebulizer solution 2.5 mg  2.5 mg Inhalation Q4H PRN Lorretta Harp, MD       ALPRAZolam Prudy Feeler) tablet 0.25 mg  0.25 mg Oral BID PRN Lorretta Harp, MD   0.25 mg at 05/23/22 2228   cyanocobalamin (VITAMIN B12) tablet 1,000 mcg  1,000 mcg Oral Daily Lorretta Harp, MD   1,000 mcg at 05/25/22 8413   diltiazem (CARDIZEM LA) 24 hr tablet 120 mg  120 mg Oral Daily Lorretta Harp, MD   120 mg at  05/25/22 0828   docusate sodium (COLACE) capsule 100 mg  100 mg Oral BID Reinaldo Berber, MD   100 mg at 05/25/22 2956   enoxaparin (LOVENOX) injection 30 mg  30 mg Subcutaneous Q24H Barrie Folk, RPH   30 mg at 05/25/22 2130   feeding supplement (ENSURE ENLIVE / ENSURE PLUS) liquid 237 mL  237 mL Oral BID BM Lorretta Harp, MD   237 mL at 05/25/22 8657   hydrALAZINE (APRESOLINE) injection 5 mg  5 mg Intravenous Q2H PRN Lorretta Harp, MD   5 mg at 05/24/22 8469    HYDROcodone-acetaminophen (NORCO/VICODIN) 5-325 MG per tablet 1-2 tablet  1-2 tablet Oral Q4H PRN Reinaldo Berber, MD       levothyroxine (SYNTHROID) tablet 88 mcg  88 mcg Oral Daily Lorretta Harp, MD   88 mcg at 05/25/22 0510   lidocaine (LIDODERM) 5 % 1 patch  1 patch Transdermal Q24H Lorretta Harp, MD   1 patch at 05/24/22 1255   memantine (NAMENDA) tablet 5 mg  5 mg Oral BID Lorretta Harp, MD   5 mg at 05/25/22 6295   menthol-cetylpyridinium (CEPACOL) lozenge 3 mg  1 lozenge Oral PRN Reinaldo Berber, MD       Or   phenol (CHLORASEPTIC) mouth spray 1 spray  1 spray Mouth/Throat PRN Reinaldo Berber, MD       methocarbamol (ROBAXIN) tablet 500 mg  500 mg Oral Q8H PRN Lorretta Harp, MD   500 mg at 05/25/22 2841   metoCLOPramide (REGLAN) tablet 5-10 mg  5-10 mg Oral Q8H PRN Reinaldo Berber, MD       Or   metoCLOPramide (REGLAN) injection 5-10 mg  5-10 mg Intravenous Q8H PRN Reinaldo Berber, MD       morphine (PF) 2 MG/ML injection 0.5-1 mg  0.5-1 mg Intravenous Q2H PRN Reinaldo Berber, MD       multivitamin with minerals tablet 1 tablet  1 tablet Oral Daily Gillis Santa, MD   1 tablet at 05/25/22 0822   ondansetron (ZOFRAN) injection 4 mg  4 mg Intravenous Q8H PRN Lorretta Harp, MD   4 mg at 05/24/22 0800   ondansetron (ZOFRAN) tablet 4 mg  4 mg Oral Q6H PRN Reinaldo Berber, MD       Or   ondansetron (ZOFRAN) injection 4 mg  4 mg Intravenous Q6H PRN Reinaldo Berber, MD       pantoprazole (PROTONIX) EC tablet 40 mg  40 mg Oral Daily Lorretta Harp, MD   40 mg at 05/25/22 3244   potassium chloride SA (KLOR-CON M) CR tablet 40 mEq  40 mEq Oral Once Gillis Santa, MD       QUEtiapine (SEROQUEL) tablet 25 mg  25 mg Oral QHS Lorretta Harp, MD   25 mg at 05/24/22 2100   rivastigmine (EXELON) capsule 3 mg  3 mg Oral BID Lorretta Harp, MD   3 mg at 05/25/22 0830   senna-docusate (Senokot-S) tablet 1 tablet  1 tablet Oral QHS PRN Lorretta Harp, MD   1 tablet at 05/23/22 2100   sertraline (ZOLOFT) tablet 25 mg  25 mg Oral  Daily Lorretta Harp, MD   25 mg at 05/25/22 0102   traMADol (ULTRAM) tablet 50 mg  50 mg Oral Q6H PRN Reinaldo Berber, MD   50 mg at 05/25/22 7253   Vitamin D (Ergocalciferol) (DRISDOL) 1.25 MG (50000 UNIT) capsule 50,000 Units  50,000 Units Oral Q7 days Gillis Santa, MD   50,000 Units at 05/24/22 [phone]  Discharge Medications: Please see discharge summary for a list of discharge medications.  Relevant Imaging Results:  Relevant Lab Results:   Additional Information [id number]  Garret Reddish, RN

## 2022-05-25 NOTE — Progress Notes (Signed)
Physical Therapy Treatment Patient Details Name: Lisa Martin MRN: [id number] DOB: [birth year] Today's Date: 05/25/2022   History of Present Illness Pt is an 89 y.o. female with medical history significant of dementia, HTN, dCHF, hypothyroidism, depression with anxiety, GERD, breast cancer (s/p of radiation and chemotherapy), who presents with fall with right hip pain and confusion. Pt diagnosed with right hip Intertrochanteric fracture and is s/p IM nail.  MD assessment also includes acute metabolic encephalopathy, HTN, R rib fracture, and severe protein-calorie malnutrition.    PT Comments    Pt pleasantly confused during the session but able to follow most 1-step commands with extra time and cuing.  Pt required physical assist with all functional tasks but put forth grossly increased effort with bed mobility tasks compared to prior session.  Pt was only able to take several very small, antalgic steps at the EOB before needing to return to sitting with SpO2 and HR WNL on room air.  Pt will benefit from continued PT services upon discharge to safely address deficits listed in patient problem list for decreased caregiver assistance and eventual return to PLOF.    Recommendations for follow up therapy are one component of a multi-disciplinary discharge planning process, led by the attending physician.  Recommendations may be updated based on patient status, additional functional criteria and insurance authorization.  Follow Up Recommendations  Can patient physically be transported by private vehicle: No    Assistance Recommended at Discharge Frequent or constant Supervision/Assistance  Patient can return home with the following A lot of help with walking and/or transfers;A lot of help with bathing/dressing/bathroom;Assistance with cooking/housework;Assistance with feeding;Direct supervision/assist for medications management;Direct supervision/assist for financial management;Assist for  transportation;Help with stairs or ramp for entrance   Equipment Recommendations  Other (comment)    Recommendations for Other Services       Precautions / Restrictions Precautions Precautions: Fall Restrictions Weight Bearing Restrictions: Yes RLE Weight Bearing: Weight bearing as tolerated     Mobility  Bed Mobility Overal bed mobility: Needs Assistance Bed Mobility: Supine to Sit, Sit to Supine     Supine to sit: Min assist Sit to supine: Mod assist   General bed mobility comments: Min to mod A for BLE and trunk control along with cues for sequencing and assist to place hand on bed rail    Transfers Overall transfer level: Needs assistance Equipment used: 2 person hand held assist Transfers: Sit to/from Stand Sit to Stand: Mod assist, From elevated surface           General transfer comment: Mod A to come to standing from an elevated EOB and to prevent posterior LOB while in standing    Ambulation/Gait Ambulation/Gait assistance: Mod assist, +2 physical assistance Gait Distance (Feet): 3 Feet Assistive device: 2 person hand held assist Gait Pattern/deviations: Decreased step length - left, Decreased stance time - right, Antalgic, Step-to pattern Gait velocity: decreased     General Gait Details: Heavy lean on BUEs with +2 HHA and Mod A for stability; pt antalgic on the RLE with decreased stance time and minor buckling during SLS on the RLE   Stairs             Wheelchair Mobility    Modified Rankin (Stroke Patients Only)       Balance Overall balance assessment: Needs assistance, History of Falls   Sitting balance-Leahy Scale: Poor   Postural control: Posterior lean Standing balance support: Bilateral upper extremity supported, During functional activity, Reliant on assistive device for  balance Standing balance-Leahy Scale: Poor                              Cognition Arousal/Alertness: Awake/alert Behavior During Therapy:  Flat affect Overall Cognitive Status: No family/caregiver present to determine baseline cognitive functioning                                          Exercises Total Joint Exercises Hip ABduction/ADduction: AAROM, Strengthening, Both, 10 reps, 5 reps Straight Leg Raises: AAROM, Strengthening, Both, 10 reps, 5 reps Long Arc Quad: AAROM, Strengthening, Both, 10 reps Other Exercises Other Exercises: Static sitting at the EOB x 8 min with anterior weight shifting activities; occasional min A to prevent posterior LOB    General Comments        Pertinent Vitals/Pain Pain Assessment Pain Assessment: PAINAD Breathing: occasional labored breathing, short period of hyperventilation Negative Vocalization: occasional moan/groan, low speech, negative/disapproving quality Facial Expression: smiling or inexpressive Body Language: relaxed Consolability: distracted or reassured by voice/touch PAINAD Score: 3 Pain Location: R hip Pain Descriptors / Indicators: Guarding, Moaning Pain Intervention(s): Repositioned, Premedicated before session, Monitored during session, Ice applied    Home Living Family/patient expects to be discharged to:: Unsure                        Prior Function            PT Goals (current goals can now be found in the care plan section) Progress towards PT goals: Progressing toward goals    Frequency    BID      PT Plan Current plan remains appropriate    Co-evaluation              AM-PAC PT "6 Clicks" Mobility   Outcome Measure  Help needed turning from your back to your side while in a flat bed without using bedrails?: A Little Help needed moving from lying on your back to sitting on the side of a flat bed without using bedrails?: A Little Help needed moving to and from a bed to a chair (including a wheelchair)?: A Little Help needed standing up from a chair using your arms (e.g., wheelchair or bedside chair)?: A  Little Help needed to walk in hospital room?: A Lot Help needed climbing 3-5 steps with a railing? : Total 6 Click Score: 15    End of Session Equipment Utilized During Treatment: Gait belt Activity Tolerance: Patient tolerated treatment well Patient left: with call bell/phone within reach;Other (comment);with SCD's reapplied;in bed;with bed alarm set (Ice bag to R hip with safety mittens donned) Nurse Communication: Mobility status;Weight bearing status PT Visit Diagnosis: Unsteadiness on feet (R26.81);History of falling (Z91.81);Other abnormalities of gait and mobility (R26.89);Muscle weakness (generalized) (M62.81)     Time: 1610-9604 PT Time Calculation (min) (ACUTE ONLY): 24 min  Charges:  $Therapeutic Exercise: 8-22 mins $Therapeutic Activity: 8-22 mins                    D. Scott Novella Abraha PT, DPT 05/25/22, 3:03 PM

## 2022-05-25 NOTE — Plan of Care (Signed)

## 2022-05-25 NOTE — Progress Notes (Signed)
   Subjective: 2 Days Post-Op Procedure(s) (LRB): INTRAMEDULLARY (IM) NAIL INTERTROCHANTERIC (Right) Patient is a difficult historian. Sleeping this am.  We will continue therapy today.  Plan is to go Skilled nursing facility after hospital stay.  Objective: Vital signs in last 24 hours: Temp:  [98.7 F (37.1 C)-98.8 F (37.1 C)] 98.7 F (37.1 C) (04/23 2355) Pulse Rate:  [60-82] 82 (04/23 2355) Resp:  [16-20] 20 (04/23 2355) BP: (130-141)/(69-77) 141/69 (04/23 2355) SpO2:  [94 %-96 %] 94 % (04/23 2355)  Intake/Output from previous day: 04/23 0701 - 04/24 0700 In: 300 [IV Piggyback:300] Out: 0  Intake/Output this shift: No intake/output data recorded.  Recent Labs    05/22/22 1306 05/23/22 0507 05/24/22 0619 05/25/22 0434  HGB 12.2 10.5* 9.9* 8.4*   Recent Labs    05/24/22 0619 05/25/22 0434  WBC 21.4* 13.7*  RBC 3.30* 2.81*  HCT 27.2* 23.8*  PLT 229 230   Recent Labs    05/24/22 0619 05/25/22 0434  NA 131* 134*  K 3.1* 4.4  CL 98 103  CO2 24 25  BUN 21 28*  CREATININE 0.89 1.01*  GLUCOSE 183* 108*  CALCIUM 8.3* 8.4*   Recent Labs    05/22/22 1617  INR 1.2    EXAM General - Patient is Confused Extremity - Intact pulses distally No cellulitis present Compartment soft No significant swelling or bruising - homans sign Dressing - dressing C/D/I and no drainage   Past Medical History:  Diagnosis Date   Breast cancer 2000   right   Breast screening, unspecified    Cancer 2000   excision upper inner quadrant right breast cancer   Cancer 2000   wide excision,sn biopsy and axillary dissection   Dementia    Hypertension 2010   Malignant neoplasm of upper-inner quadrant of female breast 2000   Obesity, unspecified    Personal history of chemotherapy    Personal history of malignant neoplasm of breast 2000   Personal history of radiation therapy    Personal history of tobacco use, presenting hazards to health    Special screening for  malignant neoplasms, colon    Thyroid disease 2012   hyperactive thyroid    Assessment/Plan:   2 Days Post-Op Procedure(s) (LRB): INTRAMEDULLARY (IM) NAIL INTERTROCHANTERIC (Right) Principal Problem:   Closed right hip fracture Active Problems:   Acute metabolic encephalopathy   Hypertension   Acquired hypothyroidism   Protein-calorie malnutrition, severe   Fall at home, initial encounter   Leukocytosis   Right rib fracture   Chronic diastolic CHF (congestive heart failure)   Dementia   Depression with anxiety   Intertrochanteric fracture of right hip, closed, initial encounter  Estimated body mass index is 20.23 kg/m as calculated from the following:   Height as of this encounter:  (1.473 m).   Weight as of this encounter: 43.9 kg. Advance diet Up with therapy Pain controlled Vital signs stable Hgb stable, 8.4, trending down. Recheck Hgb in the am CM to assist with discharge to SNF  Follow up with [facility] in 2 weeks WBAT RLE Lovenox 40 mg SQ daily x 14 days at discharge  DVT Prophylaxis - Lovenox, TED hose, and SCDs Weight-Bearing as tolerated to right leg   T. Cranston Neighbor, PA-C [facility] 05/25/2022, 8:00 AM

## 2022-05-25 NOTE — Progress Notes (Signed)
Physical Therapy Treatment Patient Details Name: Lisa Martin MRN: [id number] DOB: [birth year] Today's Date: 05/25/2022   History of Present Illness Pt is an 89 y.o. female with medical history significant of dementia, HTN, dCHF, hypothyroidism, depression with anxiety, GERD, breast cancer (s/p of radiation and chemotherapy), who presents with fall with right hip pain and confusion. Pt diagnosed with right hip Intertrochanteric fracture and is s/p IM nail.  MD assessment also includes acute metabolic encephalopathy, HTN, R rib fracture, and severe protein-calorie malnutrition.    PT Comments    Pt presented with a flat affect this session but put forth good effort during the session.  Pt required extensive cuing to follow commands and continued to present with posterior instability in sitting and standing.  Pt was able to amb 3 x 6 feet this session with heavy lean on UE support utilizing +2 HHA secondary to pt unable to sequencing use of walker.  Pt will benefit from continued PT services upon discharge to safely address deficits listed in patient problem list for decreased caregiver assistance and eventual return to PLOF.     Recommendations for follow up therapy are one component of a multi-disciplinary discharge planning process, led by the attending physician.  Recommendations may be updated based on patient status, additional functional criteria and insurance authorization.  Follow Up Recommendations  Can patient physically be transported by private vehicle: No    Assistance Recommended at Discharge Frequent or constant Supervision/Assistance  Patient can return home with the following A lot of help with walking and/or transfers;A lot of help with bathing/dressing/bathroom;Assistance with cooking/housework;Assistance with feeding;Direct supervision/assist for medications management;Direct supervision/assist for financial management;Assist for transportation;Help with stairs or ramp for  entrance   Equipment Recommendations  Other (comment) (TBD)    Recommendations for Other Services       Precautions / Restrictions Precautions Precautions: Fall Restrictions Weight Bearing Restrictions: Yes RLE Weight Bearing: Weight bearing as tolerated     Mobility  Bed Mobility Overal bed mobility: Needs Assistance Bed Mobility: Supine to Sit     Supine to sit: Min assist     General bed mobility comments: Max multi-modal cuing for sequencing with Min A for BLE and trunk control during sup to sit    Transfers Overall transfer level: Needs assistance Equipment used: 2 person hand held assist Transfers: Sit to/from Stand Sit to Stand: Mod assist           General transfer comment: Mod A to prevent posterior LOB    Ambulation/Gait Ambulation/Gait assistance: Mod assist, +2 physical assistance Gait Distance (Feet): 6 Feet x 3 Assistive device: 2 person hand held assist Gait Pattern/deviations: Step-through pattern, Decreased step length - left, Decreased stance time - right, Antalgic Gait velocity: decreased     General Gait Details: Heavy lean on BUEs with +2 HHA and Mod A for stability; pt antalgic on the RLE with decreased stance time and minor buckling during SLS on the RLE   Stairs             Wheelchair Mobility    Modified Rankin (Stroke Patients Only)       Balance Overall balance assessment: Needs assistance, History of Falls   Sitting balance-Leahy Scale: Poor   Postural control: Posterior lean Standing balance support: Bilateral upper extremity supported, During functional activity, Reliant on assistive device for balance Standing balance-Leahy Scale: Poor  Cognition Arousal/Alertness: Awake/alert Behavior During Therapy: Flat affect Overall Cognitive Status: No family/caregiver present to determine baseline cognitive functioning                                           Exercises Total Joint Exercises Hip ABduction/ADduction: AAROM, Strengthening, Both, 10 reps Straight Leg Raises: AAROM, Strengthening, Both, 10 reps    General Comments        Pertinent Vitals/Pain Pain Assessment Pain Assessment: PAINAD Breathing: occasional labored breathing, short period of hyperventilation Negative Vocalization: occasional moan/groan, low speech, negative/disapproving quality Facial Expression: smiling or inexpressive Body Language: relaxed Consolability: distracted or reassured by voice/touch PAINAD Score: 3 Pain Location: R hip Pain Descriptors / Indicators: Guarding, Moaning Pain Intervention(s): Repositioned, Premedicated before session, Monitored during session, Ice applied    Home Living                          Prior Function            PT Goals (current goals can now be found in the care plan section) Progress towards PT goals: Progressing toward goals    Frequency    BID      PT Plan Current plan remains appropriate    Co-evaluation              AM-PAC PT "6 Clicks" Mobility   Outcome Measure  Help needed turning from your back to your side while in a flat bed without using bedrails?: A Little Help needed moving from lying on your back to sitting on the side of a flat bed without using bedrails?: A Little Help needed moving to and from a bed to a chair (including a wheelchair)?: A Little Help needed standing up from a chair using your arms (e.g., wheelchair or bedside chair)?: A Little Help needed to walk in hospital room?: A Lot Help needed climbing 3-5 steps with a railing? : Total 6 Click Score: 15    End of Session Equipment Utilized During Treatment: Gait belt Activity Tolerance: Patient tolerated treatment well Patient left: with call bell/phone within reach;Other (comment);with SCD's reapplied;in chair;with chair alarm set (ice to R hip, safety mittens donned, nsg notified) Nurse Communication:  Mobility status;Weight bearing status PT Visit Diagnosis: Unsteadiness on feet (R26.81);History of falling (Z91.81);Other abnormalities of gait and mobility (R26.89);Muscle weakness (generalized) (M62.81)     Time: 1610-9604 PT Time Calculation (min) (ACUTE ONLY): 28 min  Charges:  $Therapeutic Exercise: 8-22 mins $Therapeutic Activity: 8-22 mins                     D. Scott Pilar Corrales PT, DPT 05/25/22, 10:31 AM

## 2022-05-25 NOTE — Progress Notes (Addendum)
[facility] Progress Note  Patient: Lisa Martin    ZOX:[id number]  DOA: 05/22/2022     Date of Service: the patient was seen and examined on 05/25/2022  Chief Complaint  Patient presents with   Fall   Brief hospital course: [name] is a 89 y.o. female with medical history significant of dementia, HTN, dCHF, hypothyroidism, depression with anxiety, GERD, breast cancer (s/p of radiation and chemotherapy), who presents with fall, right hip pain and confusion. Per her daughter at bedside, pt fell accidentally when she was walking around her wheelchair at about 9:00 PM yesterday. No LOC. X-ray of right hip/pelvis: Acute comminuted intertrochanteric fracture of the proximal right femur with varus angulation. X-ray of right femur is negatve for femoral shaft fracture.  Patient is admitted to telemetry bed as inpatient.    Assessment and Plan:  Closed right hip fracture: X-ray of right hip/pelvis showed acute comminuted intertrochanteric fracture of the proximal right femur with varus angulation. Orthopedic surgeon, Dr. Audelia Acton is consulted,  Patient's GUPTA score perioperative myocardial infarction or cardaic arrest is 2.63 %.  S/p  Right Hip Intramedullary nail done on 4/22 Follow up with [facility] in 2 weeks, WBAT RLE, Lovenox 40 mg SQ daily x 14 days at discharge PT and OT eval for SNF placement TOC consulted for placement  Anemia most likely due to acute blood loss postop Hb 10.5--8.4 Monitor H&H and transfuse if hemoglobin less than 7 Hold Lovenox for 1 to 2 days   Fall at home, initial encounter: -PT/OT eval for placement, Fall precaution -CK level 113 wnl    Chronic diastolic CHF (congestive heart failure): 2D echo on 12/11/2017 showed EF> 55% with grade 1 diastolic dysfunction.  Patient has mildly elevated BNP 204, but no leg edema or JVD.  No shortness of breath.  CHF is compensated. -monitor volume status    Acute metabolic encephalopathy -Frequent  neurochecks -urinalysis negative    Hypertension -IV hydralazine as needed -Dilt-XR   Acquired hypothyroidism -Synthroid   Leukocytosis: WBC 12.7, no fever.  So far no source of infection identified, likely reactive. -urinalysis negative    Right rib fracture: Chest x-ray showed subacute right lateral 9th and 10th rib fractures -Incentive spirometry -Pain control   Dementia -Fall precaution -Namenda, Exelon   Depression and anxiety -Continue home medications   Protein-calorie malnutrition, severe: Body weight 40 kg, BMI 17.22 -Ensure -Nutrition consult  Vitamin D deficiency: started vitamin D 50,000 units p.o. weekly, follow with PCP to repeat vitamin D level after 3 to 6 months.   Body mass index is 20.23 kg/m.  Nutrition Problem: Severe Malnutrition Etiology: chronic illness (dementia, CHF) Interventions: Interventions: Ensure Enlive (each supplement provides 350kcal and 20 grams of protein), MVI      Diet: Regular diet DVT Prophylaxis: Subcutaneous Lovenox   Advance goals of care discussion: Full code  Family Communication: family was present at bedside, at the time of interview.  The pt provided permission to discuss medical plan with the family. Opportunity was given to ask question and all questions were answered satisfactorily.   Disposition:  Pt is from Home, admitted with Fall and right Hip fracture, patient will need SNF placement, which precludes a safe discharge. Discharge to SNF, TBD after PT/OT eval, can be discharged when bed will be available.   Subjective: No significant events overnight, patient was sitting audibly in the recliner, AAO x 1 at baseline no any complaints.    Physical Exam: General: NAD, lying comfortably Appear in  no distress, affect appropriate Eyes: PERRLA ENT: Oral Mucosa Clear, moist  Neck: no JVD,  Cardiovascular: S1 and S2 Present, no Murmur,  Respiratory: good respiratory effort, Bilateral Air entry equal and  Decreased, no Crackles, no wheezes Abdomen: Bowel Sound present, Soft and no tenderness,  Skin: no rashes Extremities: no Pedal edema, no calf tenderness, right hip dressing CDI Neurologic: without any new focal findings Gait not checked due to patient safety concerns  Vitals:   05/24/22 0619 05/24/22 0807 05/24/22 2355 05/25/22 0759  BP: (!) 173/81 130/77 (!) 141/69 (!) 127/55  Pulse: 80 60 82 60  Resp:  Temp:  98.8 F (37.1 C) 98.7 F (37.1 C) 98 F (36.7 C)  TempSrc:  Axillary    SpO2:  96% 94% 100%  Weight:      Height:        Intake/Output Summary (Last 24 hours) at 05/25/2022 1354 Last data filed at 05/24/2022 2024 Gross per 24 hour  Intake 100 ml  Output 0 ml  Net 100 ml   Filed Weights   05/22/22 1648 05/23/22 1104  Weight: 43.9 kg 43.9 kg    Data Reviewed: I have personally reviewed and interpreted daily labs, tele strips, imagings as discussed above. I reviewed all nursing notes, pharmacy notes, vitals, pertinent old records I have discussed plan of care as described above with RN and patient/family.  CBC: Recent Labs  Lab 05/22/22 1306 05/23/22 0507 05/24/22 0619 05/25/22 0434  WBC 12.7* 11.4* 21.4* 13.7*  NEUTROABS 10.4*  --   --   --   HGB 12.2 10.5* 9.9* 8.4*  HCT 35.6* 29.8* 27.2* 23.8*  MCV 85.0 84.7 82.4 84.7  PLT 272 203 229 230   Basic Metabolic Panel: Recent Labs  Lab 05/22/22 1306 05/23/22 0507 05/23/22 0912 05/24/22 0619 05/25/22 0434  NA 134* 136  --  131* 134*  K 3.7 3.2*  --  3.1* 4.4  CL 100 104  --  98 103  CO2 23 22  --  24 25  GLUCOSE 126* 106*  --  183* 108*  BUN 13 16  --  21 28*  CREATININE 0.71 0.80  --  0.89 1.01*  CALCIUM 8.8* 8.1*  --  8.3* 8.4*  MG  --   --  1.9 2.0 2.1  PHOS  --   --  3.9 2.8 2.7    Studies: No results found.  Scheduled Meds:  cyanocobalamin  1,000 mcg Oral Daily   diltiazem  120 mg Oral Daily   docusate sodium  100 mg Oral BID   enoxaparin (LOVENOX) injection  30 mg  Subcutaneous Q24H   feeding supplement  237 mL Oral BID BM   levothyroxine  88 mcg Oral Daily   lidocaine  1 patch Transdermal Q24H   memantine  5 mg Oral BID   multivitamin with minerals  1 tablet Oral Daily   pantoprazole  40 mg Oral Daily   potassium chloride  40 mEq Oral Once   QUEtiapine  25 mg Oral QHS   rivastigmine  3 mg Oral BID   sertraline  25 mg Oral Daily   Vitamin D (Ergocalciferol)  50,000 Units Oral Q7 days   Continuous Infusions:   PRN Meds: acetaminophen, albuterol, ALPRAZolam, hydrALAZINE, HYDROcodone-acetaminophen, menthol-cetylpyridinium **OR** phenol, methocarbamol, metoCLOPramide **OR** metoCLOPramide (REGLAN) injection, morphine injection, ondansetron (ZOFRAN) IV, ondansetron **OR** ondansetron (ZOFRAN) IV, senna-docusate, traMADol  Time spent: 35 minutes  Author: Gillis Santa. MD [facility] 05/25/2022 1:54 PM  To reach On-call, see care teams to locate the attending and reach out to them via [email or web address]. If 7PM-7AM, please contact night-coverage If you still have difficulty reaching the attending provider, please page the [facility] (Director on Call) for [facility] on amion for assistance.

## 2022-05-26 DIAGNOSIS — S72001A Fracture of unspecified part of neck of right femur, initial encounter for closed fracture: Secondary | ICD-10-CM | POA: Diagnosis not present

## 2022-05-26 LAB — BASIC METABOLIC PANEL
Anion gap: 7 (ref 5–15)
BUN: 24 mg/dL — ABNORMAL HIGH (ref 8–23)
CO2: 27 mmol/L (ref 22–32)
Calcium: 8.4 mg/dL — ABNORMAL LOW (ref 8.9–10.3)
Chloride: 100 mmol/L (ref 98–111)
Creatinine, Ser: 0.83 mg/dL (ref 0.44–1.00)
GFR, Estimated: >60 mL/min (ref >60)
Glucose, Bld: 123 mg/dL — ABNORMAL HIGH (ref 70–99)
Potassium: 3.6 mmol/L (ref 3.5–5.1)
Sodium: 134 mmol/L — ABNORMAL LOW (ref 135–145)

## 2022-05-26 LAB — CBC
HCT: 27.5 % — ABNORMAL LOW (ref 36.0–46.0)
Hemoglobin: 9.7 g/dL — ABNORMAL LOW (ref 12.0–15.0)
MCH: 29.8 pg (ref 26.0–34.0)
MCHC: 35.3 g/dL (ref 30.0–36.0)
MCV: 84.4 fL (ref 80.0–100.0)
Platelets: 279 10*3/uL (ref 150–400)
RBC: 3.26 MIL/uL — ABNORMAL LOW (ref 3.87–5.11)
RDW: 13.8 % (ref 11.5–15.5)
WBC: 12.7 10*3/uL — ABNORMAL HIGH (ref 4.0–10.5)
nRBC: 0 % (ref 0.0–0.2)

## 2022-05-26 LAB — MAGNESIUM: Magnesium: 2.1 mg/dL (ref 1.7–2.4)

## 2022-05-26 LAB — PHOSPHORUS: Phosphorus: 1.8 mg/dL — ABNORMAL LOW (ref 2.5–4.6)

## 2022-05-26 MED ORDER — HYDRALAZINE HCL 50 MG PO TABS
50.0000 mg | ORAL_TABLET | Freq: Four times a day (QID) | ORAL | Status: DC | PRN
  Administered 2022-05-27: 50 mg via ORAL
  Filled 2022-05-26: qty 1

## 2022-05-26 MED ORDER — TRAMADOL HCL 50 MG PO TABS: 50.0000 mg | ORAL_TABLET | Freq: Two times a day (BID) | ORAL | 0 refills | Status: DC | PRN

## 2022-05-26 MED ORDER — K PHOS MONO-SOD PHOS DI & MONO 155-852-130 MG PO TABS
500.0000 mg | ORAL_TABLET | Freq: Four times a day (QID) | ORAL | Status: AC
  Administered 2022-05-26 (×3): 500 mg via ORAL
  Filled 2022-05-26 (×3): qty 2

## 2022-05-26 MED ORDER — HYDRALAZINE HCL 20 MG/ML IJ SOLN
10.0000 mg | Freq: Four times a day (QID) | INTRAMUSCULAR | Status: DC | PRN
  Administered 2022-05-27: 10 mg via INTRAVENOUS
  Filled 2022-05-26: qty 1

## 2022-05-26 MED ORDER — POTASSIUM PHOSPHATES 15 MMOLE/5ML IV SOLN
30.0000 mmol | Freq: Once | INTRAVENOUS | Status: AC
  Administered 2022-05-26: 30 mmol via INTRAVENOUS
  Filled 2022-05-26: qty 10

## 2022-05-26 MED ORDER — ACETAMINOPHEN 325 MG PO TABS: 325.0000 mg | ORAL_TABLET | Freq: Four times a day (QID) | ORAL | Status: DC | PRN

## 2022-05-26 MED ORDER — ENOXAPARIN SODIUM 30 MG/0.3ML IJ SOSY: 30.0000 mg | PREFILLED_SYRINGE | Freq: Every evening | INTRAMUSCULAR | 0 refills | Status: DC

## 2022-05-26 NOTE — Progress Notes (Signed)
Physical Therapy Treatment Patient Details Name: Lisa Martin MRN: [id number] DOB: 05/28/1931 Today's Date: 05/26/2022   History of Present Illness Pt is an 89 y.o. female with medical history significant of dementia, HTN, dCHF, hypothyroidism, depression with anxiety, GERD, breast cancer (s/p of radiation and chemotherapy), who presents with fall with right hip pain and confusion. Pt diagnosed with right hip Intertrochanteric fracture and is s/p IM nail.  MD assessment also includes acute metabolic encephalopathy, HTN, R rib fracture, and severe protein-calorie malnutrition.    PT Comments    Patient is confused but cooperative with session. Assistance required with bed mobility. Limited standing tolerance for progression of ambulation. Activity limited by fatigue and pain with weight bearing. Recommend to continue PT to maximize independence and facilitate return to prior level of function.    Recommendations for follow up therapy are one component of a multi-disciplinary discharge planning process, led by the attending physician.  Recommendations may be updated based on patient status, additional functional criteria and insurance authorization.  Follow Up Recommendations  Can patient physically be transported by private vehicle: No    Assistance Recommended at Discharge Frequent or constant Supervision/Assistance  Patient can return home with the following A lot of help with walking and/or transfers;A lot of help with bathing/dressing/bathroom;Assistance with cooking/housework;Assistance with feeding;Direct supervision/assist for medications management;Direct supervision/assist for financial management;Assist for transportation;Help with stairs or ramp for entrance   Equipment Recommendations  Other (comment)    Recommendations for Other Services       Precautions / Restrictions Precautions Precautions: Fall Restrictions Weight Bearing Restrictions: Yes RLE Weight Bearing: Weight  bearing as tolerated     Mobility  Bed Mobility Overal bed mobility: Needs Assistance Bed Mobility: Supine to Sit, Sit to Supine     Supine to sit: Min assist Sit to supine: Mod assist   General bed mobility comments: cues for sequencing and technique. increased assistance required for return to bed    Transfers Overall transfer level: Needs assistance Equipment used: None Transfers: Sit to/from Stand Sit to Stand: Mod assist           General transfer comment: lifting and lowering assistance provided. cues for task initiation    Ambulation/Gait Ambulation/Gait assistance: Mod assist   Assistive device: 1 person hand held assist Gait Pattern/deviations: Decreased stance time - right       General Gait Details: patient able to take one step to the right and to the left. unable to advance forward secondary to pain with weight bearing on RLE, limited standing tolerance. cues and faciliation for weight shifting and foot placement   Stairs             Wheelchair Mobility    Modified Rankin (Stroke Patients Only)       Balance Overall balance assessment: Needs assistance Sitting-balance support: Feet supported Sitting balance-Leahy Scale: Fair     Standing balance support: Bilateral upper extremity supported, During functional activity Standing balance-Leahy Scale: Poor Standing balance comment: external support required to maintain standing balance                            Cognition Arousal/Alertness: Awake/alert Behavior During Therapy: Flat affect Overall Cognitive Status: No family/caregiver present to determine baseline cognitive functioning                                 General Comments: Patient is  confused but cooperative. oriented to self only. able to follow single step commands with extra time        Exercises      General Comments        Pertinent Vitals/Pain Pain Assessment Pain Assessment:  Faces Faces Pain Scale: Hurts a little bit Pain Location: R hip Pain Descriptors / Indicators: Discomfort Pain Intervention(s): Limited activity within patient's tolerance, Monitored during session, Repositioned    Home Living                          Prior Function            PT Goals (current goals can now be found in the care plan section) Acute Rehab PT Goals Patient Stated Goal: none stated PT Goal Formulation: Patient unable to participate in goal setting Time For Goal Achievement: 06/06/22 Potential to Achieve Goals: Good Progress towards PT goals: Progressing toward goals    Frequency    BID      PT Plan Current plan remains appropriate    Co-evaluation              AM-PAC PT "6 Clicks" Mobility   Outcome Measure  Help needed turning from your back to your side while in a flat bed without using bedrails?: A Little Help needed moving from lying on your back to sitting on the side of a flat bed without using bedrails?: A Little Help needed moving to and from a bed to a chair (including a wheelchair)?: A Little Help needed standing up from a chair using your arms (e.g., wheelchair or bedside chair)?: A Little Help needed to walk in hospital room?: A Lot Help needed climbing 3-5 steps with a railing? : Total 6 Click Score: 15    End of Session   Activity Tolerance: Patient tolerated treatment well Patient left: in bed;with call bell/phone within reach;with bed alarm set   PT Visit Diagnosis: Unsteadiness on feet (R26.81);History of falling (Z91.81);Other abnormalities of gait and mobility (R26.89);Muscle weakness (generalized) (M62.81)     Time: 1610-9604 PT Time Calculation (min) (ACUTE ONLY): 16 min  Charges:  $Therapeutic Activity: 8-22 mins                     Donna Bernard, PT, MPT    Ina Homes 05/26/2022, 12:59 PM

## 2022-05-26 NOTE — TOC Initial Note (Signed)
Transition of Care [facility]) - Initial/Assessment Note    Patient Details  Name: Lisa Martin MRN: [id number] Date of Birth: 16-Oct-1931  Transition of Care [facility]) CM/SW Contact:    Garret Reddish, RN Phone Number: 05/26/2022, 10:20 AM  Clinical Narrative:   I have spoken with patient's daughter Lisa Martin.  Mrs. Lisa Martin informs me that prior to admission patient lived at home with her.  She reports that patient was able to get around with a walker.  Lisa Martin reports that she would have to assist with bathing and dressing patient. Lisa Martin reports that patient was able to feed herself.  Lisa Martin took patient to her appointments.  Lisa Martin reports patient uses [facility] medications. Patient's PCP is Bethann Punches.  Lisa Martin reports that [facility] was providing Home Health PT and RN. Lisa Martin reports that she would like her mother to go to [facility] on discharge.  She reports that she has also been working on long-term placement for her mother at [facility].  Lisa Martin is agreeable to [facility] completing a bed search to [facility].  Lisa Martin does not want me to sent information to [facility].  Lisa Martin reports that she has spoken with [facility] and this facility is her preference.    Bed search completed to [facility] facilities exception for [facility].  FL 2 has been completed.    TOC will continue to follow for discharge planning.         Expected Discharge Plan: Skilled Nursing Facility Barriers to Discharge: No Barriers Identified   Patient Goals and CMS Choice     Choice offered to / list presented to : Adult Children      Expected Discharge Plan and Services   Discharge Planning Services: CM Consult Post Acute Care Choice: Skilled Nursing Facility Living arrangements for the past 2 months: Single Family Home                                      Prior Living Arrangements/Services Living arrangements for the  past 2 months: Single Family Home Lives with:: Adult Children (Patient lives with her daughter Lisa Martin)          Need for Family Participation in Patient Care: Yes (Comment) Care giver support system in place?: Yes (comment) (Patient has supportive daughter) Current home services: Home PT, Home RN (Patient is active with [facility] prior to admission)    Activities of Daily Living Home Assistive Devices/Equipment: Dan Humphreys (specify type) ADL Screening (condition at time of admission) Patient's cognitive ability adequate to safely complete daily activities?: No Is the patient deaf or have difficulty hearing?: No Does the patient have difficulty seeing, even when wearing glasses/contacts?: No Does the patient have difficulty concentrating, remembering, or making decisions?: Yes Patient able to express need for assistance with ADLs?: Yes Does the patient have difficulty dressing or bathing?: Yes Independently performs ADLs?: No Communication: Needs assistance Is this a change from baseline?: Pre-admission baseline Dressing (OT): Needs assistance Is this a change from baseline?: Pre-admission baseline Does the patient have difficulty walking or climbing stairs?: Yes Weakness of Legs: None Weakness of Arms/Hands: None  Permission Sought/Granted                  Emotional Assessment Appearance:: Appears stated age Attitude/Demeanor/Rapport: Other (comment) (Able to answer yes and no questions.  Patient only oriented to  her self at this time.) Affect (typically observed): Appropriate Orientation: : Oriented to Self      Admission diagnosis:  Closed right hip fracture [S72.001A] Intertrochanteric fracture of right hip, closed, initial encounter [S72.141A] Chronic dementia [F03.90] Patient Active Problem List   Diagnosis Date Noted   Intertrochanteric fracture of right hip, closed, initial encounter 05/23/2022   Closed right hip fracture 05/22/2022   Fall at home, initial  encounter 05/22/2022   Leukocytosis 05/22/2022   Right rib fracture 05/22/2022   Chronic diastolic CHF (congestive heart failure) 05/22/2022   Dementia 05/22/2022   Depression with anxiety 05/22/2022   Rash 04/05/2021   Compression fracture of body of thoracic vertebra 04/05/2021   Protein-calorie malnutrition, severe 04/05/2021   Acute metabolic encephalopathy 03/27/2021   Severe sepsis 03/27/2021   Community acquired pneumonia 03/27/2021   Incidental finding of COVID-19 virus infection 03/27/2021   Dehydration 03/27/2021   Acute prerenal azotemia 03/27/2021   Personal history of malignant neoplasm of breast 05/08/2013   Hypertension 2010   GERD (gastroesophageal reflux disease) 2010   Acquired hypothyroidism 2010   PCP:  Danella Penton, MD Pharmacy:   [facility] [address], [place] - [address] [place] [address] Phone: [phone] Fax: [phone]  [id number] [place], [place] - [address] AT [facility] [address] [place] [address] Phone: [phone] Fax: [phone]     Social Determinants of Health (SDOH) Social History: SDOH Screenings   Food Insecurity: No Food Insecurity (05/22/2022)  Housing: Low Risk  (05/23/2022)  Transportation Needs: No Transportation Needs (05/22/2022)  Utilities: Not At Risk (05/22/2022)  Tobacco Use: Medium Risk (05/24/2022)   SDOH Interventions: Housing Interventions: Intervention Not Indicated   Readmission Risk Interventions     No data to display

## 2022-05-26 NOTE — Progress Notes (Addendum)
Subjective: 3 Days Post-Op Procedure(s) (LRB): INTRAMEDULLARY (IM) NAIL INTERTROCHANTERIC (Right) Patient is a difficult historian. Sleeping this am.  We will continue therapy today.  Plan is to go Skilled nursing facility after hospital stay.  Objective: Vital signs in last 24 hours: Temp:  [97.7 F (36.5 C)-98.1 F (36.7 C)] 97.8 F (36.6 C) (04/25 0738) Pulse Rate:  [60-91] 91 (04/25 0738) Resp:  [16-18] 18 (04/25 0738) BP: (134-167)/(59-74) 167/74 (04/25 0738) SpO2:  [99 %-100 %] 99 % (04/25 0738)  Intake/Output from previous day: 04/24 0701 - 04/25 0700 In: 240 [P.O.:240] Out: -  Intake/Output this shift: No intake/output data recorded.  Recent Labs    05/24/22 0619 05/25/22 0434 05/26/22 0423  HGB 9.9* 8.4* 9.7*   Recent Labs    05/25/22 0434 05/26/22 0423  WBC 13.7* 12.7*  RBC 2.81* 3.26*  HCT 23.8* 27.5*  PLT 230 279   Recent Labs    05/25/22 0434 05/26/22 0423  NA 134* 134*  K 4.4 3.6  CL 103 100  CO2 25 27  BUN 28* 24*  CREATININE 1.01* 0.83  GLUCOSE 108* 123*  CALCIUM 8.4* 8.4*   No results for input(s): "LABPT", "INR" in the last 72 hours.   EXAM General - Patient is Confused Extremity - Intact pulses distally No cellulitis present Compartment soft No significant swelling or bruising - homans sign Dressing - dressing C/D/I and no drainage   Past Medical History:  Diagnosis Date   Breast cancer 2000   right   Breast screening, unspecified    Cancer 2000   excision upper inner quadrant right breast cancer   Cancer 2000   wide excision,sn biopsy and axillary dissection   Dementia    Hypertension 2010   Malignant neoplasm of upper-inner quadrant of female breast 2000   Obesity, unspecified    Personal history of chemotherapy    Personal history of malignant neoplasm of breast 2000   Personal history of radiation therapy    Personal history of tobacco use, presenting hazards to health    Special screening for malignant  neoplasms, colon    Thyroid disease 2012   hyperactive thyroid    Assessment/Plan:   3 Days Post-Op Procedure(s) (LRB): INTRAMEDULLARY (IM) NAIL INTERTROCHANTERIC (Right) Principal Problem:   Closed right hip fracture Active Problems:   Acute metabolic encephalopathy   Hypertension   Acquired hypothyroidism   Protein-calorie malnutrition, severe   Fall at home, initial encounter   Leukocytosis   Right rib fracture   Chronic diastolic CHF (congestive heart failure)   Dementia   Depression with anxiety   Intertrochanteric fracture of right hip, closed, initial encounter  Estimated body mass index is 20.23 kg/m as calculated from the following:   Height as of this encounter:  (1.473 m).   Weight as of this encounter: 43.9 kg. Advance diet Up with therapy Pain controlled Vital signs stable  Hgb stable, 9.7, trending up. Stable   CM to assist with discharge to SNF  Follow up with [facility] in 2 weeks WBAT RLE Lovenox 40 mg SQ daily x 14 days at discharge  DVT Prophylaxis - Lovenox, TED hose, and SCDs Weight-Bearing as tolerated to right leg   T. Cranston Neighbor, PA-C [facility] 05/26/2022, 8:10 AM  Patient seen and examined, agree with above plan.  The patient is doing well status post right hip intramedullary nail, no concerns at this time.  Pain is controlled.  H/H stable. Orthopedically stable for discharge at medical  teams discretion.    Reinaldo Berber MD

## 2022-05-26 NOTE — Progress Notes (Signed)
Physical Therapy Treatment Patient Details Name: Lisa Martin MRN: [id number] DOB: 1931-02-18 Today's Date: 05/26/2022   History of Present Illness Pt is an 89 y.o. female with medical history significant of dementia, HTN, dCHF, hypothyroidism, depression with anxiety, GERD, breast cancer (s/p of radiation and chemotherapy), who presents with fall with right hip pain and confusion. Pt diagnosed with right hip Intertrochanteric fracture and is s/p IM nail.  MD assessment also includes acute metabolic encephalopathy, HTN, R rib fracture, and severe protein-calorie malnutrition.    PT Comments    Patient seen for second PT session. Patient able to stand and take several steps with assistance. Decreased weight acceptance on RLE. Limited standing tolerance for progression of further mobility while standing. PT will continue to follow to maximize independence and decrease caregiver burden.    Recommendations for follow up therapy are one component of a multi-disciplinary discharge planning process, led by the attending physician.  Recommendations may be updated based on patient status, additional functional criteria and insurance authorization.  Follow Up Recommendations  Can patient physically be transported by private vehicle: No    Assistance Recommended at Discharge Frequent or constant Supervision/Assistance  Patient can return home with the following A lot of help with walking and/or transfers;A lot of help with bathing/dressing/bathroom;Assistance with cooking/housework;Assistance with feeding;Direct supervision/assist for medications management;Direct supervision/assist for financial management;Assist for transportation;Help with stairs or ramp for entrance   Equipment Recommendations  Other (comment)    Recommendations for Other Services       Precautions / Restrictions Precautions Precautions: Fall Restrictions Weight Bearing Restrictions: Yes RLE Weight Bearing: Weight bearing as  tolerated     Mobility  Bed Mobility Overal bed mobility: Needs Assistance Bed Mobility: Supine to Sit, Sit to Supine     Supine to sit: Mod assist Sit to supine: Mod assist   General bed mobility comments: cues for sequencing and technique. assistance required for trunk and BLE support    Transfers Overall transfer level: Needs assistance Equipment used: 1 person hand held assist Transfers: Sit to/from Stand Sit to Stand: Mod assist           General transfer comment: lifting and lowering assistance provided. cues for task initiation    Ambulation/Gait Ambulation/Gait assistance: Mod assist   Assistive device: 1 person hand held assist Gait Pattern/deviations: Decreased stance time - right, Narrow base of support Gait velocity: decreased     General Gait Details: difficulty with weight shifting with faciliation provided. patient able to take several side steps along edge of bed to right and left. easily distracted with limited standing tolerance and need to sit quickly.   Stairs             Wheelchair Mobility    Modified Rankin (Stroke Patients Only)       Balance Overall balance assessment: Needs assistance Sitting-balance support: Feet supported Sitting balance-Leahy Scale: Fair     Standing balance support: Bilateral upper extremity supported, During functional activity Standing balance-Leahy Scale: Poor Standing balance comment: external support required to maintain standing balance                            Cognition Arousal/Alertness: Awake/alert Behavior During Therapy: Flat affect Overall Cognitive Status: No family/caregiver present to determine baseline cognitive functioning  General Comments: Patient is cooperative. easily distracted and confused but able to follow single step commands with multi modal cues        Exercises      General Comments General comments (skin  integrity, edema, etc.): patient does appear fatigued with minimal activity after returning to bed. bilateral mitts in place throughout session      Pertinent Vitals/Pain Pain Assessment Pain Assessment: Faces Faces Pain Scale: Hurts a little bit Pain Location: R hip Pain Descriptors / Indicators: Discomfort Pain Intervention(s): Limited activity within patient's tolerance, Monitored during session    Home Living                          Prior Function            PT Goals (current goals can now be found in the care plan section) Acute Rehab PT Goals Patient Stated Goal: none stated PT Goal Formulation: Patient unable to participate in goal setting Time For Goal Achievement: 06/06/22 Potential to Achieve Goals: Good Progress towards PT goals: Progressing toward goals    Frequency    BID      PT Plan Current plan remains appropriate    Co-evaluation              AM-PAC PT "6 Clicks" Mobility   Outcome Measure  Help needed turning from your back to your side while in a flat bed without using bedrails?: A Little Help needed moving from lying on your back to sitting on the side of a flat bed without using bedrails?: A Little Help needed moving to and from a bed to a chair (including a wheelchair)?: A Little Help needed standing up from a chair using your arms (e.g., wheelchair or bedside chair)?: A Little Help needed to walk in hospital room?: A Lot Help needed climbing 3-5 steps with a railing? : Total 6 Click Score: 15    End of Session Equipment Utilized During Treatment: Gait belt Activity Tolerance: Patient tolerated treatment well Patient left: in bed;with call bell/phone within reach;with bed alarm set Nurse Communication: Mobility status PT Visit Diagnosis: Unsteadiness on feet (R26.81);History of falling (Z91.81);Other abnormalities of gait and mobility (R26.89);Muscle weakness (generalized) (M62.81)     Time: 2130-8657 PT Time  Calculation (min) (ACUTE ONLY): 11 min  Charges:  $Therapeutic Activity: 8-22 mins                     Donna Bernard, PT, MPT    Ina Homes 05/26/2022, 2:00 PM

## 2022-05-26 NOTE — Plan of Care (Signed)
  Problem: Education: Goal: Knowledge of General Education information will improve Description: Including pain rating scale, medication(s)/side effects and non-pharmacologic comfort measures Outcome: Progressing   Problem: Clinical Measurements: Goal: Ability to maintain clinical measurements within normal limits will improve Outcome: Progressing Goal: Will remain free from infection Outcome: Progressing Goal: Diagnostic test results will improve Outcome: Progressing   Problem: Activity: Goal: Risk for activity intolerance will decrease Outcome: Progressing   Problem: Nutrition: Goal: Adequate nutrition will be maintained Outcome: Progressing   Problem: Coping: Goal: Level of anxiety will decrease Outcome: Progressing   Problem: Pain Managment: Goal: General experience of comfort will improve Outcome: Progressing   Problem: Safety: Goal: Ability to remain free from injury will improve Outcome: Progressing   Problem: Skin Integrity: Goal: Risk for impaired skin integrity will decrease Outcome: Progressing   Problem: Health Behavior/Discharge Planning: Goal: Ability to manage health-related needs will improve Outcome: Not Progressing

## 2022-05-26 NOTE — Progress Notes (Signed)
[facility] Progress Note  Patient: Lisa Martin:[id number]  DOA: 05/22/2022     Date of Service: the patient was seen and examined on 05/26/2022  Chief Complaint  Patient presents with   Fall   Brief hospital course: Lisa Martin is a 89 y.o. female with medical history significant of dementia, HTN, dCHF, hypothyroidism, depression with anxiety, GERD, breast cancer (s/p of radiation and chemotherapy), who presents with fall, right hip pain and confusion. Per her daughter at bedside, pt fell accidentally when she was walking around her wheelchair at about 9:00 PM yesterday. No LOC. X-ray of right hip/pelvis: Acute comminuted intertrochanteric fracture of the proximal right femur with varus angulation. X-ray of right femur is negatve for femoral shaft fracture.  Patient is admitted to telemetry bed as inpatient.    Assessment and Plan:  # Closed right hip fracture: X-ray of right hip/pelvis showed acute comminuted intertrochanteric fracture of the proximal right femur with varus angulation. Orthopedic surgeon, Dr. Audelia Acton is consulted,  Patient's GUPTA score perioperative myocardial infarction or cardaic arrest is 2.63 %.  S/p  Right Hip Intramedullary nail done on 4/22 Follow up with [facility] in 2 weeks, WBAT RLE, Lovenox 40 mg SQ daily x 14 days at discharge PT and OT eval for SNF placement TOC consulted for placement  # Anemia most likely due to acute blood loss postop Hb 10.5--8.4--9.7 Monitor H&H and transfuse if hemoglobin less than 7 Hold Lovenox for 1 to 2 days   # Fall at home, initial encounter: -PT/OT eval for placement, Fall precaution -CK level 113 wnl    # Chronic diastolic CHF (congestive heart failure): 2D echo on 12/11/2017 showed EF> 55% with grade 1 diastolic dysfunction.  Patient has mildly elevated BNP 204, but no leg edema or JVD.  No shortness of breath.  CHF is compensated. -monitor volume status    # Acute metabolic encephalopathy -Frequent  neurochecks -urinalysis negative    # Hypertension -IV hydralazine as needed -Dilt-XR   # Acquired hypothyroidism, On Synthroid Leukocytosis: WBC 12.7, no fever.  So far no source of infection identified, likely reactive. -urinalysis negative    # Right rib fracture: Chest x-ray showed subacute right lateral 9th and 10th rib fractures -Incentive spirometry -Pain control   # Dementia, continue Fall precaution, Namenda, Exelon # Depression and anxiety, Continue home medications   # Protein-calorie malnutrition, severe: Body weight 40 kg, BMI 17.22 -Ensure -Nutrition consult  # Hypophosphatemia, Phos repleted. Monitor electrolytes and replete as needed.  # Vitamin D deficiency: started vitamin D 50,000 units p.o. weekly, follow with PCP to repeat vitamin D level after 3 to 6 months.   Body mass index is 20.23 kg/m.  Nutrition Problem: Severe Malnutrition Etiology: chronic illness (dementia, CHF) Interventions: Interventions: Ensure Enlive (each supplement provides 350kcal and 20 grams of protein), MVI      Diet: Regular diet DVT Prophylaxis: Subcutaneous Lovenox   Advance goals of care discussion: Full code  Family Communication: family was present at bedside, at the time of interview.  The pt provided permission to discuss medical plan with the family. Opportunity was given to ask question and all questions were answered satisfactorily.   Disposition:  Pt is from Home, admitted with Fall and right Hip fracture, patient will need SNF placement, which precludes a safe discharge. Discharge to SNF, TBD after PT/OT eval, can be discharged when bed will be available.   Subjective: No significant events overnight, patient was laying comfortably in  the bed, denies any complaints.  AO x 1 at baseline due to significant dementia.  Unable to offer any complaints   Physical Exam: General: NAD, lying comfortably Appear in no distress, affect appropriate Eyes: PERRLA ENT:  Oral Mucosa Clear, moist  Neck: no JVD,  Cardiovascular: S1 and S2 Present, no Murmur,  Respiratory: good respiratory effort, Bilateral Air entry equal and Decreased, no Crackles, no wheezes Abdomen: Bowel Sound present, Soft and no tenderness,  Skin: no rashes Extremities: no Pedal edema, no calf tenderness, right hip dressing CDI Neurologic: without any new focal findings Gait not checked due to patient safety concerns  Vitals:   05/25/22 0759 05/25/22 1540 05/25/22 2332 05/26/22 0738  BP: (!) 127/55 (!) 136/59 134/62 (!) 167/74  Pulse: 60 60 62 91  Resp: 17 16 16 18   Temp: 98 F (36.7 C) 97.7 F (36.5 C) 98.1 F (36.7 C) 97.8 F (36.6 C)  TempSrc:      SpO2: 100% 100% 100% 99%  Weight:      Height:       No intake or output data in the 24 hours ending 05/26/22 1402  Filed Weights   05/22/22 1648 05/23/22 1104  Weight: 43.9 kg 43.9 kg    Data Reviewed: I have personally reviewed and interpreted daily labs, tele strips, imagings as discussed above. I reviewed all nursing notes, pharmacy notes, vitals, pertinent old records I have discussed plan of care as described above with RN and patient/family.  CBC: Recent Labs  Lab 05/22/22 1306 05/23/22 0507 05/24/22 0619 05/25/22 0434 05/26/22 0423  WBC 12.7* 11.4* 21.4* 13.7* 12.7*  NEUTROABS 10.4*  --   --   --   --   HGB 12.2 10.5* 9.9* 8.4* 9.7*  HCT 35.6* 29.8* 27.2* 23.8* 27.5*  MCV 85.0 84.7 82.4 84.7 84.4  PLT 272 203 229 230 279   Basic Metabolic Panel: Recent Labs  Lab 05/22/22 1306 05/23/22 0507 05/23/22 0912 05/24/22 0619 05/25/22 0434 05/26/22 0423  NA 134* 136  --  131* 134* 134*  K 3.7 3.2*  --  3.1* 4.4 3.6  CL 100 104  --  98 103 100  CO2 23 22  --  24 25 27   GLUCOSE 126* 106*  --  183* 108* 123*  BUN 13 16  --  21 28* 24*  CREATININE 0.71 0.80  --  0.89 1.01* 0.83  CALCIUM 8.8* 8.1*  --  8.3* 8.4* 8.4*  MG  --   --  1.9 2.0 2.1 2.1  PHOS  --   --  3.9 2.8 2.7 1.8*    Studies: No  results found.  Scheduled Meds:  cyanocobalamin  1,000 mcg Oral Daily   diltiazem  120 mg Oral Daily   docusate sodium  100 mg Oral BID   [START ON 05/28/2022] enoxaparin (LOVENOX) injection  30 mg Subcutaneous QPM   feeding supplement  237 mL Oral BID BM   levothyroxine  88 mcg Oral Daily   lidocaine  1 patch Transdermal Q24H   memantine  5 mg Oral BID   multivitamin with minerals  1 tablet Oral Daily   pantoprazole  40 mg Oral Daily   phosphorus  500 mg Oral QID   potassium chloride  40 mEq Oral Once   QUEtiapine  25 mg Oral QHS   rivastigmine  3 mg Oral BID   sertraline  25 mg Oral Daily   Vitamin D (Ergocalciferol)  50,000 Units Oral Q7 days   Continuous  Infusions:   PRN Meds: acetaminophen, albuterol, ALPRAZolam, hydrALAZINE, hydrALAZINE, HYDROcodone-acetaminophen, menthol-cetylpyridinium **OR** phenol, methocarbamol, metoCLOPramide **OR** metoCLOPramide (REGLAN) injection, morphine injection, ondansetron (ZOFRAN) IV, ondansetron **OR** ondansetron (ZOFRAN) IV, senna-docusate, traMADol  Time spent: 35 minutes  Author: Gillis Santa. MD [facility] 05/26/2022 2:02 PM  To reach On-call, see care teams to locate the attending and reach out to them via [email or web address]. If 7PM-7AM, please contact night-coverage If you still have difficulty reaching the attending provider, please page the [facility] (Director on Call) for [facility] on amion for assistance.

## 2022-05-26 NOTE — Plan of Care (Signed)

## 2022-05-27 DIAGNOSIS — S72001A Fracture of unspecified part of neck of right femur, initial encounter for closed fracture: Secondary | ICD-10-CM | POA: Diagnosis not present

## 2022-05-27 LAB — BASIC METABOLIC PANEL
Anion gap: 10 (ref 5–15)
BUN: 18 mg/dL (ref 8–23)
CO2: 26 mmol/L (ref 22–32)
Calcium: 8.3 mg/dL — ABNORMAL LOW (ref 8.9–10.3)
Chloride: 100 mmol/L (ref 98–111)
Creatinine, Ser: 0.6 mg/dL (ref 0.44–1.00)
GFR, Estimated: >60 mL/min (ref >60)
Glucose, Bld: 122 mg/dL — ABNORMAL HIGH (ref 70–99)
Potassium: 3.3 mmol/L — ABNORMAL LOW (ref 3.5–5.1)
Sodium: 136 mmol/L (ref 135–145)

## 2022-05-27 LAB — CBC
HCT: 28.9 % — ABNORMAL LOW (ref 36.0–46.0)
Hemoglobin: 10.1 g/dL — ABNORMAL LOW (ref 12.0–15.0)
MCH: 29.4 pg (ref 26.0–34.0)
MCHC: 34.9 g/dL (ref 30.0–36.0)
MCV: 84.3 fL (ref 80.0–100.0)
Platelets: 312 10*3/uL (ref 150–400)
RBC: 3.43 MIL/uL — ABNORMAL LOW (ref 3.87–5.11)
RDW: 13.9 % (ref 11.5–15.5)
WBC: 13.1 10*3/uL — ABNORMAL HIGH (ref 4.0–10.5)
nRBC: 0 % (ref 0.0–0.2)

## 2022-05-27 LAB — MAGNESIUM: Magnesium: 1.9 mg/dL (ref 1.7–2.4)

## 2022-05-27 LAB — PHOSPHORUS: Phosphorus: 2.9 mg/dL (ref 2.5–4.6)

## 2022-05-27 MED ORDER — AMLODIPINE BESYLATE 10 MG PO TABS: 10.0000 mg | ORAL_TABLET | Freq: Every day | ORAL | Status: DC

## 2022-05-27 MED ORDER — POTASSIUM CHLORIDE CRYS ER 20 MEQ PO TBCR
40.0000 meq | EXTENDED_RELEASE_TABLET | Freq: Once | ORAL | Status: AC
  Administered 2022-05-27: 40 meq via ORAL
  Filled 2022-05-27: qty 2

## 2022-05-27 MED ORDER — AMLODIPINE BESYLATE 10 MG PO TABS
10.0000 mg | ORAL_TABLET | Freq: Every day | ORAL | Status: DC
  Administered 2022-05-27 – 2022-06-01 (×6): 10 mg via ORAL
  Filled 2022-05-27 (×6): qty 1

## 2022-05-27 MED ORDER — ENOXAPARIN SODIUM 30 MG/0.3ML IJ SOSY
30.0000 mg | PREFILLED_SYRINGE | Freq: Every evening | INTRAMUSCULAR | Status: DC
  Administered 2022-05-27 – 2022-05-31 (×5): 30 mg via SUBCUTANEOUS
  Filled 2022-05-27 (×5): qty 0.3

## 2022-05-27 NOTE — Care Management Important Message (Signed)
Important Message  Patient Details  Name: Lisa Martin MRN: [id number] Date of Birth: [birth year]   Medicare Important Message Given:  Yes  I reviewed the Important Message from Medicare by phone with the patients POA Blenda Bridegroom, daughter [phone] and she stated she understood her rights. I thanked her for her time.    Olegario Messier A Renad Jenniges 05/27/2022, 2:34 PM

## 2022-05-27 NOTE — Plan of Care (Signed)

## 2022-05-27 NOTE — Progress Notes (Signed)
[facility] Progress Note  Patient: Lisa Martin    WUJ:[id number]  DOA: 05/22/2022     Date of Service: the patient was seen and examined on 05/27/2022  Chief Complaint  Patient presents with   Fall   Brief hospital course: Lisa Martin is a 89 y.o. female with medical history significant of dementia, HTN, dCHF, hypothyroidism, depression with anxiety, GERD, breast cancer (s/p of radiation and chemotherapy), who presents with fall, right hip pain and confusion. Per her daughter at bedside, pt fell accidentally when she was walking around her wheelchair at about 9:00 PM yesterday. No LOC. X-ray of right hip/pelvis: Acute comminuted intertrochanteric fracture of the proximal right femur with varus angulation. X-ray of right femur is negatve for femoral shaft fracture.  Patient is admitted to telemetry bed as inpatient.    Assessment and Plan:  # Closed right hip fracture: X-ray of right hip/pelvis showed acute comminuted intertrochanteric fracture of the proximal right femur with varus angulation. Orthopedic surgeon, Dr. Audelia Acton is consulted,  Patient's GUPTA score perioperative myocardial infarction or cardaic arrest is 2.63 %.  S/p  Right Hip Intramedullary nail done on 4/22 Follow up with [facility] in 2 weeks, WBAT RLE, Lovenox 40 mg SQ daily x 14 days at discharge PT and OT eval for SNF placement TOC consulted for placement  # Anemia most likely due to acute blood loss postop Hb 10.5--8.4--910.1 Monitor H&H and transfuse if hemoglobin less than 7 Resumed Lovenox    # Fall at home, initial encounter: -PT/OT eval for placement, Fall precaution -CK level 113 wnl    # Chronic diastolic CHF (congestive heart failure): 2D echo on 12/11/2017 showed EF> 55% with grade 1 diastolic dysfunction.  Patient has mildly elevated BNP 204, but no leg edema or JVD.  No shortness of breath.  CHF is compensated. -monitor volume status      # Hypertension -IV hydralazine as needed -Dilt-XR    # Acquired hypothyroidism, On Synthroid Leukocytosis: WBC 12.7, no fever.  So far no source of infection identified, likely reactive. -urinalysis negative    # Right rib fracture: Chest x-ray showed subacute right lateral 9th and 10th rib fractures -Incentive spirometry -Pain control # Acute metabolic encephalopathy, urinalysis negative   # Dementia, continue Fall precaution, Namenda, Exelon # Depression and anxiety, Continue home medications   # Protein-calorie malnutrition, severe: Body weight 40 kg, BMI 17.22 -Ensure -Nutrition consult  # Hypokalemia, potassium repleted. # Hypophosphatemia, Phos repleted. Monitor electrolytes and replete as needed.  # Vitamin D deficiency: started vitamin D 50,000 units p.o. weekly, follow with PCP to repeat vitamin D level after 3 to 6 months.   Body mass index is 20.23 kg/m.  Nutrition Problem: Severe Malnutrition Etiology: chronic illness (dementia, CHF) Interventions: Interventions: Ensure Enlive (each supplement provides 350kcal and 20 grams of protein), MVI      Diet: Regular diet DVT Prophylaxis: Subcutaneous Lovenox   Advance goals of care discussion: Full code  Family Communication: family was not present at bedside, at the time of interview.  Patient has significant dementia, AAO x 1, resting comfortably.   Disposition:  Pt is from Home, admitted with Fall and right Hip fracture, patient will need SNF placement, which precludes a safe discharge. Discharge to SNF, PT/OT eval done, can be discharged when bed will be available.   Subjective: No significant events overnight, patient was laying comfortably in the bed, denies any complaints.  AO x 1 at baseline due to significant dementia.  Unable  to offer any complaints   Physical Exam: General: NAD, lying comfortably Appear in no distress, affect appropriate Eyes: PERRLA ENT: Oral Mucosa Clear, moist  Neck: no JVD,  Cardiovascular: S1 and S2 Present, no Murmur,   Respiratory: good respiratory effort, Bilateral Air entry equal and Decreased, no Crackles, no wheezes Abdomen: Bowel Sound present, Soft and no tenderness,  Skin: no rashes Extremities: no Pedal edema, no calf tenderness, right hip dressing CDI Neurologic: without any new focal findings Gait not checked due to patient safety concerns  Vitals:   05/26/22 0738 05/26/22 1537 05/27/22 0757 05/27/22 0850  BP: (!) 167/74 (!) 144/67 (!) 181/80 125/87  Pulse: 91 70 66 63  Resp: 18 20 16    Temp: 97.8 F (36.6 C) 97.9 F (36.6 C) 97.8 F (36.6 C)   TempSrc:      SpO2: 99% 94% 100%   Weight:      Height:        Intake/Output Summary (Last 24 hours) at 05/27/2022 1153 Last data filed at 05/26/2022 1500 Gross per 24 hour  Intake 0.75 ml  Output --  Net 0.75 ml    Filed Weights   05/22/22 1648 05/23/22 1104  Weight: 43.9 kg 43.9 kg    Data Reviewed: I have personally reviewed and interpreted daily labs, tele strips, imagings as discussed above. I reviewed all nursing notes, pharmacy notes, vitals, pertinent old records I have discussed plan of care as described above with RN and patient/family.  CBC: Recent Labs  Lab 05/22/22 1306 05/23/22 0507 05/24/22 0619 05/25/22 0434 05/26/22 0423 05/27/22 0514  WBC 12.7* 11.4* 21.4* 13.7* 12.7* 13.1*  NEUTROABS 10.4*  --   --   --   --   --   HGB 12.2 10.5* 9.9* 8.4* 9.7* 10.1*  HCT 35.6* 29.8* 27.2* 23.8* 27.5* 28.9*  MCV 85.0 84.7 82.4 84.7 84.4 84.3  PLT 272 203 229 230 279 312   Basic Metabolic Panel: Recent Labs  Lab 05/23/22 0507 05/23/22 0912 05/24/22 0619 05/25/22 0434 05/26/22 0423 05/27/22 0514  NA 136  --  131* 134* 134* 136  K 3.2*  --  3.1* 4.4 3.6 3.3*  CL 104  --  98 103 100 100  CO2 22  --  24 25 27 26   GLUCOSE 106*  --  183* 108* 123* 122*  BUN 16  --  21 28* 24* 18  CREATININE 0.80  --  0.89 1.01* 0.83 0.60  CALCIUM 8.1*  --  8.3* 8.4* 8.4* 8.3*  MG  --  1.9 2.0 2.1 2.1 1.9  PHOS  --  3.9 2.8 2.7  1.8* 2.9    Studies: No results found.  Scheduled Meds:  amLODipine  10 mg Oral Daily   cyanocobalamin  1,000 mcg Oral Daily   docusate sodium  100 mg Oral BID   enoxaparin (LOVENOX) injection  30 mg Subcutaneous QPM   feeding supplement  237 mL Oral BID BM   levothyroxine  88 mcg Oral Daily   lidocaine  1 patch Transdermal Q24H   memantine  5 mg Oral BID   multivitamin with minerals  1 tablet Oral Daily   pantoprazole  40 mg Oral Daily   QUEtiapine  25 mg Oral QHS   rivastigmine  3 mg Oral BID   sertraline  25 mg Oral Daily   Vitamin D (Ergocalciferol)  50,000 Units Oral Q7 days   Continuous Infusions:   PRN Meds: acetaminophen, albuterol, ALPRAZolam, hydrALAZINE, hydrALAZINE, HYDROcodone-acetaminophen, menthol-cetylpyridinium **OR** phenol,  methocarbamol, metoCLOPramide **OR** metoCLOPramide (REGLAN) injection, morphine injection, ondansetron (ZOFRAN) IV, ondansetron **OR** ondansetron (ZOFRAN) IV, senna-docusate, traMADol  Time spent: 35 minutes  Author: Gillis Santa. MD [facility] 05/27/2022 11:53 AM  To reach On-call, see care teams to locate the attending and reach out to them via [email or web address]. If 7PM-7AM, please contact night-coverage If you still have difficulty reaching the attending provider, please page the [facility] (Director on Call) for [facility] on amion for assistance.

## 2022-05-27 NOTE — Progress Notes (Signed)
Physical Therapy Treatment Patient Details Name: Lisa Martin MRN: [id number] DOB: July 04, 1931 Today's Date: 05/27/2022   History of Present Illness Pt is an 89 y.o. female with medical history significant of dementia, HTN, dCHF, hypothyroidism, depression with anxiety, GERD, breast cancer (s/p of radiation and chemotherapy), who presents with fall with right hip pain and confusion. Pt diagnosed with right hip Intertrochanteric fracture and is s/p IM nail.  MD assessment also includes acute metabolic encephalopathy, HTN, R rib fracture, and severe protein-calorie malnutrition.    PT Comments    Pt pleasantly confused during the session but was able to follow most 1-step commands with extra time and cuing.  Pt continued to require physical assist with all functional tasks but grossly improved effort and ability to initiate effort most notably with bed mobility tasks.  Pt also presented with grossly improved static sitting balance at the EOB but did continue to require occasional min A to prevent posterior LOB.  Pt's steps were very narrow with 2-3 instances of stepping on her own feet during amb at the EOB and from bed to chair with pt remaining a very high fall risk.  Pt will benefit from continued PT services upon discharge to safely address deficits listed in patient problem list for decreased caregiver assistance and eventual return to PLOF.    Recommendations for follow up therapy are one component of a multi-disciplinary discharge planning process, led by the attending physician.  Recommendations may be updated based on patient status, additional functional criteria and insurance authorization.  Follow Up Recommendations  Can patient physically be transported by private vehicle: No    Assistance Recommended at Discharge Frequent or constant Supervision/Assistance  Patient can return home with the following A lot of help with walking and/or transfers;A lot of help with  bathing/dressing/bathroom;Assistance with cooking/housework;Assistance with feeding;Direct supervision/assist for medications management;Direct supervision/assist for financial management;Assist for transportation;Help with stairs or ramp for entrance   Equipment Recommendations  Other (comment) (TBD)    Recommendations for Other Services       Precautions / Restrictions Precautions Precautions: Fall Restrictions Weight Bearing Restrictions: Yes RLE Weight Bearing: Weight bearing as tolerated     Mobility  Bed Mobility Overal bed mobility: Needs Assistance Bed Mobility: Supine to Sit, Sit to Supine     Supine to sit: Mod assist     General bed mobility comments: Mod A for BLE and trunk control with tactile cues for use of bed rail    Transfers Overall transfer level: Needs assistance Equipment used: 1 person hand held assist Transfers: Sit to/from Stand Sit to Stand: Mod assist           General transfer comment: Mod A to come to standing and to prevent posterior LOB upon initial stand    Ambulation/Gait Ambulation/Gait assistance: Mod assist Gait Distance (Feet): 3 Feet Assistive device: 1 person hand held assist Gait Pattern/deviations: Decreased stance time - right, Narrow base of support, Shuffle Gait velocity: decreased     General Gait Details: Pt ambulated with short, shuffling steps with very narrow BOS occasionally stepping on her own feet, mod A to prevent LOB   Stairs             Wheelchair Mobility    Modified Rankin (Stroke Patients Only)       Balance Overall balance assessment: Needs assistance Sitting-balance support: Feet supported Sitting balance-Leahy Scale: Poor Sitting balance - Comments: Occasional min A to prevent posterior LOB Postural control: Posterior lean, Right lateral lean Standing  balance support: Single extremity supported, During functional activity Standing balance-Leahy Scale: Poor                               Cognition Arousal/Alertness: Awake/alert Behavior During Therapy: Flat affect Overall Cognitive Status: No family/caregiver present to determine baseline cognitive functioning                                          Exercises Total Joint Exercises Ankle Circles/Pumps: AAROM, AROM, Both, 10 reps Heel Slides: AAROM, Strengthening, Both, 10 reps, 5 reps Hip ABduction/ADduction: AAROM, Strengthening, Both, 10 reps, 5 reps Long Arc Quad: AAROM, Strengthening, Both, 10 reps Other Exercises Other Exercises: Static sitting at the EOB x 6 min with anterior weight shifting activities; occasional min A to prevent posterior LOB    General Comments        Pertinent Vitals/Pain Pain Assessment Pain Assessment: No/denies pain    Home Living                          Prior Function            PT Goals (current goals can now be found in the care plan section) Progress towards PT goals: Progressing toward goals    Frequency    BID      PT Plan Current plan remains appropriate    Co-evaluation              AM-PAC PT "6 Clicks" Mobility   Outcome Measure  Help needed turning from your back to your side while in a flat bed without using bedrails?: A Little Help needed moving from lying on your back to sitting on the side of a flat bed without using bedrails?: A Little Help needed moving to and from a bed to a chair (including a wheelchair)?: A Little Help needed standing up from a chair using your arms (e.g., wheelchair or bedside chair)?: A Little Help needed to walk in hospital room?: A Lot Help needed climbing 3-5 steps with a railing? : Total 6 Click Score: 15    End of Session Equipment Utilized During Treatment: Gait belt Activity Tolerance: Patient tolerated treatment well Patient left: in chair;with call bell/phone within reach;with chair alarm set;with SCD's reapplied;Other (comment) (safety mittens donned as pt  found) Nurse Communication: Mobility status PT Visit Diagnosis: Unsteadiness on feet (R26.81);History of falling (Z91.81);Other abnormalities of gait and mobility (R26.89);Muscle weakness (generalized) (M62.81)     Time: 1610-9604 PT Time Calculation (min) (ACUTE ONLY): 23 min  Charges:  $Therapeutic Exercise: 8-22 mins $Therapeutic Activity: 8-22 mins                    D. Scott Peggi Yono PT, DPT 05/27/22, 10:14 AM

## 2022-05-27 NOTE — Progress Notes (Signed)
OT Cancellation Note  Patient Details Name: Lisa Martin MRN: [id number] DOB: 07-20-1931   Cancelled Treatment:    Reason Eval/Treat Not Completed: Patient declined, no reason specified. Upon attempt, pt pleasantly confused. Asked about her mother. Active listening utilized. Pt endorsed having "a little" stomach pain but unable to describe further. Declined ADL/mobility. RN notified of stomach pain. Will re-attempt at later date.   Arman Filter., MPH, MS, OTR/L ascom [phone] 05/27/22, 3:27 PM

## 2022-05-27 NOTE — Progress Notes (Addendum)
Subjective: 4 Days Post-Op Procedure(s) (LRB): INTRAMEDULLARY (IM) NAIL INTERTROCHANTERIC (Right) Patient alert this morning.  She denies having any pain in her hip.  Patient is a difficult historian secondary to dementia. We will continue therapy today.  Plan is to go Skilled nursing facility after hospital stay.  Objective: Vital signs in last 24 hours: Temp:  [97.9 F (36.6 C)] 97.9 F (36.6 C) (04/25 1537) Pulse Rate:  [70] 70 (04/25 1537) Resp:  [20] 20 (04/25 1537) BP: (144)/(67) 144/67 (04/25 1537) SpO2:  [94 %] 94 % (04/25 1537)  Intake/Output from previous day: 04/25 0701 - 04/26 0700 In: 0.8 [IV Piggyback:0.8] Out: -  Intake/Output this shift: No intake/output data recorded.  Recent Labs    05/25/22 0434 05/26/22 0423 05/27/22 0514  HGB 8.4* 9.7* 10.1*   Recent Labs    05/26/22 0423 05/27/22 0514  WBC 12.7* 13.1*  RBC 3.26* 3.43*  HCT 27.5* 28.9*  PLT 279 312   Recent Labs    05/26/22 0423 05/27/22 0514  NA 134* 136  K 3.6 3.3*  CL 100 100  CO2 27 26  BUN 24* 18  CREATININE 0.83 0.60  GLUCOSE 123* 122*  CALCIUM 8.4* 8.3*   No results for input(s): "LABPT", "INR" in the last 72 hours.   EXAM General - Patient is Confused Extremity - Intact pulses distally No cellulitis present Compartment soft No significant swelling or bruising - homans sign Dressing - dressing C/D/I and no drainage   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   Obesity, unspecified    Personal history of chemotherapy    Personal history of malignant neoplasm of breast 2000   Personal history of radiation therapy    Personal history of tobacco  use, presenting hazards to health    Special screening for malignant neoplasms, colon    Thyroid disease 2012   hyperactive thyroid    Assessment/Plan:   4 Days Post-Op Procedure(s) (LRB): INTRAMEDULLARY (IM) NAIL INTERTROCHANTERIC (Right) Principal Problem:   Closed right hip fracture (HCC) Active Problems:   Acute metabolic encephalopathy   Hypertension   Acquired hypothyroidism   Protein-calorie malnutrition, severe (HCC)   Fall at home, initial encounter   Leukocytosis   Right rib fracture   Chronic diastolic CHF (congestive heart failure) (HCC)   Dementia (HCC)   Depression with anxiety   Intertrochanteric fracture of right hip, closed, initial encounter (HCC)  Estimated body mass index is 20.23 kg/m as calculated from the following:   Height as of this encounter: 4\' 10"  (1.473 m).   Weight as of this encounter: 43.9 kg. Advance diet Up with therapy Pain controlled Vital signs stable Hgb stable, 10.1, stable, trending up. CM to assist with discharge to SNF  Follow up with [facility] in 2 weeks postop WBAT RLE Lovenox 40 mg SQ daily x 14 days at discharge  DVT Prophylaxis - Lovenox, TED hose, and SCDs Weight-Bearing as tolerated to right leg   T. Cranston Neighbor, PA-C [facility] 05/27/2022, 7:57 AM  Patient seen and examined, agree with above plan.  The patient is doing well status post right hip intramedullary nail,  no concerns at this time.  Pain is controlled. H/H stable. Patient orthopedically stable for discharge at medical teams discretion.   Reinaldo Berber MD

## 2022-05-27 NOTE — Progress Notes (Signed)
Physical Therapy Treatment Patient Details Name: Lisa Martin MRN: [id number] DOB: 1931/07/30 Today's Date: 05/27/2022   History of Present Illness Pt is an 89 y.o. female with medical history significant of dementia, HTN, dCHF, hypothyroidism, depression with anxiety, GERD, breast cancer (s/p of radiation and chemotherapy), who presents with fall with right hip pain and confusion. Pt diagnosed with right hip Intertrochanteric fracture and is s/p IM nail.  MD assessment also includes acute metabolic encephalopathy, HTN, R rib fracture, and severe protein-calorie malnutrition.    PT Comments    Pt was pleasant confused during the session but able to follow most 1-step commands with extra time and cuing. Pt continued to require physical assist with all functional tasks and to prevent LOB in both sitting and standing.  Pt reported no adverse symptoms during the session other than min non-verbal signs of pain to the R hip with movement.  Pt will benefit from continued PT services upon discharge to safely address deficits listed in patient problem list for decreased caregiver assistance and eventual return to PLOF.    Recommendations for follow up therapy are one component of a multi-disciplinary discharge planning process, led by the attending physician.  Recommendations may be updated based on patient status, additional functional criteria and insurance authorization.  Follow Up Recommendations  Can patient physically be transported by private vehicle: No    Assistance Recommended at Discharge Frequent or constant Supervision/Assistance  Patient can return home with the following A lot of help with walking and/or transfers;A lot of help with bathing/dressing/bathroom;Assistance with cooking/housework;Assistance with feeding;Direct supervision/assist for medications management;Direct supervision/assist for financial management;Assist for transportation;Help with stairs or ramp for entrance    Equipment Recommendations  Other (comment) (TBD)    Recommendations for Other Services       Precautions / Restrictions Precautions Precautions: Fall Restrictions Weight Bearing Restrictions: Yes RLE Weight Bearing: Weight bearing as tolerated     Mobility  Bed Mobility Overal bed mobility: Needs Assistance Bed Mobility: Rolling, Sidelying to Sit Rolling: Mod assist Sidelying to sit: Mod assist Supine to sit: Mod assist     General bed mobility comments: Log roll training to the L with pillow between the knees for hip support, mod A for BLE and trunk control    Transfers Overall transfer level: Needs assistance Equipment used: 2 person hand held assist Transfers: Sit to/from Stand Sit to Stand: Mod assist           General transfer comment: Mod A to come to standing and to prevent posterior LOB upon initial stand    Ambulation/Gait Ambulation/Gait assistance: Mod assist Gait Distance (Feet): 4 Feet Assistive device: 2 person hand held assist Gait Pattern/deviations: Decreased stance time - right, Narrow base of support, Shuffle Gait velocity: decreased     General Gait Details: Pt ambulated with short, shuffling steps with very narrow BOS occasionally stepping on her own feet, mod A to prevent LOB   Stairs             Wheelchair Mobility    Modified Rankin (Stroke Patients Only)       Balance Overall balance assessment: Needs assistance Sitting-balance support: Feet supported Sitting balance-Leahy Scale: Poor Sitting balance - Comments: Occasional min A to prevent posterior LOB Postural control: Posterior lean, Right lateral lean Standing balance support: During functional activity, Bilateral upper extremity supported Standing balance-Leahy Scale: Poor Standing balance comment: external support required to maintain standing balance  Cognition Arousal/Alertness: Awake/alert Behavior During Therapy:  Flat affect Overall Cognitive Status: No family/caregiver present to determine baseline cognitive functioning                                          Exercises Total Joint Exercises Ankle Circles/Pumps: AAROM, AROM, Both, 10 reps Towel Squeeze: Strengthening, Both, 5 reps Heel Slides: AAROM, Strengthening, Both, 10 reps, 5 reps Hip ABduction/ADduction: AAROM, Strengthening, Both, 10 reps, 5 reps Straight Leg Raises: AAROM, Strengthening, Both, 10 reps, 5 reps Long Arc Quad: AAROM, Strengthening, Both, 10 reps Other Exercises Other Exercises: Anterior weight shifting activities in sitting at EOB    General Comments        Pertinent Vitals/Pain Pain Assessment Pain Assessment: PAINAD Breathing: normal Negative Vocalization: occasional moan/groan, low speech, negative/disapproving quality Facial Expression: smiling or inexpressive Body Language: relaxed Consolability: no need to console PAINAD Score: 1 Pain Location: R hip Pain Intervention(s): Repositioned, Premedicated before session, Monitored during session    Home Living                          Prior Function            PT Goals (current goals can now be found in the care plan section) Progress towards PT goals: Progressing toward goals    Frequency    BID      PT Plan Current plan remains appropriate    Co-evaluation              AM-PAC PT "6 Clicks" Mobility   Outcome Measure  Help needed turning from your back to your side while in a flat bed without using bedrails?: A Little Help needed moving from lying on your back to sitting on the side of a flat bed without using bedrails?: A Lot Help needed moving to and from a bed to a chair (including a wheelchair)?: A Lot Help needed standing up from a chair using your arms (e.g., wheelchair or bedside chair)?: A Lot Help needed to walk in hospital room?: A Lot Help needed climbing 3-5 steps with a railing? : Total 6 Click  Score: 12    End of Session Equipment Utilized During Treatment: Gait belt Activity Tolerance: Patient tolerated treatment well Patient left: in bed;with call bell/phone within reach;with bed alarm set;with SCD's reapplied Nurse Communication: Mobility status PT Visit Diagnosis: Unsteadiness on feet (R26.81);History of falling (Z91.81);Other abnormalities of gait and mobility (R26.89);Muscle weakness (generalized) (M62.81)     Time: 4098-1191 PT Time Calculation (min) (ACUTE ONLY): 24 min  Charges:  $Therapeutic Exercise: 8-22 mins $Therapeutic Activity: 8-22 mins                     D. Scott Lilybeth Vien PT, DPT 05/27/22, 2:42 PM

## 2022-05-28 DIAGNOSIS — S72001A Fracture of unspecified part of neck of right femur, initial encounter for closed fracture: Secondary | ICD-10-CM | POA: Diagnosis not present

## 2022-05-28 LAB — CBC
HCT: 26.5 % — ABNORMAL LOW (ref 36.0–46.0)
Hemoglobin: 9.3 g/dL — ABNORMAL LOW (ref 12.0–15.0)
MCH: 29.5 pg (ref 26.0–34.0)
MCHC: 35.1 g/dL (ref 30.0–36.0)
MCV: 84.1 fL (ref 80.0–100.0)
Platelets: 355 10*3/uL (ref 150–400)
RBC: 3.15 MIL/uL — ABNORMAL LOW (ref 3.87–5.11)
RDW: 14.3 % (ref 11.5–15.5)
WBC: 9.3 10*3/uL (ref 4.0–10.5)
nRBC: 0 % (ref 0.0–0.2)

## 2022-05-28 LAB — BASIC METABOLIC PANEL
Anion gap: 7 (ref 5–15)
BUN: 22 mg/dL (ref 8–23)
CO2: 26 mmol/L (ref 22–32)
Calcium: 8.7 mg/dL — ABNORMAL LOW (ref 8.9–10.3)
Chloride: 103 mmol/L (ref 98–111)
Creatinine, Ser: 0.75 mg/dL (ref 0.44–1.00)
GFR, Estimated: >60 mL/min (ref >60)
Glucose, Bld: 94 mg/dL (ref 70–99)
Potassium: 4 mmol/L (ref 3.5–5.1)
Sodium: 136 mmol/L (ref 135–145)

## 2022-05-28 LAB — PHOSPHORUS: Phosphorus: 3.9 mg/dL (ref 2.5–4.6)

## 2022-05-28 LAB — MAGNESIUM: Magnesium: 2.1 mg/dL (ref 1.7–2.4)

## 2022-05-28 NOTE — Progress Notes (Signed)
Physical Therapy Treatment Patient Details Name: Lisa Martin MRN: [id number] DOB: 03-31-1931 Today's Date: 05/28/2022   History of Present Illness Pt is an 89 y.o. female with medical history significant of dementia, HTN, dCHF, hypothyroidism, depression with anxiety, GERD, breast cancer (s/p of radiation and chemotherapy), who presents with fall with right hip pain and confusion. Pt diagnosed with right hip Intertrochanteric fracture and is s/p IM nail.  MD assessment also includes acute metabolic encephalopathy, HTN, R rib fracture, and severe protein-calorie malnutrition.    PT Comments    Pt found in bed to be incontinent of large BM, Nursing in promptly to assist with hygiene. Pt continues to require ModA for bed mobility and transfers. Fair tolerance for AAROM R LE, yet still endorses pain and currently only oriented to self. Pt however is very cooperative and willing to mobilize as tolerated. Pt left sitting comfortably in bedside recliner with all needs in reach.   Recommendations for follow up therapy are one component of a multi-disciplinary discharge planning process, led by the attending physician.  Recommendations may be updated based on patient status, additional functional criteria and insurance authorization.  Follow Up Recommendations  Can patient physically be transported by private vehicle: No    Assistance Recommended at Discharge Frequent or constant Supervision/Assistance  Patient can return home with the following A lot of help with walking and/or transfers;A lot of help with bathing/dressing/bathroom;Assistance with cooking/housework;Assistance with feeding;Direct supervision/assist for medications management;Direct supervision/assist for financial management;Assist for transportation;Help with stairs or ramp for entrance   Equipment Recommendations  Other (comment) (TBD at next facility)    Recommendations for Other Services       Precautions / Restrictions  Precautions Precautions: Fall Restrictions Weight Bearing Restrictions: Yes RLE Weight Bearing: Weight bearing as tolerated     Mobility  Bed Mobility Overal bed mobility: Needs Assistance Bed Mobility: Rolling, Sidelying to Sit Rolling: Mod assist Sidelying to sit: Mod assist Supine to sit: Mod assist Sit to supine: Mod assist   General bed mobility comments:  (R hip pain with bed mobility after a large BM requiring significant assist for hygiene)    Transfers Overall transfer level: Needs assistance Equipment used: None Transfers: Sit to/from Stand, Bed to chair/wheelchair/BSC Sit to Stand: Mod assist Stand pivot transfers: Mod assist         General transfer comment:  (Slow side step shuffle to bedside chair with assist to weight shift)    Ambulation/Gait               General Gait Details:  (Unable to tolerate this am)   Stairs             Wheelchair Mobility    Modified Rankin (Stroke Patients Only)       Balance Overall balance assessment: Needs assistance Sitting-balance support: Feet supported, Bilateral upper extremity supported Sitting balance-Leahy Scale: Fair     Standing balance support: During functional activity, Bilateral upper extremity supported Standing balance-Leahy Scale: Poor Standing balance comment: external support required to maintain standing balance                            Cognition Arousal/Alertness: Awake/alert Behavior During Therapy: Flat affect Overall Cognitive Status: No family/caregiver present to determine baseline cognitive functioning                                 General  Comments: Patient is cooperative. easily distracted and confused but able to follow single step commands with multi modal cues        Exercises Total Joint Exercises Ankle Circles/Pumps: AAROM, AROM, Both, 10 reps Long Arc Quad: AAROM, Strengthening, Both, 5 reps    General Comments General  comments (skin integrity, edema, etc.):  (Pt found incontinent of large BM in bed requiring assist for bed mobility while hygiene was being completed by nursing)      Pertinent Vitals/Pain Pain Assessment Pain Assessment: Faces Faces Pain Scale: Hurts little more Pain Location: R hip Pain Descriptors / Indicators: Discomfort, Grimacing Pain Intervention(s): Limited activity within patient's tolerance, Repositioned    Home Living                          Prior Function            PT Goals (current goals can now be found in the care plan section) Acute Rehab PT Goals Patient Stated Goal: none stated    Frequency    BID      PT Plan Current plan remains appropriate    Co-evaluation              AM-PAC PT "6 Clicks" Mobility   Outcome Measure  Help needed turning from your back to your side while in a flat bed without using bedrails?: A Little Help needed moving from lying on your back to sitting on the side of a flat bed without using bedrails?: A Lot Help needed moving to and from a bed to a chair (including a wheelchair)?: A Lot Help needed standing up from a chair using your arms (e.g., wheelchair or bedside chair)?: A Lot Help needed to walk in hospital room?: A Lot Help needed climbing 3-5 steps with a railing? : Total 6 Click Score: 12    End of Session   Activity Tolerance: Patient limited by pain Patient left: in chair;with call bell/phone within reach;with chair alarm set Nurse Communication: Mobility status PT Visit Diagnosis: Unsteadiness on feet (R26.81);History of falling (Z91.81);Other abnormalities of gait and mobility (R26.89);Muscle weakness (generalized) (M62.81)     Time: 1610-9604 PT Time Calculation (min) (ACUTE ONLY): 23 min  Charges:  $Therapeutic Exercise: 8-22 mins $Therapeutic Activity: 8-22 mins                    Zadie Cleverly, PTA  Jannet Askew 05/28/2022, 11:46 AM

## 2022-05-28 NOTE — Progress Notes (Signed)
[facility] Progress Note  Patient: Lisa Martin    WUJ:[id number]  DOA: 05/22/2022     Date of Service: the patient was seen and examined on 05/28/2022  Chief Complaint  Patient presents with   Fall   Brief hospital course: ERMIE GLENDENNING is a 89 y.o. female with medical history significant of dementia, HTN, dCHF, hypothyroidism, depression with anxiety, GERD, breast cancer (s/p of radiation and chemotherapy), who presents with fall, right hip pain and confusion. Per her daughter at bedside, pt fell accidentally when she was walking around her wheelchair at about 9:00 PM yesterday. No LOC. X-ray of right hip/pelvis: Acute comminuted intertrochanteric fracture of the proximal right femur with varus angulation. X-ray of right femur is negatve for femoral shaft fracture.  Patient is admitted to telemetry bed as inpatient.    Assessment and Plan:  # Closed right hip fracture: X-ray of right hip/pelvis showed acute comminuted intertrochanteric fracture of the proximal right femur with varus angulation. Orthopedic surgeon, Dr. Audelia Acton is consulted,  Patient's GUPTA score perioperative myocardial infarction or cardaic arrest is 2.63 %.  S/p  Right Hip Intramedullary nail done on 4/22 Follow up with [facility] in 2 weeks, WBAT RLE, Lovenox 40 mg SQ daily x 14 days at discharge PT and OT eval for SNF placement TOC consulted for placement  # Anemia most likely due to acute blood loss postop Hb 10.5--8.4--910.1 Monitor H&H and transfuse if hemoglobin less than 7 Resumed Lovenox    # Fall at home, initial encounter: -PT/OT eval for placement, Fall precaution -CK level 113 wnl    # Chronic diastolic CHF (congestive heart failure): 2D echo on 12/11/2017 showed EF> 55% with grade 1 diastolic dysfunction.  Patient has mildly elevated BNP 204, but no leg edema or JVD.  No shortness of breath.  CHF is compensated. -monitor volume status      # Hypertension S/p Dilt-XR, which has been  discontinued due to bradycardia 4/26 started amlodipine 10 mg p.o. daily -IV hydralazine as needed Monitor BP and titrate medications accordingly   # Acquired hypothyroidism, On Synthroid Leukocytosis: WBC 12.7, no fever.  So far no source of infection identified, likely reactive. -urinalysis negative.  On 4/27 WBC 9.3 WNL, leukocytosis resolved.   # Right rib fracture: Chest x-ray showed subacute right lateral 9th and 10th rib fractures -Incentive spirometry -Pain control # Acute metabolic encephalopathy, urinalysis negative   # Dementia, continue Fall precaution, Namenda, Exelon # Depression and anxiety, Continue home medications   # Protein-calorie malnutrition, severe: Body weight 40 kg, BMI 17.22 -Ensure -Nutrition consult  # Hypokalemia, potassium repleted. # Hypophosphatemia, Phos repleted. Monitor electrolytes and replete as needed.  # Vitamin D deficiency: started vitamin D 50,000 units p.o. weekly, follow with PCP to repeat vitamin D level after 3 to 6 months.   Body mass index is 20.23 kg/m.  Nutrition Problem: Severe Malnutrition Etiology: chronic illness (dementia, CHF) Interventions: Interventions: Ensure Enlive (each supplement provides 350kcal and 20 grams of protein), MVI      Diet: Regular diet DVT Prophylaxis: Subcutaneous Lovenox   Advance goals of care discussion: Full code  Family Communication: family was not present at bedside, at the time of interview.  Patient has significant dementia, AAO x 1, resting comfortably.   Disposition:  Pt is from Home, admitted with Fall and right Hip fracture, patient will need SNF placement, which precludes a safe discharge. Discharge to SNF, PT/OT eval done, can be discharged when bed will be available.  Subjective: No significant events overnight, patient was laying comfortably in the bed, denies any complaints.  AO x 1 at baseline due to significant dementia.  Unable to offer any complaints   Physical  Exam: General: NAD, lying comfortably Appear in no distress, affect appropriate Eyes: PERRLA ENT: Oral Mucosa Clear, moist  Neck: no JVD,  Cardiovascular: S1 and S2 Present, no Murmur,  Respiratory: good respiratory effort, Bilateral Air entry equal and Decreased, no Crackles, no wheezes Abdomen: Bowel Sound present, Soft and no tenderness,  Skin: no rashes Extremities: no Pedal edema, no calf tenderness, right hip dressing CDI Neurologic: without any new focal findings Gait not checked due to patient safety concerns  Vitals:   05/27/22 1554 05/27/22 1740 05/28/22 0100 05/28/22 0846  BP: (!) 166/88 (!) 153/73 (!) 151/67 (!) 153/87  Pulse: 93 78 72 74  Resp: 16  16 19   Temp:   98.3 F (36.8 C) 97.9 F (36.6 C)  TempSrc:   Oral   SpO2: 100%  98% 97%  Weight:      Height:        Intake/Output Summary (Last 24 hours) at 05/28/2022 1415 Last data filed at 05/28/2022 1007 Gross per 24 hour  Intake 1120 ml  Output --  Net 1120 ml    Filed Weights   05/22/22 1648 05/23/22 1104  Weight: 43.9 kg 43.9 kg    Data Reviewed: I have personally reviewed and interpreted daily labs, tele strips, imagings as discussed above. I reviewed all nursing notes, pharmacy notes, vitals, pertinent old records I have discussed plan of care as described above with RN and patient/family.  CBC: Recent Labs  Lab 05/22/22 1306 05/23/22 0507 05/24/22 0619 05/25/22 0434 05/26/22 0423 05/27/22 0514 05/28/22 0538  WBC 12.7*   < > 21.4* 13.7* 12.7* 13.1* 9.3  NEUTROABS 10.4*  --   --   --   --   --   --   HGB 12.2   < > 9.9* 8.4* 9.7* 10.1* 9.3*  HCT 35.6*   < > 27.2* 23.8* 27.5* 28.9* 26.5*  MCV 85.0   < > 82.4 84.7 84.4 84.3 84.1  PLT 272   < > 229 230 279 312 355   < > = values in this interval not displayed.   Basic Metabolic Panel: Recent Labs  Lab 05/24/22 0619 05/25/22 0434 05/26/22 0423 05/27/22 0514 05/28/22 0538  NA 131* 134* 134* 136 136  K 3.1* 4.4 3.6 3.3* 4.0  CL 98 103  100 100 103  CO2 24 25 27 26 26   GLUCOSE 183* 108* 123* 122* 94  BUN 21 28* 24* 18 22  CREATININE 0.89 1.01* 0.83 0.60 0.75  CALCIUM 8.3* 8.4* 8.4* 8.3* 8.7*  MG 2.0 2.1 2.1 1.9 2.1  PHOS 2.8 2.7 1.8* 2.9 3.9    Studies: No results found.  Scheduled Meds:  amLODipine  10 mg Oral Daily   cyanocobalamin  1,000 mcg Oral Daily   docusate sodium  100 mg Oral BID   enoxaparin (LOVENOX) injection  30 mg Subcutaneous QPM   feeding supplement  237 mL Oral BID BM   levothyroxine  88 mcg Oral Daily   lidocaine  1 patch Transdermal Q24H   memantine  5 mg Oral BID   multivitamin with minerals  1 tablet Oral Daily   pantoprazole  40 mg Oral Daily   QUEtiapine  25 mg Oral QHS   rivastigmine  3 mg Oral BID   sertraline  25 mg Oral  Daily   Vitamin D (Ergocalciferol)  50,000 Units Oral Q7 days   Continuous Infusions:   PRN Meds: acetaminophen, albuterol, ALPRAZolam, hydrALAZINE, hydrALAZINE, HYDROcodone-acetaminophen, menthol-cetylpyridinium **OR** phenol, methocarbamol, metoCLOPramide **OR** metoCLOPramide (REGLAN) injection, morphine injection, ondansetron (ZOFRAN) IV, ondansetron **OR** ondansetron (ZOFRAN) IV, senna-docusate, traMADol  Time spent: 35 minutes  Author: Gillis Santa. MD [facility] 05/28/2022 2:15 PM  To reach On-call, see care teams to locate the attending and reach out to them via [email or web address]. If 7PM-7AM, please contact night-coverage If you still have difficulty reaching the attending provider, please page the [facility] (Director on Call) for [facility] on amion for assistance.

## 2022-05-28 NOTE — Progress Notes (Signed)
Subjective: 5 Days Post-Op Procedure(s) (LRB): INTRAMEDULLARY (IM) NAIL INTERTROCHANTERIC (Right) Patient alert this morning.  She denies having any pain in her hip.  States that she is able to move her feet and knees with minimal pain.  States that she can feel and wiggle her toes.   Denies any chest pain, N/V, shortness of breath or fever. Patient is a difficult historian secondary to dementia. We will continue therapy today.  Plan is to go Skilled nursing facility after hospital stay.  Objective: Vital signs in last 24 hours: Temp:  [97.9 F (36.6 C)-98.3 F (36.8 C)] 97.9 F (36.6 C) (04/27 0846) Pulse Rate:  [72-93] 74 (04/27 0846) Resp:  [16-19] 19 (04/27 0846) BP: (151-166)/(67-88) 153/87 (04/27 0846) SpO2:  [97 %-100 %] 97 % (04/27 0846)  Vitals were reviewed, stable  Intake/Output from previous day: 04/26 0701 - 04/27 0700 In: 760 [P.O.:760] Out: -  Intake/Output this shift: Total I/O In: 360 [P.O.:360] Out: -   Patient was found to be incontinent, had a large bowel movement in bed when physical therapy came to work with her this morning  Recent Labs    05/26/22 0423 05/27/22 0514 05/28/22 0538  HGB 9.7* 10.1* 9.3*   Recent Labs    05/27/22 0514 05/28/22 0538  WBC 13.1* 9.3  RBC 3.43* 3.15*  HCT 28.9* 26.5*  PLT 312 355   Recent Labs    05/27/22 0514 05/28/22 0538  NA 136 136  K 3.3* 4.0  CL 100 103  CO2 26 26  BUN 18 22  CREATININE 0.60 0.75  GLUCOSE 122* 94  CALCIUM 8.3* 8.7*   No results for input(s): "LABPT", "INR" in the last 72 hours.  Labs were reviewed -calcium is increasing from the prior day.  Her white blood cell count has come down significantly, her H&H continues to drop, continue to monitor with BMP and CBC ordered for tomorrow morning.  EXAM General - Patient is sleeping in her chair, when awoken appears pleasant however is Confused.  Consistent with her baseline Extremity - Intact pulses distally No cellulitis  present Compartment soft Patient is able to plantar and dorsiflex without issue Patient states that she is neurologically intact with palpation along her right thigh, lower leg and foot No significant swelling or bruising - homans sign negative Dressing - dressing C/D/I and no drainage   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   Obesity, unspecified    Personal history of chemotherapy    Personal history of malignant neoplasm of breast 2000   Personal history of radiation therapy    Personal history of tobacco use, presenting hazards to health    Special screening for malignant neoplasms, colon    Thyroid disease 2012   hyperactive thyroid    Assessment/Plan:   5 Days Post-Op Procedure(s) (LRB): INTRAMEDULLARY (IM) NAIL INTERTROCHANTERIC (Right) Principal Problem:   Closed right hip fracture (HCC) Active Problems:   Acute metabolic encephalopathy   Hypertension   Acquired hypothyroidism   Protein-calorie malnutrition, severe (HCC)   Fall at home, initial encounter   Leukocytosis   Right rib fracture   Chronic diastolic CHF (  congestive heart failure) (HCC)   Dementia (HCC)   Depression with anxiety   Intertrochanteric fracture of right hip, closed, initial encounter (HCC)  Estimated body mass index is 20.23 kg/m as calculated from the following:   Height as of this encounter: 4\' 10"  (1.473 m).   Weight as of this encounter: 43.9 kg. Advance diet Up with therapy Pain controlled Vital signs stable White count has improved.  Hgb has decreased to 9.3 from 10.1 on 05/27/2022, continue to monitor with routine labs in the morning. CM to assist with  discharge to SNF  Follow up with [facility] in 2 weeks postop WBAT RLE Lovenox 40 mg SQ daily x 14 days at discharge  DVT Prophylaxis - Lovenox, TED hose, and SCDs Weight-Bearing as tolerated to right leg  Patient orthopedically stable for discharge at medical teams discretion. Waiting for placement  Danise Edge, PA-C [facility] 05/28/2022, 12:53 PM

## 2022-05-28 NOTE — Progress Notes (Signed)
Physical Therapy Treatment Patient Details Name: Lisa Martin MRN: [id number] DOB: 1931-06-15 Today's Date: 05/28/2022   History of Present Illness Pt is an 89 y.o. female with medical history significant of dementia, HTN, dCHF, hypothyroidism, depression with anxiety, GERD, breast cancer (s/p of radiation and chemotherapy), who presents with fall with right hip pain and confusion. Pt diagnosed with right hip Intertrochanteric fracture and is s/p IM nail.  MD assessment also includes acute metabolic encephalopathy, HTN, R rib fracture, and severe protein-calorie malnutrition.    PT Comments    Pt found slightly sliding out of chair this pm after having another incontinent BM. Session focused on frequent sit<>stand transfers with ModA and prolonged static standing at RW with CGA while assisted with hygiene. Pt able to progress gait training taking steps forward/back and side stepping with RW and MinA. Increased tolerance for weight acceptance through R LE this pm. Pt assisted back to bed due to fatigue level. All needs met, bed alarm on, pt remains pleasantly confused. Will continue to progress per POC.    Recommendations for follow up therapy are one component of a multi-disciplinary discharge planning process, led by the attending physician.  Recommendations may be updated based on patient status, additional functional criteria and insurance authorization.  Follow Up Recommendations  Can patient physically be transported by private vehicle: No    Assistance Recommended at Discharge Frequent or constant Supervision/Assistance  Patient can return home with the following A lot of help with walking and/or transfers;A lot of help with bathing/dressing/bathroom;Assistance with cooking/housework;Assistance with feeding;Direct supervision/assist for medications management;Direct supervision/assist for financial management;Assist for transportation;Help with stairs or ramp for entrance   Equipment  Recommendations  Other (comment) (TBD)    Recommendations for Other Services       Precautions / Restrictions Precautions Precautions: Fall Restrictions Weight Bearing Restrictions: Yes RLE Weight Bearing: Weight bearing as tolerated     Mobility  Bed Mobility Overal bed mobility: Needs Assistance Bed Mobility: Sit to Supine Rolling: Mod assist Sidelying to sit: Mod assist Supine to sit: Mod assist Sit to supine: Mod assist   General bed mobility comments:  (R hip pain with bed mobility after a large BM requiring significant assist for hygiene)    Transfers Overall transfer level: Needs assistance Equipment used: Rolling walker (2 wheels) Transfers: Sit to/from Stand Sit to Stand: Mod assist Stand pivot transfers: Mod assist         General transfer comment:  (Pt able to maintain static stance for 1-2 minutes at RW with CGA during hygiene assist)    Ambulation/Gait Ambulation/Gait assistance: Min assist, Mod assist Gait Distance (Feet): 3 Feet Assistive device: Rolling walker (2 wheels) Gait Pattern/deviations: Decreased stance time - right, Narrow base of support, Shuffle       General Gait Details:  (Pt able to take small steps forward/back, and side step with MinA)   Stairs             Wheelchair Mobility    Modified Rankin (Stroke Patients Only)       Balance Overall balance assessment: Needs assistance Sitting-balance support: Feet supported, Bilateral upper extremity supported Sitting balance-Leahy Scale: Fair Sitting balance - Comments: Occasional min A to prevent posterior LOB   Standing balance support: During functional activity, Bilateral upper extremity supported Standing balance-Leahy Scale: Poor Standing balance comment: external support required to maintain standing balance  Cognition Arousal/Alertness: Awake/alert Behavior During Therapy: WFL for tasks assessed/performed Overall  Cognitive Status: No family/caregiver present to determine baseline cognitive functioning                                 General Comments:  (Pt is cooperative, Oriented to self only)        Exercises Total Joint Exercises Ankle Circles/Pumps: AAROM, AROM, Both, 10 reps Heel Slides: AAROM, Right, 5 reps, Supine Long Arc Quad: AAROM, Strengthening, Both, 5 reps    General Comments General comments (skin integrity, edema, etc.):  (Pt incontinent of stool in am and pm session)      Pertinent Vitals/Pain Pain Assessment Pain Assessment: Faces Faces Pain Scale: Hurts a little bit Pain Location: R hip Pain Descriptors / Indicators: Discomfort, Grimacing Pain Intervention(s): Limited activity within patient's tolerance, Repositioned    Home Living                          Prior Function            PT Goals (current goals can now be found in the care plan section) Acute Rehab PT Goals Patient Stated Goal: none stated    Frequency    BID      PT Plan Current plan remains appropriate    Co-evaluation              AM-PAC PT "6 Clicks" Mobility   Outcome Measure  Help needed turning from your back to your side while in a flat bed without using bedrails?: A Little Help needed moving from lying on your back to sitting on the side of a flat bed without using bedrails?: A Lot Help needed moving to and from a bed to a chair (including a wheelchair)?: A Lot Help needed standing up from a chair using your arms (e.g., wheelchair or bedside chair)?: A Lot Help needed to walk in hospital room?: A Lot Help needed climbing 3-5 steps with a railing? : Total 6 Click Score: 12    End of Session Equipment Utilized During Treatment: Gait belt Activity Tolerance: Patient tolerated treatment well Patient left: in bed;with call bell/phone within reach;with bed alarm set Nurse Communication: Mobility status PT Visit Diagnosis: Unsteadiness on feet  (R26.81);History of falling (Z91.81);Other abnormalities of gait and mobility (R26.89);Muscle weakness (generalized) (M62.81)     Time: 4332-9518 PT Time Calculation (min) (ACUTE ONLY): 23 min  Charges:  $Therapeutic Exercise: 8-22 mins $Therapeutic Activity: 8-22 mins                    Zadie Cleverly, PTA  Jannet Askew 05/28/2022, 4:26 PM

## 2022-05-28 NOTE — Plan of Care (Signed)
  Problem: Nutrition: Goal: Adequate nutrition will be maintained Outcome: Progressing   Problem: Activity: Goal: Risk for activity intolerance will decrease Outcome: Progressing   Problem: Elimination: Goal: Will not experience complications related to bowel motility Outcome: Progressing   Problem: Pain Management: Goal: Pain level will decrease Outcome: Progressing   Problem: Skin Integrity: Goal: Will show signs of wound healing Outcome: Progressing

## 2022-05-29 DIAGNOSIS — S72001A Fracture of unspecified part of neck of right femur, initial encounter for closed fracture: Secondary | ICD-10-CM | POA: Diagnosis not present

## 2022-05-29 NOTE — Progress Notes (Signed)
Physical Therapy Treatment Patient Details Name: Lisa Martin MRN: [id number] DOB: [birth year] Today's Date: 05/29/2022   History of Present Illness Pt is an 89 y.o. female with medical history significant of dementia, HTN, dCHF, hypothyroidism, depression with anxiety, GERD, breast cancer (s/p of radiation and chemotherapy), who presents with fall with right hip pain and confusion. Pt diagnosed with right hip Intertrochanteric fracture and is s/p IM nail.  MD assessment also includes acute metabolic encephalopathy, HTN, R rib fracture, and severe protein-calorie malnutrition.    PT Comments    Pt ready for session.  Inc of urine in bed.  To EOB with min a x 1 and transferred to [facility] with min a x 1 but no further void or BM.  She is able to progress gait 10' x 2 with seated rest in chair just outside of her door.  Remained in chair after session with safety precautions in place and needs met.     Recommendations for follow up therapy are one component of a multi-disciplinary discharge planning process, led by the attending physician.  Recommendations may be updated based on patient status, additional functional criteria and insurance authorization.  Follow Up Recommendations       Assistance Recommended at Discharge Frequent or constant Supervision/Assistance  Patient can return home with the following A little help with walking and/or transfers;A little help with bathing/dressing/bathroom;Assistance with cooking/housework;Assistance with feeding;Assist for transportation;Direct supervision/assist for medications management;Help with stairs or ramp for entrance   Equipment Recommendations       Recommendations for Other Services       Precautions / Restrictions Precautions Precautions: Fall Restrictions Weight Bearing Restrictions: Yes RLE Weight Bearing: Weight bearing as tolerated     Mobility  Bed Mobility Overal bed mobility: Needs Assistance Bed Mobility: Supine to Sit      Supine to sit: Min assist       Patient Response: Cooperative  Transfers Overall transfer level: Needs assistance Equipment used: Rolling walker (2 wheels) Transfers: Sit to/from Stand Sit to Stand: Min assist Stand pivot transfers: Min assist              Ambulation/Gait Ambulation/Gait assistance: Editor, commissioning (Feet): 10 Feet Assistive device: Rolling walker (2 wheels) Gait Pattern/deviations: Decreased stance time - right, Narrow base of support, Shuffle Gait velocity: decreased     General Gait Details: 10'x 2 with seated rest in chair outside of door   Stairs             Wheelchair Mobility    Modified Rankin (Stroke Patients Only)       Balance Overall balance assessment: Needs assistance Sitting-balance support: Feet supported, Bilateral upper extremity supported Sitting balance-Leahy Scale: Fair     Standing balance support: During functional activity, Bilateral upper extremity supported Standing balance-Leahy Scale: Poor Standing balance comment: external support required to maintain standing balance                            Cognition Arousal/Alertness: Awake/alert Behavior During Therapy: WFL for tasks assessed/performed Overall Cognitive Status: Within Functional Limits for tasks assessed                                          Exercises Other Exercises Other Exercises: to [facility] to void but she had already gone in bed prior to getting up  General Comments        Pertinent Vitals/Pain Pain Assessment Pain Assessment: Faces Faces Pain Scale: Hurts a little bit Pain Location: R hip Pain Descriptors / Indicators: Discomfort, Grimacing Pain Intervention(s): Limited activity within patient's tolerance, Repositioned    Home Living                          Prior Function            PT Goals (current goals can now be found in the care plan section) Progress towards PT goals:  Progressing toward goals    Frequency    BID      PT Plan Current plan remains appropriate    Co-evaluation              AM-PAC PT "6 Clicks" Mobility   Outcome Measure  Help needed turning from your back to your side while in a flat bed without using bedrails?: A Little Help needed moving from lying on your back to sitting on the side of a flat bed without using bedrails?: A Little Help needed moving to and from a bed to a chair (including a wheelchair)?: A Little Help needed standing up from a chair using your arms (e.g., wheelchair or bedside chair)?: A Little Help needed to walk in hospital room?: A Little Help needed climbing 3-5 steps with a railing? : A Lot 6 Click Score: 17    End of Session Equipment Utilized During Treatment: Gait belt Activity Tolerance: Patient tolerated treatment well Patient left: in chair;with call bell/phone within reach;with chair alarm set Nurse Communication: Mobility status PT Visit Diagnosis: Unsteadiness on feet (R26.81);History of falling (Z91.81);Other abnormalities of gait and mobility (R26.89);Muscle weakness (generalized) (M62.81)     Time: 7829-5621 PT Time Calculation (min) (ACUTE ONLY): 20 min  Charges:  $Gait Training: 8-22 mins                    Danielle Dess, PTA 05/29/22, 10:00 AM

## 2022-05-29 NOTE — Progress Notes (Signed)
[facility] Progress Note  Patient: Lisa Martin    WUJ:[id number]  DOA: 05/22/2022     Date of Service: the patient was seen and examined on 05/29/2022  Chief Complaint  Patient presents with   Fall   Brief hospital course: TAIJA MATHIAS is a 89 y.o. female with medical history significant of dementia, HTN, dCHF, hypothyroidism, depression with anxiety, GERD, breast cancer (s/p of radiation and chemotherapy), who presents with fall, right hip pain and confusion. Per her daughter at bedside, pt fell accidentally when she was walking around her wheelchair at about 9:00 PM yesterday. No LOC. X-ray of right hip/pelvis: Acute comminuted intertrochanteric fracture of the proximal right femur with varus angulation. X-ray of right femur is negatve for femoral shaft fracture.  Patient is admitted to telemetry bed as inpatient.    Assessment and Plan:  # Closed right hip fracture: X-ray of right hip/pelvis showed acute comminuted intertrochanteric fracture of the proximal right femur with varus angulation. Orthopedic surgeon, Dr. Audelia Acton is consulted,  Patient's GUPTA score perioperative myocardial infarction or cardaic arrest is 2.63 %.  S/p  Right Hip Intramedullary nail done on 4/22 Follow up with [facility] in 2 weeks, WBAT RLE, Lovenox 40 mg SQ daily x 14 days at discharge PT and OT eval for SNF placement TOC consulted for placement  # Anemia most likely due to acute blood loss postop Hb 10.5--8.4--9.3 Monitor H&H and transfuse if hemoglobin less than 7 Resumed Lovenox    # Fall at home, initial encounter: -PT/OT eval for placement, Fall precaution -CK level 113 wnl    # Chronic diastolic CHF (congestive heart failure): 2D echo on 12/11/2017 showed EF> 55% with grade 1 diastolic dysfunction.  Patient has mildly elevated BNP 204, but no leg edema or JVD.  No shortness of breath.  CHF is compensated. -monitor volume status      # Hypertension S/p Dilt-XR, which has been discontinued  due to bradycardia 4/26 started amlodipine 10 mg p.o. daily -IV hydralazine as needed Monitor BP and titrate medications accordingly   # Acquired hypothyroidism, On Synthroid Leukocytosis: WBC 12.7, no fever.  So far no source of infection identified, likely reactive. -urinalysis negative.  On 4/27 WBC 9.3 WNL, leukocytosis resolved.   # Right rib fracture: Chest x-ray showed subacute right lateral 9th and 10th rib fractures -Incentive spirometry -Pain control # Acute metabolic encephalopathy, urinalysis negative   # Dementia, continue Fall precaution, Namenda, Exelon # Depression and anxiety, Continue home medications   # Protein-calorie malnutrition, severe: Body weight 40 kg, BMI 17.22 -Ensure -Nutrition consult  # Hypokalemia, potassium repleted. # Hypophosphatemia, Phos repleted. Monitor electrolytes and replete as needed.  # Vitamin D deficiency: started vitamin D 50,000 units p.o. weekly, follow with PCP to repeat vitamin D level after 3 to 6 months.   Body mass index is 20.23 kg/m.  Nutrition Problem: Severe Malnutrition Etiology: chronic illness (dementia, CHF) Interventions: Interventions: Ensure Enlive (each supplement provides 350kcal and 20 grams of protein), MVI      Diet: Regular diet DVT Prophylaxis: Subcutaneous Lovenox   Advance goals of care discussion: Full code  Family Communication: family was not present at bedside, at the time of interview.  Patient has significant dementia, AAO x 1, resting comfortably.   Disposition:  Pt is from Home, admitted with Fall and right Hip fracture, patient will need SNF placement, which precludes a safe discharge. Discharge to SNF, PT/OT eval done, can be discharged when bed will be available.  Subjective: No significant events overnight, patient was sitting doubly on the recliner, eating breakfast.  AAO x 1 at baseline, seems little confused.  Patient did not offer any complaints.   Physical Exam: General:  NAD, lying comfortably Appear in no distress, affect appropriate Eyes: PERRLA ENT: Oral Mucosa Clear, moist  Neck: no JVD,  Cardiovascular: S1 and S2 Present, no Murmur,  Respiratory: good respiratory effort, Bilateral Air entry equal and Decreased, no Crackles, no wheezes Abdomen: Bowel Sound present, Soft and no tenderness,  Skin: no rashes Extremities: no Pedal edema, no calf tenderness, right hip dressing CDI Neurologic: without any new focal findings Gait not checked due to patient safety concerns  Vitals:   05/28/22 0846 05/28/22 1627 05/28/22 2343 05/29/22 0812  BP: (!) 153/87 121/71 (!) 110/54 (!) 154/79  Pulse: 74 84 84 72  Resp: 19 19 18 18   Temp: 97.9 F (36.6 C) 98.1 F (36.7 C) 97.6 F (36.4 C) 98.8 F (37.1 C)  TempSrc:      SpO2: 97% 96% 99% 94%  Weight:      Height:        Intake/Output Summary (Last 24 hours) at 05/29/2022 1150 Last data filed at 05/29/2022 1138 Gross per 24 hour  Intake 360 ml  Output --  Net 360 ml    Filed Weights   05/22/22 1648 05/23/22 1104  Weight: 43.9 kg 43.9 kg    Data Reviewed: I have personally reviewed and interpreted daily labs, tele strips, imagings as discussed above. I reviewed all nursing notes, pharmacy notes, vitals, pertinent old records I have discussed plan of care as described above with RN and patient/family.  CBC: Recent Labs  Lab 05/22/22 1306 05/23/22 0507 05/24/22 0619 05/25/22 0434 05/26/22 0423 05/27/22 0514 05/28/22 0538  WBC 12.7*   < > 21.4* 13.7* 12.7* 13.1* 9.3  NEUTROABS 10.4*  --   --   --   --   --   --   HGB 12.2   < > 9.9* 8.4* 9.7* 10.1* 9.3*  HCT 35.6*   < > 27.2* 23.8* 27.5* 28.9* 26.5*  MCV 85.0   < > 82.4 84.7 84.4 84.3 84.1  PLT 272   < > 229 230 279 312 355   < > = values in this interval not displayed.   Basic Metabolic Panel: Recent Labs  Lab 05/24/22 0619 05/25/22 0434 05/26/22 0423 05/27/22 0514 05/28/22 0538  NA 131* 134* 134* 136 136  K 3.1* 4.4 3.6 3.3* 4.0   CL 98 103 100 100 103  CO2 24 25 27 26 26   GLUCOSE 183* 108* 123* 122* 94  BUN 21 28* 24* 18 22  CREATININE 0.89 1.01* 0.83 0.60 0.75  CALCIUM 8.3* 8.4* 8.4* 8.3* 8.7*  MG 2.0 2.1 2.1 1.9 2.1  PHOS 2.8 2.7 1.8* 2.9 3.9    Studies: No results found.  Scheduled Meds:  amLODipine  10 mg Oral Daily   cyanocobalamin  1,000 mcg Oral Daily   docusate sodium  100 mg Oral BID   enoxaparin (LOVENOX) injection  30 mg Subcutaneous QPM   feeding supplement  237 mL Oral BID BM   levothyroxine  88 mcg Oral Daily   lidocaine  1 patch Transdermal Q24H   memantine  5 mg Oral BID   multivitamin with minerals  1 tablet Oral Daily   pantoprazole  40 mg Oral Daily   QUEtiapine  25 mg Oral QHS   rivastigmine  3 mg Oral BID   sertraline  25 mg Oral Daily   Vitamin D (Ergocalciferol)  50,000 Units Oral Q7 days   Continuous Infusions:   PRN Meds: acetaminophen, albuterol, ALPRAZolam, hydrALAZINE, hydrALAZINE, HYDROcodone-acetaminophen, menthol-cetylpyridinium **OR** phenol, methocarbamol, metoCLOPramide **OR** metoCLOPramide (REGLAN) injection, morphine injection, ondansetron (ZOFRAN) IV, ondansetron **OR** ondansetron (ZOFRAN) IV, senna-docusate, traMADol  Time spent: 35 minutes  Author: Gillis Santa. MD [facility] 05/29/2022 11:50 AM  To reach On-call, see care teams to locate the attending and reach out to them via [email or web address]. If 7PM-7AM, please contact night-coverage If you still have difficulty reaching the attending provider, please page the [facility] (Director on Call) for [facility] on amion for assistance.

## 2022-05-29 NOTE — Progress Notes (Signed)
Subjective: 6 Days Post-Op Procedure(s) (LRB): INTRAMEDULLARY (IM) NAIL INTERTROCHANTERIC (Right) Patient alert this morning. Patient is pleasantly confused, consistent with her baseline. She denies having any pain in her hip.  States that she is able to move her feet and knees with minimal pain.  States that she can feel and wiggle her toes.   Denies any chest pain, N/V, shortness of breath or fever. Patient is a difficult historian secondary to dementia. We will continue therapy today.  Plan is to go Skilled nursing facility after hospital stay.  Objective: Vital signs in last 24 hours: Temp:  [97.6 F (36.4 C)-98.8 F (37.1 C)] 98.8 F (37.1 C) (04/28 0812) Pulse Rate:  [72-84] 72 (04/28 0812) Resp:  [18-19] 18 (04/28 0812) BP: (110-154)/(54-79) 154/79 (04/28 0812) SpO2:  [94 %-99 %] 94 % (04/28 0812)  Vitals were reviewed, stable  Intake/Output from previous day: 04/27 0701 - 04/28 0700 In: 600 [P.O.:600] Out: -  Intake/Output this shift: No intake/output data recorded.  Patient was found to be incontinent, had a large bowel movement in bed when physical therapy came to work with her this morning  Recent Labs    05/27/22 0514 05/28/22 0538  HGB 10.1* 9.3*   Recent Labs    05/27/22 0514 05/28/22 0538  WBC 13.1* 9.3  RBC 3.43* 3.15*  HCT 28.9* 26.5*  PLT 312 355   Recent Labs    05/27/22 0514 05/28/22 0538  NA 136 136  K 3.3* 4.0  CL 100 103  CO2 26 26  BUN 18 22  CREATININE 0.60 0.75  GLUCOSE 122* 94  CALCIUM 8.3* 8.7*   No results for input(s): "LABPT", "INR" in the last 72 hours.  Labs were reviewed -calcium is increasing from the prior day.  Her white blood cell count has come down significantly, her H&H continues to drop, continue to monitor with BMP and CBC ordered for tomorrow morning.  EXAM General - Patient is sleeping in her chair, when awoken appears pleasant however is Confused.  Consistent with her baseline Extremity - Intact pulses  distally No cellulitis present Compartment soft Patient is able to plantar and dorsiflex without issue Patient states that she is neurologically intact with palpation along her right thigh, lower leg and foot No significant swelling or bruising Dressing - dressing C/D/I and no drainage   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   Obesity, unspecified    Personal history of chemotherapy    Personal history of malignant neoplasm of breast 2000   Personal history of radiation therapy    Personal history of tobacco use, presenting hazards to health    Special screening for malignant neoplasms, colon    Thyroid disease 2012   hyperactive thyroid    Assessment/Plan:   6 Days Post-Op Procedure(s) (LRB): INTRAMEDULLARY (IM) NAIL INTERTROCHANTERIC (Right) Principal Problem:   Closed right hip fracture (HCC) Active Problems:   Acute metabolic encephalopathy   Hypertension   Acquired hypothyroidism   Protein-calorie malnutrition, severe (HCC)   Fall at home, initial encounter   Leukocytosis   Right rib fracture   Chronic diastolic CHF (congestive heart failure) (HCC)  Dementia (HCC)   Depression with anxiety   Intertrochanteric fracture of right hip, closed, initial encounter (HCC)  Estimated body mass index is 20.23 kg/m as calculated from the following:   Height as of this encounter: 4\' 10"  (1.473 m).   Weight as of this encounter: 43.9 kg. Advance diet Up with therapy Pain controlled Vital signs stable White count has improved.  Hgb has decreased to 9.3 from 10.1 on 05/27/2022, continue to monitor with routine labs in the morning. CM to assist with  discharge to SNF  Reorder H&H for tomorrow morning to track any decrease.  Follow up with [facility] in 2 weeks postop WBAT RLE Lovenox 40 mg SQ daily x 14 days at discharge  DVT Prophylaxis - Lovenox, TED hose, and SCDs Weight-Bearing as tolerated to right leg -patient is moving well with PT, able to do both sidestepping and steps forward and backwards, continue with PT until placement.  Patient orthopedically stable for discharge at medical teams discretion. Waiting for placement  Danise Edge, PA-C [facility] 05/29/2022, 9:46 AM

## 2022-05-30 DIAGNOSIS — S72001A Fracture of unspecified part of neck of right femur, initial encounter for closed fracture: Secondary | ICD-10-CM | POA: Diagnosis not present

## 2022-05-30 LAB — BASIC METABOLIC PANEL
Anion gap: 9 (ref 5–15)
BUN: 25 mg/dL — ABNORMAL HIGH (ref 8–23)
CO2: 28 mmol/L (ref 22–32)
Calcium: 9.3 mg/dL (ref 8.9–10.3)
Chloride: 100 mmol/L (ref 98–111)
Creatinine, Ser: 0.74 mg/dL (ref 0.44–1.00)
GFR, Estimated: >60 mL/min (ref >60)
Glucose, Bld: 144 mg/dL — ABNORMAL HIGH (ref 70–99)
Potassium: 4.8 mmol/L (ref 3.5–5.1)
Sodium: 137 mmol/L (ref 135–145)

## 2022-05-30 LAB — CBC
HCT: 29.5 % — ABNORMAL LOW (ref 36.0–46.0)
Hemoglobin: 10.2 g/dL — ABNORMAL LOW (ref 12.0–15.0)
MCH: 29 pg (ref 26.0–34.0)
MCHC: 34.6 g/dL (ref 30.0–36.0)
MCV: 83.8 fL (ref 80.0–100.0)
Platelets: 468 10*3/uL — ABNORMAL HIGH (ref 150–400)
RBC: 3.52 MIL/uL — ABNORMAL LOW (ref 3.87–5.11)
RDW: 14.6 % (ref 11.5–15.5)
WBC: 16.3 10*3/uL — ABNORMAL HIGH (ref 4.0–10.5)
nRBC: 0 % (ref 0.0–0.2)

## 2022-05-30 LAB — MAGNESIUM: Magnesium: 2.1 mg/dL (ref 1.7–2.4)

## 2022-05-30 LAB — PHOSPHORUS: Phosphorus: 3.2 mg/dL (ref 2.5–4.6)

## 2022-05-30 MED ORDER — ENSURE ENLIVE PO LIQD
237.0000 mL | Freq: Three times a day (TID) | ORAL | Status: DC
  Administered 2022-05-30 – 2022-06-01 (×6): 237 mL via ORAL

## 2022-05-30 MED ORDER — MEGESTROL ACETATE 400 MG/10ML PO SUSP
400.0000 mg | Freq: Every day | ORAL | Status: DC
  Administered 2022-05-30 – 2022-06-01 (×3): 400 mg via ORAL
  Filled 2022-05-30 (×3): qty 10

## 2022-05-30 NOTE — Plan of Care (Signed)

## 2022-05-30 NOTE — TOC Progression Note (Signed)
Transition of Care [facility]) - Progression Note    Patient Details  Name: Lisa Martin MRN: [id number] Date of Birth: 1931-08-07  [facility] CM/SW Contact  Garret Reddish, RN Phone Number: 05/30/2022, 10:19 AM  Clinical Narrative:  Patient's daughter has chosen [facility].  I have spoken with Tiffany, admissions Coordinator for [facility].  She informs me that she will have an available bed for patient on 05-30-2021.  I have submitted authorization for SNF to [employer].  I have spoken with [employer] staff Tammy.  I have requested SNF authorization and Ambulance authorization.  Patient will receive short term rehab and then transition to long term care.    I have made provider aware of the above information.      Expected Discharge Plan: Skilled Nursing Facility Barriers to Discharge: No Barriers Identified  Expected Discharge Plan and Services   Discharge Planning Services: CM Consult Post Acute Care Choice: Skilled Nursing Facility Living arrangements for the past 2 months: Single Family Home                                       Social Determinants of Health (SDOH) Interventions SDOH Screenings   Food Insecurity: No Food Insecurity (05/22/2022)  Housing: Low Risk  (05/23/2022)  Transportation Needs: No Transportation Needs (05/22/2022)  Utilities: Not At Risk (05/22/2022)  Tobacco Use: Medium Risk (05/24/2022)    Readmission Risk Interventions     No data to display

## 2022-05-30 NOTE — Progress Notes (Signed)
[facility] Progress Note  Patient: Lisa Martin    ZOX:[id number]  DOA: 05/22/2022     Date of Service: the patient was seen and examined on 05/30/2022  Chief Complaint  Patient presents with   Fall   Brief hospital course: Lisa Martin is a 89 y.o. female with medical history significant of dementia, HTN, dCHF, hypothyroidism, depression with anxiety, GERD, breast cancer (s/p of radiation and chemotherapy), who presents with fall, right hip pain and confusion. Per her daughter at bedside, pt fell accidentally when she was walking around her wheelchair at about 9:00 PM yesterday. No LOC. X-ray of right hip/pelvis: Acute comminuted intertrochanteric fracture of the proximal right femur with varus angulation. X-ray of right femur is negatve for femoral shaft fracture.  Patient is admitted to telemetry bed as inpatient.    Assessment and Plan:  # Closed right hip fracture: X-ray of right hip/pelvis showed acute comminuted intertrochanteric fracture of the proximal right femur with varus angulation. Orthopedic surgeon, Dr. Audelia Acton is consulted,  Patient's GUPTA score perioperative myocardial infarction or cardaic arrest is 2.63 %.  S/p  Right Hip Intramedullary nail done on 4/22 Follow up with [facility] in 2 weeks, WBAT RLE, Lovenox 40 mg SQ daily x 14 days at discharge PT and OT eval for SNF placement TOC consulted for placement  # Anemia most likely due to acute blood loss postop Hb 10.5--8.4--9.3 Monitor H&H and transfuse if hemoglobin less than 7 Resumed Lovenox    # Fall at home, initial encounter: -PT/OT eval for placement, Fall precaution -CK level 113 wnl    # Chronic diastolic CHF (congestive heart failure): 2D echo on 12/11/2017 showed EF> 55% with grade 1 diastolic dysfunction.  Patient has mildly elevated BNP 204, but no leg edema or JVD.  No shortness of breath.  CHF is compensated. -monitor volume status      # Hypertension S/p Dilt-XR, which has been discontinued  due to bradycardia 4/26 started amlodipine 10 mg p.o. daily -IV hydralazine as needed Monitor BP and titrate medications accordingly   # Acquired hypothyroidism, On Synthroid Leukocytosis: WBC 12.7, no fever.  So far no source of infection identified, likely reactive. -urinalysis negative.  On 4/27 WBC 9.3 WNL, leukocytosis resolved.   # Right rib fracture: Chest x-ray showed subacute right lateral 9th and 10th rib fractures -Incentive spirometry -Pain control # Acute metabolic encephalopathy, urinalysis negative   # Dementia, continue Fall precaution, Namenda, Exelon # Depression and anxiety, Continue home medications   # Protein-calorie malnutrition, severe: Body weight 40 kg, BMI 17.22 -Ensure -Nutrition consult, started Megace 400 mg pod, appetite stimulant.  # Hypokalemia, potassium repleted. # Hypophosphatemia, Phos repleted. Monitor electrolytes and replete as needed.  # Vitamin D deficiency: started vitamin D 50,000 units p.o. weekly, follow with PCP to repeat vitamin D level after 3 to 6 months.   Body mass index is 20.23 kg/m.  Nutrition Problem: Severe Malnutrition Etiology: chronic illness (dementia, CHF) Interventions: Interventions: Ensure Enlive (each supplement provides 350kcal and 20 grams of protein), MVI      Diet: Regular diet DVT Prophylaxis: Subcutaneous Lovenox   Advance goals of care discussion: Full code  Family Communication: family was not present at bedside, at the time of interview.  Patient has significant dementia, AAO x 1, resting comfortably.   Disposition:  Pt is from Home, admitted with Fall and right Hip fracture, patient will need SNF placement, which precludes a safe discharge. Discharge to SNF, PT/OT eval done, can be  discharged when bed will be available. As per [facility] patient got accepted at [facility] Auth pending, discharge plan tomorrow a.m.  Subjective: No significant events overnight, patient was sitting  doubly on the recliner, eating breakfast.  AAO x 1 at baseline, seems little confused.  Patient did not offer any complaints.   Physical Exam: General: NAD, lying comfortably Appear in no distress, affect appropriate Eyes: PERRLA ENT: Oral Mucosa Clear, moist  Neck: no JVD,  Cardiovascular: S1 and S2 Present, no Murmur,  Respiratory: good respiratory effort, Bilateral Air entry equal and Decreased, no Crackles, no wheezes Abdomen: Bowel Sound present, Soft and no tenderness,  Skin: no rashes Extremities: no Pedal edema, no calf tenderness, right hip dressing CDI Neurologic: without any new focal findings Gait not checked due to patient safety concerns  Vitals:   05/29/22 1604 05/29/22 2336 05/30/22 0801 05/30/22 0920  BP: (!) 125/92 126/72 (!) 135/119 131/74  Pulse: 87 72  76  Resp: 18 16    Temp: 98 F (36.7 C) 98.1 F (36.7 C)    TempSrc:      SpO2: 99% 99%    Weight:      Height:        Intake/Output Summary (Last 24 hours) at 05/30/2022 1454 Last data filed at 05/30/2022 1349 Gross per 24 hour  Intake 477 ml  Output --  Net 477 ml    Filed Weights   05/22/22 1648 05/23/22 1104  Weight: 43.9 kg 43.9 kg    Data Reviewed: I have personally reviewed and interpreted daily labs, tele strips, imagings as discussed above. I reviewed all nursing notes, pharmacy notes, vitals, pertinent old records I have discussed plan of care as described above with RN and patient/family.  CBC: Recent Labs  Lab 05/25/22 0434 05/26/22 0423 05/27/22 0514 05/28/22 0538 05/30/22 0656  WBC 13.7* 12.7* 13.1* 9.3 16.3*  HGB 8.4* 9.7* 10.1* 9.3* 10.2*  HCT 23.8* 27.5* 28.9* 26.5* 29.5*  MCV 84.7 84.4 84.3 84.1 83.8  PLT 230 279 312 355 468*   Basic Metabolic Panel: Recent Labs  Lab 05/25/22 0434 05/26/22 0423 05/27/22 0514 05/28/22 0538 05/30/22 0656  NA 134* 134* 136 136 137  K 4.4 3.6 3.3* 4.0 4.8  CL 103 100 100 103 100  CO2 25 27 26 26 28   GLUCOSE 108* 123* 122* 94  144*  BUN 28* 24* 18 22 25*  CREATININE 1.01* 0.83 0.60 0.75 0.74  CALCIUM 8.4* 8.4* 8.3* 8.7* 9.3  MG 2.1 2.1 1.9 2.1 2.1  PHOS 2.7 1.8* 2.9 3.9 3.2    Studies: No results found.  Scheduled Meds:  amLODipine  10 mg Oral Daily   cyanocobalamin  1,000 mcg Oral Daily   docusate sodium  100 mg Oral BID   enoxaparin (LOVENOX) injection  30 mg Subcutaneous QPM   feeding supplement  237 mL Oral TID BM   levothyroxine  88 mcg Oral Daily   lidocaine  1 patch Transdermal Q24H   megestrol  400 mg Oral Daily   memantine  5 mg Oral BID   multivitamin with minerals  1 tablet Oral Daily   pantoprazole  40 mg Oral Daily   QUEtiapine  25 mg Oral QHS   rivastigmine  3 mg Oral BID   sertraline  25 mg Oral Daily   Vitamin D (Ergocalciferol)  50,000 Units Oral Q7 days   Continuous Infusions:   PRN Meds: acetaminophen, albuterol, ALPRAZolam, hydrALAZINE, hydrALAZINE, HYDROcodone-acetaminophen, menthol-cetylpyridinium **OR** phenol, methocarbamol, metoCLOPramide **OR** metoCLOPramide (  REGLAN) injection, morphine injection, ondansetron (ZOFRAN) IV, ondansetron **OR** ondansetron (ZOFRAN) IV, senna-docusate, traMADol  Time spent: 35 minutes  Author: Gillis Santa. MD [facility] 05/30/2022 2:54 PM  To reach On-call, see care teams to locate the attending and reach out to them via [email or web address]. If 7PM-7AM, please contact night-coverage If you still have difficulty reaching the attending provider, please page the [facility] (Director on Call) for [facility] on amion for assistance.

## 2022-05-30 NOTE — Progress Notes (Signed)
Physical Therapy Treatment Patient Details Name: Lisa Martin MRN: [id number] DOB: 10/04/1931 Today's Date: 05/30/2022   History of Present Illness Pt is an 89 y.o. female with medical history significant of dementia, HTN, dCHF, hypothyroidism, depression with anxiety, GERD, breast cancer (s/p of radiation and chemotherapy), who presents with fall with right hip pain and confusion. Pt diagnosed with right hip Intertrochanteric fracture and is s/p IM nail.  MD assessment also includes acute metabolic encephalopathy, HTN, R rib fracture, and severe protein-calorie malnutrition.    PT Comments    Pt in bed,  inc urine.  Assisted to EOB with mod a x 1 and stand pivot to [facility] with min a x 1.  Only voids small amount and uses walker to transition to recliner at bedside with min/mod a x 1.  She refuses further gait at this time and is unable to follow verbal or tactile cues for ex.  She is positioned for comfort with safety precautions in place.       Recommendations for follow up therapy are one component of a multi-disciplinary discharge planning process, led by the attending physician.  Recommendations may be updated based on patient status, additional functional criteria and insurance authorization.  Follow Up Recommendations       Assistance Recommended at Discharge Frequent or constant Supervision/Assistance  Patient can return home with the following A little help with walking and/or transfers;A little help with bathing/dressing/bathroom;Assistance with cooking/housework;Assistance with feeding;Assist for transportation;Direct supervision/assist for medications management;Help with stairs or ramp for entrance   Equipment Recommendations       Recommendations for Other Services       Precautions / Restrictions Precautions Precautions: Fall Restrictions Weight Bearing Restrictions: Yes RLE Weight Bearing: Weight bearing as tolerated     Mobility  Bed Mobility Overal bed mobility:  Needs Assistance Bed Mobility: Supine to Sit     Supine to sit: Min assist          Transfers Overall transfer level: Needs assistance Equipment used: Rolling walker (2 wheels) Transfers: Sit to/from Stand, Bed to chair/wheelchair/BSC Sit to Stand: Min assist Stand pivot transfers: Min assist              Ambulation/Gait Ambulation/Gait assistance: Min assist, Mod assist Gait Distance (Feet): 2 Feet Assistive device: Rolling walker (2 wheels) Gait Pattern/deviations: Decreased stance time - right, Narrow base of support, Shuffle           Stairs             Wheelchair Mobility    Modified Rankin (Stroke Patients Only)       Balance Overall balance assessment: Needs assistance Sitting-balance support: Feet supported, Bilateral upper extremity supported Sitting balance-Leahy Scale: Fair     Standing balance support: During functional activity, Bilateral upper extremity supported Standing balance-Leahy Scale: Poor                              Cognition Arousal/Alertness: Awake/alert Behavior During Therapy: Restless Overall Cognitive Status: No family/caregiver present to determine baseline cognitive functioning                                 General Comments: more confused today, unable to follow directions, looking up at the ceiling at times, unsafe to progress mobility        Exercises Other Exercises Other Exercises: to  [facility] to void ,  unable to follow verbal or tactile cues for ex today    General Comments        Pertinent Vitals/Pain Pain Assessment Pain Assessment: No/denies pain Pain Intervention(s): Monitored during session    Home Living                          Prior Function            PT Goals (current goals can now be found in the care plan section) Progress towards PT goals: Progressing toward goals    Frequency    BID      PT Plan Current plan remains appropriate     Co-evaluation              AM-PAC PT "6 Clicks" Mobility   Outcome Measure  Help needed turning from your back to your side while in a flat bed without using bedrails?: A Little Help needed moving from lying on your back to sitting on the side of a flat bed without using bedrails?: A Little Help needed moving to and from a bed to a chair (including a wheelchair)?: A Little Help needed standing up from a chair using your arms (e.g., wheelchair or bedside chair)?: A Little Help needed to walk in hospital room?: A Little Help needed climbing 3-5 steps with a railing? : A Lot 6 Click Score: 17    End of Session Equipment Utilized During Treatment: Gait belt Activity Tolerance: Treatment limited secondary to agitation Patient left: in chair;with call bell/phone within reach;with chair alarm set Nurse Communication: Mobility status PT Visit Diagnosis: Unsteadiness on feet (R26.81);History of falling (Z91.81);Other abnormalities of gait and mobility (R26.89);Muscle weakness (generalized) (M62.81)     Time: 1191-4782 PT Time Calculation (min) (ACUTE ONLY): 18 min  Charges:  $Therapeutic Activity: 8-22 mins                   Danielle Dess, PTA 05/30/22, 10:46 AM

## 2022-05-30 NOTE — Progress Notes (Addendum)
Subjective: 7 Days Post-Op Procedure(s) (LRB): INTRAMEDULLARY (IM) NAIL INTERTROCHANTERIC (Right) Patient is a difficult historian.She is awake this am. Pleasant. She denies having pain in RLE We will continue therapy today.  Plan is to go Skilled nursing facility after hospital stay.  Objective: Vital signs in last 24 hours: Temp:  [98 F (36.7 C)-98.1 F (36.7 C)] 98.1 F (36.7 C) (04/28 2336) Pulse Rate:  [72-87] 72 (04/28 2336) Resp:  [16-18] 16 (04/28 2336) BP: (125-135)/(72-119) 135/119 (04/29 0801) SpO2:  [99 %] 99 % (04/28 2336)  Intake/Output from previous day: 04/28 0701 - 04/29 0700 In: 120 [P.O.:120] Out: -  Intake/Output this shift: No intake/output data recorded.  Recent Labs    05/28/22 0538 05/30/22 0656  HGB 9.3* 10.2*   Recent Labs    05/28/22 0538 05/30/22 0656  WBC 9.3 16.3*  RBC 3.15* 3.52*  HCT 26.5* 29.5*  PLT 355 468*   Recent Labs    05/28/22 0538 05/30/22 0656  NA 136 137  K 4.0 4.8  CL 103 100  CO2 26 28  BUN 22 25*  CREATININE 0.75 0.74  GLUCOSE 94 144*  CALCIUM 8.7* 9.3   No results for input(s): "LABPT", "INR" in the last 72 hours.   EXAM General - Patient is Confused, Alert Extremity - Neurovascular intact Sensation intact distally Intact pulses distally Dorsiflexion/Plantar flexion intact No cellulitis present Compartment soft No significant swelling or bruising - homans sign Dressing - dressing C/D/I and no drainage   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   Obesity, unspecified    Personal history of chemotherapy    Personal history of malignant neoplasm of breast 2000   Personal  history of radiation therapy    Personal history of tobacco use, presenting hazards to health    Special screening for malignant neoplasms, colon    Thyroid disease 2012   hyperactive thyroid    Assessment/Plan:   7 Days Post-Op Procedure(s) (LRB): INTRAMEDULLARY (IM) NAIL INTERTROCHANTERIC (Right) Principal Problem:   Closed right hip fracture (HCC) Active Problems:   Acute metabolic encephalopathy   Hypertension   Acquired hypothyroidism   Protein-calorie malnutrition, severe (HCC)   Fall at home, initial encounter   Leukocytosis   Right rib fracture   Chronic diastolic CHF (congestive heart failure) (HCC)   Dementia (HCC)   Depression with anxiety   Intertrochanteric fracture of right hip, closed, initial encounter (HCC)  Estimated body mass index is 20.23 kg/m as calculated from the following:   Height as of this encounter: 4\' 10"  (1.473 m).   Weight as of this encounter: 43.9 kg. Advance diet Up with therapy Pain controlled Vital signs stable Hgb stable, 10.2, stable CM to assist with discharge to SNF  Follow up with [facility] 2 weeks post op WBAT RLE Lovenox 40 mg SQ daily x 14 days at discharge  DVT Prophylaxis - Lovenox, TED hose, and SCDs Weight-Bearing as tolerated to right leg   T. Cranston Neighbor, PA-C [facility] 05/30/2022, 9:38 AM  Patient seen and examined, agree with above plan.  The patient is doing well status post right hip intramedullary nail,  no concerns at this time.  Pain is controlled.    Reinaldo Berber MD

## 2022-05-30 NOTE — Progress Notes (Signed)
Nutrition Follow-up  DOCUMENTATION CODES:   Severe malnutrition in context of chronic illness  INTERVENTION:  - Modify to Ensure Enlive po TID, each supplement provides 350 kcal and 20 grams of protein.  - Recommend addition of an appetite stimulant.   NUTRITION DIAGNOSIS:   Severe Malnutrition related to chronic illness (dementia, CHF) as evidenced by moderate fat depletion, severe fat depletion, moderate muscle depletion, severe muscle depletion.  GOAL:   Patient will meet greater than or equal to 90% of their needs  MONITOR:   PO intake, Supplement acceptance, Diet advancement  REASON FOR ASSESSMENT:   Consult Assessment of nutrition requirement/status, Hip fracture protocol  ASSESSMENT:   Pt with medical history significant of dementia, HTN, dCHF, hypothyroidism, depression with anxiety, GERD, breast cancer (s/p of radiation and chemotherapy), who presents with fall, right hip pain and confusion.  Meds reviewed:  Vit B12, colace, MVI, Vit D. Labs reviewed: BUN high.   7 days post op from surgical intervention. The pt is currently on a Regular diet. The pt has not been eating well since admission. Pt is oriented x1. Pt is not meeting her needs at this time. RD messaged MD about pt's nutritional status. Recommended addition of appetite stimulant. Pt may benefit from goals of care discussion. RD will increase Ensure supplements to TID as pt has been drinking her supplements. Will continue to monitor POC. Per case manager note, pt planning to discharge to SNF.   Diet Order:   Diet Order             Diet regular Room service appropriate? Yes; Fluid consistency: Thin  Diet effective now                   EDUCATION NEEDS:   Not appropriate for education at this time  Skin:  Skin Assessment: Reviewed RN Assessment  Last BM:  4/27 - type 6  Height:   Ht Readings from Last 1 Encounters:  05/23/22 4\' 10"  (1.473 m)    Weight:   Wt Readings from Last 1  Encounters:  05/23/22 43.9 kg    Ideal Body Weight:  43.9 kg  BMI:  Body mass index is 20.23 kg/m.  Estimated Nutritional Needs:   Kcal:  1350-1550  Protein:  70-85 grams  Fluid:  > 1.3 L  Bethann Humble, RD, LDN, CNSC.

## 2022-05-30 NOTE — Progress Notes (Signed)
Physical Therapy Treatment Patient Details Name: Lisa Martin MRN: [id number] DOB: 1931/06/20 Today's Date: 05/30/2022   History of Present Illness Pt is an 89 y.o. female with medical history significant of dementia, HTN, dCHF, hypothyroidism, depression with anxiety, GERD, breast cancer (s/p of radiation and chemotherapy), who presents with fall with right hip pain and confusion. Pt diagnosed with right hip Intertrochanteric fracture and is s/p IM nail.  MD assessment also includes acute metabolic encephalopathy, HTN, R rib fracture, and severe protein-calorie malnutrition.    PT Comments    Walking by pt room. She is observed sitting sideways in chair with legs over the side.  Assisted pt back to proper position in recliner to prevent injury to legs and she again tries to put legs back over the side.  She is assisted back to bed with mod a x 2 for pt safety as she is more restless today.  Breakfast tray arrived and she is uninterested in eating.  Tech in to feed. Will monitor and return later for gait if she is able but anticipate continuing interventions tomorrow.     Recommendations for follow up therapy are one component of a multi-disciplinary discharge planning process, led by the attending physician.  Recommendations may be updated based on patient status, additional functional criteria and insurance authorization.  Follow Up Recommendations       Assistance Recommended at Discharge Frequent or constant Supervision/Assistance  Patient can return home with the following A little help with walking and/or transfers;A little help with bathing/dressing/bathroom;Assistance with cooking/housework;Assistance with feeding;Assist for transportation;Direct supervision/assist for medications management;Help with stairs or ramp for entrance   Equipment Recommendations       Recommendations for Other Services       Precautions / Restrictions Precautions Precautions: Fall Restrictions Weight  Bearing Restrictions: Yes RLE Weight Bearing: Weight bearing as tolerated     Mobility  Bed Mobility Overal bed mobility: Needs Assistance Bed Mobility: Sit to Supine     Supine to sit: Min assist Sit to supine: Min assist, Mod assist, +2 for physical assistance        Transfers Overall transfer level: Needs assistance Equipment used: None, 2 person hand held assist Transfers: Sit to/from Stand Sit to Stand: Min assist Stand pivot transfers: Mod assist, +2 physical assistance              Ambulation/Gait Ambulation/Gait assistance: Min assist, Mod assist Gait Distance (Feet): 2 Feet Assistive device: Rolling walker (2 wheels) Gait Pattern/deviations: Decreased stance time - right, Narrow base of support, Shuffle           Stairs             Wheelchair Mobility    Modified Rankin (Stroke Patients Only)       Balance Overall balance assessment: Needs assistance Sitting-balance support: Feet supported, Bilateral upper extremity supported Sitting balance-Leahy Scale: Fair     Standing balance support: Bilateral upper extremity supported Standing balance-Leahy Scale: Poor                              Cognition Arousal/Alertness: Awake/alert Behavior During Therapy: Restless Overall Cognitive Status: No family/caregiver present to determine baseline cognitive functioning                                 General Comments: more confused today, unable to follow directions, looking up at the  ceiling at times, unsafe to progress mobility        Exercises Other Exercises Other Exercises: to  [facility] to void , unable to follow verbal or tactile cues for ex today    General Comments        Pertinent Vitals/Pain Pain Assessment Pain Assessment: No/denies pain Pain Intervention(s): Monitored during session    Home Living                          Prior Function            PT Goals (current goals can now be  found in the care plan section) Progress towards PT goals: Progressing toward goals    Frequency    BID      PT Plan Current plan remains appropriate    Co-evaluation              AM-PAC PT "6 Clicks" Mobility   Outcome Measure  Help needed turning from your back to your side while in a flat bed without using bedrails?: A Little Help needed moving from lying on your back to sitting on the side of a flat bed without using bedrails?: A Little Help needed moving to and from a bed to a chair (including a wheelchair)?: A Lot Help needed standing up from a chair using your arms (e.g., wheelchair or bedside chair)?: A Lot Help needed to walk in hospital room?: A Lot Help needed climbing 3-5 steps with a railing? : A Lot 6 Click Score: 14    End of Session Equipment Utilized During Treatment: Gait belt Activity Tolerance: Treatment limited secondary to agitation Patient left: in chair;with call bell/phone within reach;with chair alarm set Nurse Communication: Mobility status PT Visit Diagnosis: Unsteadiness on feet (R26.81);History of falling (Z91.81);Other abnormalities of gait and mobility (R26.89);Muscle weakness (generalized) (M62.81)     Time: 3244-0102 PT Time Calculation (min) (ACUTE ONLY): 8 min  Charges:  $Therapeutic Activity: 8-22 mins                   Danielle Dess, PTA 05/30/22, 10:51 AM

## 2022-05-30 NOTE — Care Management Important Message (Signed)
Important Message  Patient Details  Name: Lisa Martin MRN: [id number] Date of Birth: 10/12/1931   Medicare Important Message Given:  Yes     Johnell Comings 05/30/2022, 10:53 AM

## 2022-05-31 DIAGNOSIS — S72001A Fracture of unspecified part of neck of right femur, initial encounter for closed fracture: Secondary | ICD-10-CM | POA: Diagnosis not present

## 2022-05-31 LAB — CBC
HCT: 28.6 % — ABNORMAL LOW (ref 36.0–46.0)
Hemoglobin: 10.1 g/dL — ABNORMAL LOW (ref 12.0–15.0)
MCH: 29.4 pg (ref 26.0–34.0)
MCHC: 35.3 g/dL (ref 30.0–36.0)
MCV: 83.4 fL (ref 80.0–100.0)
Platelets: 492 10*3/uL — ABNORMAL HIGH (ref 150–400)
RBC: 3.43 MIL/uL — ABNORMAL LOW (ref 3.87–5.11)
RDW: 14.4 % (ref 11.5–15.5)
WBC: 15 10*3/uL — ABNORMAL HIGH (ref 4.0–10.5)
nRBC: 0 % (ref 0.0–0.2)

## 2022-05-31 NOTE — Progress Notes (Addendum)
Subjective: 8 Days Post-Op Procedure(s) (LRB): INTRAMEDULLARY (IM) NAIL INTERTROCHANTERIC (Right) Patient is a difficult historian.  Alert and oriented this morning We will continue therapy today.  Plan is to go Skilled nursing facility after hospital stay.  Objective: Vital signs in last 24 hours: Temp:  [97.6 F (36.4 C)-98.4 F (36.9 C)] 97.6 F (36.4 C) (04/29 2329) Pulse Rate:  [75-100] 100 (04/29 2329) Resp:  [17-18] 18 (04/29 2329) BP: (124-140)/(58-119) 140/95 (04/29 2329) SpO2:  [97 %-98 %] 97 % (04/29 2329)  Intake/Output from previous day: 04/29 0701 - 04/30 0700 In: 477 [P.O.:240] Out: 100 [Urine:100] Intake/Output this shift: No intake/output data recorded.  Recent Labs    05/30/22 0656 05/31/22 0455  HGB 10.2* 10.1*   Recent Labs    05/30/22 0656 05/31/22 0455  WBC 16.3* 15.0*  RBC 3.52* 3.43*  HCT 29.5* 28.6*  PLT 468* 492*   Recent Labs    05/30/22 0656  NA 137  K 4.8  CL 100  CO2 28  BUN 25*  CREATININE 0.74  GLUCOSE 144*  CALCIUM 9.3   No results for input(s): "LABPT", "INR" in the last 72 hours.   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   Obesity, unspecified    Personal history of chemotherapy    Personal history of malignant neoplasm of breast 2000   Personal history of radiation therapy    Personal history  of tobacco use, presenting hazards to health    Special screening for malignant neoplasms, colon    Thyroid disease 2012   hyperactive thyroid    Assessment/Plan:   8 Days Post-Op Procedure(s) (LRB): INTRAMEDULLARY (IM) NAIL INTERTROCHANTERIC (Right) Principal Problem:   Closed right hip fracture (HCC) Active Problems:   Acute metabolic encephalopathy   Hypertension   Acquired hypothyroidism   Protein-calorie malnutrition, severe (HCC)   Fall at home, initial encounter   Leukocytosis   Right rib fracture   Chronic diastolic CHF (congestive heart failure) (HCC)   Dementia (HCC)   Depression with anxiety   Intertrochanteric fracture of right hip, closed, initial encounter (HCC)  Estimated body mass index is 20.23 kg/m as calculated from the following:   Height as of this encounter: 4\' 10"  (1.473 m).   Weight as of this encounter: 43.9 kg. Advance diet Up with therapy Pain controlled Vital signs stable Hgb stable, 10.1 stable CM to assist with discharge to SNF  Follow up with [facility] 2 weeks postop WBAT RLE Lovenox 40 mg SQ daily x 14 days at discharge  DVT Prophylaxis - Lovenox, TED hose, and SCDs Weight-Bearing as tolerated to right leg   T. Cranston Neighbor, PA-C [facility] 05/31/2022, 7:52 AM  Patient seen and examined, agree with above plan.  The patient is doing well status post right hip intramedullary nail, no concerns at this time.  Pain is controlled.  Labs stable. Will go to  rehab after insurance authorization. Orthopedically stable for DC.   Reinaldo Berber MD

## 2022-05-31 NOTE — TOC Progression Note (Signed)
Transition of Care [facility]) - Progression Note    Patient Details  Name: Lisa Martin MRN: [id number] Date of Birth: 1931-10-17  Transition of [facility]) CM/SW Contact  Garret Reddish, RN Phone Number: 05/31/2022, 10:06 AM  Clinical Narrative:   Sherron Monday with Tammy at [facility]  SNF authorization continues to be under nurse review.  Provider made aware.  TOC will continue to follow for discharge planning.      Expected Discharge Plan: Skilled Nursing Facility Barriers to Discharge: No Barriers Identified  Expected Discharge Plan and Services   Discharge Planning Services: CM Consult Post Acute Care Choice: Skilled Nursing Facility Living arrangements for the past 2 months: Single Family Home                                       Social Determinants of Health (SDOH) Interventions SDOH Screenings   Food Insecurity: No Food Insecurity (05/22/2022)  Housing: Low Risk  (05/23/2022)  Transportation Needs: No Transportation Needs (05/22/2022)  Utilities: Not At Risk (05/22/2022)  Tobacco Use: Medium Risk (05/24/2022)    Readmission Risk Interventions     No data to display

## 2022-05-31 NOTE — Progress Notes (Signed)
[facility] Progress Note  Patient: Lisa Martin    GNF:[id number]  DOA: 05/22/2022     Date of Service: the patient was seen and examined on 05/31/2022  Chief Complaint  Patient presents with   Fall   Brief hospital course: Lisa Martin is a 89 y.o. female with medical history significant of dementia, HTN, dCHF, hypothyroidism, depression with anxiety, GERD, breast cancer (s/p of radiation and chemotherapy), who presents with fall, right hip pain and confusion. Per her daughter at bedside, pt fell accidentally when she was walking around her wheelchair at about 9:00 PM yesterday. No LOC. X-ray of right hip/pelvis: Acute comminuted intertrochanteric fracture of the proximal right femur with varus angulation. X-ray of right femur is negatve for femoral shaft fracture.  Patient is admitted to telemetry bed as inpatient.    Assessment and Plan:  # Closed right hip fracture: X-ray of right hip/pelvis showed acute comminuted intertrochanteric fracture of the proximal right femur with varus angulation. Orthopedic surgeon, Dr. Audelia Acton is consulted,  Patient's GUPTA score perioperative myocardial infarction or cardaic arrest is 2.63 %.  S/p  Right Hip Intramedullary nail done on 4/22 Follow up with [facility] in 2 weeks, WBAT RLE, Lovenox 40 mg SQ daily x 14 days at discharge PT and OT eval for SNF placement TOC consulted for placement  # Anemia most likely due to acute blood loss postop Hb 10.5--8.4--9.3 Monitor H&H and transfuse if hemoglobin less than 7 Resumed Lovenox    # Fall at home, initial encounter: -PT/OT eval for placement, Fall precaution -CK level 113 wnl    # Chronic diastolic CHF (congestive heart failure): 2D echo on 12/11/2017 showed EF> 55% with grade 1 diastolic dysfunction.  Patient has mildly elevated BNP 204, but no leg edema or JVD.  No shortness of breath.  CHF is compensated. -monitor volume status      # Hypertension S/p Dilt-XR, which has been discontinued  due to bradycardia 4/26 started amlodipine 10 mg p.o. daily -IV hydralazine as needed Monitor BP and titrate medications accordingly   # Acquired hypothyroidism, On Synthroid Leukocytosis: WBC 12.7, no fever.  So far no source of infection identified, likely reactive. -urinalysis negative.  On 4/27 WBC 9.3 WNL, leukocytosis resolved.   # Right rib fracture: Chest x-ray showed subacute right lateral 9th and 10th rib fractures -Incentive spirometry -Pain control # Acute metabolic encephalopathy, urinalysis negative   # Dementia, continue Fall precaution, Namenda, Exelon # Depression and anxiety, Continue home medications   # Protein-calorie malnutrition, severe: Body weight 40 kg, BMI 17.22 -Ensure -Nutrition consult, started Megace 400 mg pod, appetite stimulant.  # Hypokalemia, potassium repleted. # Hypophosphatemia, Phos repleted. Monitor electrolytes and replete as needed.  # Vitamin D deficiency: started vitamin D 50,000 units p.o. weekly, follow with PCP to repeat vitamin D level after 3 to 6 months.   Body mass index is 20.23 kg/m.  Nutrition Problem: Severe Malnutrition Etiology: chronic illness (dementia, CHF) Interventions: Interventions: Ensure Enlive (each supplement provides 350kcal and 20 grams of protein), MVI      Diet: Regular diet DVT Prophylaxis: Subcutaneous Lovenox   Advance goals of care discussion: Full code  Family Communication: family was not present at bedside, at the time of interview.  Patient has significant dementia, AAO x 1, resting comfortably.   Disposition:  Pt is from Home, admitted with Fall and right Hip fracture, patient will need SNF placement, which precludes a safe discharge. Discharge to SNF, PT/OT eval done, can be  discharged when bed will be available. As per [facility] patient got accepted at [facility] Auth pending, discharge plan tomorrow a.m.  Subjective: No significant events overnight, patient was sitting  doubly on the recliner, eating breakfast.  AAO x 1 at baseline, seems little confused.  Patient did not offer any complaints.   Physical Exam: General: NAD, lying comfortably Appear in no distress, affect appropriate Eyes: PERRLA ENT: Oral Mucosa Clear, moist  Neck: no JVD,  Cardiovascular: S1 and S2 Present, no Murmur,  Respiratory: good respiratory effort, Bilateral Air entry equal and Decreased, no Crackles, no wheezes Abdomen: Bowel Sound present, Soft and no tenderness,  Skin: no rashes Extremities: no Pedal edema, no calf tenderness, right hip dressing CDI Neurologic: without any new focal findings Gait not checked due to patient safety concerns  Vitals:   05/30/22 0920 05/30/22 1530 05/30/22 2329 05/31/22 0801  BP: 131/74 (!) 124/58 (!) 140/95 (!) 135/94  Pulse: 76 75 100 79  Resp:  17 18 16   Temp:  98.4 F (36.9 C) 97.6 F (36.4 C) 98 F (36.7 C)  TempSrc:      SpO2:  98% 97% 99%  Weight:      Height:        Intake/Output Summary (Last 24 hours) at 05/31/2022 1455 Last data filed at 05/31/2022 1046 Gross per 24 hour  Intake 120 ml  Output 100 ml  Net 20 ml    Filed Weights   05/22/22 1648 05/23/22 1104  Weight: 43.9 kg 43.9 kg    Data Reviewed: I have personally reviewed and interpreted daily labs, tele strips, imagings as discussed above. I reviewed all nursing notes, pharmacy notes, vitals, pertinent old records I have discussed plan of care as described above with RN and patient/family.  CBC: Recent Labs  Lab 05/26/22 0423 05/27/22 0514 05/28/22 0538 05/30/22 0656 05/31/22 0455  WBC 12.7* 13.1* 9.3 16.3* 15.0*  HGB 9.7* 10.1* 9.3* 10.2* 10.1*  HCT 27.5* 28.9* 26.5* 29.5* 28.6*  MCV 84.4 84.3 84.1 83.8 83.4  PLT 279 312 355 468* 492*   Basic Metabolic Panel: Recent Labs  Lab 05/25/22 0434 05/26/22 0423 05/27/22 0514 05/28/22 0538 05/30/22 0656  NA 134* 134* 136 136 137  K 4.4 3.6 3.3* 4.0 4.8  CL 103 100 100 103 100  CO2 25 27 26 26  28   GLUCOSE 108* 123* 122* 94 144*  BUN 28* 24* 18 22 25*  CREATININE 1.01* 0.83 0.60 0.75 0.74  CALCIUM 8.4* 8.4* 8.3* 8.7* 9.3  MG 2.1 2.1 1.9 2.1 2.1  PHOS 2.7 1.8* 2.9 3.9 3.2    Studies: No results found.  Scheduled Meds:  amLODipine  10 mg Oral Daily   cyanocobalamin  1,000 mcg Oral Daily   docusate sodium  100 mg Oral BID   enoxaparin (LOVENOX) injection  30 mg Subcutaneous QPM   feeding supplement  237 mL Oral TID BM   levothyroxine  88 mcg Oral Daily   lidocaine  1 patch Transdermal Q24H   megestrol  400 mg Oral Daily   memantine  5 mg Oral BID   multivitamin with minerals  1 tablet Oral Daily   pantoprazole  40 mg Oral Daily   QUEtiapine  25 mg Oral QHS   rivastigmine  3 mg Oral BID   sertraline  25 mg Oral Daily   Vitamin D (Ergocalciferol)  50,000 Units Oral Q7 days   Continuous Infusions:   PRN Meds: acetaminophen, albuterol, ALPRAZolam, hydrALAZINE, hydrALAZINE, HYDROcodone-acetaminophen, menthol-cetylpyridinium **OR**  phenol, methocarbamol, metoCLOPramide **OR** metoCLOPramide (REGLAN) injection, morphine injection, ondansetron **OR** ondansetron (ZOFRAN) IV, senna-docusate, traMADol  Time spent: 35 minutes  Author: Gillis Santa. MD [facility] 05/31/2022 2:55 PM  To reach On-call, see care teams to locate the attending and reach out to them via [email or web address]. If 7PM-7AM, please contact night-coverage If you still have difficulty reaching the attending provider, please page the [facility] (Director on Call) for [facility] on amion for assistance.

## 2022-05-31 NOTE — Progress Notes (Addendum)
Physical Therapy Treatment Patient Details Name: Lisa Martin MRN: [id number] DOB: 12-30-1931 Today's Date: 05/31/2022   History of Present Illness Pt is an 89 y.o. female with medical history significant of dementia, HTN, dCHF, hypothyroidism, depression with anxiety, GERD, breast cancer (s/p of radiation and chemotherapy), who presents with fall with right hip pain and confusion. Pt diagnosed with right hip Intertrochanteric fracture and is s/p IM nail.  MD assessment also includes acute metabolic encephalopathy, HTN, R rib fracture, and severe protein-calorie malnutrition.    PT Comments    Pt is able to transition to EOB with min a x 1 mostly for tactile cues.  Sitting balance x 15 minutes EOB. Has difficulty following verbal and tactile cues for Ex.  She is able to recall her birthday with cues and that she is 40 correctly but then soon after says she is 89 years old.  Cognition remains primary barrier.  She is able to stand and sidestep along bed with min/mod a x 1.  She initiates return to supine.     Recommendations for follow up therapy are one component of a multi-disciplinary discharge planning process, led by the attending physician.  Recommendations may be updated based on patient status, additional functional criteria and insurance authorization.  Follow Up Recommendations       Assistance Recommended at Discharge Frequent or constant Supervision/Assistance  Patient can return home with the following A little help with walking and/or transfers;A little help with bathing/dressing/bathroom;Assistance with cooking/housework;Assistance with feeding;Assist for transportation;Direct supervision/assist for medications management;Help with stairs or ramp for entrance   Equipment Recommendations       Recommendations for Other Services       Precautions / Restrictions Precautions Precautions: Fall Restrictions Weight Bearing Restrictions: Yes RLE Weight Bearing: Weight bearing as  tolerated     Mobility  Bed Mobility Overal bed mobility: Needs Assistance Bed Mobility: Supine to Sit, Sit to Supine     Supine to sit: Min guard Sit to supine: Min assist   General bed mobility comments: tactile cues to initiate transitions due to cognition but strength wise she is able to so without assist when she wants    Transfers Overall transfer level: Needs assistance Equipment used: Rolling walker (2 wheels) Transfers: Sit to/from Stand Sit to Stand: Min assist                Ambulation/Gait Ambulation/Gait assistance: Min assist, Mod assist Gait Distance (Feet): 3 Feet Assistive device: Rolling walker (2 wheels) Gait Pattern/deviations: Decreased stance time - right, Narrow base of support, Shuffle, Step-to pattern Gait velocity: decreased     General Gait Details: sidesteps along bed wtih heavy cues   Stairs             Wheelchair Mobility    Modified Rankin (Stroke Patients Only)       Balance Overall balance assessment: Needs assistance Sitting-balance support: Feet supported, Bilateral upper extremity supported Sitting balance-Leahy Scale: Fair Sitting balance - Comments: able to sit unsupported once situated. Postural control: Posterior lean, Right lateral lean Standing balance support: Bilateral upper extremity supported Standing balance-Leahy Scale: Poor Standing balance comment: external support required to maintain standing balance                            Cognition Arousal/Alertness: Awake/alert Behavior During Therapy: Restless Overall Cognitive Status: No family/caregiver present to determine baseline cognitive functioning  Exercises      General Comments        Pertinent Vitals/Pain Pain Assessment Pain Assessment: Faces Faces Pain Scale: Hurts a little bit Pain Descriptors / Indicators: Discomfort, Grimacing Pain Intervention(s): Limited  activity within patient's tolerance, Monitored during session    Home Living                          Prior Function            PT Goals (current goals can now be found in the care plan section) Progress towards PT goals: Progressing toward goals    Frequency    7X/week      PT Plan Frequency needs to be updated    Co-evaluation              AM-PAC PT "6 Clicks" Mobility   Outcome Measure  Help needed turning from your back to your side while in a flat bed without using bedrails?: A Little Help needed moving from lying on your back to sitting on the side of a flat bed without using bedrails?: A Little Help needed moving to and from a bed to a chair (including a wheelchair)?: A Lot Help needed standing up from a chair using your arms (e.g., wheelchair or bedside chair)?: A Lot Help needed to walk in hospital room?: A Lot Help needed climbing 3-5 steps with a railing? : Total 6 Click Score: 13    End of Session Equipment Utilized During Treatment: Gait belt Activity Tolerance: Patient tolerated treatment well Patient left: in bed;with call bell/phone within reach;with bed alarm set Nurse Communication: Mobility status PT Visit Diagnosis: Unsteadiness on feet (R26.81);History of falling (Z91.81);Other abnormalities of gait and mobility (R26.89);Muscle weakness (generalized) (M62.81)     Time: 4098-1191 PT Time Calculation (min) (ACUTE ONLY): 16 min  Charges:  $Therapeutic Activity: 8-22 mins                  Danielle Dess, PTA 05/31/22, 1:22 PM

## 2022-05-31 NOTE — Progress Notes (Signed)
Occupational Therapy Treatment Patient Details Name: Lisa Martin MRN: [id number] DOB: [birth year] Today's Date: 05/31/2022   History of present illness Pt is an 89 y.o. female with medical history significant of dementia, HTN, dCHF, hypothyroidism, depression with anxiety, GERD, breast cancer (s/p of radiation and chemotherapy), who presents with fall with right hip pain and confusion. Pt diagnosed with right hip Intertrochanteric fracture and is s/p IM nail.  MD assessment also includes acute metabolic encephalopathy, HTN, R rib fracture, and severe protein-calorie malnutrition.   OT comments  Pt seen for OT tx this date. Pt malpositioned in the bed, gown twisted and bed linens disheveled. Pt pleasant, oriented to self, and able to follow simple commands this date for repositioning herself in the bed. She required MAX A in addition to heavy BUE use on bed rails to complete with notable improvement in posture and safety for self feeding. Pt required set up of tray over lap and she was then able to eat a cookie without direct assist. Pt denied pain today. Continue with POC.    Recommendations for follow up therapy are one component of a multi-disciplinary discharge planning process, led by the attending physician.  Recommendations may be updated based on patient status, additional functional criteria and insurance authorization.    Assistance Recommended at Discharge Frequent or constant Supervision/Assistance  Patient can return home with the following  Two people to help with walking and/or transfers;Two people to help with bathing/dressing/bathroom;Assistance with cooking/housework;Assist for transportation;Help with stairs or ramp for entrance;Assistance with feeding;Direct supervision/assist for financial management;Direct supervision/assist for medications management   Equipment Recommendations  Other (comment) (defer to next venue of care)    Recommendations for Other Services       Precautions / Restrictions Precautions Precautions: Fall Restrictions Weight Bearing Restrictions: Yes RLE Weight Bearing: Weight bearing as tolerated       Mobility Bed Mobility Overal bed mobility: Needs Assistance             General bed mobility comments: MAX A for repositioning and boosting up in the bed to maximize pressure relief and safety for eating, with VC for hand placement to promote pt's active participation.    Transfers                         Balance     Sitting balance-Leahy Scale: Poor Sitting balance - Comments: Pt initially listing a little to L side supported with HOB elevated but she realized this and was able to self correct                                   ADL either performed or assessed with clinical judgement   ADL Overall ADL's : Needs assistance/impaired Eating/Feeding: Set up Eating/Feeding Details (indicate cue type and reason): Set up for self feeding cookie                                        Extremity/Trunk Assessment              Vision       Perception     Praxis      Cognition Arousal/Alertness: Awake/alert Behavior During Therapy: WFL for tasks assessed/performed Overall Cognitive Status: No family/caregiver present to determine baseline cognitive functioning  General Comments: pleasant, oriented to self, able to answer simple questions appropriately, VC for simple commands        Exercises      Shoulder Instructions       General Comments      Pertinent Vitals/ Pain       Pain Assessment Pain Assessment: Faces Faces Pain Scale: No hurt  Home Living                                          Prior Functioning/Environment              Frequency  Min 1X/week        Progress Toward Goals  OT Goals(current goals can now be found in the care plan section)  Progress towards OT goals:  Progressing toward goals  Acute Rehab OT Goals Patient Stated Goal: none stated OT Goal Formulation: Patient unable to participate in goal setting Time For Goal Achievement: 06/08/22 Potential to Achieve Goals: Fair  Plan Discharge plan remains appropriate;Frequency remains appropriate    Co-evaluation                 AM-PAC OT "6 Clicks" Daily Activity     Outcome Measure   Help from another person eating meals?: A Little Help from another person taking care of personal grooming?: A Lot Help from another person toileting, which includes using toliet, bedpan, or urinal?: Total Help from another person bathing (including washing, rinsing, drying)?: Total Help from another person to put on and taking off regular upper body clothing?: A Lot Help from another person to put on and taking off regular lower body clothing?: Total 6 Click Score: 10    End of Session    OT Visit Diagnosis: Other abnormalities of gait and mobility (R26.89);Muscle weakness (generalized) (M62.81);History of falling (Z91.81);Pain;Other symptoms and signs involving cognitive function Pain - Right/Left: Right Pain - part of body: Hip   Activity Tolerance Patient tolerated treatment well   Patient Left in bed;with call bell/phone within reach;with bed alarm set   Nurse Communication          Time: 1610-9604 OT Time Calculation (min): 9 min  Charges: OT General Charges $OT Visit: 1 Visit OT Treatments $Self Care/Home Management : 8-22 mins  Arman Filter., MPH, MS, OTR/L ascom [phone] 05/31/22, 4:40 PM

## 2022-06-01 DIAGNOSIS — F32A Depression, unspecified: Secondary | ICD-10-CM | POA: Diagnosis not present

## 2022-06-01 DIAGNOSIS — S72001A Fracture of unspecified part of neck of right femur, initial encounter for closed fracture: Secondary | ICD-10-CM | POA: Diagnosis not present

## 2022-06-01 DIAGNOSIS — F02B3 Dementia in other diseases classified elsewhere, moderate, with mood disturbance: Secondary | ICD-10-CM | POA: Diagnosis not present

## 2022-06-01 DIAGNOSIS — I10 Essential (primary) hypertension: Secondary | ICD-10-CM | POA: Diagnosis not present

## 2022-06-01 DIAGNOSIS — L603 Nail dystrophy: Secondary | ICD-10-CM | POA: Diagnosis not present

## 2022-06-01 DIAGNOSIS — R102 Pelvic and perineal pain: Secondary | ICD-10-CM | POA: Diagnosis not present

## 2022-06-01 DIAGNOSIS — I5043 Acute on chronic combined systolic (congestive) and diastolic (congestive) heart failure: Secondary | ICD-10-CM | POA: Diagnosis not present

## 2022-06-01 DIAGNOSIS — Z96642 Presence of left artificial hip joint: Secondary | ICD-10-CM | POA: Diagnosis not present

## 2022-06-01 DIAGNOSIS — S2241XD Multiple fractures of ribs, right side, subsequent encounter for fracture with routine healing: Secondary | ICD-10-CM | POA: Diagnosis not present

## 2022-06-01 DIAGNOSIS — K219 Gastro-esophageal reflux disease without esophagitis: Secondary | ICD-10-CM | POA: Diagnosis not present

## 2022-06-01 DIAGNOSIS — R2689 Other abnormalities of gait and mobility: Secondary | ICD-10-CM | POA: Diagnosis not present

## 2022-06-01 DIAGNOSIS — E538 Deficiency of other specified B group vitamins: Secondary | ICD-10-CM | POA: Diagnosis not present

## 2022-06-01 DIAGNOSIS — B351 Tinea unguium: Secondary | ICD-10-CM | POA: Diagnosis not present

## 2022-06-01 DIAGNOSIS — K5900 Constipation, unspecified: Secondary | ICD-10-CM | POA: Diagnosis not present

## 2022-06-01 DIAGNOSIS — I739 Peripheral vascular disease, unspecified: Secondary | ICD-10-CM | POA: Diagnosis not present

## 2022-06-01 DIAGNOSIS — Z853 Personal history of malignant neoplasm of breast: Secondary | ICD-10-CM | POA: Diagnosis not present

## 2022-06-01 DIAGNOSIS — W19XXXA Unspecified fall, initial encounter: Secondary | ICD-10-CM | POA: Diagnosis not present

## 2022-06-01 DIAGNOSIS — Z7401 Bed confinement status: Secondary | ICD-10-CM | POA: Diagnosis not present

## 2022-06-01 DIAGNOSIS — R6889 Other general symptoms and signs: Secondary | ICD-10-CM | POA: Diagnosis not present

## 2022-06-01 DIAGNOSIS — D62 Acute posthemorrhagic anemia: Secondary | ICD-10-CM | POA: Diagnosis not present

## 2022-06-01 DIAGNOSIS — F331 Major depressive disorder, recurrent, moderate: Secondary | ICD-10-CM | POA: Diagnosis not present

## 2022-06-01 DIAGNOSIS — F03A18 Unspecified dementia, mild, with other behavioral disturbance: Secondary | ICD-10-CM | POA: Diagnosis not present

## 2022-06-01 DIAGNOSIS — I959 Hypotension, unspecified: Secondary | ICD-10-CM | POA: Diagnosis not present

## 2022-06-01 DIAGNOSIS — D72829 Elevated white blood cell count, unspecified: Secondary | ICD-10-CM | POA: Diagnosis not present

## 2022-06-01 DIAGNOSIS — S72141D Displaced intertrochanteric fracture of right femur, subsequent encounter for closed fracture with routine healing: Secondary | ICD-10-CM | POA: Diagnosis not present

## 2022-06-01 DIAGNOSIS — I110 Hypertensive heart disease with heart failure: Secondary | ICD-10-CM | POA: Diagnosis not present

## 2022-06-01 DIAGNOSIS — M6281 Muscle weakness (generalized): Secondary | ICD-10-CM | POA: Diagnosis not present

## 2022-06-01 DIAGNOSIS — W19XXXD Unspecified fall, subsequent encounter: Secondary | ICD-10-CM | POA: Diagnosis not present

## 2022-06-01 DIAGNOSIS — F418 Other specified anxiety disorders: Secondary | ICD-10-CM | POA: Diagnosis not present

## 2022-06-01 DIAGNOSIS — E039 Hypothyroidism, unspecified: Secondary | ICD-10-CM | POA: Diagnosis not present

## 2022-06-01 DIAGNOSIS — E43 Unspecified severe protein-calorie malnutrition: Secondary | ICD-10-CM | POA: Diagnosis not present

## 2022-06-01 DIAGNOSIS — I5032 Chronic diastolic (congestive) heart failure: Secondary | ICD-10-CM | POA: Diagnosis not present

## 2022-06-01 DIAGNOSIS — Z23 Encounter for immunization: Secondary | ICD-10-CM | POA: Diagnosis not present

## 2022-06-01 DIAGNOSIS — G301 Alzheimer's disease with late onset: Secondary | ICD-10-CM | POA: Diagnosis not present

## 2022-06-01 DIAGNOSIS — Z7901 Long term (current) use of anticoagulants: Secondary | ICD-10-CM | POA: Diagnosis not present

## 2022-06-01 DIAGNOSIS — G9341 Metabolic encephalopathy: Secondary | ICD-10-CM | POA: Diagnosis not present

## 2022-06-01 MED ORDER — ENSURE ENLIVE PO LIQD: 237.0000 mL | Freq: Three times a day (TID) | ORAL | 12 refills | Status: DC

## 2022-06-01 MED ORDER — AMLODIPINE BESYLATE 5 MG PO TABS: 5.0000 mg | ORAL_TABLET | Freq: Every day | ORAL | Status: DC

## 2022-06-01 MED ORDER — VITAMIN D (ERGOCALCIFEROL) 1.25 MG (50000 UNIT) PO CAPS: 50000.0000 [IU] | ORAL_CAPSULE | ORAL | 0 refills | Status: AC

## 2022-06-01 MED ORDER — ADULT MULTIVITAMIN W/MINERALS CH: 1.0000 | ORAL_TABLET | Freq: Every day | ORAL | Status: DC

## 2022-06-01 MED ORDER — MEGESTROL ACETATE 400 MG/10ML PO SUSP: 400.0000 mg | Freq: Every day | ORAL | 0 refills | Status: DC

## 2022-06-01 MED ORDER — ALPRAZOLAM 0.25 MG PO TABS: 0.2500 mg | ORAL_TABLET | Freq: Two times a day (BID) | ORAL | 0 refills | Status: DC | PRN

## 2022-06-01 MED ORDER — ACETAMINOPHEN 325 MG PO TABS: 325.0000 mg | ORAL_TABLET | Freq: Four times a day (QID) | ORAL | Status: DC | PRN

## 2022-06-01 NOTE — TOC Progression Note (Signed)
Transition of Care [facility]) - Progression Note    Patient Details  Name: Lisa Martin MRN: [id number] Date of Birth: 14-May-1931  Transition of Care [facility]) CM/SW Contact  Garret Reddish, RN Phone Number: 06/01/2022, 9:55 AM  Clinical Narrative:   Call received from [employer].  They inform me that patient will require a peer to peer with the Medical Director.  Medical Directors number is [phone].  Peer to Peer must be completed by 12 noon today.  I have informed Dr. Lucianne Muss of the above information.    I have spoken with Dr. Logan Bores.  I have informed Dr. Logan Bores that the plan is for patient to receive short-term rehab for the hip fracture and then transition to long-term care. Patient has a SNF bed at [facility] and will transition to Long-term care at [facility] when short-term rehab is completed.    I have informed patient's daughter [name] of the above information.    Dr. Logan Bores with [employer] has informed me that he will approve patient for short-term rehab for a short period of time.  Dr. Logan Bores is aware that patient will then transition to long-term care.    I have left message for [facility] with [facility] that patient will be a discharge to the [facility] today.  Patient will be going to room 211A.  The number to call report is [phone].   Ambulance transport has been approved by [employer].  The ambulance approval number is [id number].   I will arrange Ambulance transport with [facility].  I have informed staff nurse of the above information.         Expected Discharge Plan: Skilled Nursing Facility Barriers to Discharge: No Barriers Identified  Expected Discharge Plan and Services   Discharge Planning Services: CM Consult Post Acute Care Choice: Skilled Nursing Facility Living arrangements for the past 2 months: Single Family Home                                       Social Determinants of Health (SDOH)  Interventions SDOH Screenings   Food Insecurity: No Food Insecurity (05/22/2022)  Housing: Low Risk  (05/23/2022)  Transportation Needs: No Transportation Needs (05/22/2022)  Utilities: Not At Risk (05/22/2022)  Tobacco Use: Medium Risk (05/24/2022)    Readmission Risk Interventions     No data to display

## 2022-06-01 NOTE — TOC Transition Note (Signed)
Transition of Care [facility]) - CM/SW Discharge Note   Patient Details  Name: Lisa Martin MRN: [id number] Date of Birth: [birth year]  Transition of Care [facility]) CM/SW Contact:  Garret Reddish, RN Phone Number: 06/01/2022, 10:05 AM   Clinical Narrative: Peer to Peer has been completed.  Patient has been approved for SNF.  Patient will be going to [facility] for Short-term rehab.  Patient will transition to long-term care once she completed short-term rehab.   Patient's daughter [name] has been informed that patient will discharge to [facility] today.    [facility] will transport patient to [facility] today.    I have informed staff nurse of the above information.   Patient will be going to room 211A and the number to call report is [phone].      Final next level of care: Skilled Nursing Facility (Patient will discharge to SNF for Short-term rehab and then transition to Long-term care.) Barriers to Discharge: No Barriers Identified   Patient Goals and CMS Choice   Choice offered to / list presented to : Adult Children  Discharge Placement                Patient [facility] bed at: [facility] Patient to be transferred to facility by: [facility] Name of family member notified: Blenda Bridegroom Patient and family notified of of transfer: 06/01/22  Discharge Plan and Services Additional resources added to the After Visit Summary for     Discharge Planning Services: CM Consult Post Acute Care Choice: Skilled Nursing Facility                               Social Determinants of Health (SDOH) Interventions SDOH Screenings   Food Insecurity: No Food Insecurity (05/22/2022)  Housing: Low Risk  (05/23/2022)  Transportation Needs: No Transportation Needs (05/22/2022)  Utilities: Not At Risk (05/22/2022)  Tobacco Use: Medium Risk (05/24/2022)     Readmission Risk Interventions     No data to display

## 2022-06-01 NOTE — TOC Progression Note (Signed)
Transition of Care [facility]) - Progression Note    Patient Details  Name: Lisa Martin MRN: [id number] Date of Birth: 12-11-1931  Transition of Care [facility]) CM/SW Contact  Garret Reddish, RN Phone Number: 06/01/2022, 11:48 AM  Clinical Narrative:    I have received a call from [facility] with [employer].  She informs me that authorization approval number is [id number].  Patient has been approved for 7 days.    I have made Tiffany, Admission Coordinator for [facility] aware of above information.      Discharge Planning Services: CM Consult Post Acute Care Choice: Skilled Nursing Facility Living arrangements for the past 2 months: Single Family Home Expected Discharge Date: 06/01/22                                     Social Determinants of Health (SDOH) Interventions SDOH Screenings   Food Insecurity: No Food Insecurity (05/22/2022)  Housing: Low Risk  (05/23/2022)  Transportation Needs: No Transportation Needs (05/22/2022)  Utilities: Not At Risk (05/22/2022)  Tobacco Use: Medium Risk (05/24/2022)    Readmission Risk Interventions     No data to display

## 2022-06-01 NOTE — Consult Note (Signed)
[facility] Customer service manager [name]) Accountable Care Organization (ACO) [name] Liaison Note  06/01/2022  Lisa Martin 11/30/1931 [id number]  Location: [facility] RN Hospital Liaison screened the patient remotely at [facility].  Insurance: [employer]   Lisa Martin is a 89 y.o. female who is a Primary Care Patient of Danella Penton, MD. The patient was screened  for readmission hospitalization with noted high risk score for unplanned readmission risk with 1 IP/1 ED in 6 months.  The patient was assessed for potential [facility]) Care Management service needs for post hospital transition for care coordination. Review of patient's electronic medical record reveals patient will discharge to [facility]. Pt's needs will be met by the SNF facility.   [facility] Care Management/Population Health does not replace or interfere with any arrangements made by the Inpatient Transition of Care team.   For questions contact:   Elliot Cousin, RN, BSN [facility] Liaison [place]   [facility]  Population Health Office Hours MTWF 8:00 am to 6 pm off on Thursday [phone] mobile [phone] [Office toll free line][facility] Office Hours are M-F 8:30 - 5 pm 24 hour nurse advise line [phone] Conceirge  [email or web address]

## 2022-06-01 NOTE — Progress Notes (Addendum)
Subjective: 9 Days Post-Op Procedure(s) (LRB): INTRAMEDULLARY (IM) NAIL INTERTROCHANTERIC (Right) Patient is a difficult historian.  Alert this morning We will continue therapy today.  Plan is to go Skilled nursing facility after hospital stay.  Objective: Vital signs in last 24 hours: Temp:  [97.5 F (36.4 C)-98 F (36.7 C)] 97.5 F (36.4 C) (04/30 2337) Pulse Rate:  [67-81] 68 (05/01 0011) Resp:  [15-19] 15 (05/01 0011) BP: (95-135)/(50-94) 115/69 (05/01 0011) SpO2:  [96 %-99 %] 97 % (04/30 2337)  Intake/Output from previous day: 04/30 0701 - 05/01 0700 In: 240 [P.O.:240] Out: -  Intake/Output this shift: No intake/output data recorded.  Recent Labs    05/30/22 0656 05/31/22 0455  HGB 10.2* 10.1*   Recent Labs    05/30/22 0656 05/31/22 0455  WBC 16.3* 15.0*  RBC 3.52* 3.43*  HCT 29.5* 28.6*  PLT 468* 492*   Recent Labs    05/30/22 0656  NA 137  K 4.8  CL 100  CO2 28  BUN 25*  CREATININE 0.74  GLUCOSE 144*  CALCIUM 9.3   No results for input(s): "LABPT", "INR" in the last 72 hours.   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   Obesity, unspecified    Personal history of chemotherapy    Personal history of malignant neoplasm of breast 2000   Personal history of radiation therapy    Personal history of tobacco use, presenting  hazards to health    Special screening for malignant neoplasms, colon    Thyroid disease 2012   hyperactive thyroid    Assessment/Plan:   9 Days Post-Op Procedure(s) (LRB): INTRAMEDULLARY (IM) NAIL INTERTROCHANTERIC (Right) Principal Problem:   Closed right hip fracture (HCC) Active Problems:   Acute metabolic encephalopathy   Hypertension   Acquired hypothyroidism   Protein-calorie malnutrition, severe (HCC)   Fall at home, initial encounter   Leukocytosis   Right rib fracture   Chronic diastolic CHF (congestive heart failure) (HCC)   Dementia (HCC)   Depression with anxiety   Intertrochanteric fracture of right hip, closed, initial encounter (HCC)  Estimated body mass index is 20.23 kg/m as calculated from the following:   Height as of this encounter: 4\' 10"  (1.473 m).   Weight as of this encounter: 43.9 kg. Advance diet Up with therapy Pain controlled Vital signs stable Hgb stable, 10.1 stable CM to assist with discharge to SNF  Follow up with [facility] 2 weeks postop WBAT RLE Lovenox 40 mg SQ daily x 14 days at discharge  DVT Prophylaxis - Lovenox, TED hose, and SCDs Weight-Bearing as tolerated to right leg   T. Cranston Neighbor, PA-C [facility] 06/01/2022, 7:46 AM  Patient seen and examined, agree with above plan.  The patient is doing well status post right hip intramedullary nail, no concerns at this time.  Pain is controlled. Ortho stable for DC to rehab.  Reinaldo Berber MD

## 2022-06-01 NOTE — Progress Notes (Signed)
Physical Therapy Treatment Patient Details Name: Lisa Martin MRN: [id number] DOB: [birth year] Today's Date: 06/01/2022   History of Present Illness Pt is an 89 y.o. female with medical history significant of dementia, HTN, dCHF, hypothyroidism, depression with anxiety, GERD, breast cancer (s/p of radiation and chemotherapy), who presents with fall with right hip pain and confusion. Pt diagnosed with right hip Intertrochanteric fracture and is s/p IM nail.  MD assessment also includes acute metabolic encephalopathy, HTN, R rib fracture, and severe protein-calorie malnutrition.    PT Comments    Pt somewhat lethargic during the session but responded well to verbal stimulation.  Pt required physical assistance with rolling left/right and for sup to/from sit.  Once in sitting at the EOB pt required physical assistance to maintain static sitting balance with frequent instances of posterior LOB.  Standing deferred this session secondary to very poor static sitting balance with limited improvement noted during training and anterior weight shifting activities.  Pt will benefit from a trial of continued PT services upon discharge to safely address deficits listed in patient problem list for decreased caregiver assistance and eventual return to PLOF.     Recommendations for follow up therapy are one component of a multi-disciplinary discharge planning process, led by the attending physician.  Recommendations may be updated based on patient status, additional functional criteria and insurance authorization.  Follow Up Recommendations  Can patient physically be transported by private vehicle: No    Assistance Recommended at Discharge Frequent or constant Supervision/Assistance  Patient can return home with the following Assistance with cooking/housework;Assistance with feeding;Assist for transportation;Direct supervision/assist for medications management;Help with stairs or ramp for entrance;A lot of help with  bathing/dressing/bathroom;A lot of help with walking and/or transfers   Equipment Recommendations  Other (comment) (TBD)    Recommendations for Other Services       Precautions / Restrictions Precautions Precautions: Fall Restrictions Weight Bearing Restrictions: Yes RLE Weight Bearing: Weight bearing as tolerated     Mobility  Bed Mobility Overal bed mobility: Needs Assistance Bed Mobility: Supine to Sit, Sit to Supine, Rolling Rolling: Mod assist   Supine to sit: Mod assist Sit to supine: Mod assist   General bed mobility comments: Mod A for BLE and trunk control with mod verbal cues for use of bed rail    Transfers                   General transfer comment: NT secondary to pt with heavy posterior lean in sitting    Ambulation/Gait                   Stairs             Wheelchair Mobility    Modified Rankin (Stroke Patients Only)       Balance Overall balance assessment: Needs assistance Sitting-balance support: Feet supported, Bilateral upper extremity supported Sitting balance-Leahy Scale: Poor   Postural control: Posterior lean                                  Cognition Arousal/Alertness: Lethargic Behavior During Therapy: WFL for tasks assessed/performed Overall Cognitive Status: No family/caregiver present to determine baseline cognitive functioning                                          Exercises  Total Joint Exercises Heel Slides: AAROM, 10 reps, Both, Strengthening, 5 reps Hip ABduction/ADduction: AAROM, Strengthening, Both, 10 reps, 5 reps Straight Leg Raises: AAROM, Strengthening, Both, 10 reps, 5 reps Other Exercises Other Exercises: Anterior weight shifting in sitting to address posterior instability    General Comments        Pertinent Vitals/Pain Pain Assessment Pain Assessment: PAINAD Breathing: normal Negative Vocalization: occasional moan/groan, low speech,  negative/disapproving quality Facial Expression: smiling or inexpressive Body Language: tense, distressed pacing, fidgeting Consolability: distracted or reassured by voice/touch PAINAD Score: 3 Pain Location: R hip with movement Pain Descriptors / Indicators: Discomfort, Grimacing, Moaning Pain Intervention(s): Patient requesting pain meds-RN notified, Monitored during session, Repositioned, Limited activity within patient's tolerance    Home Living                          Prior Function            PT Goals (current goals can now be found in the care plan section) Progress towards PT goals: Not progressing toward goals - comment (limited by weakness and cognition)    Frequency    7X/week      PT Plan Current plan remains appropriate    Co-evaluation              AM-PAC PT "6 Clicks" Mobility   Outcome Measure  Help needed turning from your back to your side while in a flat bed without using bedrails?: A Lot Help needed moving from lying on your back to sitting on the side of a flat bed without using bedrails?: A Lot Help needed moving to and from a bed to a chair (including a wheelchair)?: A Lot Help needed standing up from a chair using your arms (e.g., wheelchair or bedside chair)?: A Lot Help needed to walk in hospital room?: Total Help needed climbing 3-5 steps with a railing? : Total 6 Click Score: 10    End of Session Equipment Utilized During Treatment: Gait belt Activity Tolerance: Patient tolerated treatment well Patient left: in bed;with call bell/phone within reach;with bed alarm set;Other (comment) (fall mat in place) Nurse Communication: Mobility status PT Visit Diagnosis: Unsteadiness on feet (R26.81);History of falling (Z91.81);Other abnormalities of gait and mobility (R26.89);Muscle weakness (generalized) (M62.81)     Time: 1610-9604 PT Time Calculation (min) (ACUTE ONLY): 27 min  Charges:  $Therapeutic Exercise: 8-22  mins $Therapeutic Activity: 8-22 mins                     D. Scott Zacary Bauer PT, DPT 06/01/22, 11:16 AM

## 2022-06-01 NOTE — Discharge Summary (Signed)
[facility] Discharge Summary   Patient: Lisa Martin ZOX:[id number]  PCP: Danella Penton, MD  Date of admission: 05/22/2022   Date of discharge:  06/01/2022     Discharge Diagnoses:  Principal Problem:   Closed right hip fracture [facility]) Active Problems:   Fall at home, initial encounter   Chronic diastolic CHF (congestive heart failure) (HCC)   Acute metabolic encephalopathy   Hypertension   Acquired hypothyroidism   Leukocytosis   Right rib fracture   Dementia (HCC)   Depression with anxiety   Protein-calorie malnutrition, severe (HCC)   Intertrochanteric fracture of right hip, closed, initial encounter [facility])   Admitted From: Home Disposition:  SNF   Recommendations for Outpatient Follow-up:  Follow-up with PCP, patient is to be seen by an MD in 1 to 2 days at the facility for pain management Follow-up with orthopedic surgery in 1 to 2 weeks for postop check. Follow up LABS/TEST:     Follow-up Information     Evon Slack, PA-C Follow up in 2 week(s).   Specialties: Orthopedic Surgery, Emergency Medicine Contact information: [address] [place] [address] [phone]                Diet recommendation: Regular diet  Activity: The patient is advised to gradually reintroduce usual activities, as tolerated  Discharge Condition: stable  Code Status: Full code   History of present illness: As per the H and P dictated on admission  Hospital Course:  Lisa Martin is a 89 y.o. female with medical history significant of dementia, HTN, dCHF, hypothyroidism, depression with anxiety, GERD, breast cancer (s/p of radiation and chemotherapy), who presents with fall, right hip pain and confusion. Per her daughter at bedside, pt fell accidentally when she was walking around her wheelchair at about 9:00 PM yesterday. No LOC. X-ray of right hip/pelvis: Acute comminuted intertrochanteric fracture of the proximal right femur with varus angulation. X-ray of  right femur is negatve for femoral shaft fracture.  Patient is admitted to telemetry bed as inpatient.    Assessment and Plan: # Closed right hip fracture: X-ray of right hip/pelvis showed acute comminuted intertrochanteric fracture of the proximal right femur with varus angulation. Orthopedic surgeon, Dr. Audelia Acton is consulted, Patient's GUPTA score perioperative myocardial infarction or cardaic arrest was 2.63 %. S/p  Right Hip Intramedullary nail done on 4/22, Follow up with [facility] in 2 weeks, WBAT RLE, Lovenox 40 mg SQ daily x 14 days total.PT and OT eval for SNF placement # Anemia most likely due to acute blood loss postop Hb 10.1, remained stable, Lovenox resumed. # Fall at home, initial encounter: PT/OT eval for placement, Fall precaution CK level 113 wnl # Chronic diastolic CHF (congestive heart failure): 2D echo on 12/11/2017 showed EF> 55% with grade 1 diastolic dysfunction.  Patient has mildly elevated BNP 204, but no leg edema or JVD.  No shortness of breath.  CHF is compensated. # Hypertension, S/p Dilt-XR, which has been discontinued due to bradycardia started amlodipine 5 mg p.o. daily. Monitor BP and titrate medications accordingly # Acquired hypothyroidism, On Synthroid Leukocytosis: WBC 12.7, no fever.  So far no source of infection identified, likely reactive. urinalysis negative.  On 4/27 WBC 9.3 WNL, but again WBC count increased to 15 K, repeat CBC after 1 week to check hemoglobin and WBC count. # Right rib fracture: Chest x-ray showed subacute right lateral 9th and 10th rib fractures.  Continue incentive spirometry as needed, and Pain control #  Acute metabolic encephalopathy, urinalysis negative.  Supportive care # Dementia, continue Fall precaution, Namenda, Exelon # Depression and anxiety, Continue home medications # Protein-calorie malnutrition, severe: Body weight 40 kg, BMI 17.22 Continue Ensure supp. Nutrition consult, started Megace 400 mg pod, appetite stimulant. #  Hypokalemia, potassium repleted. Resolved  # Hypophosphatemia, Phos repleted. Resolved  # Vitamin D deficiency: started vitamin D 50,000 units p.o. weekly, follow with PCP to repeat vitamin D level after 3 to 6 months.   Body mass index is 20.23 kg/m.  Nutrition Problem: Severe Malnutrition Etiology: chronic illness (dementia, CHF) Nutrition Interventions: Interventions: Ensure Enlive (each supplement provides 350kcal and 20 grams of protein), MVI     Pain control  - [place] Controlled Substance Reporting System database could not be reviewed as website was not working. Tramadol was prescribed by orthopedic surgery. - Patient was instructed, not to drive, operate heavy machinery, perform activities at heights, swimming or participation in water activities or provide baby sitting services while on Pain, Sleep and Anxiety Medications; until her outpatient Physician has advised to do so again.  - Also recommended to not to take more than prescribed Pain, Sleep and Anxiety Medications.  Patient was seen by physical therapy, who recommended Therapy, SNF placement, which was arranged. On the day of the discharge the patient's vitals were stable, and no other acute medical condition were reported by patient. the patient was felt safe to be discharge at [facility].  Consultants: Orthopedic surgery Procedures: S/p  Right Hip Intramedullary nail done on 4/22,  Discharge Exam: General: Appear in no distress, no Rash; Oral Mucosa Clear, moist. Cardiovascular: S1 and S2 Present, no Murmur, Respiratory: normal respiratory effort, Bilateral Air entry present and no Crackles, no wheezes Abdomen: Bowel Sound present, Soft and no tenderness, no hernia Extremities: no Pedal edema, no calf tenderness, right hip dressing CDI Neurology: alert and oriented to time, place, and person affect appropriate.  Filed Weights   05/22/22 1648 05/23/22 1104  Weight: 43.9 kg 43.9 kg   Vitals:   06/01/22 0011  06/01/22 0751  BP: 115/69 125/69  Pulse: 68 88  Resp: 15 16  Temp:  98.4 F (36.9 C)  SpO2:  98%    DISCHARGE MEDICATION: Allergies as of 06/01/2022       Reactions   Atorvastatin Cough        Medication List     STOP taking these medications    acetaminophen-codeine 300-30 MG tablet Commonly known as: TYLENOL #3   Dilt-XR 120 MG 24 hr capsule Generic drug: diltiazem   gabapentin 100 MG capsule Commonly known as: NEURONTIN   oxyCODONE 5 MG immediate release tablet Commonly known as: Oxy IR/ROXICODONE       TAKE these medications    acetaminophen 325 MG tablet Commonly known as: TYLENOL Take 1-2 tablets (325-650 mg total) by mouth every 6 (six) hours as needed for mild pain, moderate pain, fever or headache.   ALPRAZolam 0.25 MG tablet Commonly known as: XANAX Take 1 tablet (0.25 mg total) by mouth 2 (two) times daily as needed for anxiety.   amLODipine 5 MG tablet Commonly known as: NORVASC Take 1 tablet (5 mg total) by mouth daily. Start taking on: Jun 02, 2022   enoxaparin 30 MG/0.3ML injection Commonly known as: LOVENOX Inject 0.3 mLs (30 mg total) into the skin every evening for 14 days.   feeding supplement Liqd Take 237 mLs by mouth 3 (three) times daily between meals.   levothyroxine 88 MCG tablet Commonly known  as: SYNTHROID Take 88 mcg by mouth daily.   megestrol 400 MG/10ML suspension Commonly known as: MEGACE Take 10 mLs (400 mg total) by mouth daily. Start taking on: Jun 02, 2022   memantine 5 MG tablet Commonly known as: NAMENDA Take 5 mg by mouth 2 (two) times daily.   multivitamin with minerals Tabs tablet Take 1 tablet by mouth daily. Start taking on: Jun 02, 2022   omeprazole 20 MG capsule Commonly known as: PRILOSEC Take 20 mg by mouth daily.   polyethylene glycol 17 g packet Commonly known as: MIRALAX / GLYCOLAX Take 17 g by mouth daily as needed for mild constipation or moderate constipation.   QUEtiapine 25 MG  tablet Commonly known as: SEROQUEL Take 25 mg by mouth at bedtime.   rivastigmine 3 MG capsule Commonly known as: EXELON Take 3 mg by mouth 2 (two) times daily.   senna-docusate 8.6-50 MG tablet Commonly known as: Senokot-S Take 1 tablet by mouth 2 (two) times daily.   sertraline 25 MG tablet Commonly known as: ZOLOFT Take 25 mg by mouth daily.   traMADol 50 MG tablet Commonly known as: ULTRAM Take 1 tablet (50 mg total) by mouth every 12 (twelve) hours as needed for moderate pain.   vitamin B-12 250 MCG tablet Commonly known as: CYANOCOBALAMIN Take 1,000 mcg by mouth daily.   Vitamin D (Ergocalciferol) 1.25 MG (50000 UNIT) Caps capsule Commonly known as: DRISDOL Take 1 capsule (50,000 Units total) by mouth every 7 (seven) days. Start taking on: Jun 07, 2022       Allergies  Allergen Reactions   Atorvastatin Cough   Discharge Instructions     Call MD for:  extreme fatigue   Complete by: As directed    Call MD for:  persistant dizziness or light-headedness   Complete by: As directed    Call MD for:  severe uncontrolled pain   Complete by: As directed    Call MD for:  temperature >100.4   Complete by: As directed    Diet - low sodium heart healthy   Complete by: As directed    Discharge instructions   Complete by: As directed    Follow-up with PCP, patient is to be seen by an MD in 1 to 2 days at the facility for pain management Follow-up with orthopedic surgery in 1 to 2 weeks for postop check.   Increase activity slowly   Complete by: As directed        The results of significant diagnostics from this hospitalization (including imaging, microbiology, ancillary and laboratory) are listed below for reference.    Significant Diagnostic Studies: DG HIP UNILAT WITH PELVIS 2-3 VIEWS RIGHT  Result Date: 05/23/2022 CLINICAL DATA:  Right IM nail fixation EXAM: DG HIP (WITH OR WITHOUT PELVIS) 3V RIGHT COMPARISON:  05/22/2022 FINDINGS: Six fluoroscopic images are  obtained during the performance of the procedure and are provided for interpretation only. Images demonstrate intramedullary nail fixation of a previously noted intertrochanteric fracture Fluoroscopy time: 1 minute 27 seconds Cumulative air kerma: 10.84 mGy IMPRESSION: Fluoroscopic images obtained during intramedullary nail fixation of right intertrochanteric fracture. Electronically Signed   By: Wiliam Ke M.D.   On: 05/23/2022 13:38   DG C-Arm 1-60 Min-No Report  Result Date: 05/23/2022 Fluoroscopy was utilized by the requesting physician.  No radiographic interpretation.   DG Femur Min 2 Views Right  Result Date: 05/22/2022 CLINICAL DATA:  Right hip fracture. EXAM: RIGHT FEMUR 2 VIEWS COMPARISON:  None Available. FINDINGS: No  femoral shaft fracture.  The knee joint is maintained. IMPRESSION: No femoral shaft fracture. Electronically Signed   By: Rudie Meyer M.D.   On: 05/22/2022 14:41   CT Cervical Spine Wo Contrast  Result Date: 05/22/2022 CLINICAL DATA:  Fall from wheelchair.  Hip fracture. EXAM: CT CERVICAL SPINE WITHOUT CONTRAST TECHNIQUE: Multidetector CT imaging of the cervical spine was performed without intravenous contrast. Multiplanar CT image reconstructions were also generated. RADIATION DOSE REDUCTION: This exam was performed according to the departmental dose-optimization program which includes automated exposure control, adjustment of the mA and/or kV according to patient size and/or use of iterative reconstruction technique. COMPARISON:  CT cervical spine 12/13/2021 FINDINGS: Alignment: Grade 1 degenerative anterolisthesis at C3-4 is stable. No other significant listhesis or change is present. Straightening of the normal cervical lordosis is present. Skull base and vertebrae: The craniocervical junction within normal limits. The vertebral body heights are normal. No acute fractures are present. Soft tissues and spinal canal: No prevertebral fluid or swelling. No visible canal  hematoma. Disc levels: Facet and uncovertebral spurring contribute to moderate foraminal stenosis bilaterally at C3-4 and C4-5. Uncovertebral spurring leads to left greater than right foraminal narrowing at C5-6 and C6-7. Upper chest: The lung apices are clear. The thoracic inlet is within limits. IMPRESSION: 1. No acute fracture or traumatic subluxation. 2. Stable multilevel degenerative changes of the cervical spine as described. Electronically Signed   By: Marin Roberts M.D.   On: 05/22/2022 14:06   CT Head Wo Contrast  Result Date: 05/22/2022 CLINICAL DATA:  Fall from wheelchair. Intratrochanteric fracture. Increased confusion recently. EXAM: CT HEAD WITHOUT CONTRAST TECHNIQUE: Contiguous axial images were obtained from the base of the skull through the vertex without intravenous contrast. RADIATION DOSE REDUCTION: This exam was performed according to the departmental dose-optimization program which includes automated exposure control, adjustment of the mA and/or kV according to patient size and/or use of iterative reconstruction technique. COMPARISON:  CT head without contrast 12/13/2021 FINDINGS: Brain: Moderate atrophy and white matter changes are stable. The patient scratched at the study is mildly degraded by patient motion. Remote ischemic changes are noted in the thalami. Basal ganglia are intact. No acute cortical abnormalities are present. No acute hemorrhage or mass lesion is present. The ventricles are proportionate to the degree of atrophy. No significant extraaxial fluid collection is present. The brainstem and cerebellum are within normal limits. Midline structures are within normal limits. Vascular: Atherosclerotic calcifications are present within the cavernous internal carotid arteries bilaterally. No hyperdense vessel is present. Skull: Calvarium is intact. No focal lytic or blastic lesions are present. No significant extracranial soft tissue lesion is present. Sinuses/Orbits:  Chronic opacification of the anterior right paranasal sinuses is again noted. There is minimal gas in the right frontal sinus. The paranasal sinuses and mastoid air cells are otherwise clear. Bilateral lens replacements are noted. Globes and orbits are otherwise unremarkable. IMPRESSION: 1. No acute intracranial abnormality or significant interval change. No acute trauma. 2. Stable atrophy and white matter disease. This likely reflects the sequela of chronic microvascular ischemia. Electronically Signed   By: Marin Roberts M.D.   On: 05/22/2022 14:00   DG Chest 1 View  Result Date: 05/22/2022 CLINICAL DATA:  Hip fracture EXAM: CHEST  1 VIEW COMPARISON:  03/26/2021 FINDINGS: The heart size and mediastinal contours are stable. Both lungs are clear. Fractures of the lateral right ninth and tenth ribs may be subacute. Chronic left rib deformities. Bones are demineralized. IMPRESSION: 1. No acute cardiopulmonary findings. 2.  Fractures of the lateral right ninth and tenth ribs may be subacute. Correlate with point tenderness. Electronically Signed   By: Duanne Guess D.O.   On: 05/22/2022 13:41   DG Hip Unilat W or Wo Pelvis 2-3 Views Right  Result Date: 05/22/2022 CLINICAL DATA:  Fall, right leg shortening EXAM: DG HIP (WITH OR WITHOUT PELVIS) 2-3V RIGHT COMPARISON:  12/13/2021 FINDINGS: Acute comminuted intertrochanteric fracture of the proximal right femur with varus angulation. Medial displacement of the lesser trochanteric fragment. Hip joint alignment is maintained without dislocation. Bones are demineralized. Prior left hip arthroplasty. No evidence of pelvic fracture or diastasis. IMPRESSION: Acute comminuted intertrochanteric fracture of the proximal right femur with varus angulation. Electronically Signed   By: Duanne Guess D.O.   On: 05/22/2022 13:39    Microbiology: Recent Results (from the past 240 hour(s))  Surgical pcr screen     Status: None   Collection Time: 05/23/22 10:34 AM    Specimen: Nasal Mucosa; Nasal Swab  Result Value Ref Range Status   MRSA, PCR NEGATIVE NEGATIVE Final   Staphylococcus aureus NEGATIVE NEGATIVE Final    Comment: (NOTE) The Xpert SA Assay (FDA approved for NASAL specimens in patients 27 years of age and older), is one component of a comprehensive surveillance program. It is not intended to diagnose infection nor to guide or monitor treatment. Performed at [facility], [address] [place], [place] [address]      Labs: CBC: Recent Labs  Lab 05/26/22 0423 05/27/22 0514 05/28/22 0538 05/30/22 0656 05/31/22 0455  WBC 12.7* 13.1* 9.3 16.3* 15.0*  HGB 9.7* 10.1* 9.3* 10.2* 10.1*  HCT 27.5* 28.9* 26.5* 29.5* 28.6*  MCV 84.4 84.3 84.1 83.8 83.4  PLT 279 312 355 468* 492*   Basic Metabolic Panel: Recent Labs  Lab 05/26/22 0423 05/27/22 0514 05/28/22 0538 05/30/22 0656  NA 134* 136 136 137  K 3.6 3.3* 4.0 4.8  CL 100 100 103 100  CO2 27 26 26 28   GLUCOSE 123* 122* 94 144*  BUN 24* 18 22 25*  CREATININE 0.83 0.60 0.75 0.74  CALCIUM 8.4* 8.3* 8.7* 9.3  MG 2.1 1.9 2.1 2.1  PHOS 1.8* 2.9 3.9 3.2   Liver Function Tests: No results for input(s): "AST", "ALT", "ALKPHOS", "BILITOT", "PROT", "ALBUMIN" in the last 168 hours. No results for input(s): "LIPASE", "AMYLASE" in the last 168 hours. No results for input(s): "AMMONIA" in the last 168 hours. Cardiac Enzymes: No results for input(s): "CKTOTAL", "CKMB", "CKMBINDEX", "TROPONINI" in the last 168 hours. BNP (last 3 results) Recent Labs    05/22/22 1305  BNP 204.5*   CBG: No results for input(s): "GLUCAP" in the last 168 hours.  Time spent: 35 minutes  Signed:  Gillis Santa  [facility] 06/01/2022 11:45 AM

## 2022-06-02 DIAGNOSIS — K5900 Constipation, unspecified: Secondary | ICD-10-CM | POA: Diagnosis not present

## 2022-06-02 DIAGNOSIS — E43 Unspecified severe protein-calorie malnutrition: Secondary | ICD-10-CM | POA: Diagnosis not present

## 2022-06-02 DIAGNOSIS — I10 Essential (primary) hypertension: Secondary | ICD-10-CM | POA: Diagnosis not present

## 2022-06-02 DIAGNOSIS — Z96642 Presence of left artificial hip joint: Secondary | ICD-10-CM | POA: Diagnosis not present

## 2022-06-02 DIAGNOSIS — F02B3 Dementia in other diseases classified elsewhere, moderate, with mood disturbance: Secondary | ICD-10-CM | POA: Diagnosis not present

## 2022-06-02 DIAGNOSIS — F03A18 Unspecified dementia, mild, with other behavioral disturbance: Secondary | ICD-10-CM | POA: Diagnosis not present

## 2022-06-02 DIAGNOSIS — I739 Peripheral vascular disease, unspecified: Secondary | ICD-10-CM | POA: Diagnosis not present

## 2022-06-02 DIAGNOSIS — W19XXXA Unspecified fall, initial encounter: Secondary | ICD-10-CM | POA: Diagnosis not present

## 2022-06-02 DIAGNOSIS — F32A Depression, unspecified: Secondary | ICD-10-CM | POA: Diagnosis not present

## 2022-06-02 DIAGNOSIS — S2241XD Multiple fractures of ribs, right side, subsequent encounter for fracture with routine healing: Secondary | ICD-10-CM | POA: Diagnosis not present

## 2022-06-02 DIAGNOSIS — I110 Hypertensive heart disease with heart failure: Secondary | ICD-10-CM | POA: Diagnosis not present

## 2022-06-02 DIAGNOSIS — G301 Alzheimer's disease with late onset: Secondary | ICD-10-CM | POA: Diagnosis not present

## 2022-06-02 DIAGNOSIS — K219 Gastro-esophageal reflux disease without esophagitis: Secondary | ICD-10-CM | POA: Diagnosis not present

## 2022-06-02 DIAGNOSIS — D62 Acute posthemorrhagic anemia: Secondary | ICD-10-CM | POA: Diagnosis not present

## 2022-06-02 DIAGNOSIS — W19XXXD Unspecified fall, subsequent encounter: Secondary | ICD-10-CM | POA: Diagnosis not present

## 2022-06-02 DIAGNOSIS — E538 Deficiency of other specified B group vitamins: Secondary | ICD-10-CM | POA: Diagnosis not present

## 2022-06-02 DIAGNOSIS — Z853 Personal history of malignant neoplasm of breast: Secondary | ICD-10-CM | POA: Diagnosis not present

## 2022-06-02 DIAGNOSIS — F331 Major depressive disorder, recurrent, moderate: Secondary | ICD-10-CM | POA: Diagnosis not present

## 2022-06-02 DIAGNOSIS — E039 Hypothyroidism, unspecified: Secondary | ICD-10-CM | POA: Diagnosis not present

## 2022-06-02 DIAGNOSIS — S72141D Displaced intertrochanteric fracture of right femur, subsequent encounter for closed fracture with routine healing: Secondary | ICD-10-CM | POA: Diagnosis not present

## 2022-06-02 DIAGNOSIS — B351 Tinea unguium: Secondary | ICD-10-CM | POA: Diagnosis not present

## 2022-06-02 DIAGNOSIS — Z7901 Long term (current) use of anticoagulants: Secondary | ICD-10-CM | POA: Diagnosis not present

## 2022-06-02 DIAGNOSIS — L603 Nail dystrophy: Secondary | ICD-10-CM | POA: Diagnosis not present

## 2022-06-02 DIAGNOSIS — G9341 Metabolic encephalopathy: Secondary | ICD-10-CM | POA: Diagnosis not present

## 2022-06-02 DIAGNOSIS — F418 Other specified anxiety disorders: Secondary | ICD-10-CM | POA: Diagnosis not present

## 2022-06-02 DIAGNOSIS — D72829 Elevated white blood cell count, unspecified: Secondary | ICD-10-CM | POA: Diagnosis not present

## 2022-06-02 DIAGNOSIS — I5032 Chronic diastolic (congestive) heart failure: Secondary | ICD-10-CM | POA: Diagnosis not present

## 2022-06-02 DIAGNOSIS — I5043 Acute on chronic combined systolic (congestive) and diastolic (congestive) heart failure: Secondary | ICD-10-CM | POA: Diagnosis not present

## 2022-06-03 DIAGNOSIS — F03A18 Unspecified dementia, mild, with other behavioral disturbance: Secondary | ICD-10-CM | POA: Diagnosis not present

## 2022-06-03 DIAGNOSIS — E039 Hypothyroidism, unspecified: Secondary | ICD-10-CM | POA: Diagnosis not present

## 2022-06-03 DIAGNOSIS — S72141D Displaced intertrochanteric fracture of right femur, subsequent encounter for closed fracture with routine healing: Secondary | ICD-10-CM | POA: Diagnosis not present

## 2022-06-03 DIAGNOSIS — S2241XD Multiple fractures of ribs, right side, subsequent encounter for fracture with routine healing: Secondary | ICD-10-CM | POA: Diagnosis not present

## 2022-06-03 DIAGNOSIS — I5032 Chronic diastolic (congestive) heart failure: Secondary | ICD-10-CM | POA: Diagnosis not present

## 2022-06-03 DIAGNOSIS — I110 Hypertensive heart disease with heart failure: Secondary | ICD-10-CM | POA: Diagnosis not present

## 2022-06-03 DIAGNOSIS — Z7901 Long term (current) use of anticoagulants: Secondary | ICD-10-CM | POA: Diagnosis not present

## 2022-06-03 DIAGNOSIS — F418 Other specified anxiety disorders: Secondary | ICD-10-CM | POA: Diagnosis not present

## 2022-06-03 DIAGNOSIS — K219 Gastro-esophageal reflux disease without esophagitis: Secondary | ICD-10-CM | POA: Diagnosis not present

## 2022-06-03 DIAGNOSIS — G9341 Metabolic encephalopathy: Secondary | ICD-10-CM | POA: Diagnosis not present

## 2022-06-03 DIAGNOSIS — W19XXXD Unspecified fall, subsequent encounter: Secondary | ICD-10-CM | POA: Diagnosis not present

## 2022-06-03 DIAGNOSIS — E43 Unspecified severe protein-calorie malnutrition: Secondary | ICD-10-CM | POA: Diagnosis not present

## 2022-06-06 DIAGNOSIS — F03A18 Unspecified dementia, mild, with other behavioral disturbance: Secondary | ICD-10-CM | POA: Diagnosis not present

## 2022-06-06 DIAGNOSIS — W19XXXA Unspecified fall, initial encounter: Secondary | ICD-10-CM | POA: Diagnosis not present

## 2022-06-06 DIAGNOSIS — I110 Hypertensive heart disease with heart failure: Secondary | ICD-10-CM | POA: Diagnosis not present

## 2022-06-06 DIAGNOSIS — S72141D Displaced intertrochanteric fracture of right femur, subsequent encounter for closed fracture with routine healing: Secondary | ICD-10-CM | POA: Diagnosis not present

## 2022-06-06 DIAGNOSIS — Z7901 Long term (current) use of anticoagulants: Secondary | ICD-10-CM | POA: Diagnosis not present

## 2022-06-06 DIAGNOSIS — K5900 Constipation, unspecified: Secondary | ICD-10-CM | POA: Diagnosis not present

## 2022-06-06 DIAGNOSIS — K219 Gastro-esophageal reflux disease without esophagitis: Secondary | ICD-10-CM | POA: Diagnosis not present

## 2022-06-07 DIAGNOSIS — F32A Depression, unspecified: Secondary | ICD-10-CM | POA: Diagnosis not present

## 2022-06-07 DIAGNOSIS — D62 Acute posthemorrhagic anemia: Secondary | ICD-10-CM | POA: Diagnosis not present

## 2022-06-07 DIAGNOSIS — E039 Hypothyroidism, unspecified: Secondary | ICD-10-CM | POA: Diagnosis not present

## 2022-06-07 DIAGNOSIS — F03A18 Unspecified dementia, mild, with other behavioral disturbance: Secondary | ICD-10-CM | POA: Diagnosis not present

## 2022-06-07 DIAGNOSIS — S2241XD Multiple fractures of ribs, right side, subsequent encounter for fracture with routine healing: Secondary | ICD-10-CM | POA: Diagnosis not present

## 2022-06-07 DIAGNOSIS — I110 Hypertensive heart disease with heart failure: Secondary | ICD-10-CM | POA: Diagnosis not present

## 2022-06-07 DIAGNOSIS — S72141D Displaced intertrochanteric fracture of right femur, subsequent encounter for closed fracture with routine healing: Secondary | ICD-10-CM | POA: Diagnosis not present

## 2022-06-07 DIAGNOSIS — K219 Gastro-esophageal reflux disease without esophagitis: Secondary | ICD-10-CM | POA: Diagnosis not present

## 2022-06-07 DIAGNOSIS — I5032 Chronic diastolic (congestive) heart failure: Secondary | ICD-10-CM | POA: Diagnosis not present

## 2022-06-07 DIAGNOSIS — D72829 Elevated white blood cell count, unspecified: Secondary | ICD-10-CM | POA: Diagnosis not present

## 2022-06-07 DIAGNOSIS — W19XXXD Unspecified fall, subsequent encounter: Secondary | ICD-10-CM | POA: Diagnosis not present

## 2022-06-07 DIAGNOSIS — G9341 Metabolic encephalopathy: Secondary | ICD-10-CM | POA: Diagnosis not present

## 2022-06-08 DIAGNOSIS — E43 Unspecified severe protein-calorie malnutrition: Secondary | ICD-10-CM | POA: Diagnosis not present

## 2022-06-08 DIAGNOSIS — S72141D Displaced intertrochanteric fracture of right femur, subsequent encounter for closed fracture with routine healing: Secondary | ICD-10-CM | POA: Diagnosis not present

## 2022-06-08 DIAGNOSIS — F03A18 Unspecified dementia, mild, with other behavioral disturbance: Secondary | ICD-10-CM | POA: Diagnosis not present

## 2022-06-08 DIAGNOSIS — I5032 Chronic diastolic (congestive) heart failure: Secondary | ICD-10-CM | POA: Diagnosis not present

## 2022-06-10 DIAGNOSIS — S72141D Displaced intertrochanteric fracture of right femur, subsequent encounter for closed fracture with routine healing: Secondary | ICD-10-CM | POA: Diagnosis not present

## 2022-06-10 DIAGNOSIS — I110 Hypertensive heart disease with heart failure: Secondary | ICD-10-CM | POA: Diagnosis not present

## 2022-06-10 DIAGNOSIS — K5900 Constipation, unspecified: Secondary | ICD-10-CM | POA: Diagnosis not present

## 2022-06-10 DIAGNOSIS — F03A18 Unspecified dementia, mild, with other behavioral disturbance: Secondary | ICD-10-CM | POA: Diagnosis not present

## 2022-06-13 DIAGNOSIS — I110 Hypertensive heart disease with heart failure: Secondary | ICD-10-CM | POA: Diagnosis not present

## 2022-06-13 DIAGNOSIS — E43 Unspecified severe protein-calorie malnutrition: Secondary | ICD-10-CM | POA: Diagnosis not present

## 2022-06-13 DIAGNOSIS — S72141D Displaced intertrochanteric fracture of right femur, subsequent encounter for closed fracture with routine healing: Secondary | ICD-10-CM | POA: Diagnosis not present

## 2022-06-13 DIAGNOSIS — E039 Hypothyroidism, unspecified: Secondary | ICD-10-CM | POA: Diagnosis not present

## 2022-06-13 DIAGNOSIS — I5032 Chronic diastolic (congestive) heart failure: Secondary | ICD-10-CM | POA: Diagnosis not present

## 2022-06-15 DIAGNOSIS — S72141D Displaced intertrochanteric fracture of right femur, subsequent encounter for closed fracture with routine healing: Secondary | ICD-10-CM | POA: Diagnosis not present

## 2022-06-15 DIAGNOSIS — I110 Hypertensive heart disease with heart failure: Secondary | ICD-10-CM | POA: Diagnosis not present

## 2022-06-15 DIAGNOSIS — E039 Hypothyroidism, unspecified: Secondary | ICD-10-CM | POA: Diagnosis not present

## 2022-06-15 DIAGNOSIS — K5900 Constipation, unspecified: Secondary | ICD-10-CM | POA: Diagnosis not present

## 2022-06-22 ENCOUNTER — Emergency Department
Admission: EM | Admit: 2022-06-22 | Discharge: 2022-06-23 | Disposition: A | Discharge status: Skilled Nursing Facility | Payer: PPO | Attending: Emergency Medicine | Admitting: Emergency Medicine

## 2022-06-22 ENCOUNTER — Other Ambulatory Visit: Payer: Self-pay

## 2022-06-22 ENCOUNTER — Emergency Department: Payer: PPO

## 2022-06-22 DIAGNOSIS — M25551 Pain in right hip: Secondary | ICD-10-CM | POA: Insufficient documentation

## 2022-06-22 DIAGNOSIS — I10 Essential (primary) hypertension: Secondary | ICD-10-CM | POA: Diagnosis not present

## 2022-06-22 DIAGNOSIS — W19XXXA Unspecified fall, initial encounter: Secondary | ICD-10-CM | POA: Diagnosis not present

## 2022-06-22 DIAGNOSIS — R296 Repeated falls: Secondary | ICD-10-CM

## 2022-06-22 DIAGNOSIS — F0390 Unspecified dementia without behavioral disturbance: Secondary | ICD-10-CM | POA: Diagnosis not present

## 2022-06-22 DIAGNOSIS — S79911A Unspecified injury of right hip, initial encounter: Secondary | ICD-10-CM | POA: Diagnosis not present

## 2022-06-22 NOTE — ED Triage Notes (Signed)
Pt from home via [facility] after falling out of bed. Denies LOC or hitting head. Denies daily thinners. R leg pain with noted shortening and rotation. Denies numbness. Distal pulse palpable. Cap refill WNL.

## 2022-06-22 NOTE — ED Provider Notes (Signed)
[facility] Provider Note    Event Date/Time   First MD Initiated Contact with Patient 06/22/22 2353     (approximate)   History   Fall and Hip Pain   HPI  Lisa Martin is a 89 y.o. female who presents to the ED for evaluation of Fall and Hip Pain   I reviewed medical DC summary from 5/1.  Came in with a right intertrochanteric hip fracture after a fall and had IMN on 4/22..  Otherwise history of frequent falls, diastolic dysfunction right-sided rib fracture, HTN, dementia.  No anticoagulation  Daughter is patient to the ED for evaluation of another fall.  She reports that patient falls "just about every day."  She reports that she is uncertain why the facility even brought the patient into the ED today because of this fall.  They been waiting for a few hours and reports the patient is at her baseline and has no explicit concerns otherwise.  She will the patient at the facility yesterday and she was "normal" and remains that way tonight.   Physical Exam   Triage Vital Signs: ED Triage Vitals  Enc Vitals Group     BP 06/22/22 2112 (!) 147/83     Pulse Rate 06/22/22 2112 70     Resp 06/22/22 2112 18     Temp 06/22/22 2112 98.5 F (36.9 C)     Temp Source 06/22/22 2112 Oral     SpO2 06/22/22 2112 93 %     Weight 06/22/22 2107 110 lb (49.9 kg)     Height 06/22/22 2107 5\' 2"  (1.575 m)     Head Circumference --      Peak Flow --      Pain Score 06/22/22 2107 8     Pain Loc --      Pain Edu? --      Excl. in GC? --     Most recent vital signs: Vitals:   06/22/22 2112  BP: (!) 147/83  Pulse: 70  Resp: 18  Temp: 98.5 F (36.9 C)  SpO2: 93%    General: Awake, no distress.  Pleasantly disoriented. CV:  Good peripheral perfusion.  Resp:  Normal effort.  Abd:  No distention.  MSK:  No deformity noted.  Multiple healing skin tears of various locations, covered in bandages.  No evidence of superimposed infection.  Palpation of upper extremities  without evidence of deformity, tenderness or trauma.  She is ranging bilateral lower extremities. Neuro:  No focal deficits appreciated. Other:     ED Results / Procedures / Treatments   Labs (all labs ordered are listed, but only abnormal results are displayed) Labs Reviewed - No data to display  EKG   RADIOLOGY CT right hip interpreted by me without evidence of acute fracture.  IM rod in place  Official radiology report(s): CT Hip Right Wo Contrast  Result Date: 06/22/2022 CLINICAL DATA:  Hip pain after fall EXAM: CT OF THE RIGHT HIP WITHOUT CONTRAST TECHNIQUE: Multidetector CT imaging of the right hip was performed according to the standard protocol. Multiplanar CT image reconstructions were also generated. RADIATION DOSE REDUCTION: This exam was performed according to the departmental dose-optimization program which includes automated exposure control, adjustment of the mA and/or kV according to patient size and/or use of iterative reconstruction technique. COMPARISON:  Radiographs 05/22/2022 FINDINGS: Exam is mildly compromised by streak artifact from metallic hardware. Bones/Joint/Cartilage Interval placement of an intramedullary rod and screw across a intertrochanteric fracture. The  fracture line remains visible. Medial displacement of the lesser trochanter. Remote inferior pubic ramus fracture. No definite new acute fracture. No dislocation. Ligaments Suboptimally assessed by CT. Muscles and Tendons No definite acute abnormality. Soft tissues Edema within the subcutaneous gluteal fat. IMPRESSION: 1. IM rod and screw across a intertrochanteric fracture. The fracture line remains visible. No definite new acute fracture. Electronically Signed   By: Minerva Fester M.D.   On: 06/22/2022 21:38    PROCEDURES and INTERVENTIONS:  Procedures  Medications - No data to display   IMPRESSION / MDM / ASSESSMENT AND PLAN / ED COURSE  I reviewed the triage vital signs and the nursing  notes.  Differential diagnosis includes, but is not limited to, seizure, stroke, syncope, cardiac dysrhythmia, fracture, periprosthetic fracture, dislocation  Pleasantly disoriented patient presents after another fall without evidence of acute pathology and suitable for outpatient management.  Imaging is reassuring.  No clinical signs of additional injuries.  Reassuring vital signs and clinical picture.  Suitable for return to facility.      FINAL CLINICAL IMPRESSION(S) / ED DIAGNOSES   Final diagnoses:  Frequent falls     Rx / DC Orders   ED Discharge Orders     None        Note:  This document was prepared using Dragon voice recognition software and may include unintentional dictation errors.   Delton Prairie, MD 06/23/22 [phone]

## 2022-06-23 DIAGNOSIS — Z741 Need for assistance with personal care: Secondary | ICD-10-CM | POA: Diagnosis not present

## 2022-06-23 DIAGNOSIS — M25572 Pain in left ankle and joints of left foot: Secondary | ICD-10-CM | POA: Diagnosis not present

## 2022-06-23 DIAGNOSIS — M25551 Pain in right hip: Secondary | ICD-10-CM | POA: Diagnosis not present

## 2022-06-23 NOTE — ED Notes (Signed)
called to [facility] for transport to facility/[facility]/rep:[email or web address] Kitchen

## 2022-06-23 NOTE — Discharge Instructions (Signed)
Use Tylenol for pain and fevers.  Up to 1000 mg per dose, up to 4 times per day.  Do not take more than 4000 mg of Tylenol/acetaminophen within 24 hours..  

## 2022-06-23 NOTE — ED Notes (Signed)
Called Report to [facility], EMS here to transport patient to SNF

## 2022-06-24 DIAGNOSIS — E43 Unspecified severe protein-calorie malnutrition: Secondary | ICD-10-CM | POA: Diagnosis not present

## 2022-06-24 DIAGNOSIS — K219 Gastro-esophageal reflux disease without esophagitis: Secondary | ICD-10-CM | POA: Diagnosis not present

## 2022-06-24 DIAGNOSIS — W19XXXD Unspecified fall, subsequent encounter: Secondary | ICD-10-CM | POA: Diagnosis not present

## 2022-06-24 DIAGNOSIS — S72141D Displaced intertrochanteric fracture of right femur, subsequent encounter for closed fracture with routine healing: Secondary | ICD-10-CM | POA: Diagnosis not present

## 2022-06-24 DIAGNOSIS — E039 Hypothyroidism, unspecified: Secondary | ICD-10-CM | POA: Diagnosis not present

## 2022-06-24 DIAGNOSIS — I110 Hypertensive heart disease with heart failure: Secondary | ICD-10-CM | POA: Diagnosis not present

## 2022-07-04 DIAGNOSIS — E039 Hypothyroidism, unspecified: Secondary | ICD-10-CM | POA: Diagnosis not present

## 2022-07-04 DIAGNOSIS — I110 Hypertensive heart disease with heart failure: Secondary | ICD-10-CM | POA: Diagnosis not present

## 2022-07-04 DIAGNOSIS — E43 Unspecified severe protein-calorie malnutrition: Secondary | ICD-10-CM | POA: Diagnosis not present

## 2022-07-04 DIAGNOSIS — F03A18 Unspecified dementia, mild, with other behavioral disturbance: Secondary | ICD-10-CM | POA: Diagnosis not present

## 2022-07-08 DIAGNOSIS — F02B3 Dementia in other diseases classified elsewhere, moderate, with mood disturbance: Secondary | ICD-10-CM | POA: Diagnosis not present

## 2022-07-08 DIAGNOSIS — F331 Major depressive disorder, recurrent, moderate: Secondary | ICD-10-CM | POA: Diagnosis not present

## 2022-07-08 DIAGNOSIS — G301 Alzheimer's disease with late onset: Secondary | ICD-10-CM | POA: Diagnosis not present

## 2022-07-12 DIAGNOSIS — M6281 Muscle weakness (generalized): Secondary | ICD-10-CM | POA: Diagnosis not present

## 2022-07-12 DIAGNOSIS — K219 Gastro-esophageal reflux disease without esophagitis: Secondary | ICD-10-CM | POA: Diagnosis not present

## 2022-07-12 DIAGNOSIS — I5030 Unspecified diastolic (congestive) heart failure: Secondary | ICD-10-CM | POA: Diagnosis not present

## 2022-07-12 DIAGNOSIS — I10 Essential (primary) hypertension: Secondary | ICD-10-CM | POA: Diagnosis not present

## 2022-07-12 DIAGNOSIS — E039 Hypothyroidism, unspecified: Secondary | ICD-10-CM | POA: Diagnosis not present

## 2022-07-14 ENCOUNTER — Emergency Department: Payer: PPO

## 2022-07-14 ENCOUNTER — Encounter: Payer: Self-pay | Admitting: Emergency Medicine

## 2022-07-14 ENCOUNTER — Other Ambulatory Visit: Payer: Self-pay

## 2022-07-14 ENCOUNTER — Inpatient Hospital Stay
Admission: EM | Admit: 2022-07-14 | Discharge: 2022-07-19 | DRG: 536 | Disposition: A | Discharge status: Skilled Nursing Facility | Payer: PPO | Source: Skilled Nursing Facility | Attending: Internal Medicine | Admitting: Internal Medicine

## 2022-07-14 DIAGNOSIS — W19XXXA Unspecified fall, initial encounter: Secondary | ICD-10-CM | POA: Diagnosis not present

## 2022-07-14 DIAGNOSIS — D72829 Elevated white blood cell count, unspecified: Secondary | ICD-10-CM | POA: Diagnosis present

## 2022-07-14 DIAGNOSIS — S72001A Fracture of unspecified part of neck of right femur, initial encounter for closed fracture: Secondary | ICD-10-CM | POA: Diagnosis not present

## 2022-07-14 DIAGNOSIS — S32401A Unspecified fracture of right acetabulum, initial encounter for closed fracture: Secondary | ICD-10-CM | POA: Diagnosis not present

## 2022-07-14 DIAGNOSIS — S32311A Displaced avulsion fracture of right ilium, initial encounter for closed fracture: Secondary | ICD-10-CM | POA: Diagnosis not present

## 2022-07-14 DIAGNOSIS — M25551 Pain in right hip: Secondary | ICD-10-CM | POA: Diagnosis not present

## 2022-07-14 DIAGNOSIS — Z923 Personal history of irradiation: Secondary | ICD-10-CM

## 2022-07-14 DIAGNOSIS — Z955 Presence of coronary angioplasty implant and graft: Secondary | ICD-10-CM

## 2022-07-14 DIAGNOSIS — Z9221 Personal history of antineoplastic chemotherapy: Secondary | ICD-10-CM

## 2022-07-14 DIAGNOSIS — R296 Repeated falls: Secondary | ICD-10-CM | POA: Diagnosis not present

## 2022-07-14 DIAGNOSIS — I10 Essential (primary) hypertension: Secondary | ICD-10-CM | POA: Diagnosis not present

## 2022-07-14 DIAGNOSIS — W1830XA Fall on same level, unspecified, initial encounter: Secondary | ICD-10-CM | POA: Diagnosis present

## 2022-07-14 DIAGNOSIS — Z79899 Other long term (current) drug therapy: Secondary | ICD-10-CM

## 2022-07-14 DIAGNOSIS — F32A Depression, unspecified: Secondary | ICD-10-CM | POA: Diagnosis present

## 2022-07-14 DIAGNOSIS — Y92009 Unspecified place in unspecified non-institutional (private) residence as the place of occurrence of the external cause: Secondary | ICD-10-CM

## 2022-07-14 DIAGNOSIS — F0283 Dementia in other diseases classified elsewhere, unspecified severity, with mood disturbance: Secondary | ICD-10-CM | POA: Diagnosis present

## 2022-07-14 DIAGNOSIS — Z66 Do not resuscitate: Secondary | ICD-10-CM | POA: Diagnosis present

## 2022-07-14 DIAGNOSIS — Y92129 Unspecified place in nursing home as the place of occurrence of the external cause: Secondary | ICD-10-CM

## 2022-07-14 DIAGNOSIS — Z853 Personal history of malignant neoplasm of breast: Secondary | ICD-10-CM

## 2022-07-14 DIAGNOSIS — Z043 Encounter for examination and observation following other accident: Secondary | ICD-10-CM | POA: Diagnosis not present

## 2022-07-14 DIAGNOSIS — Z96642 Presence of left artificial hip joint: Secondary | ICD-10-CM | POA: Diagnosis present

## 2022-07-14 DIAGNOSIS — E039 Hypothyroidism, unspecified: Secondary | ICD-10-CM | POA: Diagnosis present

## 2022-07-14 DIAGNOSIS — Z79890 Hormone replacement therapy (postmenopausal): Secondary | ICD-10-CM

## 2022-07-14 DIAGNOSIS — S32471A Displaced fracture of medial wall of right acetabulum, initial encounter for closed fracture: Principal | ICD-10-CM | POA: Diagnosis present

## 2022-07-14 DIAGNOSIS — S72141A Displaced intertrochanteric fracture of right femur, initial encounter for closed fracture: Secondary | ICD-10-CM | POA: Diagnosis not present

## 2022-07-14 DIAGNOSIS — R0689 Other abnormalities of breathing: Secondary | ICD-10-CM | POA: Diagnosis not present

## 2022-07-14 DIAGNOSIS — N3090 Cystitis, unspecified without hematuria: Secondary | ICD-10-CM | POA: Diagnosis present

## 2022-07-14 DIAGNOSIS — G309 Alzheimer's disease, unspecified: Secondary | ICD-10-CM | POA: Diagnosis present

## 2022-07-14 DIAGNOSIS — S329XXA Fracture of unspecified parts of lumbosacral spine and pelvis, initial encounter for closed fracture: Secondary | ICD-10-CM | POA: Insufficient documentation

## 2022-07-14 DIAGNOSIS — Z87891 Personal history of nicotine dependence: Secondary | ICD-10-CM

## 2022-07-14 DIAGNOSIS — B9620 Unspecified Escherichia coli [E. coli] as the cause of diseases classified elsewhere: Secondary | ICD-10-CM | POA: Diagnosis present

## 2022-07-14 DIAGNOSIS — Z888 Allergy status to other drugs, medicaments and biological substances status: Secondary | ICD-10-CM

## 2022-07-14 LAB — CBC WITH DIFFERENTIAL/PLATELET
Abs Immature Granulocytes: 0.35 10*3/uL — ABNORMAL HIGH (ref 0.00–0.07)
Basophils Absolute: 0.1 10*3/uL (ref 0.0–0.1)
Basophils Relative: 0 %
Eosinophils Absolute: 0.2 10*3/uL (ref 0.0–0.5)
Eosinophils Relative: 2 %
HCT: 32.3 % — ABNORMAL LOW (ref 36.0–46.0)
Hemoglobin: 10.8 g/dL — ABNORMAL LOW (ref 12.0–15.0)
Immature Granulocytes: 3 %
Lymphocytes Relative: 9 %
Lymphs Abs: 1.2 10*3/uL (ref 0.7–4.0)
MCH: 28.9 pg (ref 26.0–34.0)
MCHC: 33.4 g/dL (ref 30.0–36.0)
MCV: 86.4 fL (ref 80.0–100.0)
Monocytes Absolute: 0.8 10*3/uL (ref 0.1–1.0)
Monocytes Relative: 6 %
Neutro Abs: 11.1 10*3/uL — ABNORMAL HIGH (ref 1.7–7.7)
Neutrophils Relative %: 80 %
Platelets: 472 10*3/uL — ABNORMAL HIGH (ref 150–400)
RBC: 3.74 MIL/uL — ABNORMAL LOW (ref 3.87–5.11)
RDW: 18 % — ABNORMAL HIGH (ref 11.5–15.5)
WBC: 13.8 10*3/uL — ABNORMAL HIGH (ref 4.0–10.5)
nRBC: 0 % (ref 0.0–0.2)

## 2022-07-14 LAB — CBC
HCT: 30.9 % — ABNORMAL LOW (ref 36.0–46.0)
HCT: 32.1 % — ABNORMAL LOW (ref 36.0–46.0)
Hemoglobin: 10.7 g/dL — ABNORMAL LOW (ref 12.0–15.0)
Hemoglobin: 10.8 g/dL — ABNORMAL LOW (ref 12.0–15.0)
MCH: 28.6 pg (ref 26.0–34.0)
MCH: 28.7 pg (ref 26.0–34.0)
MCHC: 33.6 g/dL (ref 30.0–36.0)
MCHC: 34.6 g/dL (ref 30.0–36.0)
MCV: 82.8 fL (ref 80.0–100.0)
MCV: 84.9 fL (ref 80.0–100.0)
Platelets: 397 10*3/uL (ref 150–400)
Platelets: 460 10*3/uL — ABNORMAL HIGH (ref 150–400)
RBC: 3.73 MIL/uL — ABNORMAL LOW (ref 3.87–5.11)
RBC: 3.78 MIL/uL — ABNORMAL LOW (ref 3.87–5.11)
RDW: 17.2 % — ABNORMAL HIGH (ref 11.5–15.5)
RDW: 17.6 % — ABNORMAL HIGH (ref 11.5–15.5)
WBC: 13.7 10*3/uL — ABNORMAL HIGH (ref 4.0–10.5)
WBC: 17.4 10*3/uL — ABNORMAL HIGH (ref 4.0–10.5)
nRBC: 0 % (ref 0.0–0.2)
nRBC: 0 % (ref 0.0–0.2)

## 2022-07-14 LAB — HEPATIC FUNCTION PANEL
ALT: 19 U/L (ref 0–44)
AST: 23 U/L (ref 15–41)
Albumin: 3.2 g/dL — ABNORMAL LOW (ref 3.5–5.0)
Alkaline Phosphatase: 120 U/L (ref 38–126)
Bilirubin, Direct: 0.1 mg/dL (ref 0.0–0.2)
Indirect Bilirubin: 0.6 mg/dL (ref 0.3–0.9)
Total Bilirubin: 0.7 mg/dL (ref 0.3–1.2)
Total Protein: 7.2 g/dL (ref 6.5–8.1)

## 2022-07-14 LAB — BASIC METABOLIC PANEL
Anion gap: 10 (ref 5–15)
BUN: 23 mg/dL (ref 8–23)
CO2: 20 mmol/L — ABNORMAL LOW (ref 22–32)
Calcium: 8.6 mg/dL — ABNORMAL LOW (ref 8.9–10.3)
Chloride: 107 mmol/L (ref 98–111)
Creatinine, Ser: 0.9 mg/dL (ref 0.44–1.00)
GFR, Estimated: >60 mL/min (ref >60)
Glucose, Bld: 156 mg/dL — ABNORMAL HIGH (ref 70–99)
Potassium: 4.2 mmol/L (ref 3.5–5.1)
Sodium: 137 mmol/L (ref 135–145)

## 2022-07-14 MED ORDER — PANTOPRAZOLE SODIUM 20 MG PO TBEC
20.0000 mg | DELAYED_RELEASE_TABLET | Freq: Every day | ORAL | Status: DC
  Filled 2022-07-14: qty 1

## 2022-07-14 MED ORDER — LACTATED RINGERS IV SOLN: INTRAVENOUS | Status: DC

## 2022-07-14 MED ORDER — SODIUM CHLORIDE 0.9% FLUSH
3.0000 mL | Freq: Two times a day (BID) | INTRAVENOUS | Status: DC
  Administered 2022-07-14 – 2022-07-19 (×9): 3 mL via INTRAVENOUS

## 2022-07-14 MED ORDER — SERTRALINE HCL 50 MG PO TABS: 25.0000 mg | ORAL_TABLET | Freq: Every day | ORAL | Status: DC

## 2022-07-14 MED ORDER — FENTANYL CITRATE PF 50 MCG/ML IJ SOSY
50.0000 ug | PREFILLED_SYRINGE | Freq: Once | INTRAMUSCULAR | Status: AC
  Administered 2022-07-14: 50 ug via INTRAVENOUS
  Filled 2022-07-14 (×2): qty 1

## 2022-07-14 MED ORDER — ENOXAPARIN SODIUM 40 MG/0.4ML IJ SOSY
40.0000 mg | PREFILLED_SYRINGE | INTRAMUSCULAR | Status: DC
  Administered 2022-07-15 – 2022-07-19 (×5): 40 mg via SUBCUTANEOUS
  Filled 2022-07-14 (×5): qty 0.4

## 2022-07-14 MED ORDER — LEVOTHYROXINE SODIUM 50 MCG PO TABS
100.0000 ug | ORAL_TABLET | Freq: Every day | ORAL | Status: DC
  Administered 2022-07-15: 100 ug via ORAL
  Filled 2022-07-14: qty 2

## 2022-07-14 MED ORDER — CELECOXIB 200 MG PO CAPS
200.0000 mg | ORAL_CAPSULE | Freq: Two times a day (BID) | ORAL | Status: AC
  Administered 2022-07-15 – 2022-07-17 (×6): 200 mg via ORAL
  Filled 2022-07-14 (×6): qty 1

## 2022-07-14 MED ORDER — SENNOSIDES-DOCUSATE SODIUM 8.6-50 MG PO TABS
1.0000 | ORAL_TABLET | Freq: Two times a day (BID) | ORAL | Status: DC
  Administered 2022-07-14: 1 via ORAL
  Filled 2022-07-14: qty 1

## 2022-07-14 MED ORDER — POLYETHYLENE GLYCOL 3350 17 G PO PACK
17.0000 g | PACK | Freq: Every day | ORAL | Status: DC
  Administered 2022-07-15 – 2022-07-19 (×3): 17 g via ORAL
  Filled 2022-07-14 (×3): qty 1

## 2022-07-14 MED ORDER — MEMANTINE HCL 5 MG PO TABS
5.0000 mg | ORAL_TABLET | Freq: Two times a day (BID) | ORAL | Status: DC
  Administered 2022-07-15: 5 mg via ORAL
  Filled 2022-07-14: qty 1

## 2022-07-14 MED ORDER — ACETAMINOPHEN 325 MG PO TABS
650.0000 mg | ORAL_TABLET | ORAL | Status: AC
  Administered 2022-07-15 – 2022-07-17 (×13): 650 mg via ORAL
  Filled 2022-07-14 (×13): qty 2

## 2022-07-14 MED ORDER — QUETIAPINE FUMARATE 25 MG PO TABS: 25.0000 mg | ORAL_TABLET | Freq: Every evening | ORAL | Status: DC | PRN

## 2022-07-14 MED ORDER — AMLODIPINE BESYLATE 5 MG PO TABS: 5.0000 mg | ORAL_TABLET | Freq: Every day | ORAL | Status: DC

## 2022-07-14 MED ORDER — RIVASTIGMINE TARTRATE 3 MG PO CAPS
3.0000 mg | ORAL_CAPSULE | Freq: Two times a day (BID) | ORAL | Status: DC
  Administered 2022-07-15: 3 mg via ORAL
  Filled 2022-07-14 (×2): qty 1

## 2022-07-14 MED ORDER — TRAMADOL HCL 50 MG PO TABS: 50.0000 mg | ORAL_TABLET | Freq: Four times a day (QID) | ORAL | Status: DC | PRN

## 2022-07-14 MED ORDER — OXYCODONE HCL 5 MG PO TABS: 5.0000 mg | ORAL_TABLET | ORAL | Status: DC | PRN

## 2022-07-14 NOTE — ED Notes (Signed)
Updated [name] (daughter) over  the phone on plan of care.

## 2022-07-14 NOTE — ED Provider Notes (Signed)
[facility] Provider Note    Event Date/Time   First MD Initiated Contact with Patient 07/14/22 1657     (approximate)  History   Chief Complaint: Fall  HPI  Lisa Martin is a 89 y.o. female with a past medical history of dementia, hypertension, presents to the emergency department after a fall from her nursing facility with right hip pain.  According to report patient is coming from [facility] for an unwitnessed fall.  Patient is having pain in the right leg and has a shortened right lower extremity.  Patient has a history of a prior intertrochanteric fracture with a nail and screw fixation previously.  Patient denies any other complaints.  Physical Exam   Triage Vital Signs: ED Triage Vitals  Enc Vitals Group     BP 07/14/22 1703 (!) 149/73     Pulse Rate 07/14/22 1701 79     Resp 07/14/22 1701 (!) 27     Temp 07/14/22 1701 98.3 F (36.8 C)     Temp Source 07/14/22 1701 Oral     SpO2 07/14/22 1701 98 %     Weight 07/14/22 1702 100 lb 14.4 oz (45.8 kg)     Height --      Head Circumference --      Peak Flow --      Pain Score --      Pain Loc --      Pain Edu? --      Excl. in GC? --     Most recent vital signs: Vitals:   07/14/22 1701 07/14/22 1703  BP:  (!) 149/73  Pulse: 79   Resp: (!) 27   Temp: 98.3 F (36.8 C)   SpO2: 98%     General: Awake, mild apparent pain. CV:  Good peripheral perfusion.  Regular rate and rhythm  Resp:  Normal effort.  Equal breath sounds bilaterally.  Abd:  No distention.  Soft, nontender.  No rebound or guarding. Other:  Mild pain with right hip palpation.  Significant pain with attempted range of motion of the right hip.  Neurovascular intact distally.   ED Results / Procedures / Treatments   EKG  EKG viewed and interpreted by myself shows a normal sinus rhythm at 79 bpm with a narrow QRS, left axis deviation, largely normal intervals with nonspecific ST changes.  RADIOLOGY  I have reviewed  and interpreted the x-ray images of the hip/pelvis no obvious hip fracture seen patient has old hardware intact.  There does appear to be a pelvis fracture. Radiology confirms acetabular fracture. CT scan shows acute comminuted fracture of the iliac crest right acetabulum as well as superior and inferior pubic rami.   MEDICATIONS ORDERED IN ED: Medications - No data to display   IMPRESSION / MDM / ASSESSMENT AND PLAN / ED COURSE  I reviewed the triage vital signs and the nursing notes.  Patient's presentation is most consistent with acute presentation with potential threat to life or bodily function.  Patient presents emergency department for right hip pain after a unwitnessed fall at her nursing facility.  Patient has baseline dementia and cannot provide any additional history.  No obvious signs of any head injury, good range of motion in the remaining extremities without any obvious tenderness elicited.  Will obtain hip and femur x-rays will obtain CT scans of the head and C-spine as precaution.  Will continue to closely monitor while awaiting results.  Patient agreeable to plan of care.  X-ray shows likely pelvis fracture.  Lab work is reassuring with a normal CBC with just slight leukocytosis 13,000, chemistry reassuring besides slight hyperglycemia.  Given the x-ray findings we will proceed with CT imaging to further evaluate.  CT confirms acetabular fracture on the right with iliac crest fracture as well as inferior and superior pubic rami fractures.  I spoke to Dr. Signa Kell of orthopedics who request that we discussed with [facility] for possible operative repair.  I spoke with [facility] they have reviewed the CT imaging and believe that the patient would not be an operative candidate and he would be nonoperative treatment.  I spoke with Dr. Allena Katz locally who is agreeable with admission to the hospitalist service with orthopedics consulting.  FINAL CLINICAL IMPRESSION(S)  / ED DIAGNOSES   Fall Right hip pain Pelvis fracture   Note:  This document was prepared using Dragon voice recognition software and may include unintentional dictation errors.   Minna Antis, MD 07/14/22 2142

## 2022-07-14 NOTE — Assessment & Plan Note (Signed)
.   Acute comminuted fracture deformities involving the inferior aspect of the right iliac crest and right acetabulum, as well as the right superior and right inferior pubic rami. 2. Prior ORIF of the proximal right femur. 3. Intact total left hip replacement.  Follow-up consult by Dr. Signa Kell.  At this time plan for conservative management.  Ambulate patient as per Ortho recommendation
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
## 2022-07-14 NOTE — ED Triage Notes (Signed)
Pt to ED via [facility] from [facility] for unwitnessed fall. Pt states that she reminders falling but does not know why she fell. Pt has shortening and inward rotation of her right leg. Pt has hx/o intertrochanteric fracture of the right femur. Pt was given 50 mcg of Fentanyl by EMS. Pt states this has helped with her pain. Pt does have a small skin tear on her lower right lateral leg. Pt has hx/o Dementia. Vital signs stable with EMS.

## 2022-07-14 NOTE — Assessment & Plan Note (Signed)
This seems to be chronic.  However given inability to get good history from patient, I will check urinalysis as well as blood cultures.  Low threshold for starting antibiotics.  Add on LFTs

## 2022-07-14 NOTE — Assessment & Plan Note (Signed)
Unwitnessed.  Given patient's dementia, further details not available.  Working diagnosis is that this was a mechanical fall.  However I will keep the patient on telemetry to make sure were not missing any syncope like events.  I will check a single troponin.  I will change patient Seroquel to as needed for nighttime use.  And in general favor simplification of patient's pharmacy.  Ambulate patient after Ortho evaluation

## 2022-07-15 DIAGNOSIS — Y92129 Unspecified place in nursing home as the place of occurrence of the external cause: Secondary | ICD-10-CM | POA: Diagnosis not present

## 2022-07-15 DIAGNOSIS — W1830XA Fall on same level, unspecified, initial encounter: Secondary | ICD-10-CM | POA: Diagnosis present

## 2022-07-15 DIAGNOSIS — R531 Weakness: Secondary | ICD-10-CM | POA: Diagnosis not present

## 2022-07-15 DIAGNOSIS — B9620 Unspecified Escherichia coli [E. coli] as the cause of diseases classified elsewhere: Secondary | ICD-10-CM | POA: Diagnosis not present

## 2022-07-15 DIAGNOSIS — N3090 Cystitis, unspecified without hematuria: Secondary | ICD-10-CM | POA: Diagnosis not present

## 2022-07-15 DIAGNOSIS — I10 Essential (primary) hypertension: Secondary | ICD-10-CM | POA: Diagnosis not present

## 2022-07-15 DIAGNOSIS — Z66 Do not resuscitate: Secondary | ICD-10-CM | POA: Diagnosis not present

## 2022-07-15 DIAGNOSIS — Z955 Presence of coronary angioplasty implant and graft: Secondary | ICD-10-CM | POA: Diagnosis not present

## 2022-07-15 DIAGNOSIS — Z9221 Personal history of antineoplastic chemotherapy: Secondary | ICD-10-CM | POA: Diagnosis not present

## 2022-07-15 DIAGNOSIS — Z923 Personal history of irradiation: Secondary | ICD-10-CM | POA: Diagnosis not present

## 2022-07-15 DIAGNOSIS — F0283 Dementia in other diseases classified elsewhere, unspecified severity, with mood disturbance: Secondary | ICD-10-CM | POA: Diagnosis not present

## 2022-07-15 DIAGNOSIS — R296 Repeated falls: Secondary | ICD-10-CM | POA: Diagnosis not present

## 2022-07-15 DIAGNOSIS — Z96642 Presence of left artificial hip joint: Secondary | ICD-10-CM | POA: Diagnosis not present

## 2022-07-15 DIAGNOSIS — Z7401 Bed confinement status: Secondary | ICD-10-CM | POA: Diagnosis not present

## 2022-07-15 DIAGNOSIS — S32471A Displaced fracture of medial wall of right acetabulum, initial encounter for closed fracture: Secondary | ICD-10-CM | POA: Diagnosis not present

## 2022-07-15 DIAGNOSIS — Z87891 Personal history of nicotine dependence: Secondary | ICD-10-CM | POA: Diagnosis not present

## 2022-07-15 DIAGNOSIS — Z853 Personal history of malignant neoplasm of breast: Secondary | ICD-10-CM | POA: Diagnosis not present

## 2022-07-15 DIAGNOSIS — Z79899 Other long term (current) drug therapy: Secondary | ICD-10-CM | POA: Diagnosis not present

## 2022-07-15 DIAGNOSIS — E039 Hypothyroidism, unspecified: Secondary | ICD-10-CM | POA: Diagnosis not present

## 2022-07-15 DIAGNOSIS — M25551 Pain in right hip: Secondary | ICD-10-CM | POA: Diagnosis present

## 2022-07-15 DIAGNOSIS — F32A Depression, unspecified: Secondary | ICD-10-CM | POA: Diagnosis not present

## 2022-07-15 DIAGNOSIS — Z79890 Hormone replacement therapy (postmenopausal): Secondary | ICD-10-CM | POA: Diagnosis not present

## 2022-07-15 DIAGNOSIS — G309 Alzheimer's disease, unspecified: Secondary | ICD-10-CM | POA: Diagnosis not present

## 2022-07-15 DIAGNOSIS — Z888 Allergy status to other drugs, medicaments and biological substances status: Secondary | ICD-10-CM | POA: Diagnosis not present

## 2022-07-15 LAB — CBC
HCT: 27.6 % — ABNORMAL LOW (ref 36.0–46.0)
Hemoglobin: 9.4 g/dL — ABNORMAL LOW (ref 12.0–15.0)
MCH: 28.7 pg (ref 26.0–34.0)
MCHC: 34.1 g/dL (ref 30.0–36.0)
MCV: 84.4 fL (ref 80.0–100.0)
Platelets: 340 10*3/uL (ref 150–400)
RBC: 3.27 MIL/uL — ABNORMAL LOW (ref 3.87–5.11)
RDW: 17.3 % — ABNORMAL HIGH (ref 11.5–15.5)
WBC: 13.9 10*3/uL — ABNORMAL HIGH (ref 4.0–10.5)
nRBC: 0 % (ref 0.0–0.2)

## 2022-07-15 LAB — URINALYSIS, ROUTINE W REFLEX MICROSCOPIC
Bilirubin Urine: NEGATIVE
Glucose, UA: NEGATIVE mg/dL
Hgb urine dipstick: NEGATIVE
Ketones, ur: NEGATIVE mg/dL
Nitrite: POSITIVE — AB
Protein, ur: NEGATIVE mg/dL
Specific Gravity, Urine: 1.013 (ref 1.005–1.030)
pH: 6 (ref 5.0–8.0)

## 2022-07-15 LAB — URINE CULTURE: Culture: NO GROWTH

## 2022-07-15 LAB — BASIC METABOLIC PANEL
Anion gap: 5 (ref 5–15)
BUN: 21 mg/dL (ref 8–23)
CO2: 21 mmol/L — ABNORMAL LOW (ref 22–32)
Calcium: 8.2 mg/dL — ABNORMAL LOW (ref 8.9–10.3)
Chloride: 110 mmol/L (ref 98–111)
Creatinine, Ser: 0.73 mg/dL (ref 0.44–1.00)
GFR, Estimated: >60 mL/min (ref >60)
Glucose, Bld: 108 mg/dL — ABNORMAL HIGH (ref 70–99)
Potassium: 4.3 mmol/L (ref 3.5–5.1)
Sodium: 136 mmol/L (ref 135–145)

## 2022-07-15 LAB — PROTIME-INR
INR: 1.3 — ABNORMAL HIGH (ref 0.8–1.2)
Prothrombin Time: 16 seconds — ABNORMAL HIGH (ref 11.4–15.2)

## 2022-07-15 LAB — APTT: aPTT: 39 seconds — ABNORMAL HIGH (ref 24–36)

## 2022-07-15 LAB — MRSA NEXT GEN BY PCR, NASAL: MRSA by PCR Next Gen: NOT DETECTED

## 2022-07-15 LAB — CREATININE, SERUM
Creatinine, Ser: 0.73 mg/dL (ref 0.44–1.00)
GFR, Estimated: >60 mL/min (ref >60)

## 2022-07-15 MED ORDER — VITAMIN D (ERGOCALCIFEROL) 1.25 MG (50000 UNIT) PO CAPS
50000.0000 [IU] | ORAL_CAPSULE | ORAL | Status: DC
  Administered 2022-07-18: 50000 [IU] via ORAL
  Filled 2022-07-15: qty 1

## 2022-07-15 MED ORDER — ADULT MULTIVITAMIN W/MINERALS CH
1.0000 | ORAL_TABLET | Freq: Every day | ORAL | Status: DC
  Administered 2022-07-15 – 2022-07-19 (×5): 1 via ORAL
  Filled 2022-07-15 (×5): qty 1

## 2022-07-15 MED ORDER — MEGESTROL ACETATE 400 MG/10ML PO SUSP
400.0000 mg | Freq: Every day | ORAL | Status: DC
  Administered 2022-07-15 – 2022-07-19 (×5): 400 mg via ORAL
  Filled 2022-07-15 (×5): qty 10

## 2022-07-15 MED ORDER — SERTRALINE HCL 50 MG PO TABS
25.0000 mg | ORAL_TABLET | Freq: Every day | ORAL | Status: DC
  Administered 2022-07-15 – 2022-07-19 (×5): 25 mg via ORAL
  Filled 2022-07-15 (×5): qty 1

## 2022-07-15 MED ORDER — RIVASTIGMINE TARTRATE 3 MG PO CAPS
3.0000 mg | ORAL_CAPSULE | Freq: Two times a day (BID) | ORAL | Status: DC
  Administered 2022-07-15 – 2022-07-19 (×9): 3 mg via ORAL
  Filled 2022-07-15 (×9): qty 1

## 2022-07-15 MED ORDER — ENSURE ENLIVE PO LIQD
237.0000 mL | Freq: Three times a day (TID) | ORAL | Status: DC
  Administered 2022-07-15 – 2022-07-19 (×11): 237 mL via ORAL

## 2022-07-15 MED ORDER — QUETIAPINE FUMARATE 25 MG PO TABS
25.0000 mg | ORAL_TABLET | Freq: Every day | ORAL | Status: DC
  Administered 2022-07-15 – 2022-07-18 (×4): 25 mg via ORAL
  Filled 2022-07-15 (×4): qty 1

## 2022-07-15 MED ORDER — AMLODIPINE BESYLATE 5 MG PO TABS
5.0000 mg | ORAL_TABLET | Freq: Every day | ORAL | Status: DC
  Administered 2022-07-15 – 2022-07-19 (×5): 5 mg via ORAL
  Filled 2022-07-15 (×5): qty 1

## 2022-07-15 MED ORDER — SENNOSIDES-DOCUSATE SODIUM 8.6-50 MG PO TABS
1.0000 | ORAL_TABLET | Freq: Two times a day (BID) | ORAL | Status: DC
  Administered 2022-07-15 – 2022-07-19 (×8): 1 via ORAL
  Filled 2022-07-15 (×8): qty 1

## 2022-07-15 MED ORDER — OXYCODONE HCL 5 MG PO TABS
5.0000 mg | ORAL_TABLET | ORAL | Status: DC | PRN
  Administered 2022-07-16 – 2022-07-18 (×2): 5 mg via ORAL
  Filled 2022-07-15 (×2): qty 1

## 2022-07-15 MED ORDER — PANTOPRAZOLE SODIUM 40 MG PO TBEC
40.0000 mg | DELAYED_RELEASE_TABLET | Freq: Every day | ORAL | Status: DC
  Administered 2022-07-15 – 2022-07-19 (×5): 40 mg via ORAL
  Filled 2022-07-15 (×5): qty 1

## 2022-07-15 MED ORDER — LEVOTHYROXINE SODIUM 88 MCG PO TABS
88.0000 ug | ORAL_TABLET | Freq: Every day | ORAL | Status: DC
  Administered 2022-07-16 – 2022-07-19 (×4): 88 ug via ORAL
  Filled 2022-07-15 (×4): qty 1

## 2022-07-15 MED ORDER — CYANOCOBALAMIN 500 MCG PO TABS
1000.0000 ug | ORAL_TABLET | Freq: Every day | ORAL | Status: DC
  Administered 2022-07-16 – 2022-07-19 (×4): 1000 ug via ORAL
  Filled 2022-07-15 (×5): qty 2

## 2022-07-15 MED ORDER — MEMANTINE HCL 5 MG PO TABS
5.0000 mg | ORAL_TABLET | Freq: Two times a day (BID) | ORAL | Status: DC
  Administered 2022-07-15 – 2022-07-19 (×9): 5 mg via ORAL
  Filled 2022-07-15 (×9): qty 1

## 2022-07-15 MED ORDER — POLYETHYLENE GLYCOL 3350 17 G PO PACK: 17.0000 g | PACK | Freq: Every day | ORAL | Status: DC | PRN

## 2022-07-15 MED ORDER — SODIUM CHLORIDE 0.9 % IV SOLN
1.0000 g | INTRAVENOUS | Status: DC
  Administered 2022-07-15: 1 g via INTRAVENOUS
  Filled 2022-07-15: qty 10

## 2022-07-15 NOTE — TOC Progression Note (Signed)
Transition of Care [facility]) - Progression Note    Patient Details  Name: Lisa Martin MRN: [id number] Date of Birth: 1931-11-27  Transition of Care [facility]) CM/SW Contact  Marlowe Sax, RN Phone Number: 07/15/2022, 3:31 PM  Clinical Narrative:     Resident of [facility] long term care Non surgical TOC to assist with DC planning       Expected Discharge Plan and Services                                               Social Determinants of Health (SDOH) Interventions SDOH Screenings   Food Insecurity: No Food Insecurity (07/15/2022)  Housing: Low Risk  (07/15/2022)  Transportation Needs: No Transportation Needs (07/15/2022)  Utilities: Not At Risk (07/15/2022)  Tobacco Use: Medium Risk (07/14/2022)    Readmission Risk Interventions     No data to display

## 2022-07-15 NOTE — Progress Notes (Signed)
PROGRESS NOTE    Lisa Martin  ZOX:[id number] DOB: [birth year] DOA: 07/14/2022 PCP: Danella Penton, MD    Brief Narrative:  89 y.o. female with medical history significant of dementia and other diagnoses as listed below.  Patient is a resident of a nursing home.  Patient had an unwitnessed fall earlier this a.m.  And subsequently brought into evaluation at [facility] ER.  Patient does not remember events of the day.   Patient unable to provide history.  Imaging survey revealed pelvic fracture.  Orthopedic consulted with likely plans for nonoperative management.  Patient also has grossly infected urine   Assessment & Plan:   Principal Problem:   Falls frequently Active Problems:   Fall at home, initial encounter   Leukocytosis   Pelvic fracture (HCC)  Status post fall Pelvic fracture Fall was unwitnessed.  Patient unable to provide history in setting of dementia.  Unable to exclude syncopal event but suspect mechanical fall. Plan: Pain control PT and OT Orthopedic follow-up  Urinary tract infection Urinalysis grossly infected Plan: IVF IV Rocephin Follow cultures  Dementia, Alzheimer's type Decreased appetite Depression No acute issues Continue Namenda and Megace Continue nightly Seroquel Continue daily Zoloft  Essential hypertension PTA Norvasc  Hypothyroid PTA Synthroid  DVT prophylaxis: lovenox Code Status: DNR Family Communication: Daughter Blenda Bridegroom [phone] on 6/14 Disposition Plan: Status is: Observation The patient will require care spanning > 2 midnights and should be moved to inpatient because: UTI on IV antibiotics.  Pelvic fracture requiring therapy and pain control   Level of care: Telemetry Medical  Consultants:  Orthopedics  Procedures:  None  Antimicrobials: Ceftriaxone   Subjective: Seen and examined.  History limited by underlying dementia.  Objective: Vitals:   07/15/22 0600 07/15/22 0700 07/15/22 0800 07/15/22 1100  BP:  127/70 136/70 126/66 129/73  Pulse: 69 66 67 72  Resp: 18 16 16    Temp:      TempSrc:      SpO2: 100% 100% 100% 99%  Weight:        Intake/Output Summary (Last 24 hours) at 07/15/2022 1159 Last data filed at 07/15/2022 5621 Gross per 24 hour  Intake 605.83 ml  Output --  Net 605.83 ml   Filed Weights   07/14/22 1702  Weight: 45.8 kg    Examination:  General exam: NAD.  Appears frail Respiratory system: Scattered crackles.  No work of breathing.  Room air Cardiovascular system: S1-S2 2, RRR, 2/6 murmur, no edema Gastrointestinal system: Thin, soft, NT/ND, normal bowel sounds Central nervous system: Unable to assess orientation Extremities: Symmetric decreased power Skin: Thin and pale with no obvious rashes or lesions Psychiatry: Unable to assess    Data Reviewed: I have personally reviewed following labs and imaging studies  CBC: Recent Labs  Lab 07/14/22 1706 07/14/22 2333 07/15/22 0434  WBC 13.8*  13.7* 17.4* 13.9*  NEUTROABS 11.1*  --   --   HGB 10.8*  10.8* 10.7* 9.4*  HCT 32.3*  32.1* 30.9* 27.6*  MCV 86.4  84.9 82.8 84.4  PLT 472*  460* 397 340   Basic Metabolic Panel: Recent Labs  Lab 07/14/22 1706 07/14/22 2333 07/15/22 0434  NA 137  --  136  K 4.2  --  4.3  CL 107  --  110  CO2 20*  --  21*  GLUCOSE 156*  --  108*  BUN 23  --  21  CREATININE 0.90 0.73 0.73  CALCIUM 8.6*  --  8.2*   GFR: Estimated  Creatinine Clearance: 35.1 mL/min (by C-G formula based on SCr of 0.73 mg/dL). Liver Function Tests: Recent Labs  Lab 07/14/22 1706  AST 23  ALT 19  ALKPHOS 120  BILITOT 0.7  PROT 7.2  ALBUMIN 3.2*   No results for input(s): "LIPASE", "AMYLASE" in the last 168 hours. No results for input(s): "AMMONIA" in the last 168 hours. Coagulation Profile: Recent Labs  Lab 07/15/22 0434  INR 1.3*   Cardiac Enzymes: No results for input(s): "CKTOTAL", "CKMB", "CKMBINDEX", "TROPONINI" in the last 168 hours. BNP (last 3 results) No results  for input(s): "PROBNP" in the last 8760 hours. HbA1C: No results for input(s): "HGBA1C" in the last 72 hours. CBG: No results for input(s): "GLUCAP" in the last 168 hours. Lipid Profile: No results for input(s): "CHOL", "HDL", "LDLCALC", "TRIG", "CHOLHDL", "LDLDIRECT" in the last 72 hours. Thyroid Function Tests: No results for input(s): "TSH", "T4TOTAL", "FREET4", "T3FREE", "THYROIDAB" in the last 72 hours. Anemia Panel: No results for input(s): "VITAMINB12", "FOLATE", "FERRITIN", "TIBC", "IRON", "RETICCTPCT" in the last 72 hours. Sepsis Labs: No results for input(s): "PROCALCITON", "LATICACIDVEN" in the last 168 hours.  Recent Results (from the past 240 hour(s))  Culture, blood (Routine X 2) w Reflex to ID Panel     Status: None (Preliminary result)   Collection Time: 07/14/22 11:33 PM   Specimen: BLOOD  Result Value Ref Range Status   Specimen Description BLOOD BLOOD RIGHT ARM  Final   Special Requests   Final    BOTTLES DRAWN AEROBIC AND ANAEROBIC Blood Culture results may not be optimal due to an excessive volume of blood received in culture bottles   Culture   Final    NO GROWTH < 12 HOURS Performed at [facility], [address], [place], [place] [address]    Report Status PENDING  Incomplete  Culture, blood (Routine X 2) w Reflex to ID Panel     Status: None (Preliminary result)   Collection Time: 07/14/22 11:33 PM   Specimen: BLOOD  Result Value Ref Range Status   Specimen Description BLOOD BLOOD RIGHT ARM  Final   Special Requests   Final    BOTTLES DRAWN AEROBIC AND ANAEROBIC Blood Culture results may not be optimal due to an excessive volume of blood received in culture bottles   Culture   Final    NO GROWTH < 12 HOURS Performed at [facility], [address], [place], [place] [address]    Report Status PENDING  Incomplete  Urine Culture     Status: None   Collection Time: 07/15/22  1:42 AM   Specimen: Urine, Random  Result Value Ref Range  Status   Specimen Description   Final    URINE, RANDOM Performed at [facility], [address], [place], [place] [address]    Special Requests   Final    NONE Performed at [facility], [address] [place], [place] [address]    Culture   Final    NO GROWTH Performed at [facility], [address] [place], [place] [address]    Report Status 07/15/2022 FINAL  Final         Radiology Studies: CT PELVIS WO CONTRAST  Result Date: 07/14/2022 CLINICAL DATA:  Status post fall. EXAM: CT PELVIS WITHOUT CONTRAST TECHNIQUE: Multidetector CT imaging of the pelvis was performed following the standard protocol without intravenous contrast. RADIATION DOSE REDUCTION: This exam was performed according to the departmental dose-optimization program which includes automated exposure control,  adjustment of the mA and/or kV according to patient size and/or use of iterative reconstruction technique. COMPARISON:  Jun 24, 2022 FINDINGS: Urinary Tract: No abnormality visualized. The urinary bladder is limited in evaluation secondary to overlying streak artifact, and is otherwise unremarkable. Bowel: There is no evidence of bowel dilatation. Stool is seen throughout the large bowel. Noninflamed diverticula are noted throughout the sigmoid colon. Vascular/Lymphatic: There is marked severity calcification of the visualized portion of the abdominal aorta and bilateral common iliac arteries, without evidence of aneurysmal dilatation. No abnormal abdominal or pelvic lymph nodes are identified. Reproductive: Several small calcified uterine fibroids are seen. The bilateral adnexa are unremarkable. Other: Tiny gallstones are seen within the visualized portion of the gallbladder lumen. Musculoskeletal: A total left hip replacement is seen. A metallic density intramedullary rod and compression screw device are present within the proximal right femur. There is associated streak artifact with  subsequently limited evaluation of the adjacent osseous and soft tissue structures. An acute comminuted fracture deformity is seen involving the inferior aspect of the right iliac crest. This extends to involve the anterior, posterior and medial aspects of the right acetabulum. Additional nondisplaced fractures of the right superior and right inferior pubic rami are noted. A 2.1 cm x 1.0 cm fracture fragment is seen anterior to the right hip. This is present on the prior exam. IMPRESSION: 1. Acute comminuted fracture deformities involving the inferior aspect of the right iliac crest and right acetabulum, as well as the right superior and right inferior pubic rami. 2. Prior ORIF of the proximal right femur. 3. Intact total left hip replacement. 4. Sigmoid diverticulosis. 5. Cholelithiasis. 6. Aortic atherosclerosis. Aortic Atherosclerosis (ICD10-I70.0). Electronically Signed   By: Aram Candela M.D.   On: 07/14/2022 19:08   CT Cervical Spine Wo Contrast  Result Date: 07/14/2022 CLINICAL DATA:  Trauma, fall EXAM: CT CERVICAL SPINE WITHOUT CONTRAST TECHNIQUE: Multidetector CT imaging of the cervical spine was performed without intravenous contrast. Multiplanar CT image reconstructions were also generated. RADIATION DOSE REDUCTION: This exam was performed according to the departmental dose-optimization program which includes automated exposure control, adjustment of the mA and/or kV according to patient size and/or use of iterative reconstruction technique. COMPARISON:  05/22/2022 FINDINGS: Alignment: There is minimal anterolisthesis at C3-C4 level which has not changed. This may be residual change from previous ligament injury and facet degeneration. There is mild levoscoliosis at the cervicothoracic junction. Skull base and vertebrae: No recent fracture is seen. Degenerative changes are noted with disc space narrowing, bony spurs and facet hypertrophy at multiple levels. Soft tissues and spinal canal: There is  no significant central spinal stenosis. Disc levels: There is encroachment of neural foramina from C2 to C7 levels. Upper chest: Breathing motion limits evaluation. Subtle increased interstitial markings are seen in the anterior right upper lung field and posterior left upper lung field, possibly scarring. Other: Arterial calcifications are seen. IMPRESSION: No recent fracture is seen in cervical spine. Cervical spondylosis with encroachment of neural foramina from C2-C7 levels. Electronically Signed   By: Ernie Avena M.D.   On: 07/14/2022 18:28   CT HEAD WO CONTRAST ( )  Result Date: 07/14/2022 CLINICAL DATA:  Trauma, fall EXAM: CT HEAD WITHOUT CONTRAST TECHNIQUE: Contiguous axial images were obtained from the base of the skull through the vertex without intravenous contrast. RADIATION DOSE REDUCTION: This exam was performed according to the departmental dose-optimization program which includes automated exposure control, adjustment of the mA and/or kV according to patient size and/or use of iterative  reconstruction technique. COMPARISON:  05/22/2022 FINDINGS: Brain: No acute intracranial findings are seen. There are no signs of bleeding within the cranium. Cortical sulci are prominent. There is decreased density in periventricular and subcortical white matter. Vascular: Scattered arterial calcifications are seen. Skull: No acute findings are seen in calvarium. Sinuses/Orbits: There is opacification of right maxillary sinus. There is mucosal thickening in right ethmoid sinus. Other: None. IMPRESSION: No acute intracranial findings are seen in noncontrast CT brain. Atrophy. Small-vessel disease. Chronic sinusitis. Electronically Signed   By: Ernie Avena M.D.   On: 07/14/2022 18:23   DG FEMUR, MIN 2 VIEWS RIGHT  Result Date: 07/14/2022 CLINICAL DATA:  Trauma, fall EXAM: RIGHT FEMUR 2 VIEWS COMPARISON:  05/22/2022 FINDINGS: There is interval internal fixation of intertrochanteric fracture of  right femur. No recent fracture or dislocation is seen in right femur. There is a new fracture in the roof of right acetabulum. There is mild buckling of upper margin of right pubic bone. There is a cortical irregularity at the junction of body and right inferior pubic ramus which may have been there in the previous study. IMPRESSION: Interval internal fixation of intertrochanteric fracture of right femur. No new fractures are seen in right femur. There is new fracture in right acetabulum. There is minimal cortical irregularity in the upper margin of right pubic bone suggesting possible recent or old fracture. Fracture at the junction of the right pubic bone and right inferior pubic ramus may be old. Electronically Signed   By: Ernie Avena M.D.   On: 07/14/2022 18:20   DG Chest 1 View  Result Date: 07/14/2022 CLINICAL DATA:  Trauma, fall EXAM: CHEST  1 VIEW COMPARISON:  05/22/2022 FINDINGS: Transverse diameter of heart is increased. There are no signs of alveolar pulmonary edema or focal pulmonary consolidation. There is no pleural effusion or pneumothorax. Deformities are noted in multiple left ribs and possibly right lower ribs with no significant change suggesting old fractures. Fractures are noted in the lateral aspect of left lower ribs with possible adjacent callus formation. There is previous vertebroplasty in midthoracic spine. There is previous surgical fusion at thoracolumbar junction. IMPRESSION: Cardiomegaly. There are no signs of pulmonary edema or focal pulmonary consolidation. Electronically Signed   By: Ernie Avena M.D.   On: 07/14/2022 18:15   DG Pelvis 1-2 Views  Result Date: 07/14/2022 CLINICAL DATA:  Trauma, fall EXAM: PELVIS - 1-2 VIEW COMPARISON:  05/22/2022 FINDINGS: There is previous left hip arthroplasty. There is previous internal fixation in right femur. There is a break in the cortical margin in the medial wall of right acetabulum. There is possible disruption of  upper margin of the right acetabulum. There is minimal cortical irregularity in both pubic bones which may be recent or old. IMPRESSION: There is recent comminuted fracture in medial wall and possibly upper margin of right acetabulum. Electronically Signed   By: Ernie Avena M.D.   On: 07/14/2022 18:13        Scheduled Meds:  acetaminophen  650 mg Oral Q4H   amLODipine  5 mg Oral Daily   celecoxib  200 mg Oral BID   cyanocobalamin  1,000 mcg Oral Daily   enoxaparin (LOVENOX) injection  40 mg Subcutaneous Q24H   feeding supplement  237 mL Oral TID BM   levothyroxine  100 mcg Oral Q0600   [START ON 07/16/2022] levothyroxine  88 mcg Oral QAC breakfast   megestrol  400 mg Oral Daily   memantine  5 mg Oral BID  multivitamin with minerals  1 tablet Oral Daily   pantoprazole  40 mg Oral Daily   polyethylene glycol  17 g Oral Daily   QUEtiapine  25 mg Oral QHS   rivastigmine  3 mg Oral BID   senna-docusate  1 tablet Oral BID   sertraline  25 mg Oral Daily   sodium chloride flush  3 mL Intravenous Q12H   [START ON 07/18/2022] Vitamin D (Ergocalciferol)  50,000 Units Oral Q7 days   Continuous Infusions:  cefTRIAXone (ROCEPHIN)  IV Stopped (07/15/22 1610)   lactated ringers 75 mL/hr at 07/15/22 9604     LOS: 0 days     Tresa Moore, MD [facility]   If 7PM-7AM, please contact night-coverage  07/15/2022, 11:59 AM

## 2022-07-15 NOTE — Progress Notes (Signed)
       CROSS COVER NOTE  NAME: CHARIYA DEOLIVEIRA MRN: [id number] DOB : Mar 13, 1931    Concern as stated by nurse / staff   Requested follow up on UA for admitting physician DR Maryjean Ka    Pertinent findings on chart review:   Assessment and  Interventions   Assessment: Ua results indicative of infection Plan: Urine culture added on Rocephin 1 gm daily IV initiated     Donnie Mesa NP [facility] Cross Cover 7pm-7am - check amion for availability Pager [phone]
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
## 2022-07-16 DIAGNOSIS — R296 Repeated falls: Secondary | ICD-10-CM | POA: Diagnosis not present

## 2022-07-16 LAB — CBC WITH DIFFERENTIAL/PLATELET
Abs Immature Granulocytes: 0.1 10*3/uL — ABNORMAL HIGH (ref 0.00–0.07)
Basophils Absolute: 0.1 10*3/uL (ref 0.0–0.1)
Basophils Relative: 1 %
Eosinophils Absolute: 0.3 10*3/uL (ref 0.0–0.5)
Eosinophils Relative: 2 %
HCT: 27.7 % — ABNORMAL LOW (ref 36.0–46.0)
Hemoglobin: 9.6 g/dL — ABNORMAL LOW (ref 12.0–15.0)
Immature Granulocytes: 1 %
Lymphocytes Relative: 10 %
Lymphs Abs: 1.3 10*3/uL (ref 0.7–4.0)
MCH: 28.9 pg (ref 26.0–34.0)
MCHC: 34.7 g/dL (ref 30.0–36.0)
MCV: 83.4 fL (ref 80.0–100.0)
Monocytes Absolute: 0.8 10*3/uL (ref 0.1–1.0)
Monocytes Relative: 6 %
Neutro Abs: 10.3 10*3/uL — ABNORMAL HIGH (ref 1.7–7.7)
Neutrophils Relative %: 80 %
Platelets: 332 10*3/uL (ref 150–400)
RBC: 3.32 MIL/uL — ABNORMAL LOW (ref 3.87–5.11)
RDW: 17.4 % — ABNORMAL HIGH (ref 11.5–15.5)
WBC: 12.9 10*3/uL — ABNORMAL HIGH (ref 4.0–10.5)
nRBC: 0 % (ref 0.0–0.2)

## 2022-07-16 LAB — BASIC METABOLIC PANEL
Anion gap: 11 (ref 5–15)
BUN: 23 mg/dL (ref 8–23)
CO2: 19 mmol/L — ABNORMAL LOW (ref 22–32)
Calcium: 8.2 mg/dL — ABNORMAL LOW (ref 8.9–10.3)
Chloride: 109 mmol/L (ref 98–111)
Creatinine, Ser: 0.77 mg/dL (ref 0.44–1.00)
GFR, Estimated: >60 mL/min (ref >60)
Glucose, Bld: 100 mg/dL — ABNORMAL HIGH (ref 70–99)
Potassium: 4.1 mmol/L (ref 3.5–5.1)
Sodium: 139 mmol/L (ref 135–145)

## 2022-07-16 LAB — CULTURE, BLOOD (ROUTINE X 2): Culture: NO GROWTH

## 2022-07-16 LAB — THYROID PANEL WITH TSH
Free Thyroxine Index: 1.8 (ref 1.2–4.9)
T3 Uptake Ratio: 31 % (ref 24–39)
T4, Total: 5.8 ug/dL (ref 4.5–12.0)
TSH: 12.2 u[IU]/mL — ABNORMAL HIGH (ref 0.450–4.500)

## 2022-07-16 NOTE — Progress Notes (Signed)
PROGRESS NOTE    Lisa Martin  JXB:[id number] DOB: 1931-03-20 DOA: 07/14/2022 PCP: Danella Penton, MD    Brief Narrative:  89 y.o. female with medical history significant of dementia and other diagnoses as listed below.  Patient is a resident of a nursing home.  Patient had an unwitnessed fall earlier this a.m.  And subsequently brought into evaluation at [facility].  Patient does not remember events of the day.   Patient unable to provide history.  Imaging survey revealed pelvic fracture.  Orthopedic consulted with likely plans for nonoperative management.  Patient also has grossly infected urine   Assessment & Plan:   Principal Problem:   Falls frequently Active Problems:   Fall at home, initial encounter   Leukocytosis   Pelvic fracture (HCC)  Status post fall Pelvic fracture Fall was unwitnessed.  Patient unable to provide history in setting of dementia.  Unable to exclude syncopal event but suspect mechanical fall. Plan: Pain control PT and OT Orthopedic follow-up  Urinary tract infection, ruled out Urinalysis positive for nitrites and leukocyte Estrace however interestingly urine culture is negative.  Antibiotics stopped on 6/14 Plan: Okay for diet.  Monitor off antibiotics.  Low threshold to restart antibiotics should any clinical signs of infection reappear  Dementia, Alzheimer's type Decreased appetite Depression No acute issues Continue Namenda and Megace Continue nightly Seroquel Continue daily Zoloft  Essential hypertension PTA Norvasc  Hypothyroid PTA Synthroid  DVT prophylaxis: lovenox Code Status: DNR Family Communication: Daughter Blenda Bridegroom [phone] on 6/14 Disposition Plan: Status is: Inpatient Remains inpatient appropriate because: Intractable pain.  Decreased ability to ambulate.     Level of care: Med-Surg  Consultants:  Orthopedics  Procedures:  None  Antimicrobials: Ceftriaxone   Subjective: Seen and examined.  Unable to  provide history.  No visible distress  Objective: Vitals:   07/15/22 1145 07/15/22 1543 07/16/22 0011 07/16/22 0843  BP: 133/66 133/82 131/61 (!) 150/73  Pulse: 79 80 75 88  Resp: 15 18 18 16   Temp: 98.3 F (36.8 C) 98.3 F (36.8 C) 98.7 F (37.1 C) 97.7 F (36.5 C)  TempSrc: Oral     SpO2: 96% 98% 95% 97%  Weight:      Height: 5\' 2"  (1.575 m)       Intake/Output Summary (Last 24 hours) at 07/16/2022 1125 Last data filed at 07/16/2022 1059 Gross per 24 hour  Intake 240 ml  Output 1350 ml  Net -1110 ml   Filed Weights   07/14/22 1702  Weight: 45.8 kg    Examination:  General exam: No acute distress.  Frail-appearing Respiratory system: Bibasilar crackles.  Normal work of breathing.  Room air Cardiovascular system: S1-S2 2, RRR, 2/6 murmur, no edema Gastrointestinal system: Thin, soft, NT/ND, normal bowel sounds Central nervous system: Unable to assess orientation Extremities: Symmetric decreased power Skin: Thin and pale with no obvious rashes or lesions Psychiatry: Unable to assess    Data Reviewed: I have personally reviewed following labs and imaging studies  CBC: Recent Labs  Lab 07/14/22 1706 07/14/22 2333 07/15/22 0434 07/16/22 1003  WBC 13.8*  13.7* 17.4* 13.9* 12.9*  NEUTROABS 11.1*  --   --  10.3*  HGB 10.8*  10.8* 10.7* 9.4* 9.6*  HCT 32.3*  32.1* 30.9* 27.6* 27.7*  MCV 86.4  84.9 82.8 84.4 83.4  PLT 472*  460* 397 340 332   Basic Metabolic Panel: Recent Labs  Lab 07/14/22 1706 07/14/22 2333 07/15/22 0434 07/16/22 1003  NA 137  --  136 139  K 4.2  --  4.3 4.1  CL 107  --  110 109  CO2 20*  --  21* 19*  GLUCOSE 156*  --  108* 100*  BUN 23  --  21 23  CREATININE 0.90 0.73 0.73 0.77  CALCIUM 8.6*  --  8.2* 8.2*   GFR: Estimated Creatinine Clearance: 35.1 mL/min (by C-G formula based on SCr of 0.77 mg/dL). Liver Function Tests: Recent Labs  Lab 07/14/22 1706  AST 23  ALT 19  ALKPHOS 120  BILITOT 0.7  PROT 7.2  ALBUMIN 3.2*    No results for input(s): "LIPASE", "AMYLASE" in the last 168 hours. No results for input(s): "AMMONIA" in the last 168 hours. Coagulation Profile: Recent Labs  Lab 07/15/22 0434  INR 1.3*   Cardiac Enzymes: No results for input(s): "CKTOTAL", "CKMB", "CKMBINDEX", "TROPONINI" in the last 168 hours. BNP (last 3 results) No results for input(s): "PROBNP" in the last 8760 hours. HbA1C: No results for input(s): "HGBA1C" in the last 72 hours. CBG: No results for input(s): "GLUCAP" in the last 168 hours. Lipid Profile: No results for input(s): "CHOL", "HDL", "LDLCALC", "TRIG", "CHOLHDL", "LDLDIRECT" in the last 72 hours. Thyroid Function Tests: Recent Labs    07/15/22 0434  TSH 12.200*  T4TOTAL 5.8   Anemia Panel: No results for input(s): "VITAMINB12", "FOLATE", "FERRITIN", "TIBC", "IRON", "RETICCTPCT" in the last 72 hours. Sepsis Labs: No results for input(s): "PROCALCITON", "LATICACIDVEN" in the last 168 hours.  Recent Results (from the past 240 hour(s))  Culture, blood (Routine X 2) w Reflex to ID Panel     Status: None (Preliminary result)   Collection Time: 07/14/22 11:33 PM   Specimen: BLOOD  Result Value Ref Range Status   Specimen Description BLOOD BLOOD RIGHT ARM  Final   Special Requests   Final    BOTTLES DRAWN AEROBIC AND ANAEROBIC Blood Culture results may not be optimal due to an excessive volume of blood received in culture bottles   Culture   Final    NO GROWTH 2 DAYS Performed at [facility], [address], [place], [place] [address]    Report Status PENDING  Incomplete  Culture, blood (Routine X 2) w Reflex to ID Panel     Status: None (Preliminary result)   Collection Time: 07/14/22 11:33 PM   Specimen: BLOOD  Result Value Ref Range Status   Specimen Description BLOOD BLOOD RIGHT ARM  Final   Special Requests   Final    BOTTLES DRAWN AEROBIC AND ANAEROBIC Blood Culture results may not be optimal due to an excessive volume of blood received  in culture bottles   Culture   Final    NO GROWTH 2 DAYS Performed at [facility], [address] [place], [place] [address]    Report Status PENDING  Incomplete  Urine Culture     Status: Abnormal (Preliminary result)   Collection Time: 07/15/22  1:42 AM   Specimen: Urine, Random  Result Value Ref Range Status   Specimen Description   Final    URINE, RANDOM Performed at [facility], [address], [place], [place] [address]    Special Requests   Final    NONE Performed at [facility], [address], [place], [place] [address]    Culture >=100,000 COLONIES/mL GRAM NEGATIVE RODS (A)  Final   Report Status PENDING  Incomplete  MRSA Next Gen by PCR, Nasal     Status: None   Collection Time:  07/15/22 12:55 PM   Specimen: Nasal Mucosa; Nasal Swab  Result Value Ref Range Status   MRSA by PCR Next Gen NOT DETECTED NOT DETECTED Final    Comment: (NOTE) The GeneXpert MRSA Assay (FDA approved for NASAL specimens only), is one component of a comprehensive MRSA colonization surveillance program. It is not intended to diagnose MRSA infection nor to guide or monitor treatment for MRSA infections. Test performance is not FDA approved in patients less than 43 years old. Performed at [facility], [address] [place], [place] [address]          Radiology Studies: CT PELVIS WO CONTRAST  Result Date: 07/14/2022 CLINICAL DATA:  Status post fall. EXAM: CT PELVIS WITHOUT CONTRAST TECHNIQUE: Multidetector CT imaging of the pelvis was performed following the standard protocol without intravenous contrast. RADIATION DOSE REDUCTION: This exam was performed according to the departmental dose-optimization program which includes automated exposure control, adjustment of the mA and/or kV according to patient size and/or use of iterative reconstruction technique. COMPARISON:  Jun 24, 2022 FINDINGS: Urinary Tract: No abnormality visualized. The urinary  bladder is limited in evaluation secondary to overlying streak artifact, and is otherwise unremarkable. Bowel: There is no evidence of bowel dilatation. Stool is seen throughout the large bowel. Noninflamed diverticula are noted throughout the sigmoid colon. Vascular/Lymphatic: There is marked severity calcification of the visualized portion of the abdominal aorta and bilateral common iliac arteries, without evidence of aneurysmal dilatation. No abnormal abdominal or pelvic lymph nodes are identified. Reproductive: Several small calcified uterine fibroids are seen. The bilateral adnexa are unremarkable. Other: Tiny gallstones are seen within the visualized portion of the gallbladder lumen. Musculoskeletal: A total left hip replacement is seen. A metallic density intramedullary rod and compression screw device are present within the proximal right femur. There is associated streak artifact with subsequently limited evaluation of the adjacent osseous and soft tissue structures. An acute comminuted fracture deformity is seen involving the inferior aspect of the right iliac crest. This extends to involve the anterior, posterior and medial aspects of the right acetabulum. Additional nondisplaced fractures of the right superior and right inferior pubic rami are noted. A 2.1 cm x 1.0 cm fracture fragment is seen anterior to the right hip. This is present on the prior exam. IMPRESSION: 1. Acute comminuted fracture deformities involving the inferior aspect of the right iliac crest and right acetabulum, as well as the right superior and right inferior pubic rami. 2. Prior ORIF of the proximal right femur. 3. Intact total left hip replacement. 4. Sigmoid diverticulosis. 5. Cholelithiasis. 6. Aortic atherosclerosis. Aortic Atherosclerosis (ICD10-I70.0). Electronically Signed   By: Aram Candela M.D.   On: 07/14/2022 19:08   CT Cervical Spine Wo Contrast  Result Date: 07/14/2022 CLINICAL DATA:  Trauma, fall EXAM: CT  CERVICAL SPINE WITHOUT CONTRAST TECHNIQUE: Multidetector CT imaging of the cervical spine was performed without intravenous contrast. Multiplanar CT image reconstructions were also generated. RADIATION DOSE REDUCTION: This exam was performed according to the departmental dose-optimization program which includes automated exposure control, adjustment of the mA and/or kV according to patient size and/or use of iterative reconstruction technique. COMPARISON:  05/22/2022 FINDINGS: Alignment: There is minimal anterolisthesis at C3-C4 level which has not changed. This may be residual change from previous ligament injury and facet degeneration. There is mild levoscoliosis at the cervicothoracic junction. Skull base and vertebrae: No recent fracture is seen. Degenerative changes are noted with disc space narrowing, bony spurs and facet hypertrophy at multiple levels. Soft tissues  and spinal canal: There is no significant central spinal stenosis. Disc levels: There is encroachment of neural foramina from C2 to C7 levels. Upper chest: Breathing motion limits evaluation. Subtle increased interstitial markings are seen in the anterior right upper lung field and posterior left upper lung field, possibly scarring. Other: Arterial calcifications are seen. IMPRESSION: No recent fracture is seen in cervical spine. Cervical spondylosis with encroachment of neural foramina from C2-C7 levels. Electronically Signed   By: Ernie Avena M.D.   On: 07/14/2022 18:28   CT HEAD WO CONTRAST ( )  Result Date: 07/14/2022 CLINICAL DATA:  Trauma, fall EXAM: CT HEAD WITHOUT CONTRAST TECHNIQUE: Contiguous axial images were obtained from the base of the skull through the vertex without intravenous contrast. RADIATION DOSE REDUCTION: This exam was performed according to the departmental dose-optimization program which includes automated exposure control, adjustment of the mA and/or kV according to patient size and/or use of iterative  reconstruction technique. COMPARISON:  05/22/2022 FINDINGS: Brain: No acute intracranial findings are seen. There are no signs of bleeding within the cranium. Cortical sulci are prominent. There is decreased density in periventricular and subcortical white matter. Vascular: Scattered arterial calcifications are seen. Skull: No acute findings are seen in calvarium. Sinuses/Orbits: There is opacification of right maxillary sinus. There is mucosal thickening in right ethmoid sinus. Other: None. IMPRESSION: No acute intracranial findings are seen in noncontrast CT brain. Atrophy. Small-vessel disease. Chronic sinusitis. Electronically Signed   By: Ernie Avena M.D.   On: 07/14/2022 18:23   DG FEMUR, MIN 2 VIEWS RIGHT  Result Date: 07/14/2022 CLINICAL DATA:  Trauma, fall EXAM: RIGHT FEMUR 2 VIEWS COMPARISON:  05/22/2022 FINDINGS: There is interval internal fixation of intertrochanteric fracture of right femur. No recent fracture or dislocation is seen in right femur. There is a new fracture in the roof of right acetabulum. There is mild buckling of upper margin of right pubic bone. There is a cortical irregularity at the junction of body and right inferior pubic ramus which may have been there in the previous study. IMPRESSION: Interval internal fixation of intertrochanteric fracture of right femur. No new fractures are seen in right femur. There is new fracture in right acetabulum. There is minimal cortical irregularity in the upper margin of right pubic bone suggesting possible recent or old fracture. Fracture at the junction of the right pubic bone and right inferior pubic ramus may be old. Electronically Signed   By: Ernie Avena M.D.   On: 07/14/2022 18:20   DG Chest 1 View  Result Date: 07/14/2022 CLINICAL DATA:  Trauma, fall EXAM: CHEST  1 VIEW COMPARISON:  05/22/2022 FINDINGS: Transverse diameter of heart is increased. There are no signs of alveolar pulmonary edema or focal pulmonary  consolidation. There is no pleural effusion or pneumothorax. Deformities are noted in multiple left ribs and possibly right lower ribs with no significant change suggesting old fractures. Fractures are noted in the lateral aspect of left lower ribs with possible adjacent callus formation. There is previous vertebroplasty in midthoracic spine. There is previous surgical fusion at thoracolumbar junction. IMPRESSION: Cardiomegaly. There are no signs of pulmonary edema or focal pulmonary consolidation. Electronically Signed   By: Ernie Avena M.D.   On: 07/14/2022 18:15   DG Pelvis 1-2 Views  Result Date: 07/14/2022 CLINICAL DATA:  Trauma, fall EXAM: PELVIS - 1-2 VIEW COMPARISON:  05/22/2022 FINDINGS: There is previous left hip arthroplasty. There is previous internal fixation in right femur. There is a break in the cortical margin in  the medial wall of right acetabulum. There is possible disruption of upper margin of the right acetabulum. There is minimal cortical irregularity in both pubic bones which may be recent or old. IMPRESSION: There is recent comminuted fracture in medial wall and possibly upper margin of right acetabulum. Electronically Signed   By: Ernie Avena M.D.   On: 07/14/2022 18:13        Scheduled Meds:  acetaminophen  650 mg Oral Q4H   amLODipine  5 mg Oral Daily   celecoxib  200 mg Oral BID   cyanocobalamin  1,000 mcg Oral Daily   enoxaparin (LOVENOX) injection  40 mg Subcutaneous Q24H   feeding supplement  237 mL Oral TID BM   levothyroxine  88 mcg Oral QAC breakfast   megestrol  400 mg Oral Daily   memantine  5 mg Oral BID   multivitamin with minerals  1 tablet Oral Daily   pantoprazole  40 mg Oral Daily   polyethylene glycol  17 g Oral Daily   QUEtiapine  25 mg Oral QHS   rivastigmine  3 mg Oral BID   senna-docusate  1 tablet Oral BID   sertraline  25 mg Oral Daily   sodium chloride flush  3 mL Intravenous Q12H   [START ON 07/18/2022] Vitamin D  (Ergocalciferol)  50,000 Units Oral Q7 days   Continuous Infusions:     LOS: 1 day     Tresa Moore, MD [facility]   If 7PM-7AM, please contact night-coverage  07/16/2022, 11:25 AM

## 2022-07-16 NOTE — Plan of Care (Signed)

## 2022-07-17 DIAGNOSIS — R296 Repeated falls: Secondary | ICD-10-CM | POA: Diagnosis not present

## 2022-07-17 LAB — URINE CULTURE

## 2022-07-17 LAB — CULTURE, BLOOD (ROUTINE X 2): Culture: NO GROWTH

## 2022-07-17 MED ORDER — CIPROFLOXACIN HCL 500 MG PO TABS
500.0000 mg | ORAL_TABLET | Freq: Two times a day (BID) | ORAL | Status: AC
  Administered 2022-07-17 – 2022-07-18 (×4): 500 mg via ORAL
  Filled 2022-07-17 (×4): qty 1

## 2022-07-17 NOTE — Progress Notes (Signed)
PROGRESS NOTE    Lisa Martin  MWU:[id number] DOB: 1931/04/20 DOA: 07/14/2022 PCP: Danella Penton, MD    Brief Narrative:  89 y.o. female with medical history significant of dementia and other diagnoses as listed below.  Patient is a resident of a nursing home.  Patient had an unwitnessed fall earlier this a.m.  And subsequently brought into evaluation at [facility] ER.  Patient does not remember events of the day.   Patient unable to provide history.  Imaging survey revealed pelvic fracture.  Orthopedic consulted with likely plans for nonoperative management.  Patient also has grossly infected urine   Assessment & Plan:   Principal Problem:   Falls frequently Active Problems:   Fall at home, initial encounter   Leukocytosis   Pelvic fracture (HCC)  Status post fall Pelvic fracture Fall was unwitnessed.  Patient unable to provide history in setting of dementia.  Unable to exclude syncopal event but suspect mechanical fall. Plan: Pain control PT and OT Orthopedic follow-up, nonsurgical management Stable for discharge  Urinary tract infection Urinalysis positive for nitrites and leukocyte.  Initial urine culture was read as negative however now showing greater than 100,000 colony-forming units of E. coli.  Pansensitive. Plan: Restarted antibiotics.  Plan for 3-day total course.  Received 1 day of Rocephin.  Will start 2 days of p.o. ciprofloxacin  Dementia, Alzheimer's type Decreased appetite Depression No acute issues Continue Namenda and Megace Continue nightly Seroquel Continue daily Zoloft  Essential hypertension PTA Norvasc  Hypothyroid PTA Synthroid  DVT prophylaxis: lovenox Code Status: DNR Family Communication: Daughter Blenda Bridegroom [phone] on 6/14, left voicemail on 6/16 Disposition Plan: Status is: Inpatient Remains inpatient appropriate because: Intractable pain.  Decreased ability to ambulate.     Level of care: Med-Surg  Consultants:   Orthopedics  Procedures:  None  Antimicrobials: Ciprofloxacin   Subjective: Seen and examined.  Unable to provide history.  No visible distress  Objective: Vitals:   07/16/22 0843 07/16/22 1757 07/17/22 0008 07/17/22 0738  BP: (!) 150/73 (!) 145/72 (!) 156/95 (!) 146/84  Pulse: 88 79 79 79  Resp: 16 16 20 16   Temp: 97.7 F (36.5 C) 99.2 F (37.3 C) 98.4 F (36.9 C) 98.6 F (37 C)  TempSrc:      SpO2: 97% 98% 97% 96%  Weight:      Height:        Intake/Output Summary (Last 24 hours) at 07/17/2022 1141 Last data filed at 07/17/2022 4742 Gross per 24 hour  Intake 237 ml  Output 950 ml  Net -713 ml   Filed Weights   07/14/22 1702  Weight: 45.8 kg    Examination:  General exam: NAD.  Appears frail Respiratory system: Lungs clear.  Normal work of breathing.  Room air Cardiovascular system: S1-S2 2, RRR, 2/6 murmur, no edema Gastrointestinal system: Thin, soft, NT/ND, normal bowel sounds Central nervous system: Unable to assess orientation Extremities: Symmetric decreased power Skin: Thin and pale with no obvious rashes or lesions Psychiatry: Unable to assess    Data Reviewed: I have personally reviewed following labs and imaging studies  CBC: Recent Labs  Lab 07/14/22 1706 07/14/22 2333 07/15/22 0434 07/16/22 1003  WBC 13.8*  13.7* 17.4* 13.9* 12.9*  NEUTROABS 11.1*  --   --  10.3*  HGB 10.8*  10.8* 10.7* 9.4* 9.6*  HCT 32.3*  32.1* 30.9* 27.6* 27.7*  MCV 86.4  84.9 82.8 84.4 83.4  PLT 472*  460* 397 340 332   Basic Metabolic Panel:  Recent Labs  Lab 07/14/22 1706 07/14/22 2333 07/15/22 0434 07/16/22 1003  NA 137  --  136 139  K 4.2  --  4.3 4.1  CL 107  --  110 109  CO2 20*  --  21* 19*  GLUCOSE 156*  --  108* 100*  BUN 23  --  21 23  CREATININE 0.90 0.73 0.73 0.77  CALCIUM 8.6*  --  8.2* 8.2*   GFR: Estimated Creatinine Clearance: 35.1 mL/min (by C-G formula based on SCr of 0.77 mg/dL). Liver Function Tests: Recent Labs  Lab  07/14/22 1706  AST 23  ALT 19  ALKPHOS 120  BILITOT 0.7  PROT 7.2  ALBUMIN 3.2*   No results for input(s): "LIPASE", "AMYLASE" in the last 168 hours. No results for input(s): "AMMONIA" in the last 168 hours. Coagulation Profile: Recent Labs  Lab 07/15/22 0434  INR 1.3*   Cardiac Enzymes: No results for input(s): "CKTOTAL", "CKMB", "CKMBINDEX", "TROPONINI" in the last 168 hours. BNP (last 3 results) No results for input(s): "PROBNP" in the last 8760 hours. HbA1C: No results for input(s): "HGBA1C" in the last 72 hours. CBG: No results for input(s): "GLUCAP" in the last 168 hours. Lipid Profile: No results for input(s): "CHOL", "HDL", "LDLCALC", "TRIG", "CHOLHDL", "LDLDIRECT" in the last 72 hours. Thyroid Function Tests: Recent Labs    07/15/22 0434  TSH 12.200*  T4TOTAL 5.8   Anemia Panel: No results for input(s): "VITAMINB12", "FOLATE", "FERRITIN", "TIBC", "IRON", "RETICCTPCT" in the last 72 hours. Sepsis Labs: No results for input(s): "PROCALCITON", "LATICACIDVEN" in the last 168 hours.  Recent Results (from the past 240 hour(s))  Culture, blood (Routine X 2) w Reflex to ID Panel     Status: None (Preliminary result)   Collection Time: 07/14/22 11:33 PM   Specimen: BLOOD  Result Value Ref Range Status   Specimen Description BLOOD BLOOD RIGHT ARM  Final   Special Requests   Final    BOTTLES DRAWN AEROBIC AND ANAEROBIC Blood Culture results may not be optimal due to an excessive volume of blood received in culture bottles   Culture   Final    NO GROWTH 3 DAYS Performed at [facility], [address] [place], [place] [address]    Report Status PENDING  Incomplete  Culture, blood (Routine X 2) w Reflex to ID Panel     Status: None (Preliminary result)   Collection Time: 07/14/22 11:33 PM   Specimen: BLOOD  Result Value Ref Range Status   Specimen Description BLOOD BLOOD RIGHT ARM  Final   Special Requests   Final    BOTTLES DRAWN AEROBIC AND ANAEROBIC  Blood Culture results may not be optimal due to an excessive volume of blood received in culture bottles   Culture   Final    NO GROWTH 3 DAYS Performed at [facility], [address] [place], [place] [address]    Report Status PENDING  Incomplete  Urine Culture     Status: Abnormal   Collection Time: 07/15/22  1:42 AM   Specimen: Urine, Random  Result Value Ref Range Status   Specimen Description   Final    URINE, RANDOM Performed at [facility], [address] [place], [place] [address]    Special Requests   Final    NONE Performed at [facility], [address], [place], [place] [address]    Culture >=100,000 COLONIES/mL ESCHERICHIA COLI (A)  Final   Report Status 07/17/2022 FINAL  Final  Organism ID, Bacteria ESCHERICHIA COLI (A)  Final      Susceptibility   Escherichia coli - MIC*    AMPICILLIN 4 SENSITIVE Sensitive     CEFAZOLIN <=4 SENSITIVE Sensitive     CEFEPIME <=0.12 SENSITIVE Sensitive     CEFTRIAXONE <=0.25 SENSITIVE Sensitive     CIPROFLOXACIN <=0.25 SENSITIVE Sensitive     GENTAMICIN <=1 SENSITIVE Sensitive     IMIPENEM <=0.25 SENSITIVE Sensitive     NITROFURANTOIN <=16 SENSITIVE Sensitive     TRIMETH/SULFA <=20 SENSITIVE Sensitive     AMPICILLIN/SULBACTAM <=2 SENSITIVE Sensitive     PIP/TAZO <=4 SENSITIVE Sensitive     * >=100,000 COLONIES/mL ESCHERICHIA COLI  MRSA Next Gen by PCR, Nasal     Status: None   Collection Time: 07/15/22 12:55 PM   Specimen: Nasal Mucosa; Nasal Swab  Result Value Ref Range Status   MRSA by PCR Next Gen NOT DETECTED NOT DETECTED Final    Comment: (NOTE) The GeneXpert MRSA Assay (FDA approved for NASAL specimens only), is one component of a comprehensive MRSA colonization surveillance program. It is not intended to diagnose MRSA infection nor to guide or monitor treatment for MRSA infections. Test performance is not FDA approved in patients less than 65 years old. Performed at [facility], [address], [place], [place] [address]          Radiology Studies: No results found.      Scheduled Meds:  acetaminophen  650 mg Oral Q4H   amLODipine  5 mg Oral Daily   ciprofloxacin  500 mg Oral BID   cyanocobalamin  1,000 mcg Oral Daily   enoxaparin (LOVENOX) injection  40 mg Subcutaneous Q24H   feeding supplement  237 mL Oral TID BM   levothyroxine  88 mcg Oral QAC breakfast   megestrol  400 mg Oral Daily   memantine  5 mg Oral BID   multivitamin with minerals  1 tablet Oral Daily   pantoprazole  40 mg Oral Daily   polyethylene glycol  17 g Oral Daily   QUEtiapine  25 mg Oral QHS   rivastigmine  3 mg Oral BID   senna-docusate  1 tablet Oral BID   sertraline  25 mg Oral Daily   sodium chloride flush  3 mL Intravenous Q12H   [START ON 07/18/2022] Vitamin D (Ergocalciferol)  50,000 Units Oral Q7 days   Continuous Infusions:     LOS: 2 days     Tresa Moore, MD [facility]   If 7PM-7AM, please contact night-coverage  07/17/2022, 11:41 AM

## 2022-07-17 NOTE — Plan of Care (Signed)
  Problem: Education: Goal: Knowledge of General Education information will improve Description: Including pain rating scale, medication(s)/side effects and non-pharmacologic comfort measures Outcome: Not Progressing   Problem: Health Behavior/Discharge Planning: Goal: Ability to manage health-related needs will improve Outcome: Progressing   Problem: Clinical Measurements: Goal: Respiratory complications will improve Outcome: Progressing Goal: Cardiovascular complication will be avoided Outcome: Progressing

## 2022-07-17 NOTE — [place] FL2 (Signed)
[place] MEDICAID FL2 LEVEL OF CARE FORM     IDENTIFICATION  Patient Name: Lisa Martin Birthdate: 1931/02/19 Sex: female Admission Date (Current Location): 07/14/2022  [facility] and [place] Number:  Chiropodist and Address:  [facility], [address] [place], [place] [address]      Provider Number: [id number]  Attending Physician Name and Address:  Tresa Moore, MD  Relative Name and Phone Number:  Blenda Bridegroom [phone]    Current Level of Care: Hospital Recommended Level of Care: Skilled Nursing Facility Prior Approval Number:    Date Approved/Denied:   PASRR Number: [id number]  Discharge Plan: SNF    Current Diagnoses: Patient Active Problem List   Diagnosis Date Noted   Pelvic fracture (HCC) 07/14/2022   Falls frequently 07/14/2022   Intertrochanteric fracture of right hip, closed, initial encounter (HCC) 05/23/2022   Closed right hip fracture (HCC) 05/22/2022   Fall at home, initial encounter 05/22/2022   Leukocytosis 05/22/2022   Right rib fracture 05/22/2022   Chronic diastolic CHF (congestive heart failure) (HCC) 05/22/2022   Dementia (HCC) 05/22/2022   Depression with anxiety 05/22/2022   Rash 04/05/2021   Compression fracture of body of thoracic vertebra (HCC) 04/05/2021   Protein-calorie malnutrition, severe (HCC) 04/05/2021   Acute metabolic encephalopathy 03/27/2021   Severe sepsis (HCC) 03/27/2021   Community acquired pneumonia 03/27/2021   Incidental finding of COVID-19 virus infection 03/27/2021   Dehydration 03/27/2021   Acute prerenal azotemia 03/27/2021   Personal history of malignant neoplasm of breast 05/08/2013   Hypertension 2010   GERD (gastroesophageal reflux disease) 2010   Acquired hypothyroidism 2010    Orientation RESPIRATION BLADDER Height & Weight     Self, Place  Normal (Room Air) External catheter Weight: 100 lb 14.4 oz (45.8 kg) Height:  5\' 2"  (157.5 cm)  BEHAVIORAL  SYMPTOMS/MOOD NEUROLOGICAL BOWEL NUTRITION STATUS      Incontinent    AMBULATORY STATUS COMMUNICATION OF NEEDS Skin   Extensive Assist Verbally Other (Comment) Biomedical scientist)                       Personal Care Assistance Level of Assistance  Bathing, Dressing, Feeding Bathing Assistance: Maximum assistance Feeding assistance: Limited assistance Dressing Assistance: Maximum assistance Total Care Assistance: Maximum assistance   Functional Limitations Info  Sight, Hearing, Speech Sight Info: Adequate Hearing Info: Adequate Speech Info: Adequate    SPECIAL CARE FACTORS FREQUENCY  PT (By licensed PT), OT (By licensed OT)     PT Frequency: 5X Week OT Frequency: 5X Week            Contractures Contractures Info: Not present    Additional Factors Info  Code Status, Allergies Code Status Info: DNR Allergies Info: Atorvastatin           Current Medications (07/17/2022):  This is the current hospital active medication list Current Facility-Administered Medications  Medication Dose Route Frequency Provider Last Rate Last Admin   acetaminophen (TYLENOL) tablet 650 mg  650 mg Oral Q4H Nolberto Hanlon, MD   650 mg at 07/17/22 1202   amLODipine (NORVASC) tablet 5 mg  5 mg Oral Daily Lolita Patella B, MD   5 mg at 07/17/22 1003   ciprofloxacin (CIPRO) tablet 500 mg  500 mg Oral BID Lolita Patella B, MD   500 mg at 07/17/22 1202   cyanocobalamin (VITAMIN B12) tablet 1,000 mcg  1,000 mcg Oral Daily Sreenath, Sudheer B, MD   1,000 mcg at  07/17/22 1004   enoxaparin (LOVENOX) injection 40 mg  40 mg Subcutaneous Q24H Nolberto Hanlon, MD   40 mg at 07/17/22 1004   feeding supplement (ENSURE ENLIVE / ENSURE PLUS) liquid 237 mL  237 mL Oral TID BM Georgeann Oppenheim, Sudheer B, MD   237 mL at 07/17/22 1004   levothyroxine (SYNTHROID) tablet 88 mcg  88 mcg Oral QAC breakfast Lolita Patella B, MD   88 mcg at 07/17/22 0504   megestrol (MEGACE) 400 MG/10ML suspension 400 mg  400 mg Oral Daily  Lolita Patella B, MD   400 mg at 07/17/22 1004   memantine (NAMENDA) tablet 5 mg  5 mg Oral BID Lolita Patella B, MD   5 mg at 07/17/22 1003   multivitamin with minerals tablet 1 tablet  1 tablet Oral Daily Lolita Patella B, MD   1 tablet at 07/17/22 1002   oxyCODONE (Oxy IR/ROXICODONE) immediate release tablet 5-10 mg  5-10 mg Oral Q4H PRN Lolita Patella B, MD   5 mg at 07/16/22 2223   pantoprazole (PROTONIX) EC tablet 40 mg  40 mg Oral Daily Sreenath, Sudheer B, MD   40 mg at 07/17/22 1003   polyethylene glycol (MIRALAX / GLYCOLAX) packet 17 g  17 g Oral Daily Nolberto Hanlon, MD   17 g at 07/15/22 1005   polyethylene glycol (MIRALAX / GLYCOLAX) packet 17 g  17 g Oral Daily PRN Lolita Patella B, MD       QUEtiapine (SEROQUEL) tablet 25 mg  25 mg Oral QHS Sreenath, Sudheer B, MD   25 mg at 07/16/22 2224   rivastigmine (EXELON) capsule 3 mg  3 mg Oral BID Lolita Patella B, MD   3 mg at 07/17/22 1004   senna-docusate (Senokot-S) tablet 1 tablet  1 tablet Oral BID Lolita Patella B, MD   1 tablet at 07/17/22 1003   sertraline (ZOLOFT) tablet 25 mg  25 mg Oral Daily Sreenath, Sudheer B, MD   25 mg at 07/17/22 1003   sodium chloride flush (NS) 0.9 % injection 3 mL  3 mL Intravenous Q12H Nolberto Hanlon, MD   3 mL at 07/17/22 1005   [START ON 07/18/2022] Vitamin D (Ergocalciferol) (DRISDOL) 1.25 MG (50000 UNIT) capsule 50,000 Units  50,000 Units Oral Q7 days Tresa Moore, MD         Discharge Medications: Please see discharge summary for a list of discharge medications.  Relevant Imaging Results:  Relevant Lab Results:   Additional Information [id number]  Colette Ribas, LCSWA

## 2022-07-17 NOTE — Evaluation (Signed)
Physical Therapy Evaluation Patient Details Name: Lisa Martin MRN: [id number] DOB: Feb 08, 1931 Today's Date: 07/17/2022  History of Present Illness  Pt is an 89 y/o F admitted on 07/14/22 following an unwitnessed fall. Imaging revealed pelvic fx & orthopedics recommending non operative management, WBAT RLE. PMH: dementia, R hip IM nail 05/23/22, breast CA, obesity, thyroid disease  Clinical Impression  Pt seen for PT evaluation with pt received in bed, oriented to self (name & birthday only, unable to recall correct age). Pt with documented hx of dementia & poor ability to follow simple commands throughout session. Pt requires max assist for bed mobility, mod<>max assist +2 for STS & step pivot bed>recliner. Pt with c/o pain in R hip during standing/weight bearing & also anxious with movement. Recommend ongoing PT treatment to address strengthening, balance, & endurance to increase independence with bed mobility, transfers, & gait.       Recommendations for follow up therapy are one component of a multi-disciplinary discharge planning process, led by the attending physician.  Recommendations may be updated based on patient status, additional functional criteria and insurance authorization.  Follow Up Recommendations Can patient physically be transported by private vehicle: No     Assistance Recommended at Discharge Frequent or constant Supervision/Assistance  Patient can return home with the following  A lot of help with walking and/or transfers;A lot of help with bathing/dressing/bathroom;Assist for transportation;Help with stairs or ramp for entrance;Assistance with cooking/housework    Equipment Recommendations None recommended by PT (TBD in next venue)  Recommendations for Other Services       Functional Status Assessment Patient has had a recent decline in their functional status and demonstrates the ability to make significant improvements in function in a reasonable and predictable  amount of time.     Precautions / Restrictions Precautions Precautions: Fall Restrictions Weight Bearing Restrictions: Yes RLE Weight Bearing: Weight bearing as tolerated      Mobility  Bed Mobility Overal bed mobility: Needs Assistance Bed Mobility: Supine to Sit     Supine to sit: Max assist, HOB elevated          Transfers Overall transfer level: Needs assistance Equipment used: 2 person hand held assist, Rolling walker (2 wheels) Transfers: Sit to/from Stand, Bed to chair/wheelchair/BSC Sit to Stand: Mod assist, Max assist, +2 physical assistance, +2 safety/equipment   Step pivot transfers: Max assist, +2 physical assistance       General transfer comment: Pt transfers STS x ~5 times with RW x first 2-3 times then with +2 HHA. Pt only initiates STS x 1-2 times. Pt transferred bed>recliner via step pivot with BUE HHA.    Ambulation/Gait                  Stairs            Wheelchair Mobility    Modified Rankin (Stroke Patients Only)       Balance Overall balance assessment: Needs assistance Sitting-balance support: Feet supported Sitting balance-Leahy Scale: Fair Sitting balance - Comments: supervision static sitting   Standing balance support: During functional activity, Bilateral upper extremity supported, Reliant on assistive device for balance Standing balance-Leahy Scale: Poor                               Pertinent Vitals/Pain Pain Assessment Pain Assessment: Faces Faces Pain Scale: Hurts even more Pain Location: R hip Pain Descriptors / Indicators: Grimacing, Guarding, Discomfort Pain Intervention(s): Limited  activity within patient's tolerance, Repositioned, Monitored during session    Home Living Family/patient expects to be discharged to:: Skilled nursing facility                        Prior Function Prior Level of Function : Patient poor historian/Family not available             Mobility  Comments: Per chart, pt was minimally ambulatory with RW after IM nail in April 2024.       Hand Dominance        Extremity/Trunk Assessment        Lower Extremity Assessment Lower Extremity Assessment: Difficult to assess due to impaired cognition;Generalized weakness    Cervical / Trunk Assessment Cervical / Trunk Assessment: Kyphotic  Communication   Communication: HOH  Cognition Arousal/Alertness: Awake/alert Behavior During Therapy: WFL for tasks assessed/performed Overall Cognitive Status: History of cognitive impairments - at baseline                                 General Comments: hx of dementia, oriented to self (name & birthday, but not age). Follows simple commands inconsistently with extra time & multimodal cuing. Anxious with mobility.        General Comments General comments (skin integrity, edema, etc.): Pt incontinent of void & BM, requires assistance for peri hygiene and to change into clean gown.    Exercises     Assessment/Plan    PT Assessment Patient needs continued PT services  PT Problem List Decreased strength;Pain;Decreased activity tolerance;Decreased range of motion;Decreased knowledge of use of DME;Decreased safety awareness;Decreased balance;Decreased mobility;Decreased knowledge of precautions;Decreased cognition       PT Treatment Interventions Gait training;Balance training;Therapeutic exercise;DME instruction;Stair training;Neuromuscular re-education;Therapeutic activities;Patient/family education;Functional mobility training;Modalities    PT Goals (Current goals can be found in the Care Plan section)  Acute Rehab PT Goals Patient Stated Goal: decreased pain PT Goal Formulation: With patient Time For Goal Achievement: 07/31/22 Potential to Achieve Goals: Fair    Frequency Min 3X/week     Co-evaluation               AM-PAC PT "6 Clicks" Mobility  Outcome Measure Help needed turning from your back to your  side while in a flat bed without using bedrails?: A Lot Help needed moving from lying on your back to sitting on the side of a flat bed without using bedrails?: Total Help needed moving to and from a bed to a chair (including a wheelchair)?: Total Help needed standing up from a chair using your arms (e.g., wheelchair or bedside chair)?: A Lot Help needed to walk in hospital room?: Total Help needed climbing 3-5 steps with a railing? : Total 6 Click Score: 8    End of Session   Activity Tolerance: Patient limited by pain (limited 2/2 impaired cognition) Patient left: in chair;with chair alarm set;with call bell/phone within reach;with nursing/sitter in room Nurse Communication: Mobility status PT Visit Diagnosis: Muscle weakness (generalized) (M62.81);Pain;Difficulty in walking, not elsewhere classified (R26.2);Other abnormalities of gait and mobility (R26.89);Unsteadiness on feet (R26.81) Pain - Right/Left: Right Pain - part of body: Hip    Time: 1130-1146 PT Time Calculation (min) (ACUTE ONLY): 16 min   Charges:   PT Evaluation $PT Eval Moderate Complexity: 1 Mod          Aleda Grana, PT, DPT 07/17/22, 12:09 PM   Sandi Mariscal  07/17/2022, 12:07 PM

## 2022-07-18 DIAGNOSIS — R296 Repeated falls: Secondary | ICD-10-CM | POA: Diagnosis not present

## 2022-07-18 LAB — CULTURE, BLOOD (ROUTINE X 2)

## 2022-07-18 MED ORDER — OXYCODONE HCL 5 MG PO TABS
2.5000 mg | ORAL_TABLET | ORAL | Status: DC | PRN
  Administered 2022-07-19: 5 mg via ORAL
  Filled 2022-07-18: qty 1

## 2022-07-18 NOTE — Progress Notes (Signed)
Physical Therapy Treatment Patient Details Name: Lisa Martin MRN: [id number] DOB: 07/18/1931 Today's Date: 07/18/2022   History of Present Illness Pt is an 89 y/o F admitted on 07/14/22 following an unwitnessed fall. Imaging revealed pelvic fx & orthopedics recommending non operative management, WBAT RLE. PMH: dementia, R hip IM nail 05/23/22, breast CA, obesity, thyroid disease    PT Comments    Pt lying in bed upon arrival with nursing in room. Pt oriented only to self, able to provide full name and birth day/month but incorrect year. She was able to initiate some movement to start supine > sit with significant verbal and tactile cues, but ultimately needed max A to come to sitting EOB. She demonstrated a slight posterior lean while sitting upright, which worsened significantly upon trying to stand. Pt unable to attain full standing position even with max A + 2 HHA. Pt unable to initiate steps even with significant cueing. She demonstrated heightened anxiety with any movement. Pt will benefit from continued PT services upon discharge to safely address deficits listed in patient problem list for decreased caregiver assistance and eventual return to PLOF.   Recommendations for follow up therapy are one component of a multi-disciplinary discharge planning process, led by the attending physician.  Recommendations may be updated based on patient status, additional functional criteria and insurance authorization.  Follow Up Recommendations  Can patient physically be transported by private vehicle: No    Assistance Recommended at Discharge Frequent or constant Supervision/Assistance  Patient can return home with the following A lot of help with walking and/or transfers;A lot of help with bathing/dressing/bathroom;Assist for transportation;Help with stairs or ramp for entrance;Assistance with cooking/housework   Equipment Recommendations  Other (comment) (TBD)    Recommendations for Other Services        Precautions / Restrictions Precautions Precautions: Fall Restrictions Weight Bearing Restrictions: Yes RLE Weight Bearing: Weight bearing as tolerated     Mobility  Bed Mobility Overal bed mobility: Needs Assistance Bed Mobility: Supine to Sit     Supine to sit: Max assist, HOB elevated Sit to supine: Max assist, +2 for physical assistance   General bed mobility comments: Pt able to initiate some leg and arm movement to start supine>sit but needed max A to get to sitting completely EOB. Max A + 2 for sit > supine for UE/LE placement.    Transfers Overall transfer level: Needs assistance Equipment used: 2 person hand held assist Transfers: Sit to/from Stand Sit to Stand: +2 physical assistance, Max assist           General transfer comment: Max A + 2 person HHA for elevation for STS.    Ambulation/Gait               General Gait Details: Pt unable to initiate steps even with significant cueing.    Stairs             Wheelchair Mobility    Modified Rankin (Stroke Patients Only)       Balance Overall balance assessment: Needs assistance Sitting-balance support: Feet supported Sitting balance-Leahy Scale: Fair Sitting balance - Comments: Slight posterior lean upon sitting EOB but able to tolerate sitting upright with no UE support or therapist assistance for a few seconds. Postural control: Posterior lean Standing balance support: Bilateral upper extremity supported, During functional activity, Reliant on assistive device for balance Standing balance-Leahy Scale: Zero Standing balance comment: Pt unable to stand fully upright, heavy posterior lean upon trying to stand.  Cognition Arousal/Alertness: Awake/alert Behavior During Therapy: WFL for tasks assessed/performed, Anxious Overall Cognitive Status: History of cognitive impairments - at baseline                                 General  Comments: hx of dementia, oriented to self, follows simple commands inconsistently with extra time & multimodal cuing. Anxious with mobility.        Exercises      General Comments        Pertinent Vitals/Pain Pain Assessment Faces Pain Scale: Hurts little more Pain Location: R hip Pain Descriptors / Indicators: Grimacing, Guarding, Discomfort Pain Intervention(s): Monitored during session, Repositioned    Home Living                          Prior Function            PT Goals (current goals can now be found in the care plan section) Progress towards PT goals: Progressing toward goals    Frequency    Min 3X/week      PT Plan Current plan remains appropriate    Co-evaluation              AM-PAC PT "6 Clicks" Mobility   Outcome Measure  Help needed turning from your back to your side while in a flat bed without using bedrails?: A Lot Help needed moving from lying on your back to sitting on the side of a flat bed without using bedrails?: Total Help needed moving to and from a bed to a chair (including a wheelchair)?: Total Help needed standing up from a chair using your arms (e.g., wheelchair or bedside chair)?: A Lot Help needed to walk in hospital room?: Total Help needed climbing 3-5 steps with a railing? : Total 6 Click Score: 8    End of Session   Activity Tolerance: Patient limited by pain;Treatment limited secondary to medical complications (Comment) (Dementia and confusion) Patient left: in bed;with call bell/phone within reach;with bed alarm set Nurse Communication: Mobility status PT Visit Diagnosis: Muscle weakness (generalized) (M62.81);Pain;Difficulty in walking, not elsewhere classified (R26.2);Other abnormalities of gait and mobility (R26.89);Unsteadiness on feet (R26.81) Pain - Right/Left: Right Pain - part of body: Hip     Time: 1634-1700 PT Time Calculation (min) (ACUTE ONLY): 26 min  Charges:                         Kijana Cromie, SPT 07/18/22, 5:22 PM

## 2022-07-18 NOTE — Care Management Important Message (Signed)
Important Message  Patient Details  Name: Lisa Martin MRN: [id number] Date of Birth: [birth year]   Medicare Important Message Given:  Yes  Patient was sleeping so unable to obtain her signature but left a copy on her bedside table to review. Will try again tomorrow.   Olegario Messier A Makalynn Berwanger 07/18/2022, 3:13 PM

## 2022-07-18 NOTE — Progress Notes (Signed)
PROGRESS NOTE    Lisa Martin  UJW:[id number] DOB: 1931-04-05 DOA: 07/14/2022 PCP: Danella Penton, MD    Brief Narrative:  89 y.o. female with medical history significant of dementia and other diagnoses as listed below.  Patient is a resident of a nursing home.  Patient had an unwitnessed fall earlier this a.m.  And subsequently brought into evaluation at [facility] ER.  Patient does not remember events of the day.   Patient unable to provide history.  Imaging survey revealed pelvic fracture.  Orthopedic consulted with likely plans for nonoperative management.  Patient also has grossly infected urine   Assessment & Plan:   Principal Problem:   Falls frequently Active Problems:   Fall at home, initial encounter   Leukocytosis   Pelvic fracture (HCC)  Status post fall Pelvic fracture Fall was unwitnessed.  Patient unable to provide history in setting of dementia.  Unable to exclude syncopal event but suspect mechanical fall. Plan: Pain control PT and OT Orthopedic follow-up, nonsurgical management Medically appropriate for dc to SNF  Urinary tract infection Urinalysis positive for nitrites and leukocyte.  Initial urine culture was read as negative however now showing greater than 100,000 colony-forming units of E. coli.  Pansensitive. Plan: Restarted antibiotics.  Plan for 3-day total course.  Received 1 day of Rocephin.  Today is last day of abx.  Stop after second dose  Dementia, Alzheimer's type Decreased appetite Depression No acute issues Continue Namenda and Megace Continue nightly Seroquel Continue daily Zoloft  Essential hypertension PTA Norvasc  Hypothyroid PTA Synthroid  DVT prophylaxis: lovenox Code Status: DNR Family Communication: Daughter Blenda Bridegroom [phone] on 6/14, left voicemail on 6/16, via phone 6/17 Disposition Plan: Status is: Inpatient Remains inpatient appropriate because: Intractable pain.  Decreased ability to ambulate.     Level of  care: Med-Surg  Consultants:  Orthopedics  Procedures:  None  Antimicrobials: Ciprofloxacin   Subjective: Seen and examined.  Unable to provide history.  No visible distress.  Appears comfortable  Objective: Vitals:   07/17/22 1523 07/18/22 0022 07/18/22 0746 07/18/22 0815  BP:  (!) 142/98 (!) 150/86 134/77  Pulse: 83 92 79 71  Resp: 16 20 16 19   Temp: 97.8 F (36.6 C) 98.4 F (36.9 C) 98 F (36.7 C)   TempSrc:   Oral   SpO2: 93% 95% 97% 98%  Weight:      Height:        Intake/Output Summary (Last 24 hours) at 07/18/2022 1421 Last data filed at 07/18/2022 1044 Gross per 24 hour  Intake 837 ml  Output 1000 ml  Net -163 ml   Filed Weights   07/14/22 1702  Weight: 45.8 kg    Examination:  General exam: NAD.Frail appearing Respiratory system: Lungs clear.  Normal work of breathing.  Room air Cardiovascular system: S1-S2 2, RRR, no edema Gastrointestinal system: Thin, soft, NT/ND, normal bowel sounds Central nervous system: Unable to assess orientation Extremities: Symmetric decreased power Skin: Thin and pale with no obvious rashes or lesions Psychiatry: Unable to assess    Data Reviewed: I have personally reviewed following labs and imaging studies  CBC: Recent Labs  Lab 07/14/22 1706 07/14/22 2333 07/15/22 0434 07/16/22 1003  WBC 13.8*  13.7* 17.4* 13.9* 12.9*  NEUTROABS 11.1*  --   --  10.3*  HGB 10.8*  10.8* 10.7* 9.4* 9.6*  HCT 32.3*  32.1* 30.9* 27.6* 27.7*  MCV 86.4  84.9 82.8 84.4 83.4  PLT 472*  460* 397 340 332  Basic Metabolic Panel: Recent Labs  Lab 07/14/22 1706 07/14/22 2333 07/15/22 0434 07/16/22 1003  NA 137  --  136 139  K 4.2  --  4.3 4.1  CL 107  --  110 109  CO2 20*  --  21* 19*  GLUCOSE 156*  --  108* 100*  BUN 23  --  21 23  CREATININE 0.90 0.73 0.73 0.77  CALCIUM 8.6*  --  8.2* 8.2*   GFR: Estimated Creatinine Clearance: 35.1 mL/min (by C-G formula based on SCr of 0.77 mg/dL). Liver Function  Tests: Recent Labs  Lab 07/14/22 1706  AST 23  ALT 19  ALKPHOS 120  BILITOT 0.7  PROT 7.2  ALBUMIN 3.2*   No results for input(s): "LIPASE", "AMYLASE" in the last 168 hours. No results for input(s): "AMMONIA" in the last 168 hours. Coagulation Profile: Recent Labs  Lab 07/15/22 0434  INR 1.3*   Cardiac Enzymes: No results for input(s): "CKTOTAL", "CKMB", "CKMBINDEX", "TROPONINI" in the last 168 hours. BNP (last 3 results) No results for input(s): "PROBNP" in the last 8760 hours. HbA1C: No results for input(s): "HGBA1C" in the last 72 hours. CBG: No results for input(s): "GLUCAP" in the last 168 hours. Lipid Profile: No results for input(s): "CHOL", "HDL", "LDLCALC", "TRIG", "CHOLHDL", "LDLDIRECT" in the last 72 hours. Thyroid Function Tests: No results for input(s): "TSH", "T4TOTAL", "FREET4", "T3FREE", "THYROIDAB" in the last 72 hours.  Anemia Panel: No results for input(s): "VITAMINB12", "FOLATE", "FERRITIN", "TIBC", "IRON", "RETICCTPCT" in the last 72 hours. Sepsis Labs: No results for input(s): "PROCALCITON", "LATICACIDVEN" in the last 168 hours.  Recent Results (from the past 240 hour(s))  Culture, blood (Routine X 2) w Reflex to ID Panel     Status: None (Preliminary result)   Collection Time: 07/14/22 11:33 PM   Specimen: BLOOD  Result Value Ref Range Status   Specimen Description BLOOD BLOOD RIGHT ARM  Final   Special Requests   Final    BOTTLES DRAWN AEROBIC AND ANAEROBIC Blood Culture results may not be optimal due to an excessive volume of blood received in culture bottles   Culture   Final    NO GROWTH 4 DAYS Performed at [facility], [address] [place], [place] [address]    Report Status PENDING  Incomplete  Culture, blood (Routine X 2) w Reflex to ID Panel     Status: None (Preliminary result)   Collection Time: 07/14/22 11:33 PM   Specimen: BLOOD  Result Value Ref Range Status   Specimen Description BLOOD BLOOD RIGHT ARM  Final    Special Requests   Final    BOTTLES DRAWN AEROBIC AND ANAEROBIC Blood Culture results may not be optimal due to an excessive volume of blood received in culture bottles   Culture   Final    NO GROWTH 4 DAYS Performed at [facility], [address] [place], [place] [address]    Report Status PENDING  Incomplete  Urine Culture     Status: Abnormal   Collection Time: 07/15/22  1:42 AM   Specimen: Urine, Random  Result Value Ref Range Status   Specimen Description   Final    URINE, RANDOM Performed at [facility], [address], [place], [place] [address]    Special Requests   Final    NONE Performed at [facility], [address], [place], [place] [address]    Culture >=100,000 COLONIES/mL ESCHERICHIA COLI (A)  Final   Report Status 07/17/2022 FINAL  Final   Organism ID, Bacteria ESCHERICHIA COLI (A)  Final      Susceptibility   Escherichia coli - MIC*    AMPICILLIN 4 SENSITIVE Sensitive     CEFAZOLIN <=4 SENSITIVE Sensitive     CEFEPIME <=0.12 SENSITIVE Sensitive     CEFTRIAXONE <=0.25 SENSITIVE Sensitive     CIPROFLOXACIN <=0.25 SENSITIVE Sensitive     GENTAMICIN <=1 SENSITIVE Sensitive     IMIPENEM <=0.25 SENSITIVE Sensitive     NITROFURANTOIN <=16 SENSITIVE Sensitive     TRIMETH/SULFA <=20 SENSITIVE Sensitive     AMPICILLIN/SULBACTAM <=2 SENSITIVE Sensitive     PIP/TAZO <=4 SENSITIVE Sensitive     * >=100,000 COLONIES/mL ESCHERICHIA COLI  MRSA Next Gen by PCR, Nasal     Status: None   Collection Time: 07/15/22 12:55 PM   Specimen: Nasal Mucosa; Nasal Swab  Result Value Ref Range Status   MRSA by PCR Next Gen NOT DETECTED NOT DETECTED Final    Comment: (NOTE) The GeneXpert MRSA Assay (FDA approved for NASAL specimens only), is one component of a comprehensive MRSA colonization surveillance program. It is not intended to diagnose MRSA infection nor to guide or monitor treatment for MRSA infections. Test performance is not FDA approved in  patients less than 54 years old. Performed at [facility], [address] [place], [place] [address]          Radiology Studies: No results found.      Scheduled Meds:  amLODipine  5 mg Oral Daily   ciprofloxacin  500 mg Oral BID   cyanocobalamin  1,000 mcg Oral Daily   enoxaparin (LOVENOX) injection  40 mg Subcutaneous Q24H   feeding supplement  237 mL Oral TID BM   levothyroxine  88 mcg Oral QAC breakfast   megestrol  400 mg Oral Daily   memantine  5 mg Oral BID   multivitamin with minerals  1 tablet Oral Daily   pantoprazole  40 mg Oral Daily   polyethylene glycol  17 g Oral Daily   QUEtiapine  25 mg Oral QHS   rivastigmine  3 mg Oral BID   senna-docusate  1 tablet Oral BID   sertraline  25 mg Oral Daily   sodium chloride flush  3 mL Intravenous Q12H   Vitamin D (Ergocalciferol)  50,000 Units Oral Q7 days   Continuous Infusions:     LOS: 3 days     Tresa Moore, MD [facility]   If 7PM-7AM, please contact night-coverage  07/18/2022, 2:21 PM

## 2022-07-18 NOTE — TOC Progression Note (Signed)
Transition of Care [facility]) - Progression Note    Patient Details  Name: Lisa Martin MRN: [id number] Date of Birth: [birth year]  Transition of [facility]) CM/SW Contact  Marlowe Sax, RN Phone Number: 07/18/2022, 11:47 AM  Clinical Narrative:     Patient is a resident for [facility], Spoke with the Daughter [name] The patient is in the memory care unit but will need PT, I called [facility] And spoke with Babette Relic, requested [place] for [facility] and for [facility] Awaiting approval       Expected Discharge Plan and Services                                               Social Determinants of Health (SDOH) Interventions SDOH Screenings   Food Insecurity: No Food Insecurity (07/15/2022)  Housing: Low Risk  (07/15/2022)  Transportation Needs: No Transportation Needs (07/15/2022)  Utilities: Not At Risk (07/15/2022)  Tobacco Use: Medium Risk (07/14/2022)    Readmission Risk Interventions     No data to display

## 2022-07-18 NOTE — Evaluation (Signed)
Occupational Therapy Evaluation Patient Details Name: Lisa Martin MRN: [id number] DOB: Apr 28, 1931 Today's Date: 07/18/2022   History of Present Illness Pt is an 89 y/o F admitted on 07/14/22 following an unwitnessed fall. Imaging revealed pelvic fx & orthopedics recommending non operative management, WBAT RLE. PMH: dementia, R hip IM nail 05/23/22, breast CA, obesity, thyroid disease   Clinical Impression   Pt was seen for OT evaluation this date. Prior to hospital admission, pt was at LTC facility. No caregivers present and pt is poor historian so home set up and PLOF not available. Pt received in the recliner, nursing requesting for therapy to return pt to bed. Pt endorses BLE pain and agreeable to return to bed. Pt presents to acute OT demonstrating impaired ADL performance and functional mobility 2/2 decreased strength, hx cognitive deficits, impaired balance, and BLE pain (See OT problem list for additional functional deficits). Pt currently requires MAX A +2 for STS and SPT to bed (did better with HHA +2 versus RW), MAX A for LB ADL, and MIN-MOD A for UB ADL. Pt would benefit from additional skilled OT services to address noted impairments and functional limitations (see below for any additional details) in order to maximize safety and independence while minimizing falls risk and caregiver burden.     Recommendations for follow up therapy are one component of a multi-disciplinary discharge planning process, led by the attending physician.  Recommendations may be updated based on patient status, additional functional criteria and insurance authorization.   Assistance Recommended at Discharge Frequent or constant Supervision/Assistance  Patient can return home with the following Two people to help with walking and/or transfers;A lot of help with bathing/dressing/bathroom;Assistance with cooking/housework;Assist for transportation;Help with stairs or ramp for entrance;Direct supervision/assist for  financial management;Direct supervision/assist for medications management    Functional Status Assessment  Patient has had a recent decline in their functional status and demonstrates the ability to make significant improvements in function in a reasonable and predictable amount of time.  Equipment Recommendations  Other (comment) (defer to next venue)    Recommendations for Other Services       Precautions / Restrictions Precautions Precautions: Fall Restrictions Weight Bearing Restrictions: Yes RLE Weight Bearing: Weight bearing as tolerated      Mobility Bed Mobility Overal bed mobility: Needs Assistance Bed Mobility: Sit to Supine       Sit to supine: Max assist, +2 for physical assistance        Transfers Overall transfer level: Needs assistance Equipment used: 2 person hand held assist, Rolling walker (2 wheels) Transfers: Sit to/from Stand, Bed to chair/wheelchair/BSC Sit to Stand: Max assist, +2 physical assistance, +2 safety/equipment Stand pivot transfers: Max assist, +2 physical assistance         General transfer comment: VC for sequencing, RW mgt, hand placement, did better with HHA vs RW, very narrow BOS      Balance Overall balance assessment: Needs assistance Sitting-balance support: Feet supported Sitting balance-Leahy Scale: Fair Sitting balance - Comments: SBA for static sitting and for reaching forward to feet   Standing balance support: During functional activity, Bilateral upper extremity supported, Reliant on assistive device for balance Standing balance-Leahy Scale: Poor Standing balance comment: very poor                           ADL either performed or assessed with clinical judgement   ADL  General ADL Comments: Pt unable to doff socks but able to reach to ankles. MAX A for LB ADL, MIN-MOD A for seated UB ADL, MAX A +2 for SPT     Vision         Perception      Praxis      Pertinent Vitals/Pain Pain Assessment Pain Assessment: Faces Faces Pain Scale: Hurts little more Pain Descriptors / Indicators: Grimacing, Guarding, Discomfort Pain Intervention(s): Limited activity within patient's tolerance, Monitored during session, Premedicated before session, Repositioned     Hand Dominance     Extremity/Trunk Assessment Upper Extremity Assessment Upper Extremity Assessment: Difficult to assess due to impaired cognition;Generalized weakness   Lower Extremity Assessment Lower Extremity Assessment: Difficult to assess due to impaired cognition;Generalized weakness   Cervical / Trunk Assessment Cervical / Trunk Assessment: Kyphotic   Communication Communication Communication: HOH   Cognition Arousal/Alertness: Awake/alert Behavior During Therapy: WFL for tasks assessed/performed, Anxious Overall Cognitive Status: History of cognitive impairments - at baseline                                 General Comments: hx of dementia, oriented to self, follows simple commands inconsistently with extra time & multimodal cuing. Anxious with mobility.     General Comments       Exercises     Shoulder Instructions      Home Living Family/patient expects to be discharged to:: Skilled nursing facility                                        Prior Functioning/Environment Prior Level of Function : Patient poor historian/Family not available             Mobility Comments: Per chart, pt was minimally ambulatory with RW after IM nail in April 2024. ADLs Comments: unknown        OT Problem List: Decreased strength;Decreased cognition;Pain;Decreased safety awareness;Impaired balance (sitting and/or standing);Decreased knowledge of use of DME or AE;Decreased activity tolerance      OT Treatment/Interventions: Self-care/ADL training;Therapeutic exercise;Therapeutic activities;DME and/or AE instruction;Cognitive  remediation/compensation;Patient/family education;Balance training    OT Goals(Current goals can be found in the care plan section) Acute Rehab OT Goals Patient Stated Goal: have less pain OT Goal Formulation: With patient Time For Goal Achievement: 08/01/22 Potential to Achieve Goals: Good ADL Goals Pt Will Perform Grooming: sitting;with set-up;with supervision Pt Will Transfer to Toilet: stand pivot transfer;bedside commode;with min assist;with +2 assist (LRAD) Pt Will Perform Toileting - Clothing Manipulation and hygiene: with mod assist;sitting/lateral leans  OT Frequency: Min 1X/week    Co-evaluation              AM-PAC OT "6 Clicks" Daily Activity     Outcome Measure Help from another person eating meals?: None Help from another person taking care of personal grooming?: A Little Help from another person toileting, which includes using toliet, bedpan, or urinal?: Total Help from another person bathing (including washing, rinsing, drying)?: A Lot Help from another person to put on and taking off regular upper body clothing?: A Lot Help from another person to put on and taking off regular lower body clothing?: A Lot 6 Click Score: 14   End of Session Equipment Utilized During Treatment: Rolling walker (2 wheels) Nurse Communication: Mobility status  Activity Tolerance: Patient limited by pain Patient left:  in bed;with call bell/phone within reach;with bed alarm set  OT Visit Diagnosis: Other abnormalities of gait and mobility (R26.89);Muscle weakness (generalized) (M62.81);Pain Pain - Right/Left: Right Pain - part of body: Hip                Time: 4098-1191 OT Time Calculation (min): 13 min Charges:  OT General Charges $OT Visit: 1 Visit OT Evaluation $OT Eval Moderate Complexity: 1 Mod Arman Filter., MPH, MS, OTR/L ascom [phone] 07/18/22, 12:21 PM

## 2022-07-19 DIAGNOSIS — R296 Repeated falls: Secondary | ICD-10-CM | POA: Diagnosis not present

## 2022-07-19 LAB — CULTURE, BLOOD (ROUTINE X 2)

## 2022-07-19 MED ORDER — TRAMADOL HCL 50 MG PO TABS: 50.0000 mg | ORAL_TABLET | Freq: Two times a day (BID) | ORAL | 0 refills | Status: DC | PRN

## 2022-07-19 MED ORDER — ALPRAZOLAM 0.25 MG PO TABS: 0.2500 mg | ORAL_TABLET | Freq: Two times a day (BID) | ORAL | 0 refills | Status: DC | PRN

## 2022-07-19 NOTE — Discharge Summary (Signed)
Physician Discharge Summary  Lisa Martin:[id number] DOB: [birth year] DOA: 07/14/2022  PCP: Danella Penton, MD  Admit date: 07/14/2022 Discharge date: 07/19/2022  Admitted From: LTC Disposition:  LTC/memory care  Recommendations for Outpatient Follow-up:  Follow up with PCP in 1-2 weeks   Home Health:No Equipment/Devices:None   Discharge Condition:Stable CODE STATUS:DNR  Diet recommendation:   Brief/Interim Summary:  89 y.o. female with medical history significant of dementia and other diagnoses as listed below.  Patient is a resident of a nursing home.  Patient had an unwitnessed fall earlier this a.m.  And subsequently brought into evaluation at [facility].  Patient does not remember events of the day.   Patient unable to provide history.  Imaging survey revealed pelvic fracture.  Orthopedic consulted with likely plans for nonoperative management.  Patient also has grossly infected urine   Discharge Diagnoses:  Principal Problem:   Falls frequently Active Problems:   Fall at home, initial encounter   Leukocytosis   Pelvic fracture (HCC)  Status post fall Pelvic fracture Fall was unwitnessed.  Patient unable to provide history in setting of dementia.  Unable to exclude syncopal event but suspect mechanical fall. Plan: Okay for return to memory care unit.  Initially plan was for skilled nursing facility however medical director had declined stating patient is at baseline.  In this setting I am inclined to agree.  Unclear how much recovery potential the patient has.  May be best served with return to long-term care/memory unit.   Urinary tract infection Urinalysis positive for nitrites and leukocyte.  Initial urine culture was read as negative however now showing greater than 100,000 colony-forming units of E. coli.  Pansensitive. Plan: Received 3 total days of antibiotic for uncomplicated cystitis.  No antibiotics indicated on discharge  Dementia, Alzheimer's type Decreased  appetite Depression No acute issues Continue Namenda and Megace Continue nightly Seroquel Continue daily Zoloft   Essential hypertension PTA Norvasc   Hypothyroid PTA Synthroid    Discharge Instructions  Discharge Instructions     Diet - low sodium heart healthy   Complete by: As directed    Increase activity slowly   Complete by: As directed       Allergies as of 07/19/2022       Reactions   Atorvastatin Cough        Medication List     STOP taking these medications    enoxaparin 30 MG/0.3ML injection Commonly known as: LOVENOX       TAKE these medications    acetaminophen 325 MG tablet Commonly known as: TYLENOL Take 1-2 tablets (325-650 mg total) by mouth every 6 (six) hours as needed for mild pain, moderate pain, fever or headache.   ALPRAZolam 0.25 MG tablet Commonly known as: XANAX Take 1 tablet (0.25 mg total) by mouth 2 (two) times daily as needed for anxiety. SNF use only.  Refills per SNF MD What changed: additional instructions   amLODipine 5 MG tablet Commonly known as: NORVASC Take 1 tablet (5 mg total) by mouth daily.   feeding supplement Liqd Take 237 mLs by mouth 3 (three) times daily between meals.   levothyroxine 88 MCG tablet Commonly known as: SYNTHROID Take 88 mcg by mouth daily.   megestrol 400 MG/10ML suspension Commonly known as: MEGACE Take 10 mLs (400 mg total) by mouth daily.   memantine 5 MG tablet Commonly known as: NAMENDA Take 5 mg by mouth 2 (two) times daily.   multivitamin with minerals Tabs tablet Take 1  tablet by mouth daily.   omeprazole 20 MG capsule Commonly known as: PRILOSEC Take 20 mg by mouth daily.   polyethylene glycol 17 g packet Commonly known as: MIRALAX / GLYCOLAX Take 17 g by mouth daily as needed for mild constipation or moderate constipation.   QUEtiapine 25 MG tablet Commonly known as: SEROQUEL Take 25 mg by mouth at bedtime.   rivastigmine 3 MG capsule Commonly known as:  EXELON Take 3 mg by mouth 2 (two) times daily.   senna-docusate 8.6-50 MG tablet Commonly known as: Senokot-S Take 1 tablet by mouth 2 (two) times daily.   sertraline 25 MG tablet Commonly known as: ZOLOFT Take 25 mg by mouth daily.   traMADol 50 MG tablet Commonly known as: ULTRAM Take 1 tablet (50 mg total) by mouth every 12 (twelve) hours as needed for moderate pain. SNF use only.  Refills per SNF MD What changed: additional instructions   vitamin B-12 250 MCG tablet Commonly known as: CYANOCOBALAMIN Take 1,000 mcg by mouth daily.   Vitamin D (Ergocalciferol) 1.25 MG (50000 UNIT) Caps capsule Commonly known as: DRISDOL Take 1 capsule (50,000 Units total) by mouth every 7 (seven) days.        Contact information for after-discharge care     Destination     [facility] Preferred SNF .   Service: Skilled Nursing Contact information: [address] [place] [address] [phone]                    Allergies  Allergen Reactions   Atorvastatin Cough    Consultations: Orthopedics   Procedures/Studies: CT PELVIS WO CONTRAST  Result Date: 07/14/2022 CLINICAL DATA:  Status post fall. EXAM: CT PELVIS WITHOUT CONTRAST TECHNIQUE: Multidetector CT imaging of the pelvis was performed following the standard protocol without intravenous contrast. RADIATION DOSE REDUCTION: This exam was performed according to the departmental dose-optimization program which includes automated exposure control, adjustment of the mA and/or kV according to patient size and/or use of iterative reconstruction technique. COMPARISON:  Jun 24, 2022 FINDINGS: Urinary Tract: No abnormality visualized. The urinary bladder is limited in evaluation secondary to overlying streak artifact, and is otherwise unremarkable. Bowel: There is no evidence of bowel dilatation. Stool is seen throughout the large bowel.  Noninflamed diverticula are noted throughout the sigmoid colon. Vascular/Lymphatic: There is marked severity calcification of the visualized portion of the abdominal aorta and bilateral common iliac arteries, without evidence of aneurysmal dilatation. No abnormal abdominal or pelvic lymph nodes are identified. Reproductive: Several small calcified uterine fibroids are seen. The bilateral adnexa are unremarkable. Other: Tiny gallstones are seen within the visualized portion of the gallbladder lumen. Musculoskeletal: A total left hip replacement is seen. A metallic density intramedullary rod and compression screw device are present within the proximal right femur. There is associated streak artifact with subsequently limited evaluation of the adjacent osseous and soft tissue structures. An acute comminuted fracture deformity is seen involving the inferior aspect of the right iliac crest. This extends to involve the anterior, posterior and medial aspects of the right acetabulum. Additional nondisplaced fractures of the right superior and right inferior pubic rami are noted. A 2.1 cm x 1.0 cm fracture fragment is seen anterior to the right hip. This is present on the prior exam. IMPRESSION: 1. Acute comminuted fracture deformities involving the inferior aspect of the right iliac crest and right acetabulum, as well as the right superior and right  inferior pubic rami. 2. Prior ORIF of the proximal right femur. 3. Intact total left hip replacement. 4. Sigmoid diverticulosis. 5. Cholelithiasis. 6. Aortic atherosclerosis. Aortic Atherosclerosis (ICD10-I70.0). Electronically Signed   By: Aram Candela M.D.   On: 07/14/2022 19:08   CT Cervical Spine Wo Contrast  Result Date: 07/14/2022 CLINICAL DATA:  Trauma, fall EXAM: CT CERVICAL SPINE WITHOUT CONTRAST TECHNIQUE: Multidetector CT imaging of the cervical spine was performed without intravenous contrast. Multiplanar CT image reconstructions were also generated.  RADIATION DOSE REDUCTION: This exam was performed according to the departmental dose-optimization program which includes automated exposure control, adjustment of the mA and/or kV according to patient size and/or use of iterative reconstruction technique. COMPARISON:  05/22/2022 FINDINGS: Alignment: There is minimal anterolisthesis at C3-C4 level which has not changed. This may be residual change from previous ligament injury and facet degeneration. There is mild levoscoliosis at the cervicothoracic junction. Skull base and vertebrae: No recent fracture is seen. Degenerative changes are noted with disc space narrowing, bony spurs and facet hypertrophy at multiple levels. Soft tissues and spinal canal: There is no significant central spinal stenosis. Disc levels: There is encroachment of neural foramina from C2 to C7 levels. Upper chest: Breathing motion limits evaluation. Subtle increased interstitial markings are seen in the anterior right upper lung field and posterior left upper lung field, possibly scarring. Other: Arterial calcifications are seen. IMPRESSION: No recent fracture is seen in cervical spine. Cervical spondylosis with encroachment of neural foramina from C2-C7 levels. Electronically Signed   By: Ernie Avena M.D.   On: 07/14/2022 18:28   CT HEAD WO CONTRAST ( )  Result Date: 07/14/2022 CLINICAL DATA:  Trauma, fall EXAM: CT HEAD WITHOUT CONTRAST TECHNIQUE: Contiguous axial images were obtained from the base of the skull through the vertex without intravenous contrast. RADIATION DOSE REDUCTION: This exam was performed according to the departmental dose-optimization program which includes automated exposure control, adjustment of the mA and/or kV according to patient size and/or use of iterative reconstruction technique. COMPARISON:  05/22/2022 FINDINGS: Brain: No acute intracranial findings are seen. There are no signs of bleeding within the cranium. Cortical sulci are prominent. There is  decreased density in periventricular and subcortical white matter. Vascular: Scattered arterial calcifications are seen. Skull: No acute findings are seen in calvarium. Sinuses/Orbits: There is opacification of right maxillary sinus. There is mucosal thickening in right ethmoid sinus. Other: None. IMPRESSION: No acute intracranial findings are seen in noncontrast CT brain. Atrophy. Small-vessel disease. Chronic sinusitis. Electronically Signed   By: Ernie Avena M.D.   On: 07/14/2022 18:23   DG FEMUR, MIN 2 VIEWS RIGHT  Result Date: 07/14/2022 CLINICAL DATA:  Trauma, fall EXAM: RIGHT FEMUR 2 VIEWS COMPARISON:  05/22/2022 FINDINGS: There is interval internal fixation of intertrochanteric fracture of right femur. No recent fracture or dislocation is seen in right femur. There is a new fracture in the roof of right acetabulum. There is mild buckling of upper margin of right pubic bone. There is a cortical irregularity at the junction of body and right inferior pubic ramus which may have been there in the previous study. IMPRESSION: Interval internal fixation of intertrochanteric fracture of right femur. No new fractures are seen in right femur. There is new fracture in right acetabulum. There is minimal cortical irregularity in the upper margin of right pubic bone suggesting possible recent or old fracture. Fracture at the junction of the right pubic bone and right inferior pubic ramus may be old. Electronically Signed   By: Rhae Hammock  Rathinasamy M.D.   On: 07/14/2022 18:20   DG Chest 1 View  Result Date: 07/14/2022 CLINICAL DATA:  Trauma, fall EXAM: CHEST  1 VIEW COMPARISON:  05/22/2022 FINDINGS: Transverse diameter of heart is increased. There are no signs of alveolar pulmonary edema or focal pulmonary consolidation. There is no pleural effusion or pneumothorax. Deformities are noted in multiple left ribs and possibly right lower ribs with no significant change suggesting old fractures. Fractures are  noted in the lateral aspect of left lower ribs with possible adjacent callus formation. There is previous vertebroplasty in midthoracic spine. There is previous surgical fusion at thoracolumbar junction. IMPRESSION: Cardiomegaly. There are no signs of pulmonary edema or focal pulmonary consolidation. Electronically Signed   By: Ernie Avena M.D.   On: 07/14/2022 18:15   DG Pelvis 1-2 Views  Result Date: 07/14/2022 CLINICAL DATA:  Trauma, fall EXAM: PELVIS - 1-2 VIEW COMPARISON:  05/22/2022 FINDINGS: There is previous left hip arthroplasty. There is previous internal fixation in right femur. There is a break in the cortical margin in the medial wall of right acetabulum. There is possible disruption of upper margin of the right acetabulum. There is minimal cortical irregularity in both pubic bones which may be recent or old. IMPRESSION: There is recent comminuted fracture in medial wall and possibly upper margin of right acetabulum. Electronically Signed   By: Ernie Avena M.D.   On: 07/14/2022 18:13   CT Hip Right Wo Contrast  Result Date: 06/22/2022 CLINICAL DATA:  Hip pain after fall EXAM: CT OF THE RIGHT HIP WITHOUT CONTRAST TECHNIQUE: Multidetector CT imaging of the right hip was performed according to the standard protocol. Multiplanar CT image reconstructions were also generated. RADIATION DOSE REDUCTION: This exam was performed according to the departmental dose-optimization program which includes automated exposure control, adjustment of the mA and/or kV according to patient size and/or use of iterative reconstruction technique. COMPARISON:  Radiographs 05/22/2022 FINDINGS: Exam is mildly compromised by streak artifact from metallic hardware. Bones/Joint/Cartilage Interval placement of an intramedullary rod and screw across a intertrochanteric fracture. The fracture line remains visible. Medial displacement of the lesser trochanter. Remote inferior pubic ramus fracture. No definite new  acute fracture. No dislocation. Ligaments Suboptimally assessed by CT. Muscles and Tendons No definite acute abnormality. Soft tissues Edema within the subcutaneous gluteal fat. IMPRESSION: 1. IM rod and screw across a intertrochanteric fracture. The fracture line remains visible. No definite new acute fracture. Electronically Signed   By: Minerva Fester M.D.   On: 06/22/2022 21:38      Subjective: Seen and examined on the day of discharge.  At baseline.  Stable no distress  Discharge Exam: Vitals:   07/18/22 2347 07/19/22 0733  BP: 127/60 127/67  Pulse: 65 73  Resp: 17 17  Temp: 98 F (36.7 C) 98.7 F (37.1 C)  SpO2: 98% 98%   Vitals:   07/18/22 0815 07/18/22 1637 07/18/22 2347 07/19/22 0733  BP: 134/77 124/66 127/60 127/67  Pulse: 71 74 65 73  Resp: 19 16 17 17   Temp:  99.8 F (37.7 C) 98 F (36.7 C) 98.7 F (37.1 C)  TempSrc:      SpO2: 98% 100% 98% 98%  Weight:      Height:        General: Pt is alert, awake, not in acute distress Cardiovascular: RRR, S1/S2 +, no rubs, no gallops Respiratory: CTA bilaterally, no wheezing, no rhonchi Abdominal: Soft, NT, ND, bowel sounds + Extremities: no edema, no cyanosis  The results of significant diagnostics from this hospitalization (including imaging, microbiology, ancillary and laboratory) are listed below for reference.     Microbiology: Recent Results (from the past 240 hour(s))  Culture, blood (Routine X 2) w Reflex to ID Panel     Status: None   Collection Time: 07/14/22 11:33 PM   Specimen: BLOOD  Result Value Ref Range Status   Specimen Description BLOOD BLOOD RIGHT ARM  Final   Special Requests   Final    BOTTLES DRAWN AEROBIC AND ANAEROBIC Blood Culture results may not be optimal due to an excessive volume of blood received in culture bottles   Culture   Final    NO GROWTH 5 DAYS Performed at [facility], [address] [place], [place] [address]    Report Status 07/19/2022 FINAL  Final   Culture, blood (Routine X 2) w Reflex to ID Panel     Status: None   Collection Time: 07/14/22 11:33 PM   Specimen: BLOOD  Result Value Ref Range Status   Specimen Description BLOOD BLOOD RIGHT ARM  Final   Special Requests   Final    BOTTLES DRAWN AEROBIC AND ANAEROBIC Blood Culture results may not be optimal due to an excessive volume of blood received in culture bottles   Culture   Final    NO GROWTH 5 DAYS Performed at [facility], [address] [place], [place] [address]    Report Status 07/19/2022 FINAL  Final  Urine Culture     Status: Abnormal   Collection Time: 07/15/22  1:42 AM   Specimen: Urine, Random  Result Value Ref Range Status   Specimen Description   Final    URINE, RANDOM Performed at [facility], [address] [place], [place] [address]    Special Requests   Final    NONE Performed at [facility], [address] [place], [place] [address]    Culture >=100,000 COLONIES/mL ESCHERICHIA COLI (A)  Final   Report Status 07/17/2022 FINAL  Final   Organism ID, Bacteria ESCHERICHIA COLI (A)  Final      Susceptibility   Escherichia coli - MIC*    AMPICILLIN 4 SENSITIVE Sensitive     CEFAZOLIN <=4 SENSITIVE Sensitive     CEFEPIME <=0.12 SENSITIVE Sensitive     CEFTRIAXONE <=0.25 SENSITIVE Sensitive     CIPROFLOXACIN <=0.25 SENSITIVE Sensitive     GENTAMICIN <=1 SENSITIVE Sensitive     IMIPENEM <=0.25 SENSITIVE Sensitive     NITROFURANTOIN <=16 SENSITIVE Sensitive     TRIMETH/SULFA <=20 SENSITIVE Sensitive     AMPICILLIN/SULBACTAM <=2 SENSITIVE Sensitive     PIP/TAZO <=4 SENSITIVE Sensitive     * >=100,000 COLONIES/mL ESCHERICHIA COLI  MRSA Next Gen by PCR, Nasal     Status: None   Collection Time: 07/15/22 12:55 PM   Specimen: Nasal Mucosa; Nasal Swab  Result Value Ref Range Status   MRSA by PCR Next Gen NOT DETECTED NOT DETECTED Final    Comment: (NOTE) The GeneXpert MRSA Assay (FDA approved for NASAL specimens  only), is one component of a comprehensive MRSA colonization surveillance program. It is not intended to diagnose MRSA infection nor to guide or monitor treatment for MRSA infections. Test performance is not FDA approved in patients less than 6 years old. Performed at [facility], [address] [place], [place] [address]      Labs: BNP (last 3 results) Recent Labs    05/22/22 1305  BNP 204.5*  Basic Metabolic Panel: Recent Labs  Lab 07/14/22 1706 07/14/22 2333 07/15/22 0434 07/16/22 1003  NA 137  --  136 139  K 4.2  --  4.3 4.1  CL 107  --  110 109  CO2 20*  --  21* 19*  GLUCOSE 156*  --  108* 100*  BUN 23  --  21 23  CREATININE 0.90 0.73 0.73 0.77  CALCIUM 8.6*  --  8.2* 8.2*   Liver Function Tests: Recent Labs  Lab 07/14/22 1706  AST 23  ALT 19  ALKPHOS 120  BILITOT 0.7  PROT 7.2  ALBUMIN 3.2*   No results for input(s): "LIPASE", "AMYLASE" in the last 168 hours. No results for input(s): "AMMONIA" in the last 168 hours. CBC: Recent Labs  Lab 07/14/22 1706 07/14/22 2333 07/15/22 0434 07/16/22 1003  WBC 13.8*  13.7* 17.4* 13.9* 12.9*  NEUTROABS 11.1*  --   --  10.3*  HGB 10.8*  10.8* 10.7* 9.4* 9.6*  HCT 32.3*  32.1* 30.9* 27.6* 27.7*  MCV 86.4  84.9 82.8 84.4 83.4  PLT 472*  460* 397 340 332   Cardiac Enzymes: No results for input(s): "CKTOTAL", "CKMB", "CKMBINDEX", "TROPONINI" in the last 168 hours. BNP: Invalid input(s): "POCBNP" CBG: No results for input(s): "GLUCAP" in the last 168 hours. D-Dimer No results for input(s): "DDIMER" in the last 72 hours. Hgb A1c No results for input(s): "HGBA1C" in the last 72 hours. Lipid Profile No results for input(s): "CHOL", "HDL", "LDLCALC", "TRIG", "CHOLHDL", "LDLDIRECT" in the last 72 hours. Thyroid function studies No results for input(s): "TSH", "T4TOTAL", "T3FREE", "THYROIDAB" in the last 72 hours.  Invalid input(s): "FREET3" Anemia work up No results for input(s):  "VITAMINB12", "FOLATE", "FERRITIN", "TIBC", "IRON", "RETICCTPCT" in the last 72 hours. Urinalysis    Component Value Date/Time   COLORURINE YELLOW (A) 07/15/2022 0142   APPEARANCEUR HAZY (A) 07/15/2022 0142   LABSPEC 1.013 07/15/2022 0142   PHURINE 6.0 07/15/2022 0142   GLUCOSEU NEGATIVE 07/15/2022 0142   HGBUR NEGATIVE 07/15/2022 0142   BILIRUBINUR NEGATIVE 07/15/2022 0142   KETONESUR NEGATIVE 07/15/2022 0142   PROTEINUR NEGATIVE 07/15/2022 0142   NITRITE POSITIVE (A) 07/15/2022 0142   LEUKOCYTESUR LARGE (A) 07/15/2022 0142   Sepsis Labs Recent Labs  Lab 07/14/22 1706 07/14/22 2333 07/15/22 0434 07/16/22 1003  WBC 13.8*  13.7* 17.4* 13.9* 12.9*   Microbiology Recent Results (from the past 240 hour(s))  Culture, blood (Routine X 2) w Reflex to ID Panel     Status: None   Collection Time: 07/14/22 11:33 PM   Specimen: BLOOD  Result Value Ref Range Status   Specimen Description BLOOD BLOOD RIGHT ARM  Final   Special Requests   Final    BOTTLES DRAWN AEROBIC AND ANAEROBIC Blood Culture results may not be optimal due to an excessive volume of blood received in culture bottles   Culture   Final    NO GROWTH 5 DAYS Performed at [facility], [address] [place], [place] [address]    Report Status 07/19/2022 FINAL  Final  Culture, blood (Routine X 2) w Reflex to ID Panel     Status: None   Collection Time: 07/14/22 11:33 PM   Specimen: BLOOD  Result Value Ref Range Status   Specimen Description BLOOD BLOOD RIGHT ARM  Final   Special Requests   Final    BOTTLES DRAWN AEROBIC AND ANAEROBIC Blood Culture results may not be optimal due to an excessive volume of blood received in  culture bottles   Culture   Final    NO GROWTH 5 DAYS Performed at [facility], [address] [place], [place] [address]    Report Status 07/19/2022 FINAL  Final  Urine Culture     Status: Abnormal   Collection Time: 07/15/22  1:42 AM   Specimen: Urine, Random  Result  Value Ref Range Status   Specimen Description   Final    URINE, RANDOM Performed at [facility], [address] [place], [place] [address]    Special Requests   Final    NONE Performed at [facility], [address] [place], [place] [address]    Culture >=100,000 COLONIES/mL ESCHERICHIA COLI (A)  Final   Report Status 07/17/2022 FINAL  Final   Organism ID, Bacteria ESCHERICHIA COLI (A)  Final      Susceptibility   Escherichia coli - MIC*    AMPICILLIN 4 SENSITIVE Sensitive     CEFAZOLIN <=4 SENSITIVE Sensitive     CEFEPIME <=0.12 SENSITIVE Sensitive     CEFTRIAXONE <=0.25 SENSITIVE Sensitive     CIPROFLOXACIN <=0.25 SENSITIVE Sensitive     GENTAMICIN <=1 SENSITIVE Sensitive     IMIPENEM <=0.25 SENSITIVE Sensitive     NITROFURANTOIN <=16 SENSITIVE Sensitive     TRIMETH/SULFA <=20 SENSITIVE Sensitive     AMPICILLIN/SULBACTAM <=2 SENSITIVE Sensitive     PIP/TAZO <=4 SENSITIVE Sensitive     * >=100,000 COLONIES/mL ESCHERICHIA COLI  MRSA Next Gen by PCR, Nasal     Status: None   Collection Time: 07/15/22 12:55 PM   Specimen: Nasal Mucosa; Nasal Swab  Result Value Ref Range Status   MRSA by PCR Next Gen NOT DETECTED NOT DETECTED Final    Comment: (NOTE) The GeneXpert MRSA Assay (FDA approved for NASAL specimens only), is one component of a comprehensive MRSA colonization surveillance program. It is not intended to diagnose MRSA infection nor to guide or monitor treatment for MRSA infections. Test performance is not FDA approved in patients less than 69 years old. Performed at [facility], [address] [place], [place] [address]      Time coordinating discharge: Over 30 minutes  SIGNED:   Tresa Moore, MD  [facility] 07/19/2022, 12:06 PM Pager   If 7PM-7AM, please contact night-coverage

## 2022-07-19 NOTE — Plan of Care (Signed)
Patient discharged per MD orders at this time.All dc instructions, education and medications reviewed with the patient.Pt expressed understanding and will comply with dc instructions.follow up appointments was also communicated to the Pt.no verbal c/o or any ssx of distress.Pt was discharged to the [facility] rehabilitation center.report was called to staff nurse Victorino Dike before transport.Pt was transported by 2 [facility] personnel on a stretcher.

## 2022-07-19 NOTE — Plan of Care (Signed)

## 2022-07-19 NOTE — TOC Progression Note (Signed)
Transition of Care [facility]) - Progression Note    Patient Details  Name: Lisa Martin MRN: [id number] Date of Birth: [birth year]  Transition of Care [facility]) CM/SW Contact  Marlowe Sax, RN Phone Number: 07/19/2022, 9:22 AM  Clinical Narrative:    [facility]A called and notified me of the denial, Dr Logan Bores feels she is at her baseline and with her dementia will not be able to participate in therapy effectively   I called [name] the patient's daughter and let Her know and she stated that she agreed, She will return to the memory care unit I explained that she will return to [facility] today and EMS was apprvoed by ins.        Expected Discharge Plan and Services                                               Social Determinants of Health (SDOH) Interventions SDOH Screenings   Food Insecurity: No Food Insecurity (07/15/2022)  Housing: Low Risk  (07/15/2022)  Transportation Needs: No Transportation Needs (07/15/2022)  Utilities: Not At Risk (07/15/2022)  Tobacco Use: Medium Risk (07/14/2022)    Readmission Risk Interventions     No data to display

## 2022-07-19 NOTE — Care Management Important Message (Signed)
Important Message  Patient Details  Name: Lisa Martin MRN: [id number] Date of Birth: [birth year]   Medicare Important Message Given:  Yes  I reviewed the form with the patient and obtained her signature. I will forward to Health Information Management to scan into her medical record.  I wished her a speedy recovery and thanked her for her time.   Olegario Messier A [name] 07/19/2022, 1:54 PM

## 2022-07-19 NOTE — TOC Progression Note (Signed)
Transition of Care [facility]) - Progression Note    Patient Details  Name: Lisa Martin MRN: [id number] Date of Birth: 07-26-1931  Transition of Care [facility]) CM/SW Contact  Marlowe Sax, RN Phone Number: 07/19/2022, 12:24 PM  Clinical Narrative:    Called EMS to arrange transport 3rd on list        Expected Discharge Plan and Services         Expected Discharge Date: 07/19/22                                     Social Determinants of Health (SDOH) Interventions SDOH Screenings   Food Insecurity: No Food Insecurity (07/15/2022)  Housing: Low Risk  (07/15/2022)  Transportation Needs: No Transportation Needs (07/15/2022)  Utilities: Not At Risk (07/15/2022)  Tobacco Use: Medium Risk (07/14/2022)    Readmission Risk Interventions     No data to display

## 2022-07-21 DIAGNOSIS — I10 Essential (primary) hypertension: Secondary | ICD-10-CM | POA: Diagnosis not present

## 2022-07-22 DIAGNOSIS — K5900 Constipation, unspecified: Secondary | ICD-10-CM | POA: Diagnosis not present

## 2022-07-22 DIAGNOSIS — S32471D Displaced fracture of medial wall of right acetabulum, subsequent encounter for fracture with routine healing: Secondary | ICD-10-CM | POA: Diagnosis not present

## 2022-07-22 DIAGNOSIS — F419 Anxiety disorder, unspecified: Secondary | ICD-10-CM | POA: Diagnosis not present

## 2022-07-22 DIAGNOSIS — E039 Hypothyroidism, unspecified: Secondary | ICD-10-CM | POA: Diagnosis not present

## 2022-07-22 DIAGNOSIS — N390 Urinary tract infection, site not specified: Secondary | ICD-10-CM | POA: Diagnosis not present

## 2022-07-22 DIAGNOSIS — G309 Alzheimer's disease, unspecified: Secondary | ICD-10-CM | POA: Diagnosis not present

## 2022-07-22 DIAGNOSIS — E43 Unspecified severe protein-calorie malnutrition: Secondary | ICD-10-CM | POA: Diagnosis not present

## 2022-07-22 DIAGNOSIS — F03A18 Unspecified dementia, mild, with other behavioral disturbance: Secondary | ICD-10-CM | POA: Diagnosis not present

## 2022-07-22 DIAGNOSIS — I110 Hypertensive heart disease with heart failure: Secondary | ICD-10-CM | POA: Diagnosis not present

## 2022-07-22 DIAGNOSIS — F32A Depression, unspecified: Secondary | ICD-10-CM | POA: Diagnosis not present

## 2022-07-25 DIAGNOSIS — E039 Hypothyroidism, unspecified: Secondary | ICD-10-CM | POA: Diagnosis not present

## 2022-07-25 DIAGNOSIS — S32471D Displaced fracture of medial wall of right acetabulum, subsequent encounter for fracture with routine healing: Secondary | ICD-10-CM | POA: Diagnosis not present

## 2022-07-25 DIAGNOSIS — G309 Alzheimer's disease, unspecified: Secondary | ICD-10-CM | POA: Diagnosis not present

## 2022-07-25 DIAGNOSIS — K5900 Constipation, unspecified: Secondary | ICD-10-CM | POA: Diagnosis not present

## 2022-07-25 DIAGNOSIS — F419 Anxiety disorder, unspecified: Secondary | ICD-10-CM | POA: Diagnosis not present

## 2022-07-25 DIAGNOSIS — E43 Unspecified severe protein-calorie malnutrition: Secondary | ICD-10-CM | POA: Diagnosis not present

## 2022-07-25 DIAGNOSIS — I110 Hypertensive heart disease with heart failure: Secondary | ICD-10-CM | POA: Diagnosis not present

## 2022-07-25 DIAGNOSIS — F03A18 Unspecified dementia, mild, with other behavioral disturbance: Secondary | ICD-10-CM | POA: Diagnosis not present

## 2022-07-26 DIAGNOSIS — I10 Essential (primary) hypertension: Secondary | ICD-10-CM | POA: Diagnosis not present

## 2022-07-26 DIAGNOSIS — F32A Depression, unspecified: Secondary | ICD-10-CM | POA: Diagnosis not present

## 2022-07-26 DIAGNOSIS — E039 Hypothyroidism, unspecified: Secondary | ICD-10-CM | POA: Diagnosis not present

## 2022-07-26 DIAGNOSIS — S32471D Displaced fracture of medial wall of right acetabulum, subsequent encounter for fracture with routine healing: Secondary | ICD-10-CM | POA: Diagnosis not present

## 2022-07-26 DIAGNOSIS — K5900 Constipation, unspecified: Secondary | ICD-10-CM | POA: Diagnosis not present

## 2022-07-26 DIAGNOSIS — N6320 Unspecified lump in the left breast, unspecified quadrant: Secondary | ICD-10-CM | POA: Diagnosis not present

## 2022-07-26 DIAGNOSIS — N390 Urinary tract infection, site not specified: Secondary | ICD-10-CM | POA: Diagnosis not present

## 2022-07-26 DIAGNOSIS — I110 Hypertensive heart disease with heart failure: Secondary | ICD-10-CM | POA: Diagnosis not present

## 2022-07-26 DIAGNOSIS — F419 Anxiety disorder, unspecified: Secondary | ICD-10-CM | POA: Diagnosis not present

## 2022-07-26 DIAGNOSIS — F03A18 Unspecified dementia, mild, with other behavioral disturbance: Secondary | ICD-10-CM | POA: Diagnosis not present

## 2022-07-27 DIAGNOSIS — N6320 Unspecified lump in the left breast, unspecified quadrant: Secondary | ICD-10-CM | POA: Diagnosis not present

## 2022-07-27 DIAGNOSIS — S32471D Displaced fracture of medial wall of right acetabulum, subsequent encounter for fracture with routine healing: Secondary | ICD-10-CM | POA: Diagnosis not present

## 2022-07-27 DIAGNOSIS — E43 Unspecified severe protein-calorie malnutrition: Secondary | ICD-10-CM | POA: Diagnosis not present

## 2022-07-27 DIAGNOSIS — F02B3 Dementia in other diseases classified elsewhere, moderate, with mood disturbance: Secondary | ICD-10-CM | POA: Diagnosis not present

## 2022-07-27 DIAGNOSIS — F331 Major depressive disorder, recurrent, moderate: Secondary | ICD-10-CM | POA: Diagnosis not present

## 2022-07-27 DIAGNOSIS — G309 Alzheimer's disease, unspecified: Secondary | ICD-10-CM | POA: Diagnosis not present

## 2022-07-27 DIAGNOSIS — Z79899 Other long term (current) drug therapy: Secondary | ICD-10-CM | POA: Diagnosis not present

## 2022-07-27 DIAGNOSIS — F03A18 Unspecified dementia, mild, with other behavioral disturbance: Secondary | ICD-10-CM | POA: Diagnosis not present

## 2022-07-27 DIAGNOSIS — I110 Hypertensive heart disease with heart failure: Secondary | ICD-10-CM | POA: Diagnosis not present

## 2022-07-27 DIAGNOSIS — G301 Alzheimer's disease with late onset: Secondary | ICD-10-CM | POA: Diagnosis not present

## 2022-07-27 DIAGNOSIS — K5900 Constipation, unspecified: Secondary | ICD-10-CM | POA: Diagnosis not present

## 2022-07-27 DIAGNOSIS — F32A Depression, unspecified: Secondary | ICD-10-CM | POA: Diagnosis not present

## 2022-08-03 DIAGNOSIS — S32471D Displaced fracture of medial wall of right acetabulum, subsequent encounter for fracture with routine healing: Secondary | ICD-10-CM | POA: Diagnosis not present

## 2022-08-03 DIAGNOSIS — I10 Essential (primary) hypertension: Secondary | ICD-10-CM | POA: Diagnosis not present

## 2022-08-03 DIAGNOSIS — E039 Hypothyroidism, unspecified: Secondary | ICD-10-CM | POA: Diagnosis not present

## 2022-08-03 DIAGNOSIS — E43 Unspecified severe protein-calorie malnutrition: Secondary | ICD-10-CM | POA: Diagnosis not present

## 2022-08-05 DIAGNOSIS — S32461A Displaced associated transverse-posterior fracture of right acetabulum, initial encounter for closed fracture: Secondary | ICD-10-CM | POA: Diagnosis not present

## 2022-08-08 DIAGNOSIS — S32471D Displaced fracture of medial wall of right acetabulum, subsequent encounter for fracture with routine healing: Secondary | ICD-10-CM | POA: Diagnosis not present

## 2022-08-08 DIAGNOSIS — W19XXXA Unspecified fall, initial encounter: Secondary | ICD-10-CM | POA: Diagnosis not present

## 2022-08-08 DIAGNOSIS — G309 Alzheimer's disease, unspecified: Secondary | ICD-10-CM | POA: Diagnosis not present

## 2022-08-08 DIAGNOSIS — K5900 Constipation, unspecified: Secondary | ICD-10-CM | POA: Diagnosis not present

## 2022-08-08 DIAGNOSIS — F03A18 Unspecified dementia, mild, with other behavioral disturbance: Secondary | ICD-10-CM | POA: Diagnosis not present

## 2022-08-08 DIAGNOSIS — K219 Gastro-esophageal reflux disease without esophagitis: Secondary | ICD-10-CM | POA: Diagnosis not present

## 2022-08-10 DIAGNOSIS — R600 Localized edema: Secondary | ICD-10-CM | POA: Diagnosis not present

## 2022-08-10 DIAGNOSIS — S32471D Displaced fracture of medial wall of right acetabulum, subsequent encounter for fracture with routine healing: Secondary | ICD-10-CM | POA: Diagnosis not present

## 2022-08-10 DIAGNOSIS — F03A18 Unspecified dementia, mild, with other behavioral disturbance: Secondary | ICD-10-CM | POA: Diagnosis not present

## 2022-08-10 DIAGNOSIS — K5900 Constipation, unspecified: Secondary | ICD-10-CM | POA: Diagnosis not present

## 2022-08-10 DIAGNOSIS — I5030 Unspecified diastolic (congestive) heart failure: Secondary | ICD-10-CM | POA: Diagnosis not present

## 2022-08-10 DIAGNOSIS — G309 Alzheimer's disease, unspecified: Secondary | ICD-10-CM | POA: Diagnosis not present

## 2022-08-17 DIAGNOSIS — E559 Vitamin D deficiency, unspecified: Secondary | ICD-10-CM | POA: Diagnosis not present

## 2022-08-17 DIAGNOSIS — E119 Type 2 diabetes mellitus without complications: Secondary | ICD-10-CM | POA: Diagnosis not present

## 2022-08-17 DIAGNOSIS — I10 Essential (primary) hypertension: Secondary | ICD-10-CM | POA: Diagnosis not present

## 2022-08-19 DIAGNOSIS — L603 Nail dystrophy: Secondary | ICD-10-CM | POA: Diagnosis not present

## 2022-08-19 DIAGNOSIS — S32471D Displaced fracture of medial wall of right acetabulum, subsequent encounter for fracture with routine healing: Secondary | ICD-10-CM | POA: Diagnosis not present

## 2022-08-19 DIAGNOSIS — E43 Unspecified severe protein-calorie malnutrition: Secondary | ICD-10-CM | POA: Diagnosis not present

## 2022-08-19 DIAGNOSIS — I739 Peripheral vascular disease, unspecified: Secondary | ICD-10-CM | POA: Diagnosis not present

## 2022-08-19 DIAGNOSIS — I110 Hypertensive heart disease with heart failure: Secondary | ICD-10-CM | POA: Diagnosis not present

## 2022-08-19 DIAGNOSIS — K219 Gastro-esophageal reflux disease without esophagitis: Secondary | ICD-10-CM | POA: Diagnosis not present

## 2022-08-19 DIAGNOSIS — E039 Hypothyroidism, unspecified: Secondary | ICD-10-CM | POA: Diagnosis not present

## 2022-08-26 DIAGNOSIS — G309 Alzheimer's disease, unspecified: Secondary | ICD-10-CM | POA: Diagnosis not present

## 2022-08-26 DIAGNOSIS — I10 Essential (primary) hypertension: Secondary | ICD-10-CM | POA: Diagnosis not present

## 2022-08-26 DIAGNOSIS — S32471D Displaced fracture of medial wall of right acetabulum, subsequent encounter for fracture with routine healing: Secondary | ICD-10-CM | POA: Diagnosis not present

## 2022-08-26 DIAGNOSIS — E039 Hypothyroidism, unspecified: Secondary | ICD-10-CM | POA: Diagnosis not present

## 2022-08-26 DIAGNOSIS — S32461A Displaced associated transverse-posterior fracture of right acetabulum, initial encounter for closed fracture: Secondary | ICD-10-CM | POA: Diagnosis not present

## 2022-08-26 DIAGNOSIS — K5900 Constipation, unspecified: Secondary | ICD-10-CM | POA: Diagnosis not present

## 2022-08-29 DIAGNOSIS — S32471D Displaced fracture of medial wall of right acetabulum, subsequent encounter for fracture with routine healing: Secondary | ICD-10-CM | POA: Diagnosis not present

## 2022-08-29 DIAGNOSIS — F419 Anxiety disorder, unspecified: Secondary | ICD-10-CM | POA: Diagnosis not present

## 2022-08-29 DIAGNOSIS — D649 Anemia, unspecified: Secondary | ICD-10-CM | POA: Diagnosis not present

## 2022-08-29 DIAGNOSIS — I5032 Chronic diastolic (congestive) heart failure: Secondary | ICD-10-CM | POA: Diagnosis not present

## 2022-08-31 DIAGNOSIS — G301 Alzheimer's disease with late onset: Secondary | ICD-10-CM | POA: Diagnosis not present

## 2022-08-31 DIAGNOSIS — F331 Major depressive disorder, recurrent, moderate: Secondary | ICD-10-CM | POA: Diagnosis not present

## 2022-08-31 DIAGNOSIS — F02B3 Dementia in other diseases classified elsewhere, moderate, with mood disturbance: Secondary | ICD-10-CM | POA: Diagnosis not present

## 2022-09-19 DIAGNOSIS — H2522 Age-related cataract, morgagnian type, left eye: Secondary | ICD-10-CM | POA: Diagnosis not present

## 2022-09-19 DIAGNOSIS — H50112 Monocular exotropia, left eye: Secondary | ICD-10-CM | POA: Diagnosis not present

## 2022-09-19 DIAGNOSIS — Z961 Presence of intraocular lens: Secondary | ICD-10-CM | POA: Diagnosis not present

## 2022-09-19 DIAGNOSIS — H524 Presbyopia: Secondary | ICD-10-CM | POA: Diagnosis not present

## 2022-09-20 DIAGNOSIS — Z79899 Other long term (current) drug therapy: Secondary | ICD-10-CM | POA: Diagnosis not present

## 2022-09-26 DIAGNOSIS — I10 Essential (primary) hypertension: Secondary | ICD-10-CM | POA: Diagnosis not present

## 2022-09-26 DIAGNOSIS — G309 Alzheimer's disease, unspecified: Secondary | ICD-10-CM | POA: Diagnosis not present

## 2022-09-27 DIAGNOSIS — K5900 Constipation, unspecified: Secondary | ICD-10-CM | POA: Diagnosis not present

## 2022-09-27 DIAGNOSIS — S32471D Displaced fracture of medial wall of right acetabulum, subsequent encounter for fracture with routine healing: Secondary | ICD-10-CM | POA: Diagnosis not present

## 2022-09-27 DIAGNOSIS — I10 Essential (primary) hypertension: Secondary | ICD-10-CM | POA: Diagnosis not present

## 2022-09-27 DIAGNOSIS — E039 Hypothyroidism, unspecified: Secondary | ICD-10-CM | POA: Diagnosis not present

## 2022-09-28 DIAGNOSIS — E039 Hypothyroidism, unspecified: Secondary | ICD-10-CM | POA: Diagnosis not present

## 2022-09-28 DIAGNOSIS — G309 Alzheimer's disease, unspecified: Secondary | ICD-10-CM | POA: Diagnosis not present

## 2022-09-28 DIAGNOSIS — E559 Vitamin D deficiency, unspecified: Secondary | ICD-10-CM | POA: Diagnosis not present

## 2022-09-28 DIAGNOSIS — F02B3 Dementia in other diseases classified elsewhere, moderate, with mood disturbance: Secondary | ICD-10-CM | POA: Diagnosis not present

## 2022-09-28 DIAGNOSIS — F03A18 Unspecified dementia, mild, with other behavioral disturbance: Secondary | ICD-10-CM | POA: Diagnosis not present

## 2022-09-28 DIAGNOSIS — G301 Alzheimer's disease with late onset: Secondary | ICD-10-CM | POA: Diagnosis not present

## 2022-09-28 DIAGNOSIS — K219 Gastro-esophageal reflux disease without esophagitis: Secondary | ICD-10-CM | POA: Diagnosis not present

## 2022-09-28 DIAGNOSIS — F331 Major depressive disorder, recurrent, moderate: Secondary | ICD-10-CM | POA: Diagnosis not present

## 2022-10-07 DIAGNOSIS — S32461A Displaced associated transverse-posterior fracture of right acetabulum, initial encounter for closed fracture: Secondary | ICD-10-CM | POA: Diagnosis not present

## 2022-10-12 DIAGNOSIS — G301 Alzheimer's disease with late onset: Secondary | ICD-10-CM | POA: Diagnosis not present

## 2022-10-12 DIAGNOSIS — F02B3 Dementia in other diseases classified elsewhere, moderate, with mood disturbance: Secondary | ICD-10-CM | POA: Diagnosis not present

## 2022-10-12 DIAGNOSIS — F331 Major depressive disorder, recurrent, moderate: Secondary | ICD-10-CM | POA: Diagnosis not present

## 2022-10-20 DIAGNOSIS — I110 Hypertensive heart disease with heart failure: Secondary | ICD-10-CM | POA: Diagnosis not present

## 2022-10-20 DIAGNOSIS — I10 Essential (primary) hypertension: Secondary | ICD-10-CM | POA: Diagnosis not present

## 2022-10-20 DIAGNOSIS — K219 Gastro-esophageal reflux disease without esophagitis: Secondary | ICD-10-CM | POA: Diagnosis not present

## 2022-10-20 DIAGNOSIS — I5032 Chronic diastolic (congestive) heart failure: Secondary | ICD-10-CM | POA: Diagnosis not present

## 2022-10-20 DIAGNOSIS — G309 Alzheimer's disease, unspecified: Secondary | ICD-10-CM | POA: Diagnosis not present

## 2022-10-24 DIAGNOSIS — E039 Hypothyroidism, unspecified: Secondary | ICD-10-CM | POA: Diagnosis not present

## 2022-10-24 DIAGNOSIS — E538 Deficiency of other specified B group vitamins: Secondary | ICD-10-CM | POA: Diagnosis not present

## 2022-10-24 DIAGNOSIS — K5909 Other constipation: Secondary | ICD-10-CM | POA: Diagnosis not present

## 2022-11-09 DIAGNOSIS — G301 Alzheimer's disease with late onset: Secondary | ICD-10-CM | POA: Diagnosis not present

## 2022-11-09 DIAGNOSIS — F331 Major depressive disorder, recurrent, moderate: Secondary | ICD-10-CM | POA: Diagnosis not present

## 2022-11-09 DIAGNOSIS — F02B3 Dementia in other diseases classified elsewhere, moderate, with mood disturbance: Secondary | ICD-10-CM | POA: Diagnosis not present

## 2022-11-25 DIAGNOSIS — I10 Essential (primary) hypertension: Secondary | ICD-10-CM | POA: Diagnosis not present

## 2022-11-25 DIAGNOSIS — E559 Vitamin D deficiency, unspecified: Secondary | ICD-10-CM | POA: Diagnosis not present

## 2022-11-25 DIAGNOSIS — K219 Gastro-esophageal reflux disease without esophagitis: Secondary | ICD-10-CM | POA: Diagnosis not present

## 2022-11-26 DIAGNOSIS — E559 Vitamin D deficiency, unspecified: Secondary | ICD-10-CM | POA: Diagnosis not present

## 2022-11-26 DIAGNOSIS — I4891 Unspecified atrial fibrillation: Secondary | ICD-10-CM | POA: Diagnosis not present

## 2022-11-28 DIAGNOSIS — L602 Onychogryphosis: Secondary | ICD-10-CM | POA: Diagnosis not present

## 2022-11-28 DIAGNOSIS — L603 Nail dystrophy: Secondary | ICD-10-CM | POA: Diagnosis not present

## 2022-11-28 DIAGNOSIS — I739 Peripheral vascular disease, unspecified: Secondary | ICD-10-CM | POA: Diagnosis not present

## 2022-11-29 DIAGNOSIS — G301 Alzheimer's disease with late onset: Secondary | ICD-10-CM | POA: Diagnosis not present

## 2022-11-29 DIAGNOSIS — F331 Major depressive disorder, recurrent, moderate: Secondary | ICD-10-CM | POA: Diagnosis not present

## 2022-11-29 DIAGNOSIS — F02B3 Dementia in other diseases classified elsewhere, moderate, with mood disturbance: Secondary | ICD-10-CM | POA: Diagnosis not present

## 2022-12-02 DIAGNOSIS — R600 Localized edema: Secondary | ICD-10-CM | POA: Diagnosis not present

## 2022-12-02 DIAGNOSIS — S81811A Laceration without foreign body, right lower leg, initial encounter: Secondary | ICD-10-CM | POA: Diagnosis not present

## 2022-12-20 DIAGNOSIS — G309 Alzheimer's disease, unspecified: Secondary | ICD-10-CM | POA: Diagnosis not present

## 2022-12-20 DIAGNOSIS — K5900 Constipation, unspecified: Secondary | ICD-10-CM | POA: Diagnosis not present

## 2022-12-20 DIAGNOSIS — I5032 Chronic diastolic (congestive) heart failure: Secondary | ICD-10-CM | POA: Diagnosis not present

## 2022-12-20 DIAGNOSIS — S32471D Displaced fracture of medial wall of right acetabulum, subsequent encounter for fracture with routine healing: Secondary | ICD-10-CM | POA: Diagnosis not present

## 2022-12-20 DIAGNOSIS — I110 Hypertensive heart disease with heart failure: Secondary | ICD-10-CM | POA: Diagnosis not present

## 2022-12-20 DIAGNOSIS — I10 Essential (primary) hypertension: Secondary | ICD-10-CM | POA: Diagnosis not present

## 2022-12-23 DIAGNOSIS — Z79899 Other long term (current) drug therapy: Secondary | ICD-10-CM | POA: Diagnosis not present

## 2022-12-27 DIAGNOSIS — I5030 Unspecified diastolic (congestive) heart failure: Secondary | ICD-10-CM | POA: Diagnosis not present

## 2022-12-27 DIAGNOSIS — I10 Essential (primary) hypertension: Secondary | ICD-10-CM | POA: Diagnosis not present

## 2022-12-28 DIAGNOSIS — G301 Alzheimer's disease with late onset: Secondary | ICD-10-CM | POA: Diagnosis not present

## 2022-12-28 DIAGNOSIS — F02B3 Dementia in other diseases classified elsewhere, moderate, with mood disturbance: Secondary | ICD-10-CM | POA: Diagnosis not present

## 2022-12-28 DIAGNOSIS — R059 Cough, unspecified: Secondary | ICD-10-CM | POA: Diagnosis not present

## 2022-12-28 DIAGNOSIS — R062 Wheezing: Secondary | ICD-10-CM | POA: Diagnosis not present

## 2022-12-28 DIAGNOSIS — F331 Major depressive disorder, recurrent, moderate: Secondary | ICD-10-CM | POA: Diagnosis not present

## 2022-12-28 DIAGNOSIS — E039 Hypothyroidism, unspecified: Secondary | ICD-10-CM | POA: Diagnosis not present

## 2022-12-28 DIAGNOSIS — R7309 Other abnormal glucose: Secondary | ICD-10-CM | POA: Diagnosis not present

## 2022-12-28 DIAGNOSIS — I5032 Chronic diastolic (congestive) heart failure: Secondary | ICD-10-CM | POA: Diagnosis not present

## 2023-01-16 DIAGNOSIS — E039 Hypothyroidism, unspecified: Secondary | ICD-10-CM | POA: Diagnosis not present

## 2023-01-18 DIAGNOSIS — I5030 Unspecified diastolic (congestive) heart failure: Secondary | ICD-10-CM | POA: Diagnosis not present

## 2023-01-18 DIAGNOSIS — I10 Essential (primary) hypertension: Secondary | ICD-10-CM | POA: Diagnosis not present

## 2023-01-19 DIAGNOSIS — E039 Hypothyroidism, unspecified: Secondary | ICD-10-CM | POA: Diagnosis not present

## 2023-01-19 DIAGNOSIS — I10 Essential (primary) hypertension: Secondary | ICD-10-CM | POA: Diagnosis not present

## 2023-01-19 DIAGNOSIS — G309 Alzheimer's disease, unspecified: Secondary | ICD-10-CM | POA: Diagnosis not present

## 2023-01-27 DIAGNOSIS — F02B3 Dementia in other diseases classified elsewhere, moderate, with mood disturbance: Secondary | ICD-10-CM | POA: Diagnosis not present

## 2023-01-27 DIAGNOSIS — F331 Major depressive disorder, recurrent, moderate: Secondary | ICD-10-CM | POA: Diagnosis not present

## 2023-01-27 DIAGNOSIS — G301 Alzheimer's disease with late onset: Secondary | ICD-10-CM | POA: Diagnosis not present

## 2023-02-06 ENCOUNTER — Other Ambulatory Visit: Payer: Self-pay

## 2023-02-06 ENCOUNTER — Inpatient Hospital Stay
Admission: EM | Admit: 2023-02-06 | Discharge: 2023-03-04 | DRG: 871 | Disposition: E | Payer: PPO | Source: Skilled Nursing Facility | Attending: Osteopathic Medicine | Admitting: Osteopathic Medicine

## 2023-02-06 ENCOUNTER — Emergency Department: Payer: PPO

## 2023-02-06 ENCOUNTER — Encounter: Payer: Self-pay | Admitting: Emergency Medicine

## 2023-02-06 DIAGNOSIS — I4891 Unspecified atrial fibrillation: Secondary | ICD-10-CM | POA: Insufficient documentation

## 2023-02-06 DIAGNOSIS — A419 Sepsis, unspecified organism: Secondary | ICD-10-CM | POA: Diagnosis present

## 2023-02-06 DIAGNOSIS — E039 Hypothyroidism, unspecified: Secondary | ICD-10-CM | POA: Diagnosis not present

## 2023-02-06 DIAGNOSIS — Z515 Encounter for palliative care: Secondary | ICD-10-CM

## 2023-02-06 DIAGNOSIS — I959 Hypotension, unspecified: Secondary | ICD-10-CM | POA: Diagnosis not present

## 2023-02-06 DIAGNOSIS — R627 Adult failure to thrive: Secondary | ICD-10-CM | POA: Diagnosis present

## 2023-02-06 DIAGNOSIS — J189 Pneumonia, unspecified organism: Secondary | ICD-10-CM | POA: Diagnosis not present

## 2023-02-06 DIAGNOSIS — J9601 Acute respiratory failure with hypoxia: Secondary | ICD-10-CM | POA: Diagnosis not present

## 2023-02-06 DIAGNOSIS — Z66 Do not resuscitate: Secondary | ICD-10-CM | POA: Diagnosis present

## 2023-02-06 DIAGNOSIS — F02811 Dementia in other diseases classified elsewhere, unspecified severity, with agitation: Secondary | ICD-10-CM | POA: Diagnosis present

## 2023-02-06 DIAGNOSIS — T461X5A Adverse effect of calcium-channel blockers, initial encounter: Secondary | ICD-10-CM | POA: Diagnosis not present

## 2023-02-06 DIAGNOSIS — R509 Fever, unspecified: Secondary | ICD-10-CM | POA: Diagnosis not present

## 2023-02-06 DIAGNOSIS — F0390 Unspecified dementia without behavioral disturbance: Secondary | ICD-10-CM | POA: Diagnosis present

## 2023-02-06 DIAGNOSIS — E119 Type 2 diabetes mellitus without complications: Secondary | ICD-10-CM | POA: Diagnosis present

## 2023-02-06 DIAGNOSIS — I700 Atherosclerosis of aorta: Secondary | ICD-10-CM | POA: Diagnosis not present

## 2023-02-06 DIAGNOSIS — E46 Unspecified protein-calorie malnutrition: Secondary | ICD-10-CM | POA: Diagnosis present

## 2023-02-06 DIAGNOSIS — K5730 Diverticulosis of large intestine without perforation or abscess without bleeding: Secondary | ICD-10-CM | POA: Diagnosis present

## 2023-02-06 DIAGNOSIS — I952 Hypotension due to drugs: Secondary | ICD-10-CM | POA: Diagnosis not present

## 2023-02-06 DIAGNOSIS — R0902 Hypoxemia: Secondary | ICD-10-CM | POA: Diagnosis not present

## 2023-02-06 DIAGNOSIS — K219 Gastro-esophageal reflux disease without esophagitis: Secondary | ICD-10-CM | POA: Diagnosis present

## 2023-02-06 DIAGNOSIS — N179 Acute kidney failure, unspecified: Secondary | ICD-10-CM

## 2023-02-06 DIAGNOSIS — A4189 Other specified sepsis: Secondary | ICD-10-CM | POA: Diagnosis not present

## 2023-02-06 DIAGNOSIS — R069 Unspecified abnormalities of breathing: Secondary | ICD-10-CM | POA: Diagnosis not present

## 2023-02-06 DIAGNOSIS — G309 Alzheimer's disease, unspecified: Secondary | ICD-10-CM | POA: Diagnosis present

## 2023-02-06 DIAGNOSIS — R1312 Dysphagia, oropharyngeal phase: Secondary | ICD-10-CM | POA: Diagnosis present

## 2023-02-06 DIAGNOSIS — R0602 Shortness of breath: Secondary | ICD-10-CM | POA: Diagnosis not present

## 2023-02-06 DIAGNOSIS — I5032 Chronic diastolic (congestive) heart failure: Secondary | ICD-10-CM | POA: Diagnosis present

## 2023-02-06 DIAGNOSIS — R739 Hyperglycemia, unspecified: Secondary | ICD-10-CM | POA: Diagnosis not present

## 2023-02-06 DIAGNOSIS — R0682 Tachypnea, not elsewhere classified: Secondary | ICD-10-CM

## 2023-02-06 DIAGNOSIS — Z79899 Other long term (current) drug therapy: Secondary | ICD-10-CM

## 2023-02-06 DIAGNOSIS — R6520 Severe sepsis without septic shock: Secondary | ICD-10-CM | POA: Diagnosis not present

## 2023-02-06 DIAGNOSIS — M25561 Pain in right knee: Secondary | ICD-10-CM | POA: Diagnosis present

## 2023-02-06 DIAGNOSIS — R918 Other nonspecific abnormal finding of lung field: Secondary | ICD-10-CM | POA: Diagnosis not present

## 2023-02-06 DIAGNOSIS — E860 Dehydration: Secondary | ICD-10-CM | POA: Diagnosis present

## 2023-02-06 DIAGNOSIS — Z923 Personal history of irradiation: Secondary | ICD-10-CM

## 2023-02-06 DIAGNOSIS — Z9181 History of falling: Secondary | ICD-10-CM

## 2023-02-06 DIAGNOSIS — Z87891 Personal history of nicotine dependence: Secondary | ICD-10-CM

## 2023-02-06 DIAGNOSIS — I10 Essential (primary) hypertension: Secondary | ICD-10-CM | POA: Diagnosis present

## 2023-02-06 DIAGNOSIS — B348 Other viral infections of unspecified site: Secondary | ICD-10-CM | POA: Diagnosis not present

## 2023-02-06 DIAGNOSIS — R140 Abdominal distension (gaseous): Secondary | ICD-10-CM

## 2023-02-06 DIAGNOSIS — E872 Acidosis, unspecified: Secondary | ICD-10-CM | POA: Diagnosis present

## 2023-02-06 DIAGNOSIS — E669 Obesity, unspecified: Secondary | ICD-10-CM | POA: Diagnosis present

## 2023-02-06 DIAGNOSIS — B9789 Other viral agents as the cause of diseases classified elsewhere: Secondary | ICD-10-CM | POA: Diagnosis present

## 2023-02-06 DIAGNOSIS — R000 Tachycardia, unspecified: Secondary | ICD-10-CM | POA: Diagnosis not present

## 2023-02-06 DIAGNOSIS — R0603 Acute respiratory distress: Secondary | ICD-10-CM

## 2023-02-06 DIAGNOSIS — J690 Pneumonitis due to inhalation of food and vomit: Secondary | ICD-10-CM | POA: Diagnosis not present

## 2023-02-06 DIAGNOSIS — R0689 Other abnormalities of breathing: Secondary | ICD-10-CM | POA: Diagnosis not present

## 2023-02-06 DIAGNOSIS — F0283 Dementia in other diseases classified elsewhere, unspecified severity, with mood disturbance: Secondary | ICD-10-CM | POA: Diagnosis present

## 2023-02-06 DIAGNOSIS — M79671 Pain in right foot: Secondary | ICD-10-CM | POA: Diagnosis present

## 2023-02-06 DIAGNOSIS — F418 Other specified anxiety disorders: Secondary | ICD-10-CM | POA: Diagnosis present

## 2023-02-06 DIAGNOSIS — F0284 Dementia in other diseases classified elsewhere, unspecified severity, with anxiety: Secondary | ICD-10-CM | POA: Diagnosis present

## 2023-02-06 DIAGNOSIS — M25571 Pain in right ankle and joints of right foot: Secondary | ICD-10-CM | POA: Diagnosis present

## 2023-02-06 DIAGNOSIS — Z9221 Personal history of antineoplastic chemotherapy: Secondary | ICD-10-CM

## 2023-02-06 DIAGNOSIS — Z955 Presence of coronary angioplasty implant and graft: Secondary | ICD-10-CM

## 2023-02-06 DIAGNOSIS — Z79890 Hormone replacement therapy (postmenopausal): Secondary | ICD-10-CM

## 2023-02-06 DIAGNOSIS — J1289 Other viral pneumonia: Secondary | ICD-10-CM | POA: Diagnosis present

## 2023-02-06 DIAGNOSIS — I480 Paroxysmal atrial fibrillation: Secondary | ICD-10-CM | POA: Diagnosis present

## 2023-02-06 DIAGNOSIS — J90 Pleural effusion, not elsewhere classified: Secondary | ICD-10-CM | POA: Diagnosis not present

## 2023-02-06 DIAGNOSIS — F03B0 Unspecified dementia, moderate, without behavioral disturbance, psychotic disturbance, mood disturbance, and anxiety: Secondary | ICD-10-CM | POA: Diagnosis not present

## 2023-02-06 DIAGNOSIS — R296 Repeated falls: Secondary | ICD-10-CM | POA: Diagnosis present

## 2023-02-06 DIAGNOSIS — Z96642 Presence of left artificial hip joint: Secondary | ICD-10-CM | POA: Diagnosis present

## 2023-02-06 DIAGNOSIS — Z1152 Encounter for screening for COVID-19: Secondary | ICD-10-CM

## 2023-02-06 DIAGNOSIS — Z888 Allergy status to other drugs, medicaments and biological substances status: Secondary | ICD-10-CM

## 2023-02-06 DIAGNOSIS — M25562 Pain in left knee: Secondary | ICD-10-CM | POA: Diagnosis present

## 2023-02-06 DIAGNOSIS — Z853 Personal history of malignant neoplasm of breast: Secondary | ICD-10-CM

## 2023-02-06 LAB — PROTIME-INR
INR: 1.3 — ABNORMAL HIGH (ref 0.8–1.2)
Prothrombin Time: 15.9 s — ABNORMAL HIGH (ref 11.4–15.2)

## 2023-02-06 LAB — CBC WITH DIFFERENTIAL/PLATELET
Abs Immature Granulocytes: 0.16 10*3/uL — ABNORMAL HIGH (ref 0.00–0.07)
Basophils Absolute: 0.1 10*3/uL (ref 0.0–0.1)
Basophils Relative: 0 %
Eosinophils Absolute: 0.1 10*3/uL (ref 0.0–0.5)
Eosinophils Relative: 0 %
HCT: 33.8 % — ABNORMAL LOW (ref 36.0–46.0)
Hemoglobin: 11.7 g/dL — ABNORMAL LOW (ref 12.0–15.0)
Immature Granulocytes: 1 %
Lymphocytes Relative: 4 %
Lymphs Abs: 1 10*3/uL (ref 0.7–4.0)
MCH: 31.3 pg (ref 26.0–34.0)
MCHC: 34.6 g/dL (ref 30.0–36.0)
MCV: 90.4 fL (ref 80.0–100.0)
Monocytes Absolute: 0.9 10*3/uL (ref 0.1–1.0)
Monocytes Relative: 4 %
Neutro Abs: 20.7 10*3/uL — ABNORMAL HIGH (ref 1.7–7.7)
Neutrophils Relative %: 91 %
Platelets: 354 10*3/uL (ref 150–400)
RBC: 3.74 MIL/uL — ABNORMAL LOW (ref 3.87–5.11)
RDW: 15 % (ref 11.5–15.5)
WBC: 22.9 10*3/uL — ABNORMAL HIGH (ref 4.0–10.5)
nRBC: 0 % (ref 0.0–0.2)

## 2023-02-06 LAB — LACTIC ACID, PLASMA: Lactic Acid, Venous: 2.2 mmol/L (ref 0.5–1.9)

## 2023-02-06 LAB — APTT: aPTT: 32 s (ref 24–36)

## 2023-02-06 MED ORDER — HEPARIN (PORCINE) 25000 UT/250ML-% IV SOLN
850.0000 [IU]/h | INTRAVENOUS | Status: DC
  Administered 2023-02-06: 850 [IU]/h via INTRAVENOUS
  Filled 2023-02-06: qty 250

## 2023-02-06 MED ORDER — HEPARIN BOLUS VIA INFUSION
3000.0000 [IU] | Freq: Once | INTRAVENOUS | Status: AC
  Administered 2023-02-06: 3000 [IU] via INTRAVENOUS
  Filled 2023-02-06: qty 3000

## 2023-02-06 MED ORDER — SODIUM CHLORIDE 0.9 % IV SOLN
2.0000 g | Freq: Once | INTRAVENOUS | Status: AC
  Administered 2023-02-07: 2 g via INTRAVENOUS
  Filled 2023-02-06: qty 12.5

## 2023-02-06 MED ORDER — VANCOMYCIN HCL 1250 MG/250ML IV SOLN
1250.0000 mg | Freq: Once | INTRAVENOUS | Status: AC
  Administered 2023-02-07: 1250 mg via INTRAVENOUS
  Filled 2023-02-06: qty 250

## 2023-02-06 MED ORDER — SODIUM CHLORIDE 0.9 % IV BOLUS (SEPSIS)
1500.0000 mL | Freq: Once | INTRAVENOUS | Status: AC
  Administered 2023-02-06: 1500 mL via INTRAVENOUS

## 2023-02-06 NOTE — Progress Notes (Signed)
 ANTICOAGULATION CONSULT NOTE  Pharmacy Consult for heparin  infusion Indication: atrial fibrillation  Allergies  Allergen Reactions   Atorvastatin Cough    Patient Measurements: Height: 5' 2 (157.5 cm) Weight: 58 kg (127 lb 13.9 oz) IBW/kg (Calculated) : 50.1 Heparin  Dosing Weight: 58 kg  Vital Signs: Temp: 98 F (36.7 C) (01/06 2303) Temp Source: Oral (01/06 2303) BP: 117/73 (01/06 2303) Pulse Rate: 114 (01/06 2303)  Labs: No results for input(s): HGB, HCT, PLT, APTT, LABPROT, INR, HEPARINUNFRC, HEPRLOWMOCWT, CREATININE, CKTOTAL, CKMB, TROPONINIHS in the last 72 hours.  CrCl cannot be calculated (Patient's most recent lab result is older than the maximum 21 days allowed.).   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 Fib, found w/ heart rate of 100 to 170.  Goal of Therapy:  Heparin  level 0.3-0.7 units/ml Monitor platelets by anticoagulation protocol: Yes   Plan:  Bolus 3000 units x 1 Start heparin  infusion at 850 units/hr Will check HL in 8 hr after start of infusion CBC daily while on heparin   Rankin CANDIE Dills, PharmD, [facility] 02/06/2023 11:15 PM

## 2023-02-06 NOTE — Progress Notes (Signed)
 ED Pharmacy Antibiotic Sign Off An antibiotic consult was received from an ED provider for Vancomycin  per pharmacy dosing for sepsis. A chart review was completed to assess appropriateness.   The following one time order(s) were placed:  Vancomycin  1250 mg per pt wt: 58 kg  Further antibiotic and/or antibiotic pharmacy consults should be ordered by the admitting provider if indicated.   Thank you for allowing pharmacy to be a part of this patient's care.   Thank you, Rankin CANDIE Dills, PharmD, [facility] 02/06/2023 11:52 PM

## 2023-02-06 NOTE — ED Provider Notes (Addendum)
 [facility] Provider Note    Event Date/Time   First MD Initiated Contact with Patient 02/06/23 2257     (approximate)   History   Shortness of Breath   HPI  Lisa Martin is a 89 y.o. female   Past medical history of breast cancer, dementia, thyroid  disease, who presents to the emergency department with sepsis alert by EMS from her care facility.  Noted to be breathing fast, tachycardic, hypoxemic requiring new oxygen.  She complains of bilateral knee pain and abdominal distention and pain.  She is confused with a baseline dementia.  She does not complain of shortness of breath, reports no respiratory infectious symptoms, no urinary symptoms.   She does have a history of breast cancer and medical chart review notes history of radiation, chemotherapy, but I see no recent notes from oncology denoting any active chemotherapy and hospitalization record from the summertime of 2024 notes no indication of ongoing cancer treatment.  Outside record review:  I reviewed the visit from April 2024 internal medicine Dr. Cleotilde including past medical history and medications, noted to be on diltiazem  but no recorded history of atrial fibrillation.     Physical Exam   Triage Vital Signs: ED Triage Vitals [02/06/23 2303]  Encounter Vitals Group     BP      Systolic BP Percentile      Diastolic BP Percentile      Pulse      Resp      Temp      Temp src      SpO2 100 %     Weight      Height      Head Circumference      Peak Flow      Pain Score      Pain Loc      Pain Education      Exclude from Growth Chart     Most recent vital signs: Vitals:   02/06/23 2303  BP: 117/73  Pulse: (!) 114  Resp: (!) 43  Temp: 98 F (36.7 C)  SpO2: 100%    General: Awake, no distress.  CV:  Good peripheral perfusion.  Resp:  Normal effort.  Abd:  No distention.  Other:  Awake alert answering my questions, disoriented.  She has distention to her abdomen, more to  the left side which is mildly tender to palpation without rigidity or guarding.  She has no obvious wheezing or focality to lung exam.  She is tachycardic and tachypneic.  She is requiring 4 L nasal cannula to maintain oxygen saturations above 95%.  She has healing skin tears with no obvious infectious changes to bilateral shins.   ED Results / Procedures / Treatments   Labs (all labs ordered are listed, but only abnormal results are displayed) Labs Reviewed  CBC WITH DIFFERENTIAL/PLATELET - Abnormal; Notable for the following components:      Result Value   WBC 22.9 (*)    RBC 3.74 (*)    Hemoglobin 11.7 (*)    HCT 33.8 (*)    Neutro Abs 20.7 (*)    Abs Immature Granulocytes 0.16 (*)    All other components within normal limits  CULTURE, BLOOD (ROUTINE X 2)  CULTURE, BLOOD (ROUTINE X 2)  RESP PANEL BY RT-PCR (RSV, FLU A&B, COVID)  [id number]  LACTIC ACID, PLASMA  LACTIC ACID, PLASMA  COMPREHENSIVE METABOLIC PANEL  PROTIME-INR  APTT  URINALYSIS, W/ REFLEX TO CULTURE (INFECTION SUSPECTED)  TSH  T4, FREE  TROPONIN I (HIGH SENSITIVITY)      EKG  ED ECG REPORT I, Ginnie Shams, the attending physician, personally viewed and interpreted this ECG.   Date: 02/06/2023  EKG Time: 2259  Rate: 142  Rhythm: AF RVR  Axis: nl  Intervals:none  ST&T Change: no stemi    PROCEDURES:  Critical Care performed: Yes, see critical care procedure note(s)  .Critical Care  Performed by: Shams Ginnie, MD Authorized by: Shams Ginnie, MD   Critical care provider statement:    Critical care time (minutes):  30   Critical care was time spent personally by me on the following activities:  Development of treatment plan with patient or surrogate, discussions with consultants, evaluation of patient's response to treatment, examination of patient, ordering and review of laboratory studies, ordering and review of radiographic studies, ordering and performing treatments and interventions, pulse oximetry,  re-evaluation of patient's condition and review of old charts    MEDICATIONS ORDERED IN ED: Medications  sodium chloride  0.9 % bolus 1,500 mL (1,500 mLs Intravenous New Bag/Given 02/06/23 2327)  heparin  ADULT infusion 100 units/mL (25000 units/250mL) (850 Units/hr Intravenous New Bag/Given 02/06/23 2333)  ceFEPIme  (MAXIPIME ) 2 g in sodium chloride  0.9 % 100 mL IVPB (has no administration in time range)  heparin  bolus via infusion 3,000 Units (3,000 Units Intravenous Bolus from Bag 02/06/23 2334)     IMPRESSION / MDM / ASSESSMENT AND PLAN / ED COURSE  I reviewed the triage vital signs and the nursing notes.                                Patient's presentation is most consistent with acute presentation with potential threat to life or bodily function.  Differential diagnosis includes, but is not limited to, sepsis, infection, atrial fibrillation with RVR, dehydration, electrolyte disturbance, respiratory infection, intra-abdominal infection, obstruction, PE   The patient is on the cardiac monitor to evaluate for evidence of arrhythmia and/or significant heart rate changes.  MDM:    Broad differential diagnosis in this demented patient who has increased respiratory rate and tachycardia with what appears to be new onset atrial fibrillation.  She has a cancer history not actively being treated to the best of my medical chart review, at high risk for pulmonary embolism so we will get a CT angiogram.  Get sepsis labs, she appears slightly dehydrated with increased heart rate will give sepsis bolus of 30 cc/kg NS per ideal body weight, started on heparin  for atrial fibrillation, maintain O2 sat saturating well on 6 L nasal cannula.  She comes with paperwork denoting DNR status.  Will require admission.   -- Workup thus far with leukocytosis and patchy infiltrate in the right upper lobe suggestive of pneumonia.  I think this matches with her tachypnea and hypoxemic respiratory failure.  She was  ordered for vancomycin  and cefepime .  The remainder of her testing is pending, will await the results of CT scans prior to admission to hospital service.      FINAL CLINICAL IMPRESSION(S) / ED DIAGNOSES   Final diagnoses:  Acute respiratory failure with hypoxia (HCC)  Tachypnea  Atrial fibrillation with RVR (HCC)  Abdominal distension     Rx / DC Orders   ED Discharge Orders     None        Note:  This document was prepared using Dragon voice recognition software and may include unintentional dictation errors.  Cyrena Mylar, MD 02/06/23 LARINDA    Cyrena Mylar, MD 02/06/23 [phone]

## 2023-02-06 NOTE — ED Triage Notes (Signed)
 Patient to ED via [facility] from [facility] memory care unit. Staff at the facility noticed patient had increased RR. Upon assessment, they found patient's O2 sat to be 89%. The facility placed the patient on 4L of oxygen where she came up to 90%. With EMS, patient was placed on 6L and maintained an O2 sat of 95%. Patient had a HR of 100 to 170. Patient has Hx of afib. Patient also has a distended abdomen and states it is painful. Patient has Hx of dementia.

## 2023-02-07 ENCOUNTER — Inpatient Hospital Stay: Payer: PPO

## 2023-02-07 ENCOUNTER — Inpatient Hospital Stay (HOSPITAL_COMMUNITY)
Admit: 2023-02-07 | Discharge: 2023-02-07 | Disposition: A | Discharge status: Home / Self Care | Payer: PPO | Attending: Internal Medicine

## 2023-02-07 ENCOUNTER — Emergency Department: Payer: PPO

## 2023-02-07 DIAGNOSIS — R1312 Dysphagia, oropharyngeal phase: Secondary | ICD-10-CM | POA: Diagnosis not present

## 2023-02-07 DIAGNOSIS — F03B18 Unspecified dementia, moderate, with other behavioral disturbance: Secondary | ICD-10-CM | POA: Diagnosis not present

## 2023-02-07 DIAGNOSIS — J690 Pneumonitis due to inhalation of food and vomit: Secondary | ICD-10-CM | POA: Diagnosis not present

## 2023-02-07 DIAGNOSIS — I480 Paroxysmal atrial fibrillation: Secondary | ICD-10-CM | POA: Diagnosis not present

## 2023-02-07 DIAGNOSIS — F02811 Dementia in other diseases classified elsewhere, unspecified severity, with agitation: Secondary | ICD-10-CM | POA: Diagnosis not present

## 2023-02-07 DIAGNOSIS — A419 Sepsis, unspecified organism: Secondary | ICD-10-CM | POA: Diagnosis present

## 2023-02-07 DIAGNOSIS — R140 Abdominal distension (gaseous): Secondary | ICD-10-CM | POA: Diagnosis not present

## 2023-02-07 DIAGNOSIS — E860 Dehydration: Secondary | ICD-10-CM | POA: Diagnosis not present

## 2023-02-07 DIAGNOSIS — R627 Adult failure to thrive: Secondary | ICD-10-CM | POA: Diagnosis not present

## 2023-02-07 DIAGNOSIS — R6520 Severe sepsis without septic shock: Secondary | ICD-10-CM | POA: Diagnosis not present

## 2023-02-07 DIAGNOSIS — R109 Unspecified abdominal pain: Secondary | ICD-10-CM | POA: Diagnosis not present

## 2023-02-07 DIAGNOSIS — I4891 Unspecified atrial fibrillation: Secondary | ICD-10-CM

## 2023-02-07 DIAGNOSIS — F0283 Dementia in other diseases classified elsewhere, unspecified severity, with mood disturbance: Secondary | ICD-10-CM | POA: Diagnosis not present

## 2023-02-07 DIAGNOSIS — G309 Alzheimer's disease, unspecified: Secondary | ICD-10-CM | POA: Diagnosis not present

## 2023-02-07 DIAGNOSIS — N179 Acute kidney failure, unspecified: Secondary | ICD-10-CM

## 2023-02-07 DIAGNOSIS — E872 Acidosis, unspecified: Secondary | ICD-10-CM | POA: Diagnosis not present

## 2023-02-07 DIAGNOSIS — R0682 Tachypnea, not elsewhere classified: Secondary | ICD-10-CM | POA: Diagnosis not present

## 2023-02-07 DIAGNOSIS — M7989 Other specified soft tissue disorders: Secondary | ICD-10-CM | POA: Diagnosis not present

## 2023-02-07 DIAGNOSIS — E119 Type 2 diabetes mellitus without complications: Secondary | ICD-10-CM | POA: Diagnosis not present

## 2023-02-07 DIAGNOSIS — I10 Essential (primary) hypertension: Secondary | ICD-10-CM | POA: Diagnosis not present

## 2023-02-07 DIAGNOSIS — J9601 Acute respiratory failure with hypoxia: Secondary | ICD-10-CM | POA: Diagnosis not present

## 2023-02-07 DIAGNOSIS — F0284 Dementia in other diseases classified elsewhere, unspecified severity, with anxiety: Secondary | ICD-10-CM | POA: Diagnosis not present

## 2023-02-07 DIAGNOSIS — A4189 Other specified sepsis: Secondary | ICD-10-CM | POA: Diagnosis not present

## 2023-02-07 DIAGNOSIS — S2242XA Multiple fractures of ribs, left side, initial encounter for closed fracture: Secondary | ICD-10-CM | POA: Diagnosis not present

## 2023-02-07 DIAGNOSIS — Z66 Do not resuscitate: Secondary | ICD-10-CM | POA: Diagnosis not present

## 2023-02-07 DIAGNOSIS — E039 Hypothyroidism, unspecified: Secondary | ICD-10-CM | POA: Diagnosis not present

## 2023-02-07 DIAGNOSIS — M85871 Other specified disorders of bone density and structure, right ankle and foot: Secondary | ICD-10-CM | POA: Diagnosis not present

## 2023-02-07 DIAGNOSIS — M25571 Pain in right ankle and joints of right foot: Secondary | ICD-10-CM | POA: Diagnosis not present

## 2023-02-07 DIAGNOSIS — E669 Obesity, unspecified: Secondary | ICD-10-CM | POA: Diagnosis not present

## 2023-02-07 DIAGNOSIS — J189 Pneumonia, unspecified organism: Secondary | ICD-10-CM | POA: Diagnosis present

## 2023-02-07 DIAGNOSIS — R918 Other nonspecific abnormal finding of lung field: Secondary | ICD-10-CM | POA: Diagnosis not present

## 2023-02-07 DIAGNOSIS — I5032 Chronic diastolic (congestive) heart failure: Secondary | ICD-10-CM | POA: Diagnosis not present

## 2023-02-07 DIAGNOSIS — M79671 Pain in right foot: Secondary | ICD-10-CM | POA: Diagnosis not present

## 2023-02-07 DIAGNOSIS — S82402D Unspecified fracture of shaft of left fibula, subsequent encounter for closed fracture with routine healing: Secondary | ICD-10-CM | POA: Diagnosis not present

## 2023-02-07 DIAGNOSIS — M1711 Unilateral primary osteoarthritis, right knee: Secondary | ICD-10-CM | POA: Diagnosis not present

## 2023-02-07 DIAGNOSIS — Z1152 Encounter for screening for COVID-19: Secondary | ICD-10-CM | POA: Diagnosis not present

## 2023-02-07 DIAGNOSIS — B348 Other viral infections of unspecified site: Secondary | ICD-10-CM | POA: Insufficient documentation

## 2023-02-07 DIAGNOSIS — J1289 Other viral pneumonia: Secondary | ICD-10-CM | POA: Diagnosis not present

## 2023-02-07 DIAGNOSIS — M25561 Pain in right knee: Secondary | ICD-10-CM | POA: Diagnosis not present

## 2023-02-07 DIAGNOSIS — R0602 Shortness of breath: Secondary | ICD-10-CM | POA: Diagnosis present

## 2023-02-07 DIAGNOSIS — E46 Unspecified protein-calorie malnutrition: Secondary | ICD-10-CM | POA: Diagnosis not present

## 2023-02-07 DIAGNOSIS — Z515 Encounter for palliative care: Secondary | ICD-10-CM | POA: Diagnosis not present

## 2023-02-07 LAB — HEPARIN LEVEL (UNFRACTIONATED)
Heparin Unfractionated: 0.33 [IU]/mL (ref 0.30–0.70)
Heparin Unfractionated: 0.75 [IU]/mL — ABNORMAL HIGH (ref 0.30–0.70)

## 2023-02-07 LAB — BLOOD GAS, VENOUS
Acid-base deficit: 8 mmol/L — ABNORMAL HIGH (ref 0.0–2.0)
Acid-base deficit: 7.7 mmol/L — ABNORMAL HIGH (ref 0.0–2.0)
Bicarbonate: 18.4 mmol/L — ABNORMAL LOW (ref 20.0–28.0)
Bicarbonate: 18.3 mmol/L — ABNORMAL LOW (ref 20.0–28.0)
O2 Saturation: 75.1 %
O2 Saturation: 76.7 %
Patient temperature: 37
Patient temperature: 37
pCO2, Ven: 40 mm[Hg] — ABNORMAL LOW (ref 44–60)
pCO2, Ven: 38 mm[Hg] — ABNORMAL LOW (ref 44–60)
pH, Ven: 7.27 (ref 7.25–7.43)
pH, Ven: 7.29 (ref 7.25–7.43)
pO2, Ven: 43 mm[Hg] (ref 32–45)
pO2, Ven: 45 mm[Hg] (ref 32–45)

## 2023-02-07 LAB — RESP PANEL BY RT-PCR (RSV, FLU A&B, COVID)  RVPGX2
Influenza A by PCR: NEGATIVE
Influenza B by PCR: NEGATIVE
Resp Syncytial Virus by PCR: NEGATIVE
SARS Coronavirus 2 by RT PCR: NEGATIVE

## 2023-02-07 LAB — RESPIRATORY PANEL BY PCR

## 2023-02-07 LAB — TROPONIN I (HIGH SENSITIVITY)
Troponin I (High Sensitivity): 21 ng/L — ABNORMAL HIGH (ref <18)
Troponin I (High Sensitivity): 37 ng/L — ABNORMAL HIGH (ref <18)

## 2023-02-07 LAB — ECHOCARDIOGRAM COMPLETE
AR max vel: 2.79 cm2
AV Area VTI: 2.27 cm2
AV Area mean vel: 2.77 cm2
AV Mean grad: 2 mm[Hg]
AV Peak grad: 5.5 mm[Hg]
Ao pk vel: 1.17 m/s
Area-P 1/2: 4.24 cm2
Est EF: >55
Height: 62 in
MV VTI: 1.6 cm2
S' Lateral: 2.5 cm
Weight: 2045.87 [oz_av]

## 2023-02-07 LAB — COMPREHENSIVE METABOLIC PANEL
ALT: 20 U/L (ref 0–44)
AST: 21 U/L (ref 15–41)
Albumin: 3.7 g/dL (ref 3.5–5.0)
Alkaline Phosphatase: 71 U/L (ref 38–126)
Anion gap: 13 (ref 5–15)
BUN: 31 mg/dL — ABNORMAL HIGH (ref 8–23)
CO2: 21 mmol/L — ABNORMAL LOW (ref 22–32)
Calcium: 8.6 mg/dL — ABNORMAL LOW (ref 8.9–10.3)
Chloride: 105 mmol/L (ref 98–111)
Creatinine, Ser: 0.95 mg/dL (ref 0.44–1.00)
GFR, Estimated: 57 mL/min — ABNORMAL LOW (ref >60)
Glucose, Bld: 177 mg/dL — ABNORMAL HIGH (ref 70–99)
Potassium: 4 mmol/L (ref 3.5–5.1)
Sodium: 139 mmol/L (ref 135–145)
Total Bilirubin: 1.1 mg/dL (ref 0.0–1.2)
Total Protein: 7.3 g/dL (ref 6.5–8.1)

## 2023-02-07 LAB — URINALYSIS, W/ REFLEX TO CULTURE (INFECTION SUSPECTED)
Bacteria, UA: NONE SEEN
Bilirubin Urine: NEGATIVE
Glucose, UA: NEGATIVE mg/dL
Hgb urine dipstick: NEGATIVE
Ketones, ur: NEGATIVE mg/dL
Leukocytes,Ua: NEGATIVE
Nitrite: NEGATIVE
Protein, ur: NEGATIVE mg/dL
Specific Gravity, Urine: >1.046 — ABNORMAL HIGH (ref 1.005–1.030)
pH: 5 (ref 5.0–8.0)

## 2023-02-07 LAB — TSH: TSH: 3.679 u[IU]/mL (ref 0.350–4.500)

## 2023-02-07 LAB — LACTIC ACID, PLASMA: Lactic Acid, Venous: 2.1 mmol/L (ref 0.5–1.9)

## 2023-02-07 LAB — T4, FREE: Free T4: 1.34 ng/dL — ABNORMAL HIGH (ref 0.61–1.12)

## 2023-02-07 LAB — STREP PNEUMONIAE URINARY ANTIGEN: Strep Pneumo Urinary Antigen: NEGATIVE

## 2023-02-07 LAB — MRSA NEXT GEN BY PCR, NASAL: MRSA by PCR Next Gen: NOT DETECTED

## 2023-02-07 MED ORDER — LEVOTHYROXINE SODIUM 25 MCG PO TABS
125.0000 ug | ORAL_TABLET | Freq: Every day | ORAL | Status: DC
  Administered 2023-02-07 – 2023-02-13 (×6): 125 ug via ORAL
  Filled 2023-02-07: qty 3
  Filled 2023-02-07 (×3): qty 1
  Filled 2023-02-07: qty 3
  Filled 2023-02-07: qty 1

## 2023-02-07 MED ORDER — QUETIAPINE FUMARATE 25 MG PO TABS
12.5000 mg | ORAL_TABLET | Freq: Every day | ORAL | Status: DC
  Administered 2023-02-07: 12.5 mg via ORAL
  Filled 2023-02-07: qty 1

## 2023-02-07 MED ORDER — SODIUM CHLORIDE 0.9 % IV SOLN
2.0000 g | INTRAVENOUS | Status: AC
  Administered 2023-02-07 – 2023-02-11 (×5): 2 g via INTRAVENOUS
  Filled 2023-02-07 (×5): qty 20

## 2023-02-07 MED ORDER — IOHEXOL 350 MG/ML SOLN
75.0000 mL | Freq: Once | INTRAVENOUS | Status: AC | PRN
  Administered 2023-02-07: 75 mL via INTRAVENOUS

## 2023-02-07 MED ORDER — METHYLPREDNISOLONE SODIUM SUCC 40 MG IJ SOLR
40.0000 mg | Freq: Two times a day (BID) | INTRAMUSCULAR | Status: DC
  Administered 2023-02-07 – 2023-02-10 (×6): 40 mg via INTRAVENOUS
  Filled 2023-02-07 (×6): qty 1

## 2023-02-07 MED ORDER — MEMANTINE HCL 10 MG PO TABS
5.0000 mg | ORAL_TABLET | Freq: Two times a day (BID) | ORAL | Status: DC
  Administered 2023-02-07 – 2023-02-13 (×12): 5 mg via ORAL
  Filled 2023-02-07 (×12): qty 1

## 2023-02-07 MED ORDER — ONDANSETRON HCL 4 MG/2ML IJ SOLN: 4.0000 mg | Freq: Four times a day (QID) | INTRAMUSCULAR | Status: DC | PRN

## 2023-02-07 MED ORDER — ONDANSETRON HCL 4 MG PO TABS: 4.0000 mg | ORAL_TABLET | Freq: Four times a day (QID) | ORAL | Status: DC | PRN

## 2023-02-07 MED ORDER — HALOPERIDOL LACTATE 5 MG/ML IJ SOLN: 1.0000 mg | Freq: Four times a day (QID) | INTRAMUSCULAR | Status: DC | PRN

## 2023-02-07 MED ORDER — SODIUM CHLORIDE 0.9 % IV SOLN: INTRAVENOUS | Status: AC

## 2023-02-07 MED ORDER — HEPARIN (PORCINE) 25000 UT/250ML-% IV SOLN
850.0000 [IU]/h | INTRAVENOUS | Status: DC
  Administered 2023-02-07: 800 [IU]/h via INTRAVENOUS
  Administered 2023-02-09 – 2023-02-10 (×2): 1050 [IU]/h via INTRAVENOUS
  Administered 2023-02-11 – 2023-02-13 (×2): 1000 [IU]/h via INTRAVENOUS
  Filled 2023-02-07 (×5): qty 250

## 2023-02-07 MED ORDER — LACTATED RINGERS IV SOLN
150.0000 mL/h | INTRAVENOUS | Status: DC
Start: 2023-02-07 — End: 2023-02-07
  Administered 2023-02-07: 150 mL/h via INTRAVENOUS

## 2023-02-07 MED ORDER — ACETAMINOPHEN 325 MG PO TABS: 650.0000 mg | ORAL_TABLET | Freq: Four times a day (QID) | ORAL | Status: DC | PRN

## 2023-02-07 MED ORDER — SERTRALINE HCL 50 MG PO TABS
25.0000 mg | ORAL_TABLET | Freq: Every day | ORAL | Status: DC
  Administered 2023-02-07 – 2023-02-13 (×6): 25 mg via ORAL
  Filled 2023-02-07 (×6): qty 1

## 2023-02-07 MED ORDER — RIVASTIGMINE TARTRATE 3 MG PO CAPS
3.0000 mg | ORAL_CAPSULE | Freq: Two times a day (BID) | ORAL | Status: DC
  Administered 2023-02-07 (×2): 3 mg via ORAL
  Filled 2023-02-07 (×3): qty 1

## 2023-02-07 MED ORDER — SODIUM CHLORIDE 0.9 % IV SOLN
500.0000 mg | INTRAVENOUS | Status: DC
  Administered 2023-02-07 – 2023-02-09 (×3): 500 mg via INTRAVENOUS
  Filled 2023-02-07 (×3): qty 5

## 2023-02-07 MED ORDER — ACETAMINOPHEN 650 MG RE SUPP
650.0000 mg | Freq: Four times a day (QID) | RECTAL | Status: DC | PRN
  Administered 2023-02-13: 650 mg via RECTAL
  Filled 2023-02-07: qty 1

## 2023-02-07 NOTE — Assessment & Plan Note (Signed)
 Likely driven by acute infection with sepsis Continue sepsis fluids and assess for improvement Will consider initiating diltiazem  infusion if rate remains above 120 and if BP allows Continue heparin  infusion initiated in the ED for primary stroke prevention Echocardiogram Cardiology consult

## 2023-02-07 NOTE — ED Notes (Signed)
 Cardiologist at bedside.

## 2023-02-07 NOTE — Assessment & Plan Note (Addendum)
 Acute respiratory failure with hypoxia Patient presents with increased work of breathing, tachycardia and tachypnea, leukocytosis and lactic acidosis with pneumonia on chest CT, with new O2 requirement and AKI Rocephin  and azithromycin  Continue sepsis fluids Supplemental O2 and wean as tolerated Will keep n.p.o. pending SLP eval
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
## 2023-02-07 NOTE — ED Notes (Signed)
 Alerted MD to patients breathing. Expiratory wheezing. RR: 33, O2: 91% @ 5L [place]

## 2023-02-07 NOTE — Progress Notes (Signed)
 PROGRESS NOTE    Lisa Martin  FMW:[id number] DOB: [birth year] DOA: 02/06/2023 PCP: Cleotilde Oneil FALCON, MD     Brief Narrative:   From admission h and p  Lisa Martin is a 89 y.o. female with medical history significant for advanced dementia,  HTN, dCHF, hypothyroidism, depression with anxiety, GERD, breast cancer (s/p of radiation and chemotherapy), protein calorie malnutrition, prior falls with resulting hip fracture, rib fractures, with most recent fall 07/2022 with pelvic fracture treated nonoperatively, who presents from memory care unit with concerns for tachycardia and tachypnea with hypoxia to 89% requiring 6 L by EMS to maintain sats in the mid 90s.  She was also tachycardic to 170.  Patient unable to contribute to history due to dementia.    Assessment & Plan:   Principal Problem:   Severe sepsis due to pneumonia [facility]) Active Problems:   Acute respiratory failure with hypoxia (HCC)   Atrial fibrillation with rapid ventricular response, new onset (HCC)   AKI (acute kidney injury) (HCC)   Hypertension   Acquired hypothyroidism   Dementia (HCC)   Depression with anxiety   Personal history of malignant neoplasm of breast  # CAP Presenting with cough and dyspnea and hypoxia, CTA no PE but findings consistent with atypical infection. Covid/flu/rsv neg - continue ceftriaxone /azithromycin  - f/u rvp and mrsa swab - f/u urine antigens - f/u vbg  # Sepsis 2/2 pneumonia, by tachypnea and leukocytosis. Hemodynamically stable - continue gentle fluids and abx  # Acute hypoxic respiratory failure 2/2 pneumonia - continue [place] o2 - f/u vbg, will need to clarify with daughter whether would want to escalate to bipap if needed. Patient is dnr/dni  # A-fib with rvr Appears to be a new dx. Hr improved - continue heparin  - TTE pending - cardiology to see  # Right foot pain No erythema - x-ray  # Hypothyroid Tsh wnl - cont home levothyroxine   # Advanced alzheimer's dementia - cont  home memantine , seroquel , rivastigmine   # MDD - home sertraline    DVT prophylaxis: therapeutic heparin  Code Status: dnr/dni Family Communication: none at bedside. No answer when daughter called, message left  Level of care: Progressive Status is: Inpatient Remains inpatient appropriate because: severity of illness    Consultants:  none  Procedures: none  Antimicrobials:  See above    Subjective: confused, complaining of right ankle pain, speaking in complete sentences  Objective: Vitals:   02/07/23 0600 02/07/23 0700 02/07/23 0747 02/07/23 0800  BP: 117/70 132/81  117/72  Pulse:  91  95  Resp: (!) 23   18  Temp:   98.3 F (36.8 C)   TempSrc:   Oral   SpO2:  93%  96%  Weight:      Height:        Intake/Output Summary (Last 24 hours) at 02/07/2023 0854 Last data filed at 02/07/2023 9176 Gross per 24 hour  Intake 1445.85 ml  Output 100 ml  Net 1345.85 ml   Filed Weights   02/06/23 2311  Weight: 58 kg    Examination:  General exam: Appears calm and comfortable  Respiratory system: tachypneic, rhonchi throughout Cardiovascular system: S1 & S2 heard, RRR.  Gastrointestinal system: Abdomen is nondistended, soft and nontender.   Central nervous system: Alert, not oriented, moving all 4 Extremities: Symmetric 5 x 5 power. Pain with moving right ankle Skin: No visible rashes, lesions or ulcers Psychiatry: calm    Data Reviewed: I have personally reviewed following labs and imaging studies  CBC:  Recent Labs  Lab 02/06/23 2314  WBC 22.9*  NEUTROABS 20.7*  HGB 11.7*  HCT 33.8*  MCV 90.4  PLT 354   Basic Metabolic Panel: Recent Labs  Lab 02/06/23 2314  NA 139  K 4.0  CL 105  CO2 21*  GLUCOSE 177*  BUN 31*  CREATININE 0.95  CALCIUM 8.6*   GFR: Estimated Creatinine Clearance: 31.8 mL/min (by C-G formula based on SCr of 0.95 mg/dL). Liver Function Tests: Recent Labs  Lab 02/06/23 2314  AST 21  ALT 20  ALKPHOS 71  BILITOT 1.1  PROT 7.3   ALBUMIN 3.7   No results for input(s): LIPASE, AMYLASE in the last 168 hours. No results for input(s): AMMONIA in the last 168 hours. Coagulation Profile: Recent Labs  Lab 02/06/23 2314  INR 1.3*   Cardiac Enzymes: No results for input(s): CKTOTAL, CKMB, CKMBINDEX, TROPONINI in the last 168 hours. BNP (last 3 results) No results for input(s): PROBNP in the last 8760 hours. HbA1C: No results for input(s): HGBA1C in the last 72 hours. CBG: No results for input(s): GLUCAP in the last 168 hours. Lipid Profile: No results for input(s): CHOL, HDL, LDLCALC, TRIG, CHOLHDL, LDLDIRECT in the last 72 hours. Thyroid  Function Tests: Recent Labs    02/06/23 2314  TSH 3.679  FREET4 1.34*   Anemia Panel: No results for input(s): VITAMINB12, FOLATE, FERRITIN, TIBC, IRON, RETICCTPCT in the last 72 hours. Urine analysis:    Component Value Date/Time   COLORURINE YELLOW (A) 02/07/2023 0654   APPEARANCEUR CLEAR (A) 02/07/2023 0654   LABSPEC >1.046 (H) 02/07/2023 0654   PHURINE 5.0 02/07/2023 0654   GLUCOSEU NEGATIVE 02/07/2023 0654   HGBUR NEGATIVE 02/07/2023 0654   BILIRUBINUR NEGATIVE 02/07/2023 0654   KETONESUR NEGATIVE 02/07/2023 0654   PROTEINUR NEGATIVE 02/07/2023 0654   NITRITE NEGATIVE 02/07/2023 0654   LEUKOCYTESUR NEGATIVE 02/07/2023 0654   Sepsis Labs: @LABRCNTIP (procalcitonin:4,lacticidven:4)  ) Recent Results (from the past 240 hours)  Blood Culture (routine x 2)     Status: None (Preliminary result)   Collection Time: 02/06/23 11:14 PM   Specimen: BLOOD  Result Value Ref Range Status   Specimen Description BLOOD BLOOD RIGHT WRIST  Final   Special Requests   Final    BOTTLES DRAWN AEROBIC AND ANAEROBIC Blood Culture adequate volume   Culture   Final    NO GROWTH < 12 HOURS Performed at [facility], [address], [place], [place] [address]    Report Status PENDING  Incomplete  Blood Culture (routine x 2)      Status: None (Preliminary result)   Collection Time: 02/06/23 11:14 PM   Specimen: BLOOD  Result Value Ref Range Status   Specimen Description BLOOD BLOOD LEFT FOREARM  Final   Special Requests   Final    BOTTLES DRAWN AEROBIC AND ANAEROBIC Blood Culture adequate volume   Culture   Final    NO GROWTH < 12 HOURS Performed at [facility], [address], [place], [place] [address]    Report Status PENDING  Incomplete  Resp panel by RT-PCR (RSV, Flu A&B, Covid) Anterior Nasal Swab     Status: None   Collection Time: 02/06/23 11:35 PM   Specimen: Anterior Nasal Swab  Result Value Ref Range Status   SARS Coronavirus 2 by RT PCR NEGATIVE NEGATIVE Final    Comment: (NOTE) SARS-CoV-2 target nucleic acids are NOT DETECTED.  The SARS-CoV-2 RNA is generally detectable in upper respiratory specimens during the acute phase of infection.  The lowest concentration of SARS-CoV-2 viral copies this assay can detect is 138 copies/mL. A negative result does not preclude SARS-Cov-2 infection and should not be used as the sole basis for treatment or other patient management decisions. A negative result may occur with  improper specimen collection/handling, submission of specimen other than nasopharyngeal swab, presence of viral mutation(s) within the areas targeted by this assay, and inadequate number of viral copies(<138 copies/mL). A negative result must be combined with clinical observations, patient history, and epidemiological information. The expected result is Negative.  Fact Sheet for Patients:  bloggercourse.com  Fact Sheet for Healthcare Providers:  seriousbroker.it  This test is no t yet approved or cleared by the [place]  FDA and  has been authorized for detection and/or diagnosis of SARS-CoV-2 by FDA under an Emergency Use Authorization (EUA). This EUA will remain  in effect (meaning this test can be used) for the  duration of the COVID-19 declaration under Section 564(b)(1) of the Act, 21 U.S.C.section 360bbb-3(b)(1), unless the authorization is terminated  or revoked sooner.       Influenza A by PCR NEGATIVE NEGATIVE Final   Influenza B by PCR NEGATIVE NEGATIVE Final    Comment: (NOTE) The Xpert Xpress SARS-CoV-2/FLU/RSV plus assay is intended as an aid in the diagnosis of influenza from Nasopharyngeal swab specimens and should not be used as a sole basis for treatment. Nasal washings and aspirates are unacceptable for Xpert Xpress SARS-CoV-2/FLU/RSV testing.  Fact Sheet for Patients: bloggercourse.com  Fact Sheet for Healthcare Providers: seriousbroker.it  This test is not yet approved or cleared by the [place]  FDA and has been authorized for detection and/or diagnosis of SARS-CoV-2 by FDA under an Emergency Use Authorization (EUA). This EUA will remain in effect (meaning this test can be used) for the duration of the COVID-19 declaration under Section 564(b)(1) of the Act, 21 U.S.C. section 360bbb-3(b)(1), unless the authorization is terminated or revoked.     Resp Syncytial Virus by PCR NEGATIVE NEGATIVE Final    Comment: (NOTE) Fact Sheet for Patients: bloggercourse.com  Fact Sheet for Healthcare Providers: seriousbroker.it  This test is not yet approved or cleared by the [place]  FDA and has been authorized for detection and/or diagnosis of SARS-CoV-2 by FDA under an Emergency Use Authorization (EUA). This EUA will remain in effect (meaning this test can be used) for the duration of the COVID-19 declaration under Section 564(b)(1) of the Act, 21 U.S.C. section 360bbb-3(b)(1), unless the authorization is terminated or revoked.  Performed at [facility], [address], [place], [place] [address]          Radiology Studies: CT Angio Chest PE W/Cm  &/Or Wo Cm Result Date: 02/07/2023 CLINICAL DATA:  Abdominal pain, bowel obstruction suspected, sepsis, tachypneic and tachycardic. Hypoxemia requiring oxygen. Bilateral knee pain and abdominal distention and pain. PE suspected. EXAM: CT ANGIOGRAPHY CHEST CT ABDOMEN AND PELVIS WITH CONTRAST TECHNIQUE: Multidetector CT imaging of the chest was performed using the standard protocol during bolus administration of intravenous contrast. Multiplanar CT image reconstructions and MIPs were obtained to evaluate the vascular anatomy. Multidetector CT imaging of the abdomen and pelvis was performed using the standard protocol during bolus administration of intravenous contrast. RADIATION DOSE REDUCTION: This exam was performed according to the departmental dose-optimization program which includes automated exposure control, adjustment of the mA and/or kV according to patient size and/or use of iterative reconstruction technique. CONTRAST:  75mL OMNIPAQUE  IOHEXOL  350 MG/ML SOLN COMPARISON:  CT pelvis 07/14/2022 and CT chest  07/15/2016 FINDINGS: CTA CHEST FINDINGS Cardiovascular: Negative for acute pulmonary embolism. No pericardial effusion. Coronary artery and aortic atherosclerotic calcification. Mediastinum/Nodes: Esophagus is unremarkable. Nodules along the posterior trachea likely represent adherent debris. No lymphadenopathy. Lungs/Pleura: Respiratory motion obscures fine detail. Interlobular septal thickening and patchy ground-glass opacities. Bronchial wall thickening. Bibasilar atelectasis or infiltrates. No pleural effusion or pneumothorax. Musculoskeletal: Chronic left scapular and rib fractures. Subacute appearing left posterior 10th-11th rib fractures. Chronic appearing compression fractures of T7 and T8. T7 kyphoplasty. Postoperative changes at the thoracolumbar junction. Review of the MIP images confirms the above findings. CT ABDOMEN and PELVIS FINDINGS Hepatobiliary: Respiratory motion obscures detail in the  abdomen. No acute hepatobiliary abnormality. Pancreas: Unremarkable. Spleen: Unremarkable. Adrenals/Urinary Tract: Unremarkable adrenal glands. Bilateral small cystic lesions statistically likely to represent benign cysts. No follow-up recommended. Geographic hypoattenuation in the cortex of the left kidney may be due to artifact from streak and motion versus pyelonephritis. No hydronephrosis. Unremarkable bladder. Stomach/Bowel: Normal caliber large and small bowel. Extensive colonic diverticulosis without evidence of diverticulitis. Stomach is within normal limits. Vascular/Lymphatic: IVC filter. Aortic atherosclerosis. No enlarged abdominal or pelvic lymph nodes. Reproductive: No acute abnormality. Other: No free intraperitoneal fluid or air. Musculoskeletal: IM rod and screw fixation right femur. Left THA. Chronic right pubic rami fractures. No acute fracture. Review of the MIP images confirms the above findings. IMPRESSION: 1. Negative for acute pulmonary embolism. 2. Interlobular septal thickening and patchy ground-glass opacities with bibasilar atelectasis or infiltrates. Findings favor atypical infection. 3. Geographic hypoattenuation in the cortex of the left kidney may be due to streak artifact and motion versus pyelonephritis. Correlate with urinalysis. 4. Subacute appearing left posterior 10th-11th rib fractures. 5. Extensive colonic diverticulosis without evidence of diverticulitis. Aortic Atherosclerosis (ICD10-I70.0). Electronically Signed   By: Norman Gatlin M.D.   On: 02/07/2023 00:47   CT ABDOMEN PELVIS W CONTRAST Result Date: 02/07/2023 CLINICAL DATA:  Abdominal pain, bowel obstruction suspected, sepsis, tachypneic and tachycardic. Hypoxemia requiring oxygen. Bilateral knee pain and abdominal distention and pain. PE suspected. EXAM: CT ANGIOGRAPHY CHEST CT ABDOMEN AND PELVIS WITH CONTRAST TECHNIQUE: Multidetector CT imaging of the chest was performed using the standard protocol during bolus  administration of intravenous contrast. Multiplanar CT image reconstructions and MIPs were obtained to evaluate the vascular anatomy. Multidetector CT imaging of the abdomen and pelvis was performed using the standard protocol during bolus administration of intravenous contrast. RADIATION DOSE REDUCTION: This exam was performed according to the departmental dose-optimization program which includes automated exposure control, adjustment of the mA and/or kV according to patient size and/or use of iterative reconstruction technique. CONTRAST:  75mL OMNIPAQUE  IOHEXOL  350 MG/ML SOLN COMPARISON:  CT pelvis 07/14/2022 and CT chest 07/15/2016 FINDINGS: CTA CHEST FINDINGS Cardiovascular: Negative for acute pulmonary embolism. No pericardial effusion. Coronary artery and aortic atherosclerotic calcification. Mediastinum/Nodes: Esophagus is unremarkable. Nodules along the posterior trachea likely represent adherent debris. No lymphadenopathy. Lungs/Pleura: Respiratory motion obscures fine detail. Interlobular septal thickening and patchy ground-glass opacities. Bronchial wall thickening. Bibasilar atelectasis or infiltrates. No pleural effusion or pneumothorax. Musculoskeletal: Chronic left scapular and rib fractures. Subacute appearing left posterior 10th-11th rib fractures. Chronic appearing compression fractures of T7 and T8. T7 kyphoplasty. Postoperative changes at the thoracolumbar junction. Review of the MIP images confirms the above findings. CT ABDOMEN and PELVIS FINDINGS Hepatobiliary: Respiratory motion obscures detail in the abdomen. No acute hepatobiliary abnormality. Pancreas: Unremarkable. Spleen: Unremarkable. Adrenals/Urinary Tract: Unremarkable adrenal glands. Bilateral small cystic lesions statistically likely to represent benign cysts. No follow-up recommended. Geographic hypoattenuation  in the cortex of the left kidney may be due to artifact from streak and motion versus pyelonephritis. No hydronephrosis.  Unremarkable bladder. Stomach/Bowel: Normal caliber large and small bowel. Extensive colonic diverticulosis without evidence of diverticulitis. Stomach is within normal limits. Vascular/Lymphatic: IVC filter. Aortic atherosclerosis. No enlarged abdominal or pelvic lymph nodes. Reproductive: No acute abnormality. Other: No free intraperitoneal fluid or air. Musculoskeletal: IM rod and screw fixation right femur. Left THA. Chronic right pubic rami fractures. No acute fracture. Review of the MIP images confirms the above findings. IMPRESSION: 1. Negative for acute pulmonary embolism. 2. Interlobular septal thickening and patchy ground-glass opacities with bibasilar atelectasis or infiltrates. Findings favor atypical infection. 3. Geographic hypoattenuation in the cortex of the left kidney may be due to streak artifact and motion versus pyelonephritis. Correlate with urinalysis. 4. Subacute appearing left posterior 10th-11th rib fractures. 5. Extensive colonic diverticulosis without evidence of diverticulitis. Aortic Atherosclerosis (ICD10-I70.0). Electronically Signed   By: Norman Gatlin M.D.   On: 02/07/2023 00:47   DG Knee 2 Views Left Result Date: 02/07/2023 CLINICAL DATA:  Recent fall with knee pain, initial encounter EXAM: LEFT KNEE - 2 VIEW COMPARISON:  None Available. FINDINGS: Healed proximal fibular fracture is noted. No acute fracture or dislocation is noted. No soft tissue abnormality is seen. No joint effusion is noted. IMPRESSION: Healed proximal fibular fracture.  No acute abnormality noted. Electronically Signed   By: Oneil Devonshire M.D.   On: 02/07/2023 00:31   DG Knee 2 Views Right Result Date: 02/07/2023 CLINICAL DATA:  Fall, pain EXAM: RIGHT KNEE - 1-2 VIEW COMPARISON:  None Available. FINDINGS: Chondrocalcinosis and mild degenerative changes throughout the right knee. No acute bony abnormality. Specifically, no fracture, subluxation, or dislocation. No joint effusion. Vascular calcifications  noted. IMPRESSION: No acute bony abnormality. Electronically Signed   By: Franky Crease M.D.   On: 02/07/2023 00:31   DG Chest Port 1 View Result Date: 02/06/2023 CLINICAL DATA:  Questionable sepsis - evaluate for abnormality. Low O2 sats. EXAM: PORTABLE CHEST 1 VIEW COMPARISON:  07/14/2022 FINDINGS: Heart is borderline in size. Mediastinal contours within normal limits. Scattered aortic atherosclerosis. Questionable patchy opacity in the right upper lobe. No confluent opacity on the left. No effusions. No acute bony abnormality. Old healed left rib fractures. IMPRESSION: Questionable patchy right upper lobe opacity/infiltrate. Borderline heart size Electronically Signed   By: Franky Crease M.D.   On: 02/06/2023 23:28        Scheduled Meds: Continuous Infusions:  azithromycin  Stopped (02/07/23 0841)   cefTRIAXone  (ROCEPHIN )  IV     heparin  850 Units/hr (02/07/23 0737)   lactated ringers  150 mL/hr (02/07/23 0449)     LOS: 0 days     Devaughn KATHEE Ban, MD [facility]   If 7PM-7AM, please contact night-coverage [email or web address] Password [email or web address] 02/07/2023, 8:54 AM

## 2023-02-07 NOTE — ED Notes (Signed)
 Notified Wouk, MD of VBG resulting.

## 2023-02-07 NOTE — Assessment & Plan Note (Signed)
 No acute issues suspected CTA chest negative for PE

## 2023-02-07 NOTE — Progress Notes (Signed)
*  PRELIMINARY RESULTS* Echocardiogram 2D Echocardiogram has been performed.  Carolyne Fiscal 02/07/2023, 2:23 PM

## 2023-02-07 NOTE — ED Notes (Signed)
 Patient found at end of bed from scooting down. This RN and Yong Channel, RN in room to clean up patient as brief was soiled. New brief placed with a chuck pad underneath. Patient repositioned in bed and bed alarm turned on.

## 2023-02-07 NOTE — [place] FL2 (Signed)
 [place]  MEDICAID FL2 LEVEL OF CARE FORM     IDENTIFICATION  Patient Name: Lisa Martin Birthdate: April 27, 1931 Sex: female Admission Date (Current Location): 02/06/2023  [facility] and [place] Number:  Chiropodist and Address:  [facility], [address] [place], [place] [address]      Provider Number: [id number]  Attending Physician Name and Address:  Kandis Devaughn Sayres, MD  Relative Name and Phone Number:       Current Level of Care: Hospital Recommended Level of Care: Skilled Nursing Facility Prior Approval Number:    Date Approved/Denied:   PASRR Number: [id number]  Discharge Plan: SNF    Current Diagnoses: Patient Active Problem List   Diagnosis Date Noted   Severe sepsis due to pneumonia (HCC) 02/07/2023   Atrial fibrillation with rapid ventricular response, new onset (HCC) 02/07/2023   Acute respiratory failure with hypoxia (HCC) 02/07/2023   AKI (acute kidney injury) (HCC) 02/07/2023   Pelvic fracture (HCC) 07/14/2022   Falls frequently 07/14/2022   Intertrochanteric fracture of right hip, closed, initial encounter (HCC) 05/23/2022   Closed right hip fracture (HCC) 05/22/2022   Fall at home, initial encounter 05/22/2022   Leukocytosis 05/22/2022   Right rib fracture 05/22/2022   Chronic diastolic CHF (congestive heart failure) (HCC) 05/22/2022   Dementia (HCC) 05/22/2022   Depression with anxiety 05/22/2022   Rash 04/05/2021   Compression fracture of body of thoracic vertebra (HCC) 04/05/2021   Protein-calorie malnutrition, severe (HCC) 04/05/2021   Acute metabolic encephalopathy 03/27/2021   Severe sepsis (HCC) 03/27/2021   Community acquired pneumonia 03/27/2021   Incidental finding of COVID-19 virus infection 03/27/2021   Dehydration 03/27/2021   Acute prerenal azotemia 03/27/2021   Personal history of malignant neoplasm of breast 05/08/2013   Hypertension 2010   GERD (gastroesophageal reflux disease) 2010    Acquired hypothyroidism 2010    Orientation RESPIRATION BLADDER Height & Weight     Self  O2 (Nasal cannula 6 L. Will wean as tolerated.)   Weight: 127 lb 13.9 oz (58 kg) Height:  5' 2 (157.5 cm)  BEHAVIORAL SYMPTOMS/MOOD NEUROLOGICAL BOWEL NUTRITION STATUS   (None)  (Dementia)   Diet (DYS 2. Fluid nectar thick. Extra Gravy on meats, potatoes.  Yogurts TID meals. May have Cream Soups per Speech.)  AMBULATORY STATUS COMMUNICATION OF NEEDS Skin     Verbally Normal                       Personal Care Assistance Level of Assistance              Functional Limitations Info  Sight, Hearing, Speech Sight Info: Adequate Hearing Info: Adequate Speech Info: Adequate    SPECIAL CARE FACTORS FREQUENCY                       Contractures Contractures Info: Not present    Additional Factors Info  Code Status, Allergies, Isolation Precautions Code Status Info: DNR Allergies Info: Atorvastatin     Isolation Precautions Info: Droplet     Current Medications (02/07/2023):  This is the current hospital active medication list Current Facility-Administered Medications  Medication Dose Route Frequency Provider Last Rate Last Admin   0.9 %  sodium chloride  infusion   Intravenous Continuous Kandis Devaughn Sayres, MD 150 mL/hr at 02/07/23 1309 New Bag at 02/07/23 1309   acetaminophen  (TYLENOL ) tablet 650 mg  650 mg Oral Q6H PRN Duncan, Hazel V, MD  Or   acetaminophen  (TYLENOL ) suppository 650 mg  650 mg Rectal Q6H PRN Cleatus Delayne GAILS, MD       azithromycin  (ZITHROMAX ) 500 mg in sodium chloride  0.9 % 250 mL IVPB  500 mg Intravenous Q24H Duncan, Hazel V, MD   Stopped at 02/07/23 [id number]   cefTRIAXone  (ROCEPHIN ) 2 g in sodium chloride  0.9 % 100 mL IVPB  2 g Intravenous Q24H Duncan, Hazel V, MD   Stopped at 02/07/23 1200   haloperidol  lactate (HALDOL ) injection 1 mg  1 mg Intravenous Q6H PRN Duncan, Hazel V, MD       heparin  ADULT infusion 100 units/mL (25000 units/250mL)  850  Units/hr Intravenous Continuous Dail Rankin RAMAN, RPH 8.5 mL/hr at 02/07/23 0737 850 Units/hr at 02/07/23 0737   levothyroxine  (SYNTHROID ) tablet 125 mcg  125 mcg Oral Q0600 Kandis Devaughn Sayres, MD   125 mcg at 02/07/23 1018   memantine  (NAMENDA ) tablet 5 mg  5 mg Oral BID Kandis Devaughn Sayres, MD   5 mg at 02/07/23 1018   ondansetron  (ZOFRAN ) tablet 4 mg  4 mg Oral Q6H PRN Duncan, Hazel V, MD       Or   ondansetron  (ZOFRAN ) injection 4 mg  4 mg Intravenous Q6H PRN Duncan, Hazel V, MD       QUEtiapine  (SEROQUEL ) tablet 12.5 mg  12.5 mg Oral QHS Wouk, Devaughn Sayres, MD       rivastigmine  (EXELON ) capsule 3 mg  3 mg Oral BID Kandis Devaughn Sayres, MD   3 mg at 02/07/23 1018   sertraline  (ZOLOFT ) tablet 25 mg  25 mg Oral Daily Kandis Devaughn Sayres, MD   25 mg at 02/07/23 1018   Current Outpatient Medications  Medication Sig Dispense Refill   acetaminophen  (TYLENOL ) 500 MG tablet Take 1,000 mg by mouth every 8 (eight) hours.     albuterol  (VENTOLIN  HFA) 108 (90 Base) MCG/ACT inhaler Inhale 1 puff into the lungs every 6 (six) hours as needed for wheezing or shortness of breath.     amLODipine  (NORVASC ) 5 MG tablet Take 1 tablet (5 mg total) by mouth daily.     guaifenesin (ROBITUSSIN) 100 MG/5ML syrup Take 200 mg by mouth every 4 (four) hours as needed (As needed for expectorant.).     levothyroxine  (SYNTHROID ) 125 MCG tablet Take 125 mcg by mouth daily before breakfast.     megestrol  (MEGACE ) 400 MG/10ML suspension Take 10 mLs (400 mg total) by mouth daily. 240 mL 0   memantine  (NAMENDA ) 5 MG tablet Take 5 mg by mouth 2 (two) times daily.     Multiple Vitamin (MULTIVITAMIN WITH MINERALS) TABS tablet Take 1 tablet by mouth daily.     omeprazole (PRILOSEC) 20 MG capsule Take 20 mg by mouth daily.     QUEtiapine  (SEROQUEL ) 25 MG tablet Take 12.5 mg by mouth at bedtime.     rivastigmine  (EXELON ) 3 MG capsule Take 3 mg by mouth 2 (two) times daily.     senna-docusate (SENOKOT-S) 8.6-50 MG tablet Take 1 tablet by  mouth 2 (two) times daily.     sertraline  (ZOLOFT ) 25 MG tablet Take 25 mg by mouth daily.     Skin Protectants, Misc. (EUCERIN) cream Apply 1 Application topically daily. Apply to bilateral lower legs.     traMADol  (ULTRAM ) 50 MG tablet Take 1 tablet (50 mg total) by mouth every 12 (twelve) hours as needed for moderate pain. SNF use only.  Refills per SNF MD 4 tablet 0   vitamin B-12 (  CYANOCOBALAMIN ) 250 MCG tablet Take 1,000 mcg by mouth daily.      Vitamin D , Ergocalciferol , (DRISDOL ) 1.25 MG (50000 UNIT) CAPS capsule Take 50,000 Units by mouth every 7 (seven) days.     acetaminophen  (TYLENOL ) 325 MG tablet Take 1-2 tablets (325-650 mg total) by mouth every 6 (six) hours as needed for mild pain, moderate pain, fever or headache. (Patient not taking: Reported on 02/07/2023)     ALPRAZolam  (XANAX ) 0.25 MG tablet Take 1 tablet (0.25 mg total) by mouth 2 (two) times daily as needed for anxiety. SNF use only.  Refills per SNF MD (Patient not taking: Reported on 02/07/2023) 4 tablet 0   feeding supplement (ENSURE ENLIVE / ENSURE PLUS) LIQD Take 237 mLs by mouth 3 (three) times daily between meals. 237 mL 12   polyethylene glycol (MIRALAX  / GLYCOLAX ) 17 g packet Take 17 g by mouth daily as needed for mild constipation or moderate constipation. (Patient not taking: Reported on 02/07/2023) 14 each 0     Discharge Medications: Please see discharge summary for a list of discharge medications.  Relevant Imaging Results:  Relevant Lab Results:   Additional Information SS#: [id number]  Lauraine JAYSON Carpen, LCSW

## 2023-02-07 NOTE — ED Notes (Signed)
 Called Lab to check on VBG.

## 2023-02-07 NOTE — Assessment & Plan Note (Signed)
 Holding off on home amlodipine in the setting of severe sepsis

## 2023-02-07 NOTE — Progress Notes (Signed)
 ANTICOAGULATION CONSULT NOTE  Pharmacy Consult for heparin  infusion Indication: atrial fibrillation  Allergies  Allergen Reactions   Atorvastatin Cough   Patient Measurements: Height: 5' 2 (157.5 cm) Weight: 58 kg (127 lb 13.9 oz) IBW/kg (Calculated) : 50.1 Heparin  Dosing Weight: 58 kg  Vital Signs: Temp: 98.4 F (36.9 C) (01/07 1526) Temp Source: Oral (01/07 1526) BP: 150/72 (01/07 1445) Pulse Rate: 84 (01/07 1445)  Labs: Recent Labs    02/06/23 2314 02/07/23 0154 02/07/23 0740 02/07/23 1501  HGB 11.7*  --   --   --   HCT 33.8*  --   --   --   PLT 354  --   --   --   APTT 32  --   --   --   LABPROT 15.9*  --   --   --   INR 1.3*  --   --   --   HEPARINUNFRC  --   --  0.33 0.75*  CREATININE 0.95  --   --   --   TROPONINIHS 21* 37*  --   --     Estimated Creatinine Clearance: 31.8 mL/min (by C-G formula based on SCr of 0.95 mg/dL).  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 In the ED they were noted to have atrial fibrillation with rapid ventricular response, currently in sinus rhythm.   Goal of Therapy:  Heparin  level 0.3-0.7 units/ml Monitor platelets by anticoagulation protocol: Yes  Heparin   Levels Date/Time HL Clinical Assessment  1/7@0740  HL = 0.33 Therapeutic x 1  1/7@1501  HL = 0.75 SUPRAtherapeutic                Plan:  Decrease heparin  infusion at 800 units/ hr from 850 units/hr Will check confirmatory HL in 8 hr after rate change CBC daily while on heparin   Alfonso MARLA Buys, PharmD Pharmacy Resident  02/07/2023 4:15 PM

## 2023-02-07 NOTE — ED Notes (Addendum)
 Notified Wouk, MD of patients respiratory status. Via secure chat.

## 2023-02-07 NOTE — ED Notes (Signed)
 Daughter at bedside. Called pharmacy to clarify when to restart heparin, which was paused. Dr Ashok Pall is at bedside. Pulse ox monitor reapplied to R ear. Per pharmacy, restart heparin now @ 800 units/hour.

## 2023-02-07 NOTE — ED Notes (Addendum)
 Per Juline Patch, RPH to pause Heparin.

## 2023-02-07 NOTE — Assessment & Plan Note (Signed)
 Creatinine 0.95 up from baseline of 0.77 with elevated BUN of 31 Expecting improvement to baseline with IV fluid resuscitation and treatment of sepsis Continue to monitor, avoid nephrotoxins and renally dose meds
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
## 2023-02-07 NOTE — Progress Notes (Signed)
 ANTICOAGULATION CONSULT NOTE  Pharmacy Consult for heparin  infusion Indication: atrial fibrillation  Allergies  Allergen Reactions   Atorvastatin Cough    Patient Measurements: Height: 5' 2 (157.5 cm) Weight: 58 kg (127 lb 13.9 oz) IBW/kg (Calculated) : 50.1 Heparin  Dosing Weight: 58 kg  Vital Signs: Temp: 98.3 F (36.8 C) (01/07 0747) Temp Source: Oral (01/07 0747) BP: 117/72 (01/07 0800) Pulse Rate: 95 (01/07 0800)  Labs: Recent Labs    02/06/23 2314 02/07/23 0154 02/07/23 0740  HGB 11.7*  --   --   HCT 33.8*  --   --   PLT 354  --   --   APTT 32  --   --   LABPROT 15.9*  --   --   INR 1.3*  --   --   HEPARINUNFRC  --   --  0.33  CREATININE 0.95  --   --   TROPONINIHS 21* 37*  --     Estimated Creatinine Clearance: 31.8 mL/min (by C-G formula based on SCr of 0.95 mg/dL).   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 Fib, found w/ heart rate of 100 to 170.  Goal of Therapy:  Heparin  level 0.3-0.7 units/ml Monitor platelets by anticoagulation protocol: Yes   01/07@0740 : HL 0.33 Therapeutic x 1  Plan:  Continue heparin  infusion at 850 units/hr Will check confirmatory HL in 8 hr CBC daily while on  heparin   Lisa Martin A Giulian Goldring, PharmD Clinical Pharmacist 02/07/2023 8:22 AM

## 2023-02-07 NOTE — Assessment & Plan Note (Signed)
 Continue Synthroid

## 2023-02-07 NOTE — TOC Initial Note (Signed)
 Transition of Care [facility]) - Initial/Assessment Note    Patient Details  Name: Lisa Martin MRN: [id number] Date of Birth: 1931-11-02  Transition of Care [facility]) CM/SW Contact:    Lauraine JAYSON Carpen, LCSW Phone Number: 02/07/2023, 1:40 PM  Clinical Narrative:  Patient not fully oriented. No family at bedside. CSW called daughter and [facility] admissions coordinators who both confirmed patient is a long-term resident on their memory care side. No further concerns. CSW will follow patient's progress and facilitate return to SNF once medically stable.                Expected Discharge Plan: Skilled Nursing Facility Barriers to Discharge: Continued Medical Work up   Patient Goals and CMS Choice            Expected Discharge Plan and Services     Post Acute Care Choice: Resumption of Svcs/PTA Provider Living arrangements for the past 2 months: Skilled Nursing Facility                                      Prior Living Arrangements/Services Living arrangements for the past 2 months: Skilled Nursing Facility Lives with:: Facility Resident Patient language and need for interpreter reviewed:: Yes Do you feel safe going back to the place where you live?: Yes      Need for Family Participation in Patient Care: Yes (Comment) Care giver support system in place?: Yes (comment)   Criminal Activity/Legal Involvement Pertinent to Current Situation/Hospitalization: No - Comment as needed  Activities of Daily Living      Permission Sought/Granted Permission sought to share information with : Facility Medical Sales Representative, Family Supports    Share Information with NAME: Rock Sharps  Permission granted to share info w AGENCY: [facility]  Permission granted to share info w Relationship: Daughter  Permission granted to share info w Contact Information: [phone]  Emotional Assessment Appearance:: Appears stated age Attitude/Demeanor/Rapport: Unable to Assess Affect  (typically observed): Unable to Assess Orientation: : Oriented to Self Alcohol  / Substance Use: Not Applicable Psych Involvement: No (comment)  Admission diagnosis:  Sepsis due to pneumonia (HCC) [J18.9, A41.9] Patient Active Problem List   Diagnosis Date Noted   Severe sepsis due to pneumonia (HCC) 02/07/2023   Atrial fibrillation with rapid ventricular response, new onset (HCC) 02/07/2023   Acute respiratory failure with hypoxia (HCC) 02/07/2023   AKI (acute kidney injury) (HCC) 02/07/2023   Pelvic fracture (HCC) 07/14/2022   Falls frequently 07/14/2022   Intertrochanteric fracture of right hip, closed, initial encounter (HCC) 05/23/2022   Closed right hip fracture (HCC) 05/22/2022   Fall at home, initial encounter 05/22/2022   Leukocytosis 05/22/2022   Right rib fracture 05/22/2022   Chronic diastolic CHF (congestive heart failure) (HCC) 05/22/2022   Dementia (HCC) 05/22/2022   Depression with anxiety 05/22/2022   Rash 04/05/2021   Compression fracture of body of thoracic vertebra (HCC) 04/05/2021   Protein-calorie malnutrition, severe (HCC) 04/05/2021   Acute metabolic encephalopathy 03/27/2021   Severe sepsis (HCC) 03/27/2021   Community acquired pneumonia 03/27/2021   Incidental finding of COVID-19 virus infection 03/27/2021   Dehydration 03/27/2021   Acute prerenal azotemia 03/27/2021   Personal history of malignant neoplasm of breast 05/08/2013   Hypertension 2010   GERD (gastroesophageal reflux disease) 2010   Acquired hypothyroidism 2010   PCP:  Cleotilde Oneil FALCON, MD Pharmacy:   [facility] [id number],  [place] - [address] [place] [address] Phone: [phone] Fax: [phone]  [id number] [address], [place] - [address] AT [facility] OF [address] [place] [address] Phone: [phone] Fax: [phone]     Social Drivers of Health (SDOH) Social History: SDOH Screenings    Food Insecurity: No Food Insecurity (07/15/2022)  Housing: Low Risk  (07/15/2022)  Transportation Needs: No Transportation Needs (07/15/2022)  Utilities: Not At Risk (07/15/2022)  Tobacco Use: Medium Risk (02/06/2023)   SDOH Interventions:     Readmission Risk Interventions     No data to display

## 2023-02-07 NOTE — Assessment & Plan Note (Addendum)
 Depression and anxiety History of frequent falls To resume home meds once safe to swallow including sertraline, rivastigmine, quetiapine, memantine and alprazolam Delirium precautions Fall precautions Haldol as needed

## 2023-02-07 NOTE — ED Notes (Signed)
 ED is out of LR. Spoke with Dr Ashok Pall, ok to change order to NS infusion at same rate.
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
## 2023-02-07 NOTE — ED Notes (Signed)
Informed RN bed assigned 

## 2023-02-07 NOTE — ED Notes (Signed)
 Recollected VBG, Called RT. Turned patient up to 6L [place].

## 2023-02-07 NOTE — Hospital Course (Signed)
 Marland Kitchen

## 2023-02-08 DIAGNOSIS — R0603 Acute respiratory distress: Secondary | ICD-10-CM

## 2023-02-08 DIAGNOSIS — F03B0 Unspecified dementia, moderate, without behavioral disturbance, psychotic disturbance, mood disturbance, and anxiety: Secondary | ICD-10-CM

## 2023-02-08 DIAGNOSIS — A419 Sepsis, unspecified organism: Secondary | ICD-10-CM

## 2023-02-08 DIAGNOSIS — I4891 Unspecified atrial fibrillation: Secondary | ICD-10-CM | POA: Diagnosis not present

## 2023-02-08 DIAGNOSIS — J9601 Acute respiratory failure with hypoxia: Secondary | ICD-10-CM | POA: Diagnosis not present

## 2023-02-08 DIAGNOSIS — J189 Pneumonia, unspecified organism: Secondary | ICD-10-CM

## 2023-02-08 LAB — CBC
HCT: 31.4 % — ABNORMAL LOW (ref 36.0–46.0)
Hemoglobin: 10.8 g/dL — ABNORMAL LOW (ref 12.0–15.0)
MCH: 30.9 pg (ref 26.0–34.0)
MCHC: 34.4 g/dL (ref 30.0–36.0)
MCV: 90 fL (ref 80.0–100.0)
Platelets: 326 10*3/uL (ref 150–400)
RBC: 3.49 MIL/uL — ABNORMAL LOW (ref 3.87–5.11)
RDW: 15.1 % (ref 11.5–15.5)
WBC: 20.1 10*3/uL — ABNORMAL HIGH (ref 4.0–10.5)
nRBC: 0 % (ref 0.0–0.2)

## 2023-02-08 LAB — BASIC METABOLIC PANEL
Anion gap: 12 (ref 5–15)
BUN: 16 mg/dL (ref 8–23)
CO2: 20 mmol/L — ABNORMAL LOW (ref 22–32)
Calcium: 8.8 mg/dL — ABNORMAL LOW (ref 8.9–10.3)
Chloride: 110 mmol/L (ref 98–111)
Creatinine, Ser: 0.64 mg/dL (ref 0.44–1.00)
GFR, Estimated: >60 mL/min (ref >60)
Glucose, Bld: 158 mg/dL — ABNORMAL HIGH (ref 70–99)
Potassium: 4 mmol/L (ref 3.5–5.1)
Sodium: 142 mmol/L (ref 135–145)

## 2023-02-08 LAB — HEPARIN LEVEL (UNFRACTIONATED)
Heparin Unfractionated: 0.3 [IU]/mL (ref 0.30–0.70)
Heparin Unfractionated: 0.21 [IU]/mL — ABNORMAL LOW (ref 0.30–0.70)
Heparin Unfractionated: 0.17 [IU]/mL — ABNORMAL LOW (ref 0.30–0.70)

## 2023-02-08 LAB — BLOOD GAS, VENOUS
Acid-base deficit: 5.9 mmol/L — ABNORMAL HIGH (ref 0.0–2.0)
Bicarbonate: 20.2 mmol/L (ref 20.0–28.0)
O2 Saturation: 91.9 %
Patient temperature: 37
pCO2, Ven: 41 mm[Hg] — ABNORMAL LOW (ref 44–60)
pH, Ven: 7.3 (ref 7.25–7.43)
pO2, Ven: 61 mm[Hg] — ABNORMAL HIGH (ref 32–45)

## 2023-02-08 MED ORDER — DILTIAZEM HCL ER COATED BEADS 120 MG PO CP24
120.0000 mg | ORAL_CAPSULE | Freq: Every day | ORAL | Status: DC
Start: 2023-02-08 — End: 2023-02-08
  Filled 2023-02-08: qty 1

## 2023-02-08 MED ORDER — DILTIAZEM HCL 30 MG PO TABS
60.0000 mg | ORAL_TABLET | Freq: Two times a day (BID) | ORAL | Status: DC
  Administered 2023-02-08 – 2023-02-10 (×6): 60 mg via ORAL
  Filled 2023-02-08 (×5): qty 2
  Filled 2023-02-08: qty 1

## 2023-02-08 MED ORDER — HEPARIN BOLUS VIA INFUSION
850.0000 [IU] | Freq: Once | INTRAVENOUS | Status: AC
  Administered 2023-02-08: 850 [IU] via INTRAVENOUS
  Filled 2023-02-08: qty 850

## 2023-02-08 MED ORDER — IPRATROPIUM BROMIDE 0.02 % IN SOLN
0.5000 mg | Freq: Two times a day (BID) | RESPIRATORY_TRACT | Status: DC
  Administered 2023-02-09: 0.5 mg via RESPIRATORY_TRACT
  Filled 2023-02-08: qty 2.5

## 2023-02-08 MED ORDER — IPRATROPIUM BROMIDE 0.02 % IN SOLN
0.5000 mg | Freq: Four times a day (QID) | RESPIRATORY_TRACT | Status: DC
  Administered 2023-02-08 (×3): 0.5 mg via RESPIRATORY_TRACT
  Filled 2023-02-08 (×3): qty 2.5

## 2023-02-08 MED ORDER — FUROSEMIDE 10 MG/ML IJ SOLN
40.0000 mg | Freq: Once | INTRAMUSCULAR | Status: AC
  Administered 2023-02-08: 40 mg via INTRAVENOUS
  Filled 2023-02-08: qty 4

## 2023-02-08 MED ORDER — HEPARIN BOLUS VIA INFUSION
1750.0000 [IU] | Freq: Once | INTRAVENOUS | Status: AC
  Administered 2023-02-08: 1750 [IU] via INTRAVENOUS
  Filled 2023-02-08: qty 1750

## 2023-02-08 NOTE — Progress Notes (Signed)
 Progress Note  Patient Name: Lisa Martin Date of Encounter: 02/08/2023  Primary Cardiologist: New - consult by End  Subjective   No acute overnight cardiac events. Has remained in sinus rhythm. Echo with preserved LV systolic function.   Inpatient Medications    Scheduled Meds:  levothyroxine   125 mcg Oral Q0600   memantine   5 mg Oral BID   methylPREDNISolone  (SOLU-MEDROL ) injection  40 mg Intravenous Q12H   QUEtiapine   12.5 mg Oral QHS   rivastigmine   3 mg Oral BID   sertraline   25 mg Oral Daily   Continuous Infusions:  sodium chloride  150 mL/hr at 02/07/23 1309   azithromycin  500 mg (02/08/23 9366)   cefTRIAXone  (ROCEPHIN )  IV Stopped (02/07/23 1200)   heparin  800 Units/hr (02/08/23 0405)   PRN Meds: acetaminophen  **OR** acetaminophen , haloperidol  lactate, ondansetron  **OR** ondansetron  (ZOFRAN ) IV   Vital Signs    Vitals:   02/08/23 0400 02/08/23 0412 02/08/23 0630 02/08/23 0700  BP: (!) 162/88  (!) 165/83 (!) 153/86  Pulse: 91  (!) 56 92  Resp: (!) 33  (!) 30 (!) 29  Temp:  97.6 F (36.4 C)    TempSrc:  Axillary    SpO2: 95%  97% 96%  Weight:      Height:        Intake/Output Summary (Last 24 hours) at 02/08/2023 0817 Last data filed at 02/07/2023 1523 Gross per 24 hour  Intake 1109.53 ml  Output 100 ml  Net 1009.53 ml   Filed Weights   02/06/23 2311  Weight: 58 kg    Telemetry    SR with PACs and PVCs - Personally Reviewed  ECG    No new tracings - Personally Reviewed  Physical Exam   GEN: No acute distress, elderly appearing, mittens in place.   Neck: No JVD. Cardiac: RRR, no murmurs, rubs, or gallops.  Respiratory: Coarse breath sounds bilaterally with scattered rhonchi.  GI: Soft, nontender, non-distended.   MS: No edema; No deformity. Neuro:  Alert; Nonfocal.  Psych: Calm.  Labs    Chemistry Recent Labs  Lab 02/06/23 2314 02/08/23 0510  NA 139 142  K 4.0 4.0  CL 105 110  CO2 21* 20*  GLUCOSE 177* 158*  BUN 31* 16   CREATININE 0.95 0.64  CALCIUM 8.6* 8.8*  PROT 7.3  --   ALBUMIN 3.7  --   AST 21  --   ALT 20  --   ALKPHOS 71  --   BILITOT 1.1  --   GFRNONAA 57* >60  ANIONGAP 13 12     Hematology Recent Labs  Lab 02/06/23 2314 02/08/23 0510  WBC 22.9* 20.1*  RBC 3.74* 3.49*  HGB 11.7* 10.8*  HCT 33.8* 31.4*  MCV 90.4 90.0  MCH 31.3 30.9  MCHC 34.6 34.4  RDW 15.0 15.1  PLT 354 326    Cardiac EnzymesNo results for input(s): TROPONINI in the last 168 hours. No results for input(s): TROPIPOC in the last 168 hours.   BNPNo results for input(s): BNP, PROBNP in the last 168 hours.   DDimer No results for input(s): DDIMER in the last 168 hours.   Radiology    DG Foot 2 Views Right Result Date: 02/07/2023 IMPRESSION: 1. No acute fracture or dislocation. 2. Diffuse soft tissue reticulation of the lower leg and more focal soft tissue swelling over the lateral malleolus. Electronically Signed   By: Limin  Xu M.D.   On: 02/07/2023 09:49   DG Ankle 2 Views Right  Result Date: 02/07/2023 IMPRESSION: 1. No acute fracture or dislocation. 2. Diffuse soft tissue reticulation of the lower leg and more focal soft tissue swelling over the lateral malleolus. Electronically Signed   By: Limin  Xu M.D.   On: 02/07/2023 09:49   CT Angio Chest PE W/Cm &/Or Wo Cm Result Date: 02/07/2023 IMPRESSION: 1. Negative for acute pulmonary embolism. 2. Interlobular septal thickening and patchy ground-glass opacities with bibasilar atelectasis or infiltrates. Findings favor atypical infection. 3. Geographic hypoattenuation in the cortex of the left kidney may be due to streak artifact and motion versus pyelonephritis. Correlate with urinalysis. 4. Subacute appearing left posterior 10th-11th rib fractures. 5. Extensive colonic diverticulosis without evidence of diverticulitis. Aortic Atherosclerosis (ICD10-I70.0). Electronically Signed   By: Norman Gatlin M.D.   On: 02/07/2023 00:47   CT ABDOMEN PELVIS W  CONTRAST Result Date: 02/07/2023 IMPRESSION: 1. Negative for acute pulmonary embolism. 2. Interlobular septal thickening and patchy ground-glass opacities with bibasilar atelectasis or infiltrates. Findings favor atypical infection. 3. Geographic hypoattenuation in the cortex of the left kidney may be due to streak artifact and motion versus pyelonephritis. Correlate with urinalysis. 4. Subacute appearing left posterior 10th-11th rib fractures. 5. Extensive colonic diverticulosis without evidence of diverticulitis. Aortic Atherosclerosis (ICD10-I70.0). Electronically Signed   By: Norman Gatlin M.D.   On: 02/07/2023 00:47   DG Knee 2 Views Left Result Date: 02/07/2023 IMPRESSION: Healed proximal fibular fracture.  No acute abnormality noted. Electronically Signed   By: Oneil Devonshire M.D.   On: 02/07/2023 00:31   DG Knee 2 Views Right Result Date: 02/07/2023 IMPRESSION: No acute bony abnormality. Electronically Signed   By: Franky Crease M.D.   On: 02/07/2023 00:31   DG Chest Port 1 View Result Date: 02/06/2023 IMPRESSION: Questionable patchy right upper lobe opacity/infiltrate. Borderline heart size Electronically Signed   By: Franky Crease M.D.   On: 02/06/2023 23:28    Cardiac Studies   2D echo 02/07/2023: 1. Left ventricular ejection fraction, by estimation, is >55%. The left  ventricle has normal function. Left ventricular endocardial border not  optimally defined to evaluate regional wall motion. Left ventricular  diastolic parameters are consistent with  Grade I diastolic dysfunction (impaired relaxation).   2. Right ventricular systolic function is normal. The right ventricular  size is normal.   3. Left atrial size was mildly dilated.   4. The mitral valve is normal in structure. Trivial mitral valve  regurgitation. No evidence of mitral stenosis.   5. Tricuspid valve regurgitation is moderate.   6. The aortic valve was not well visualized. Aortic valve regurgitation  is trivial.    Patient Profile     89 y.o. female with history of advanced dementia, breast cancer, recurrent falls resulting with hip and rib fractures with most recent fall in 07/2022 resulting in pelvic fracture treated nonoperatively, HTN, hypothyroidism, depression, and anxiety who is admitted with acute hypoxic respiratory failure and sepsis secondary to community-acquired pneumonia and is being seen today for the evaluation of A-fib with RVR at the request of Dr. Cleatus.   Assessment & Plan    1. Newly diagnosed A-fib with RVR: -Likely in the setting of her acute pulmonary illness -Presented in A-fib with RVR with spontaneous conversion to sinus rhythm followed by recurrent A-fib with RVR with subsequent spontaneous conversion to sinus rhythm at 2:10 AM on 1/7 and has maintained sinus rhythm since -Defer addition of beta-blocker or nondihydropyridine calcium channel blocker at this time given she is maintaining sinus rhythm, should  Afib with RVR recur, addition of beta blocker should be considered  -Reasonable to continue heparin  drip while admitted, though she is unlikely to be a long-term OAC candidate given advanced dementia with recurrent falls -Echo with preserved LVSF -Potassium at goal -TSH normal with mildly elevated free T4   2.  Acute hypoxic respiratory failure with sepsis secondary to CAP complicated by advanced dementia: -Management per primary service       For questions or updates, please contact [facility] Please consult [email or web address] for contact info under Cardiology/STEMI.    Signed, Bernardino Bring, PA-C [facility] Pager: [phone] 02/08/2023, 8:17 AM

## 2023-02-08 NOTE — Progress Notes (Addendum)
 PROGRESS NOTE    BLYSS LUGAR  FMW:[id number] DOB: 22-Aug-1931 DOA: 02/06/2023 PCP: Cleotilde Oneil FALCON, MD     Brief Narrative:   From admission h and p  Lisa Martin is a 89 y.o. female with medical history significant for advanced dementia,  HTN, dCHF, hypothyroidism, depression with anxiety, GERD, breast cancer (s/p of radiation and chemotherapy), protein calorie malnutrition, prior falls with resulting hip fracture, rib fractures, with most recent fall 07/2022 with pelvic fracture treated nonoperatively, who presents from memory care unit with concerns for tachycardia and tachypnea with hypoxia to 89% requiring 6 L by EMS to maintain sats in the mid 90s.  She was also tachycardic to 170.  Patient unable to contribute to history due to dementia.    Assessment & Plan:   Principal Problem:   Severe sepsis due to pneumonia [facility]) Active Problems:   Acute respiratory failure with hypoxia (HCC)   Atrial fibrillation with rapid ventricular response, new onset (HCC)   AKI (acute kidney injury) (HCC)   Hypertension   Acquired hypothyroidism   Dementia (HCC)   Depression with anxiety   Personal history of malignant neoplasm of breast   Rhinovirus infection  # CAP # Rhinovirus Presenting with cough and dyspnea and hypoxia, CTA no PE but findings consistent with atypical infection. Covid/flu/rsv neg, rvp positive for rhinovirus. Daughter reports two weeks of symptoms that were initially mild cold-like symptoms. Given duration and severity of illness, prudent to continue to cover for superimposed CAP. Mrsa swab negative - continue ceftriaxone /azithromycin  - continue methylpred (no copd history but extensive prior smoking history) - will also trial some ipratropium breathing treatments. Holding on albuterol  given a-fib  # Sepsis 2/2 pneumonia, by tachypnea and leukocytosis. Hemodynamically stable - continue gentle fluids and abx  # Acute hypoxic respiratory failure 2/2 pneumonia. With ongoing  tachypnea and increased wob. No co2 retention on most recent VBG - continue Jewell o2 - trial of bipap  # A-fib with rvr Appears to be a new dx. Hr improved - continue heparin  - TTE pending - cardiology following, adding dilt today  # Right foot pain No erythema. X-rays no fracture or other acute process  # Hypothyroid Tsh wnl - cont home levothyroxine   # Advanced alzheimer's dementia - cont home memantine , seroquel , rivastigmine   # MDD - home sertraline    DVT prophylaxis: therapeutic heparin  Code Status: dnr/dni Family Communication: daughter linda updated telephonically 1/8  Level of care: Progressive Status is: Inpatient Remains inpatient appropriate because: severity of illness    Consultants:  none  Procedures: none  Antimicrobials:  See above    Subjective: No complaints  Objective: Vitals:   02/08/23 0400 02/08/23 0412 02/08/23 0630 02/08/23 0700  BP: (!) 162/88  (!) 165/83 (!) 153/86  Pulse: 91  (!) 56 92  Resp: (!) 33  (!) 30 (!) 29  Temp:  97.6 F (36.4 C)    TempSrc:  Axillary    SpO2: 95%  97% 96%  Weight:      Height:        Intake/Output Summary (Last 24 hours) at 02/08/2023 0855 Last data filed at 02/07/2023 1523 Gross per 24 hour  Intake 1109.53 ml  Output --  Net 1109.53 ml   Filed Weights   02/06/23 2311  Weight: 58 kg    Examination:  General exam: Appears calm   Respiratory system: tachypneic, rhonchi throughout Cardiovascular system: S1 & S2 heard, mild tachycardia Gastrointestinal system: Abdomen is soft, non-tender  Central nervous system: Alert,  oriented to self, non-focal Extremities: Symmetric 5 x 5 power.   Skin: No visible rashes, lesions or ulcers Psychiatry: calm, confused    Data Reviewed: I have personally reviewed following labs and imaging studies  CBC: Recent Labs  Lab 02/06/23 2314 02/08/23 0510  WBC 22.9* 20.1*  NEUTROABS 20.7*  --   HGB 11.7* 10.8*  HCT 33.8* 31.4*  MCV 90.4 90.0  PLT 354  326   Basic Metabolic Panel: Recent Labs  Lab 02/06/23 2314 02/08/23 0510  NA 139 142  K 4.0 4.0  CL 105 110  CO2 21* 20*  GLUCOSE 177* 158*  BUN 31* 16  CREATININE 0.95 0.64  CALCIUM 8.6* 8.8*   GFR: Estimated Creatinine Clearance: 37.7 mL/min (by C-G formula based on SCr of 0.64 mg/dL). Liver Function Tests: Recent Labs  Lab 02/06/23 2314  AST 21  ALT 20  ALKPHOS 71  BILITOT 1.1  PROT 7.3  ALBUMIN 3.7   No results for input(s): LIPASE, AMYLASE in the last 168 hours. No results for input(s): AMMONIA in the last 168 hours. Coagulation Profile: Recent Labs  Lab 02/06/23 2314  INR 1.3*   Cardiac Enzymes: No results for input(s): CKTOTAL, CKMB, CKMBINDEX, TROPONINI in the last 168 hours. BNP (last 3 results) No results for input(s): PROBNP in the last 8760 hours. HbA1C: No results for input(s): HGBA1C in the last 72 hours. CBG: No results for input(s): GLUCAP in the last 168 hours. Lipid Profile: No results for input(s): CHOL, HDL, LDLCALC, TRIG, CHOLHDL, LDLDIRECT in the last 72 hours. Thyroid  Function Tests: Recent Labs    02/06/23 2314  TSH 3.679  FREET4 1.34*   Anemia Panel: No results for input(s): VITAMINB12, FOLATE, FERRITIN, TIBC, IRON, RETICCTPCT in the last 72 hours. Urine analysis:    Component Value Date/Time   COLORURINE YELLOW (A) 02/07/2023 0654   APPEARANCEUR CLEAR (A) 02/07/2023 0654   LABSPEC >1.046 (H) 02/07/2023 0654   PHURINE 5.0 02/07/2023 0654   GLUCOSEU NEGATIVE 02/07/2023 0654   HGBUR NEGATIVE 02/07/2023 0654   BILIRUBINUR NEGATIVE 02/07/2023 0654   KETONESUR NEGATIVE 02/07/2023 0654   PROTEINUR NEGATIVE 02/07/2023 0654   NITRITE NEGATIVE 02/07/2023 0654   LEUKOCYTESUR NEGATIVE 02/07/2023 0654   Sepsis Labs: @LABRCNTIP (procalcitonin:4,lacticidven:4)  ) Recent Results (from the past 240 hours)  Blood Culture (routine x 2)     Status: None (Preliminary result)   Collection  Time: 02/06/23 11:14 PM   Specimen: BLOOD  Result Value Ref Range Status   Specimen Description BLOOD BLOOD RIGHT WRIST  Final   Special Requests   Final    BOTTLES DRAWN AEROBIC AND ANAEROBIC Blood Culture adequate volume   Culture   Final    NO GROWTH 2 DAYS Performed at [facility], [address], [place], [place] [address]    Report Status PENDING  Incomplete  Blood Culture (routine x 2)     Status: None (Preliminary result)   Collection Time: 02/06/23 11:14 PM   Specimen: BLOOD  Result Value Ref Range Status   Specimen Description BLOOD BLOOD LEFT FOREARM  Final   Special Requests   Final    BOTTLES DRAWN AEROBIC AND ANAEROBIC Blood Culture adequate volume   Culture   Final    NO GROWTH 2 DAYS Performed at [facility], [address] [place], [place] [address]    Report Status PENDING  Incomplete  Resp panel by RT-PCR (RSV, Flu A&B, Covid) Anterior Nasal Swab     Status: None   Collection  Time: 02/06/23 11:35 PM   Specimen: Anterior Nasal Swab  Result Value Ref Range Status   SARS Coronavirus 2 by RT PCR NEGATIVE NEGATIVE Final    Comment: (NOTE) SARS-CoV-2 target nucleic acids are NOT DETECTED.  The SARS-CoV-2 RNA is generally detectable in upper respiratory specimens during the acute phase of infection. The lowest concentration of SARS-CoV-2 viral copies this assay can detect is 138 copies/mL. A negative result does not preclude SARS-Cov-2 infection and should not be used as the sole basis for treatment or other patient management decisions. A negative result may occur with  improper specimen collection/handling, submission of specimen other than nasopharyngeal swab, presence of viral mutation(s) within the areas targeted by this assay, and inadequate number of viral copies(<138 copies/mL). A negative result must be combined with clinical observations, patient history, and epidemiological information. The expected result is Negative.  Fact  Sheet for Patients:  bloggercourse.com  Fact Sheet for Healthcare Providers:  seriousbroker.it  This test is no t yet approved or cleared by the [place]  FDA and  has been authorized for detection and/or diagnosis of SARS-CoV-2 by FDA under an Emergency Use Authorization (EUA). This EUA will remain  in effect (meaning this test can be used) for the duration of the COVID-19 declaration under Section 564(b)(1) of the Act, 21 U.S.C.section 360bbb-3(b)(1), unless the authorization is terminated  or revoked sooner.       Influenza A by PCR NEGATIVE NEGATIVE Final   Influenza B by PCR NEGATIVE NEGATIVE Final    Comment: (NOTE) The Xpert Xpress SARS-CoV-2/FLU/RSV plus assay is intended as an aid in the diagnosis of influenza from Nasopharyngeal swab specimens and should not be used as a sole basis for treatment. Nasal washings and aspirates are unacceptable for Xpert Xpress SARS-CoV-2/FLU/RSV testing.  Fact Sheet for Patients: bloggercourse.com  Fact Sheet for Healthcare Providers: seriousbroker.it  This test is not yet approved or cleared by the [place]  FDA and has been authorized for detection and/or diagnosis of SARS-CoV-2 by FDA under an Emergency Use Authorization (EUA). This EUA will remain in effect (meaning this test can be used) for the duration of the COVID-19 declaration under Section 564(b)(1) of the Act, 21 U.S.C. section 360bbb-3(b)(1), unless the authorization is terminated or revoked.     Resp Syncytial Virus by PCR NEGATIVE NEGATIVE Final    Comment: (NOTE) Fact Sheet for Patients: bloggercourse.com  Fact Sheet for Healthcare Providers: seriousbroker.it  This test is not yet approved or cleared by the [place]  FDA and has been authorized for detection and/or diagnosis of SARS-CoV-2 by FDA under an  Emergency Use Authorization (EUA). This EUA will remain in effect (meaning this test can be used) for the duration of the COVID-19 declaration under Section 564(b)(1) of the Act, 21 U.S.C. section 360bbb-3(b)(1), unless the authorization is terminated or revoked.  Performed at [facility], [address] [place], [place] [address]   Respiratory (~20 pathogens) panel by PCR     Status: Abnormal   Collection Time: 02/07/23  9:21 AM   Specimen: Nasopharyngeal Swab; Respiratory  Result Value Ref Range Status   Adenovirus NOT DETECTED NOT DETECTED Final   Coronavirus 229E NOT DETECTED NOT DETECTED Final    Comment: (NOTE) The Coronavirus on the Respiratory Panel, DOES NOT test for the novel  Coronavirus (2019 nCoV)    Coronavirus HKU1 NOT DETECTED NOT DETECTED Final   Coronavirus NL63 NOT DETECTED NOT DETECTED Final   Coronavirus OC43 NOT DETECTED NOT DETECTED Final   Metapneumovirus  NOT DETECTED NOT DETECTED Final   Rhinovirus / Enterovirus DETECTED (A) NOT DETECTED Final   Influenza A NOT DETECTED NOT DETECTED Final   Influenza B NOT DETECTED NOT DETECTED Final   Parainfluenza Virus 1 NOT DETECTED NOT DETECTED Final   Parainfluenza Virus 2 NOT DETECTED NOT DETECTED Final   Parainfluenza Virus 3 NOT DETECTED NOT DETECTED Final   Parainfluenza Virus 4 NOT DETECTED NOT DETECTED Final   Respiratory Syncytial Virus NOT DETECTED NOT DETECTED Final   Bordetella pertussis NOT DETECTED NOT DETECTED Final   Bordetella Parapertussis NOT DETECTED NOT DETECTED Final   Chlamydophila pneumoniae NOT DETECTED NOT DETECTED Final   Mycoplasma pneumoniae NOT DETECTED NOT DETECTED Final    Comment: Performed at [facility], [address] [place], [place] [address]  MRSA Next Gen by PCR, Nasal     Status: None   Collection Time: 02/07/23 10:14 AM   Specimen: Nasal Mucosa; Nasal Swab  Result Value Ref Range Status   MRSA by PCR Next Gen NOT DETECTED NOT DETECTED Final    Comment:  (NOTE) The GeneXpert MRSA Assay (FDA approved for NASAL specimens only), is one component of a comprehensive MRSA colonization surveillance program. It is not intended to diagnose MRSA infection nor to guide or monitor treatment for MRSA infections. Test performance is not FDA approved in patients less than 72 years old. Performed at [facility], [address], [place], [place] [address]          Radiology Studies: ECHOCARDIOGRAM COMPLETE Result Date: 02/07/2023    ECHOCARDIOGRAM REPORT   Patient Name:   Lisa Martin Date of Exam: 02/07/2023 Medical Rec #:  [id number]   Height:       62.0 in Accession #:    [id number]  Weight:       127.9 lb Date of Birth:  [birth year]   BSA:          1.581 m Patient Age:    89 years    BP:           125/67 mmHg Patient Gender: F           HR:           77 bpm. Exam Location:  [facility] Procedure: 2D Echo, Cardiac Doppler and Color Doppler Indications:     Atrial Fibrillation  History:         Patient has no prior history of Echocardiogram examinations.                  CHF, Arrythmias:Atrial Fibrillation, Signs/Symptoms:Altered                  Mental Status; Risk Factors:Hypertension.  Sonographer:     Naomie Reef Referring Phys:  [id number] DELAYNE LULLA SOLIAN Diagnosing Phys: Lonni Hanson MD  Sonographer Comments: Technically difficult study due to poor echo windows. Image acquisition challenging due to patient behavioral factors. IMPRESSIONS  1. Left ventricular ejection fraction, by estimation, is >55%. The left ventricle has normal function. Left ventricular endocardial border not optimally defined to evaluate regional wall motion. Left ventricular diastolic parameters are consistent with Grade I diastolic dysfunction (impaired relaxation).  2. Right ventricular systolic function is normal. The right ventricular size is normal.  3. Left atrial size was mildly dilated.  4. The mitral valve is normal in structure. Trivial mitral valve regurgitation. No  evidence of mitral stenosis.  5. Tricuspid valve regurgitation is moderate.  6. The aortic valve was not well visualized.  Aortic valve regurgitation is trivial. FINDINGS  Left Ventricle: Left ventricular ejection fraction, by estimation, is >55%. The left ventricle has normal function. Left ventricular endocardial border not optimally defined to evaluate regional wall motion. The left ventricular internal cavity size was  normal in size. There is borderline left ventricular hypertrophy. Left ventricular diastolic parameters are consistent with Grade I diastolic dysfunction (impaired relaxation). Right Ventricle: The right ventricular size is normal. No increase in right ventricular wall thickness. Right ventricular systolic function is normal. Left Atrium: Left atrial size was mildly dilated. Right Atrium: Right atrial size was normal in size. Pericardium: The pericardium was not well visualized. Mitral Valve: The mitral valve is normal in structure. Trivial mitral valve regurgitation. No evidence of mitral valve stenosis. MV peak gradient, 5.1 mmHg. The mean mitral valve gradient is 2.0 mmHg. Tricuspid Valve: The tricuspid valve is not well visualized. Tricuspid valve regurgitation is moderate. Aortic Valve: The aortic valve was not well visualized. Aortic valve regurgitation is trivial. Aortic valve mean gradient measures 2.0 mmHg. Aortic valve peak gradient measures 5.5 mmHg. Aortic valve area, by VTI measures 2.27 cm. Pulmonic Valve: The pulmonic valve was not well visualized. Pulmonic valve regurgitation is trivial. No evidence of pulmonic stenosis. Aorta: The aortic root is normal in size and structure. Pulmonary Artery: The pulmonary artery is not well seen. Venous: The inferior vena cava was not well visualized. IAS/Shunts: The interatrial septum was not well visualized.  LEFT VENTRICLE PLAX 2D LVIDd:         4.20 cm   Diastology LVIDs:         2.50 cm   LV e' medial:    7.72 cm/s LV PW:         0.90 cm    LV E/e' medial:  11.8 LV IVS:        1.10 cm   LV e' lateral:   8.16 cm/s LVOT diam:     2.00 cm   LV E/e' lateral: 11.2 LV SV:         57 LV SV Index:   36 LVOT Area:     3.14 cm  LEFT ATRIUM           Index        RIGHT ATRIUM           Index LA diam:      2.90 cm 1.83 cm/m   RA Area:     12.84 cm LA Vol (A2C): 37.6 ml 23.79 ml/m  RA Volume:   29.17 ml  18.45 ml/m LA Vol (A4C): 65.7 ml 41.55 ml/m  AORTIC VALVE                    PULMONIC VALVE AV Area (Vmax):    2.79 cm     PV Vmax:       0.90 m/s AV Area (Vmean):   2.77 cm     PV Peak grad:  3.2 mmHg AV Area (VTI):     2.27 cm AV Vmax:           117.00 cm/s AV Vmean:          70.200 cm/s AV VTI:            0.251 m AV Peak Grad:      5.5 mmHg AV Mean Grad:      2.0 mmHg LVOT Vmax:         104.00 cm/s LVOT Vmean:  62.000 cm/s LVOT VTI:          0.181 m LVOT/AV VTI ratio: 0.72  AORTA Ao Root diam: 2.90 cm MITRAL VALVE               TRICUSPID VALVE MV Area (PHT): 4.24 cm    TR Peak grad:   33.4 mmHg MV Area VTI:   1.60 cm    TR Vmax:        289.00 cm/s MV Peak grad:  5.1 mmHg MV Mean grad:  2.0 mmHg    SHUNTS MV Vmax:       1.13 m/s    Systemic VTI:  0.18 m MV Vmean:      66.2 cm/s   Systemic Diam: 2.00 cm MV Decel Time: 179 msec MV E velocity: 91.20 cm/s MV A velocity: 93.30 cm/s MV E/A ratio:  0.98 Lonni End MD Electronically signed by Lonni Hanson MD Signature Date/Time: 02/07/2023/2:33:21 PM    Final    DG Foot 2 Views Right Result Date: 02/07/2023 CLINICAL DATA:  Right foot and ankle pain EXAM: RIGHT FOOT - 2 VIEW; RIGHT ANKLE - 2 VIEW COMPARISON:  None Available. FINDINGS: There is no evidence of fracture or dislocation. Diffuse osteopenia. Diffuse soft tissue reticulation of the lower leg and more focal soft tissue swelling over the lateral malleolus. Vascular calcifications. IMPRESSION: 1. No acute fracture or dislocation. 2. Diffuse soft tissue reticulation of the lower leg and more focal soft tissue swelling over the lateral  malleolus. Electronically Signed   By: Limin  Xu M.D.   On: 02/07/2023 09:49   DG Ankle 2 Views Right Result Date: 02/07/2023 CLINICAL DATA:  Right foot and ankle pain EXAM: RIGHT FOOT - 2 VIEW; RIGHT ANKLE - 2 VIEW COMPARISON:  None Available. FINDINGS: There is no evidence of fracture or dislocation. Diffuse osteopenia. Diffuse soft tissue reticulation of the lower leg and more focal soft tissue swelling over the lateral malleolus. Vascular calcifications. IMPRESSION: 1. No acute fracture or dislocation. 2. Diffuse soft tissue reticulation of the lower leg and more focal soft tissue swelling over the lateral malleolus. Electronically Signed   By: Limin  Xu M.D.   On: 02/07/2023 09:49   CT Angio Chest PE W/Cm &/Or Wo Cm Result Date: 02/07/2023 CLINICAL DATA:  Abdominal pain, bowel obstruction suspected, sepsis, tachypneic and tachycardic. Hypoxemia requiring oxygen. Bilateral knee pain and abdominal distention and pain. PE suspected. EXAM: CT ANGIOGRAPHY CHEST CT ABDOMEN AND PELVIS WITH CONTRAST TECHNIQUE: Multidetector CT imaging of the chest was performed using the standard protocol during bolus administration of intravenous contrast. Multiplanar CT image reconstructions and MIPs were obtained to evaluate the vascular anatomy. Multidetector CT imaging of the abdomen and pelvis was performed using the standard protocol during bolus administration of intravenous contrast. RADIATION DOSE REDUCTION: This exam was performed according to the departmental dose-optimization program which includes automated exposure control, adjustment of the mA and/or kV according to patient size and/or use of iterative reconstruction technique. CONTRAST:  75mL OMNIPAQUE  IOHEXOL  350 MG/ML SOLN COMPARISON:  CT pelvis 07/14/2022 and CT chest 07/15/2016 FINDINGS: CTA CHEST FINDINGS Cardiovascular: Negative for acute pulmonary embolism. No pericardial effusion. Coronary artery and aortic atherosclerotic calcification. Mediastinum/Nodes:  Esophagus is unremarkable. Nodules along the posterior trachea likely represent adherent debris. No lymphadenopathy. Lungs/Pleura: Respiratory motion obscures fine detail. Interlobular septal thickening and patchy ground-glass opacities. Bronchial wall thickening. Bibasilar atelectasis or infiltrates. No pleural effusion or pneumothorax. Musculoskeletal: Chronic left scapular and rib fractures. Subacute appearing left posterior 10th-11th rib  fractures. Chronic appearing compression fractures of T7 and T8. T7 kyphoplasty. Postoperative changes at the thoracolumbar junction. Review of the MIP images confirms the above findings. CT ABDOMEN and PELVIS FINDINGS Hepatobiliary: Respiratory motion obscures detail in the abdomen. No acute hepatobiliary abnormality. Pancreas: Unremarkable. Spleen: Unremarkable. Adrenals/Urinary Tract: Unremarkable adrenal glands. Bilateral small cystic lesions statistically likely to represent benign cysts. No follow-up recommended. Geographic hypoattenuation in the cortex of the left kidney may be due to artifact from streak and motion versus pyelonephritis. No hydronephrosis. Unremarkable bladder. Stomach/Bowel: Normal caliber large and small bowel. Extensive colonic diverticulosis without evidence of diverticulitis. Stomach is within normal limits. Vascular/Lymphatic: IVC filter. Aortic atherosclerosis. No enlarged abdominal or pelvic lymph nodes. Reproductive: No acute abnormality. Other: No free intraperitoneal fluid or air. Musculoskeletal: IM rod and screw fixation right femur. Left THA. Chronic right pubic rami fractures. No acute fracture. Review of the MIP images confirms the above findings. IMPRESSION: 1. Negative for acute pulmonary embolism. 2. Interlobular septal thickening and patchy ground-glass opacities with bibasilar atelectasis or infiltrates. Findings favor atypical infection. 3. Geographic hypoattenuation in the cortex of the left kidney may be due to streak artifact and  motion versus pyelonephritis. Correlate with urinalysis. 4. Subacute appearing left posterior 10th-11th rib fractures. 5. Extensive colonic diverticulosis without evidence of diverticulitis. Aortic Atherosclerosis (ICD10-I70.0). Electronically Signed   By: Norman Gatlin M.D.   On: 02/07/2023 00:47   CT ABDOMEN PELVIS W CONTRAST Result Date: 02/07/2023 CLINICAL DATA:  Abdominal pain, bowel obstruction suspected, sepsis, tachypneic and tachycardic. Hypoxemia requiring oxygen. Bilateral knee pain and abdominal distention and pain. PE suspected. EXAM: CT ANGIOGRAPHY CHEST CT ABDOMEN AND PELVIS WITH CONTRAST TECHNIQUE: Multidetector CT imaging of the chest was performed using the standard protocol during bolus administration of intravenous contrast. Multiplanar CT image reconstructions and MIPs were obtained to evaluate the vascular anatomy. Multidetector CT imaging of the abdomen and pelvis was performed using the standard protocol during bolus administration of intravenous contrast. RADIATION DOSE REDUCTION: This exam was performed according to the departmental dose-optimization program which includes automated exposure control, adjustment of the mA and/or kV according to patient size and/or use of iterative reconstruction technique. CONTRAST:  75mL OMNIPAQUE  IOHEXOL  350 MG/ML SOLN COMPARISON:  CT pelvis 07/14/2022 and CT chest 07/15/2016 FINDINGS: CTA CHEST FINDINGS Cardiovascular: Negative for acute pulmonary embolism. No pericardial effusion. Coronary artery and aortic atherosclerotic calcification. Mediastinum/Nodes: Esophagus is unremarkable. Nodules along the posterior trachea likely represent adherent debris. No lymphadenopathy. Lungs/Pleura: Respiratory motion obscures fine detail. Interlobular septal thickening and patchy ground-glass opacities. Bronchial wall thickening. Bibasilar atelectasis or infiltrates. No pleural effusion or pneumothorax. Musculoskeletal: Chronic left scapular and rib fractures.  Subacute appearing left posterior 10th-11th rib fractures. Chronic appearing compression fractures of T7 and T8. T7 kyphoplasty. Postoperative changes at the thoracolumbar junction. Review of the MIP images confirms the above findings. CT ABDOMEN and PELVIS FINDINGS Hepatobiliary: Respiratory motion obscures detail in the abdomen. No acute hepatobiliary abnormality. Pancreas: Unremarkable. Spleen: Unremarkable. Adrenals/Urinary Tract: Unremarkable adrenal glands. Bilateral small cystic lesions statistically likely to represent benign cysts. No follow-up recommended. Geographic hypoattenuation in the cortex of the left kidney may be due to artifact from streak and motion versus pyelonephritis. No hydronephrosis. Unremarkable bladder. Stomach/Bowel: Normal caliber large and small bowel. Extensive colonic diverticulosis without evidence of diverticulitis. Stomach is within normal limits. Vascular/Lymphatic: IVC filter. Aortic atherosclerosis. No enlarged abdominal or pelvic lymph nodes. Reproductive: No acute abnormality. Other: No free intraperitoneal fluid or air. Musculoskeletal: IM rod and screw fixation right femur. Left  THA. Chronic right pubic rami fractures. No acute fracture. Review of the MIP images confirms the above findings. IMPRESSION: 1. Negative for acute pulmonary embolism. 2. Interlobular septal thickening and patchy ground-glass opacities with bibasilar atelectasis or infiltrates. Findings favor atypical infection. 3. Geographic hypoattenuation in the cortex of the left kidney may be due to streak artifact and motion versus pyelonephritis. Correlate with urinalysis. 4. Subacute appearing left posterior 10th-11th rib fractures. 5. Extensive colonic diverticulosis without evidence of diverticulitis. Aortic Atherosclerosis (ICD10-I70.0). Electronically Signed   By: Norman Gatlin M.D.   On: 02/07/2023 00:47   DG Knee 2 Views Left Result Date: 02/07/2023 CLINICAL DATA:  Recent fall with knee pain,  initial encounter EXAM: LEFT KNEE - 2 VIEW COMPARISON:  None Available. FINDINGS: Healed proximal fibular fracture is noted. No acute fracture or dislocation is noted. No soft tissue abnormality is seen. No joint effusion is noted. IMPRESSION: Healed proximal fibular fracture.  No acute abnormality noted. Electronically Signed   By: Oneil Devonshire M.D.   On: 02/07/2023 00:31   DG Knee 2 Views Right Result Date: 02/07/2023 CLINICAL DATA:  Fall, pain EXAM: RIGHT KNEE - 1-2 VIEW COMPARISON:  None Available. FINDINGS: Chondrocalcinosis and mild degenerative changes throughout the right knee. No acute bony abnormality. Specifically, no fracture, subluxation, or dislocation. No joint effusion. Vascular calcifications noted. IMPRESSION: No acute bony abnormality. Electronically Signed   By: Franky Crease M.D.   On: 02/07/2023 00:31   DG Chest Port 1 View Result Date: 02/06/2023 CLINICAL DATA:  Questionable sepsis - evaluate for abnormality. Low O2 sats. EXAM: PORTABLE CHEST 1 VIEW COMPARISON:  07/14/2022 FINDINGS: Heart is borderline in size. Mediastinal contours within normal limits. Scattered aortic atherosclerosis. Questionable patchy opacity in the right upper lobe. No confluent opacity on the left. No effusions. No acute bony abnormality. Old healed left rib fractures. IMPRESSION: Questionable patchy right upper lobe opacity/infiltrate. Borderline heart size Electronically Signed   By: Franky Crease M.D.   On: 02/06/2023 23:28        Scheduled Meds:  diltiazem   120 mg Oral Daily   levothyroxine   125 mcg Oral Q0600   memantine   5 mg Oral BID   methylPREDNISolone  (SOLU-MEDROL ) injection  40 mg Intravenous Q12H   QUEtiapine   12.5 mg Oral QHS   rivastigmine   3 mg Oral BID   sertraline   25 mg Oral Daily   Continuous Infusions:  sodium chloride  150 mL/hr at 02/07/23 1309   azithromycin  500 mg (02/08/23 9366)   cefTRIAXone  (ROCEPHIN )  IV Stopped (02/07/23 1200)   heparin  800 Units/hr (02/08/23 0405)      LOS: 1 day   CRITICAL CARE Performed by: Devaughn KATHEE Ban   Total critical care time: 48 minutes  Critical care time was exclusive of separately billable procedures and treating other patients.  Critical care was necessary to treat or prevent imminent or life-threatening deterioration.  Critical care was time spent personally by me on the following activities: development of treatment plan with patient and/or surrogate as well as nursing, discussions with consultants, evaluation of patient's response to treatment, examination of patient, obtaining history from patient or surrogate, ordering and performing treatments and interventions, ordering and review of laboratory studies, ordering and review of radiographic studies, pulse oximetry and re-evaluation of patient's condition.    Devaughn KATHEE Ban, MD [facility]   If 7PM-7AM, please contact night-coverage [email or web address] Password [facility] 02/08/2023, 8:55 AM

## 2023-02-08 NOTE — ED Notes (Signed)
 Pt readjusted in bed, no complaints at this time.

## 2023-02-08 NOTE — ED Notes (Signed)
 Pt offered lunch, pt refused.

## 2023-02-08 NOTE — ED Notes (Signed)
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   8.16 cm/s LVOT diam:     2.00 cm   LV E/e' lateral: 11.2 LV SV:         57 LV SV Index:   36 LVOT Area:     3.14 cm  LEFT ATRIUM           Index        RIGHT ATRIUM           Index LA diam:      2.90 cm 1.83 cm/m    RA Area:  12.84 cm LA Vol (A2C): 37.6 ml 23.79 ml/m  RA Volume:   29.17 ml  18.45 ml/m LA Vol (A4C): 65.7 ml 41.55 ml/m  AORTIC VALVE                    PULMONIC VALVE AV Area (Vmax):    2.79 cm     PV Vmax:       0.90 m/s AV Area (Vmean):   2.77 cm     PV Peak grad:  3.2 mmHg AV Area (VTI):     2.27 cm AV Vmax:           117.00 cm/s AV Vmean:          70.200 cm/s AV VTI:            0.251 m AV Peak Grad:      5.5 mmHg AV Mean Grad:      2.0 mmHg LVOT Vmax:         104.00 cm/s LVOT Vmean:        62.000 cm/s LVOT VTI:          0.181 m LVOT/AV VTI ratio: 0.72  AORTA Ao Root diam: 2.90 cm MITRAL VALVE               TRICUSPID VALVE MV Area (PHT): 4.24 cm    TR Peak grad:   33.4 mmHg MV Area VTI:   1.60 cm    TR Vmax:        289.00 cm/s MV Peak grad:  5.1 mmHg MV Mean grad:  2.0 mmHg    SHUNTS MV Vmax:       1.13 m/s    Systemic VTI:  0.18 m MV Vmean:      66.2 cm/s   Systemic Diam: 2.00 cm MV Decel Time: 179 msec MV E velocity: 91.20 cm/s MV A velocity: 93.30 cm/s MV E/A ratio:  0.98 Lonni End MD Electronically signed by Lonni Hanson MD Signature Date/Time: 02/07/2023/2:33:21 PM    Final    DG Foot 2 Views Right Result Date: 02/07/2023 CLINICAL DATA:  Right foot and ankle pain EXAM: RIGHT FOOT - 2 VIEW; RIGHT ANKLE - 2 VIEW COMPARISON:  None Available. FINDINGS: There is no evidence of fracture or dislocation. Diffuse osteopenia. Diffuse soft tissue reticulation of the lower leg and more focal soft tissue swelling over the lateral malleolus. Vascular calcifications. IMPRESSION: 1. No acute fracture or dislocation. 2. Diffuse soft tissue reticulation of the lower leg and more focal soft tissue swelling over the lateral malleolus. Electronically Signed   By: Limin  Xu M.D.   On: 02/07/2023 09:49   DG Ankle 2 Views Right Result Date: 02/07/2023 CLINICAL DATA:  Right foot and ankle pain EXAM: RIGHT FOOT - 2 VIEW; RIGHT ANKLE - 2 VIEW COMPARISON:  None Available. FINDINGS: There is no evidence of  fracture or dislocation. Diffuse osteopenia. Diffuse soft tissue reticulation of the lower leg and more focal soft tissue swelling over the lateral malleolus. Vascular calcifications. IMPRESSION: 1. No acute fracture or dislocation. 2. Diffuse soft tissue reticulation of the lower leg and more focal soft tissue swelling over the lateral malleolus. Electronically Signed   By: Limin  Xu M.D.   On: 02/07/2023 09:49   CT Angio Chest PE W/Cm &/Or Wo Cm Result Date: 02/07/2023 CLINICAL DATA:  Abdominal pain, bowel obstruction suspected, sepsis, tachypneic and tachycardic. Hypoxemia requiring oxygen. Bilateral knee pain and abdominal distention and pain. PE suspected. EXAM: CT ANGIOGRAPHY CHEST CT  ABDOMEN AND PELVIS WITH CONTRAST TECHNIQUE: Multidetector CT imaging of the chest was performed using the standard protocol during bolus administration of intravenous contrast. Multiplanar CT image reconstructions and MIPs were obtained to evaluate the vascular anatomy. Multidetector CT imaging of the abdomen and pelvis was performed using the standard protocol during bolus administration of intravenous contrast. RADIATION DOSE REDUCTION: This exam was performed according to the departmental dose-optimization program which includes automated exposure control, adjustment of the mA and/or kV according to patient size and/or use of iterative reconstruction technique. CONTRAST:  75mL OMNIPAQUE  IOHEXOL  350 MG/ML SOLN COMPARISON:  CT pelvis 07/14/2022 and CT chest 07/15/2016 FINDINGS: CTA CHEST FINDINGS Cardiovascular: Negative for acute pulmonary embolism. No pericardial effusion. Coronary artery and aortic atherosclerotic calcification. Mediastinum/Nodes: Esophagus is unremarkable. Nodules along the posterior trachea likely represent adherent debris. No lymphadenopathy. Lungs/Pleura: Respiratory motion obscures fine detail. Interlobular septal thickening and patchy ground-glass opacities. Bronchial wall thickening. Bibasilar  atelectasis or infiltrates. No pleural effusion or pneumothorax. Musculoskeletal: Chronic left scapular and rib fractures. Subacute appearing left posterior 10th-11th rib fractures. Chronic appearing compression fractures of T7 and T8. T7 kyphoplasty. Postoperative changes at the thoracolumbar junction. Review of the MIP images confirms the above findings. CT ABDOMEN and PELVIS FINDINGS Hepatobiliary: Respiratory motion obscures detail in the abdomen. No acute hepatobiliary abnormality. Pancreas: Unremarkable. Spleen: Unremarkable. Adrenals/Urinary Tract: Unremarkable adrenal glands. Bilateral small cystic lesions statistically likely to represent benign cysts. No follow-up recommended. Geographic hypoattenuation in the cortex of the left kidney may be due to artifact from streak and motion versus pyelonephritis. No hydronephrosis. Unremarkable bladder. Stomach/Bowel: Normal caliber large and small bowel. Extensive colonic diverticulosis without evidence of diverticulitis. Stomach is within normal limits. Vascular/Lymphatic: IVC filter. Aortic atherosclerosis. No enlarged abdominal or pelvic lymph nodes. Reproductive: No acute abnormality. Other: No free intraperitoneal fluid or air. Musculoskeletal: IM rod and screw fixation right femur. Left THA. Chronic right pubic rami fractures. No acute fracture. Review of the MIP images confirms the above findings. IMPRESSION: 1. Negative for acute pulmonary embolism. 2. Interlobular septal thickening and patchy ground-glass opacities with bibasilar atelectasis or infiltrates. Findings favor atypical infection. 3. Geographic hypoattenuation in the cortex of the left kidney may be due to streak artifact and motion versus pyelonephritis. Correlate with urinalysis. 4. Subacute appearing left posterior 10th-11th rib fractures. 5. Extensive colonic diverticulosis without evidence of diverticulitis. Aortic Atherosclerosis (ICD10-I70.0). Electronically Signed   By: Norman Gatlin  M.D.   On: 02/07/2023 00:47   CT ABDOMEN PELVIS W CONTRAST Result Date: 02/07/2023 CLINICAL DATA:  Abdominal pain, bowel obstruction suspected, sepsis, tachypneic and tachycardic. Hypoxemia requiring oxygen. Bilateral knee pain and abdominal distention and pain. PE suspected. EXAM: CT ANGIOGRAPHY CHEST CT ABDOMEN AND PELVIS WITH CONTRAST TECHNIQUE: Multidetector CT imaging of the chest was performed using the standard protocol during bolus administration of intravenous contrast. Multiplanar CT image reconstructions and MIPs were obtained to evaluate the vascular anatomy. Multidetector CT imaging of the abdomen and pelvis was performed using the standard protocol during bolus administration of intravenous contrast. RADIATION DOSE REDUCTION: This exam was performed according to the departmental dose-optimization program which includes automated exposure control, adjustment of the mA and/or kV according to patient size and/or use of iterative reconstruction technique. CONTRAST:  75mL OMNIPAQUE  IOHEXOL  350 MG/ML SOLN COMPARISON:  CT pelvis 07/14/2022 and CT chest 07/15/2016 FINDINGS: CTA CHEST FINDINGS Cardiovascular: Negative for acute pulmonary embolism. No pericardial effusion. Coronary artery and aortic atherosclerotic calcification. Mediastinum/Nodes: Esophagus is unremarkable. Nodules along the posterior trachea likely represent adherent debris.  No lymphadenopathy. Lungs/Pleura: Respiratory motion obscures fine detail. Interlobular septal thickening and patchy ground-glass opacities. Bronchial wall thickening. Bibasilar atelectasis or infiltrates. No pleural effusion or pneumothorax. Musculoskeletal: Chronic left scapular and rib fractures. Subacute appearing left posterior 10th-11th rib fractures. Chronic appearing compression fractures of T7 and T8. T7 kyphoplasty. Postoperative changes at the thoracolumbar junction. Review of the MIP images confirms the above findings. CT ABDOMEN and PELVIS FINDINGS  Hepatobiliary: Respiratory motion obscures detail in the abdomen. No acute hepatobiliary abnormality. Pancreas: Unremarkable. Spleen: Unremarkable. Adrenals/Urinary Tract: Unremarkable adrenal glands. Bilateral small cystic lesions statistically likely to represent benign cysts. No follow-up recommended. Geographic hypoattenuation in the cortex of the left kidney may be due to artifact from streak and motion versus pyelonephritis. No hydronephrosis. Unremarkable bladder. Stomach/Bowel: Normal caliber large and small bowel. Extensive colonic diverticulosis without evidence of diverticulitis. Stomach is within normal limits. Vascular/Lymphatic: IVC filter. Aortic atherosclerosis. No enlarged abdominal or pelvic lymph nodes. Reproductive: No acute abnormality. Other: No free intraperitoneal fluid or air. Musculoskeletal: IM rod and screw fixation right femur. Left THA. Chronic right pubic rami fractures. No acute fracture. Review of the MIP images confirms the above findings. IMPRESSION: 1. Negative for acute pulmonary embolism. 2. Interlobular septal thickening and patchy ground-glass opacities with bibasilar atelectasis or infiltrates. Findings favor atypical infection. 3. Geographic hypoattenuation in the cortex of the left kidney may be due to streak artifact and motion versus pyelonephritis. Correlate with urinalysis. 4. Subacute appearing left posterior 10th-11th rib fractures. 5. Extensive colonic diverticulosis without evidence of diverticulitis. Aortic Atherosclerosis (ICD10-I70.0). Electronically Signed   By: Norman Gatlin M.D.   On: 02/07/2023 00:47   DG Knee 2 Views Left Result Date: 02/07/2023 CLINICAL DATA:  Recent fall with knee pain, initial encounter EXAM: LEFT KNEE - 2 VIEW COMPARISON:  None Available. FINDINGS: Healed proximal fibular fracture is noted. No acute fracture or dislocation is noted. No soft tissue abnormality is seen. No joint effusion is noted. IMPRESSION: Healed proximal fibular  fracture.  No acute abnormality noted. Electronically Signed   By: Oneil Devonshire M.D.   On: 02/07/2023 00:31   DG Knee 2 Views Right Result Date: 02/07/2023 CLINICAL DATA:  Fall, pain EXAM: RIGHT KNEE - 1-2 VIEW COMPARISON:  None Available. FINDINGS: Chondrocalcinosis and mild degenerative changes throughout the right knee. No acute bony abnormality. Specifically, no fracture, subluxation, or dislocation. No joint effusion. Vascular calcifications noted. IMPRESSION: No acute bony abnormality. Electronically Signed   By: Franky Crease M.D.   On: 02/07/2023 00:31   DG Chest Port 1 View Result Date: 02/06/2023 CLINICAL DATA:  Questionable sepsis - evaluate for abnormality. Low O2 sats. EXAM: PORTABLE CHEST 1 VIEW COMPARISON:  07/14/2022 FINDINGS: Heart is borderline in size. Mediastinal contours within normal limits. Scattered aortic atherosclerosis. Questionable patchy opacity in the right upper lobe. No confluent opacity on the left. No effusions. No acute bony abnormality. Old healed left rib fractures. IMPRESSION: Questionable patchy right upper lobe opacity/infiltrate. Borderline heart size Electronically Signed   By: Franky Crease M.D.   On: 02/06/2023 23:28    Pending Labs Unresulted Labs (From admission, onward)     Start     Ordered   02/08/23 2100  Heparin  level (unfractionated)  Once-Timed,   TIMED        02/08/23 1224   02/08/23 0500  CBC  Daily,   R      02/07/23 0907   02/08/23 0500  Basic metabolic panel  Daily,   R  02/07/23 0907   02/07/23 0859  Legionella Pneumophila Serogp 1 Ur Ag  Add-on,   AD        02/07/23 0905            Vitals/Pain Today's Vitals   02/08/23 1100 02/08/23 1153 02/08/23 1300 02/08/23 1330  BP:   (!) 146/75 (!) 151/73  Pulse:   75 81  Resp:   (!) 29 (!) 33  Temp:  97.6 F (36.4 C)    TempSrc:  Axillary    SpO2: 92%  95% 95%  Weight:      Height:      PainSc:        Isolation Precautions Droplet precaution  Medications Medications   haloperidol  lactate (HALDOL ) injection 1 mg (has no administration in time range)  cefTRIAXone  (ROCEPHIN ) 2 g in sodium chloride  0.9 % 100 mL IVPB (0 g Intravenous Stopped 02/08/23 1243)  azithromycin  (ZITHROMAX ) 500 mg in sodium chloride  0.9 % 250 mL IVPB (0 mg Intravenous Stopped 02/08/23 0733)  acetaminophen  (TYLENOL ) tablet 650 mg (has no administration in time range)    Or  acetaminophen  (TYLENOL ) suppository 650 mg (has no administration in time range)  ondansetron  (ZOFRAN ) tablet 4 mg (has no administration in time range)    Or  ondansetron  (ZOFRAN ) injection 4 mg (has no administration in time range)  levothyroxine  (SYNTHROID ) tablet 125 mcg (125 mcg Oral Given 02/08/23 0950)  memantine  (NAMENDA ) tablet 5 mg (5 mg Oral Given 02/08/23 0952)  sertraline  (ZOLOFT ) tablet 25 mg (25 mg Oral Given 02/08/23 0951)  0.9 %  sodium chloride  infusion (0 mLs Intravenous Hold 02/08/23 0927)  heparin  ADULT infusion 100 units/mL (25000 units/250mL) (900 Units/hr Intravenous Rate/Dose Change 02/08/23 1252)  methylPREDNISolone  sodium succinate (SOLU-MEDROL ) 40 mg/mL injection 40 mg (40 mg Intravenous Given 02/08/23 0630)  ipratropium (ATROVENT ) nebulizer solution 0.5 mg (0.5 mg Nebulization Given 02/08/23 1335)  diltiazem  (CARDIZEM ) tablet 60 mg (60 mg Oral Given 02/08/23 1110)  sodium chloride  0.9 % bolus 1,500 mL (0 mLs Intravenous Stopped 02/07/23 0220)  heparin  bolus via infusion 3,000 Units (3,000 Units Intravenous Bolus from Bag 02/06/23 2334)  ceFEPIme  (MAXIPIME ) 2 g in sodium chloride  0.9 % 100 mL IVPB (0 g Intravenous Stopped 02/07/23 0030)  vancomycin  (VANCOREADY) IVPB 1250 mg/250 mL (0 mg Intravenous Stopped 02/07/23 0450)  iohexol  (OMNIPAQUE ) 350 MG/ML injection 75 mL (75 mLs Intravenous Contrast Given 02/07/23 0016)  furosemide  (LASIX ) injection 40 mg (40 mg Intravenous Given 02/08/23 1033)  heparin  bolus via infusion 850 Units (850 Units Intravenous Bolus from Bag 02/08/23 1253)    Mobility non-ambulatory      Focused Assessments Pulmonary Assessment Handoff:  Lung sounds: Bilateral Breath Sounds: Coarse crackles L Breath Sounds: Expiratory wheezes R Breath Sounds: Clear O2 Device: Nasal Cannula O2 Flow Rate (L/min): 6 L/min    R Recommendations: See Admitting Provider Note  Report given to:   Additional Notes:

## 2023-02-08 NOTE — Progress Notes (Signed)
   02/08/23 2209  Assess: MEWS Score  Temp 99.2 F (37.3 C)  BP (!) 156/76  MAP (mmHg) 98  Pulse Rate 94  ECG Heart Rate 93  Resp (!) 32  Level of Consciousness Alert  SpO2 93 %  O2 Device Nasal Cannula  O2 Flow Rate (L/min) 5 L/min  Assess: MEWS Score  MEWS Temp 0  MEWS Systolic 0  MEWS Pulse 0  MEWS RR 2  MEWS LOC 0  MEWS Score 2  MEWS Score Color Yellow  Assess: if the MEWS score is Yellow or Red  Were vital signs accurate and taken at a resting state? Yes  Does the patient meet 2 or more of the SIRS criteria? Yes  Does the patient have a confirmed or suspected source of infection? Yes  MEWS guidelines implemented  No, previously yellow, continue vital signs every 4 hours  Assess: SIRS CRITERIA  SIRS Temperature  0  SIRS Respirations  1  SIRS Pulse 1  SIRS WBC 0  SIRS Score Sum  2   Patient has a previous yellow MEWS

## 2023-02-08 NOTE — Progress Notes (Signed)
 ANTICOAGULATION CONSULT NOTE  Pharmacy Consult for heparin  infusion Indication: atrial fibrillation  Allergies  Allergen Reactions   Atorvastatin Cough   Patient Measurements: Height: 5' 2 (157.5 cm) Weight: 58 kg (127 lb 13.9 oz) IBW/kg (Calculated) : 50.1 Heparin  Dosing Weight: 58 kg  Vital Signs: Temp: 98.1 F (36.7 C) (01/07 2234) Temp Source: Oral (01/07 2234) BP: 171/98 (01/08 0000) Pulse Rate: 93 (01/08 0000)  Labs: Recent Labs    02/06/23 2314 02/07/23 0154 02/07/23 0740 02/07/23 1501 02/08/23 0228  HGB 11.7*  --   --   --   --   HCT 33.8*  --   --   --   --   PLT 354  --   --   --   --   APTT 32  --   --   --   --   LABPROT 15.9*  --   --   --   --   INR 1.3*  --   --   --   --   HEPARINUNFRC  --   --  0.33 0.75* 0.30  CREATININE 0.95  --   --   --   --   TROPONINIHS 21* 37*  --   --   --     Estimated Creatinine Clearance: 31.8 mL/min (by C-G formula based on SCr of 0.95 mg/dL).  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 In the ED they were noted to have atrial fibrillation with rapid ventricular response, currently in sinus rhythm.   Goal of Therapy:  Heparin  level 0.3-0.7 units/ml Monitor  platelets by anticoagulation protocol: Yes  Heparin  Levels Date/Time HL Clinical Assessment  1/7@0740  HL = 0.33 Therapeutic x 1  1/7@1501  HL = 0.75 SUPRAtherapeutic   1/08 0228 0.30 Therapeutic x 1           Plan:  Continue heparin  infusion at 800 units/ hr Will check confirmatory HL in 8 hrs CBC daily while on heparin   Rankin CANDIE Dills, PharmD, [facility] 02/08/2023 3:06 AM

## 2023-02-08 NOTE — Progress Notes (Signed)
 ANTICOAGULATION CONSULT NOTE  Pharmacy Consult for heparin  infusion Indication: atrial fibrillation  Allergies  Allergen Reactions   Atorvastatin Cough   Patient Measurements: Height: 5' 2 (157.5 cm) Weight: 58 kg (127 lb 13.9 oz) IBW/kg (Calculated) : 50.1 Heparin  Dosing Weight: 58 kg  Vital Signs: Temp: 97.6 F (36.4 C) (01/08 1153) Temp Source: Axillary (01/08 1153) BP: 167/87 (01/08 1000) Pulse Rate: 98 (01/08 1000)  Labs: Recent Labs    02/06/23 2314 02/07/23 0154 02/07/23 0740 02/07/23 1501 02/08/23 0228 02/08/23 0510 02/08/23 1104  HGB 11.7*  --   --   --   --  10.8*  --   HCT 33.8*  --   --   --   --  31.4*  --   PLT 354  --   --   --   --  326  --   APTT 32  --   --   --   --   --   --   LABPROT 15.9*  --   --   --   --   --   --   INR 1.3*  --   --   --   --   --   --   HEPARINUNFRC  --   --    < > 0.75* 0.30  --  0.21*  CREATININE 0.95  --   --   --   --  0.64  --   TROPONINIHS 21* 37*  --   --   --   --   --    < > = values in this interval not displayed.    Estimated Creatinine Clearance: 37.7 mL/min (by C-G formula based on SCr of 0.64 mg/dL).  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 In  the ED they were noted to have atrial fibrillation with rapid ventricular response, currently in sinus rhythm.   Goal of Therapy:  Heparin  level 0.3-0.7 units/ml Monitor platelets by anticoagulation protocol: Yes  Heparin  Levels Date/Time HL Clinical Assessment  1/7@0740  HL = 0.33 Therapeutic x 1  1/7@1501  HL = 0.75 SUPRAtherapeutic   1/08 0228 0.30 Therapeutic x 1  1/8 1104 0.21 Subtherapeutic        No issues with infusion reported, no signs/symptoms of bleeding noted.  Plan:  Give heparin  bolus of 850 units x1 Increase heparin  infusion to 900 units Will check confirmatory HL in 8 hrs CBC daily while on heparin   Thank you for involving pharmacy in this patient's care.   Damien Napoleon, PharmD Clinical Pharmacist 02/08/2023 12:23 PM

## 2023-02-08 NOTE — Progress Notes (Signed)
 ANTICOAGULATION CONSULT NOTE  Pharmacy Consult for heparin  infusion Indication: atrial fibrillation  Allergies  Allergen Reactions   Atorvastatin Cough   Patient Measurements: Height: 5' 2 (157.5 cm) Weight: 58 kg (127 lb 13.9 oz) IBW/kg (Calculated) : 50.1 Heparin  Dosing Weight: 58 kg  Vital Signs: Temp: 99.2 F (37.3 C) (01/08 2209) Temp Source: Axillary (01/08 2209) BP: 156/76 (01/08 2209) Pulse Rate: 94 (01/08 2209)  Labs: Recent Labs    02/06/23 2314 02/07/23 0154 02/07/23 0740 02/08/23 0228 02/08/23 0510 02/08/23 1104 02/08/23 2138  HGB 11.7*  --   --   --  10.8*  --   --   HCT 33.8*  --   --   --  31.4*  --   --   PLT 354  --   --   --  326  --   --   APTT 32  --   --   --   --   --   --   LABPROT 15.9*  --   --   --   --   --   --   INR 1.3*  --   --   --   --   --   --   HEPARINUNFRC  --   --    < > 0.30  --  0.21* 0.17*  CREATININE 0.95  --   --   --  0.64  --   --   TROPONINIHS 21* 37*  --   --   --   --   --    < > = values in this interval not displayed.    Estimated Creatinine Clearance: 37.7 mL/min (by C-G formula based on SCr of 0.64 mg/dL).  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 In  the ED they were noted to have atrial fibrillation with rapid ventricular response, currently in sinus rhythm.   Goal of Therapy:  Heparin  level 0.3-0.7 units/ml Monitor platelets by anticoagulation protocol: Yes  Heparin  Levels Date/Time HL Clinical Assessment  1/7@0740  HL = 0.33 Therapeutic x 1  1/7@1501  HL = 0.75 SUPRAtherapeutic   1/08 0228 0.30 Therapeutic x 1  1/8 1104 0.21 Subtherapeutic   1/8 2138 0.17 Subtherapeutic   No issues with infusion reported, no signs/symptoms of bleeding noted.  Plan:  Give heparin  bolus of 1750 units x1 Increase heparin  infusion to 1050 units Will check HL in 8 hrs CBC daily while on heparin   Thank you for involving pharmacy in this patient's care.   Lum Mania, PharmD Clinical Pharmacist 02/08/2023 10:20 PM

## 2023-02-08 NOTE — ED Notes (Signed)
 Pt removed HHFNC and refused to allow this RN to put it back in place. Pt oxygen saturation 82%. Placed pt back on [place] at 6L with improvement to 93%.

## 2023-02-09 DIAGNOSIS — J189 Pneumonia, unspecified organism: Secondary | ICD-10-CM | POA: Diagnosis not present

## 2023-02-09 DIAGNOSIS — R0682 Tachypnea, not elsewhere classified: Secondary | ICD-10-CM

## 2023-02-09 DIAGNOSIS — R140 Abdominal distension (gaseous): Secondary | ICD-10-CM | POA: Diagnosis not present

## 2023-02-09 DIAGNOSIS — B348 Other viral infections of unspecified site: Secondary | ICD-10-CM

## 2023-02-09 DIAGNOSIS — J9601 Acute respiratory failure with hypoxia: Secondary | ICD-10-CM | POA: Diagnosis not present

## 2023-02-09 DIAGNOSIS — I4891 Unspecified atrial fibrillation: Secondary | ICD-10-CM | POA: Diagnosis not present

## 2023-02-09 LAB — HEPARIN LEVEL (UNFRACTIONATED)
Heparin Unfractionated: 0.53 [IU]/mL (ref 0.30–0.70)
Heparin Unfractionated: 0.48 [IU]/mL (ref 0.30–0.70)

## 2023-02-09 LAB — CBC
HCT: 29.6 % — ABNORMAL LOW (ref 36.0–46.0)
Hemoglobin: 10.3 g/dL — ABNORMAL LOW (ref 12.0–15.0)
MCH: 31.2 pg (ref 26.0–34.0)
MCHC: 34.8 g/dL (ref 30.0–36.0)
MCV: 89.7 fL (ref 80.0–100.0)
Platelets: 313 10*3/uL (ref 150–400)
RBC: 3.3 MIL/uL — ABNORMAL LOW (ref 3.87–5.11)
RDW: 15.1 % (ref 11.5–15.5)
WBC: 23.1 10*3/uL — ABNORMAL HIGH (ref 4.0–10.5)
nRBC: 0 % (ref 0.0–0.2)

## 2023-02-09 LAB — LEGIONELLA PNEUMOPHILA SEROGP 1 UR AG: L. pneumophila Serogp 1 Ur Ag: NEGATIVE

## 2023-02-09 LAB — BASIC METABOLIC PANEL
Anion gap: 12 (ref 5–15)
BUN: 26 mg/dL — ABNORMAL HIGH (ref 8–23)
CO2: 24 mmol/L (ref 22–32)
Calcium: 8.9 mg/dL (ref 8.9–10.3)
Chloride: 106 mmol/L (ref 98–111)
Creatinine, Ser: 0.82 mg/dL (ref 0.44–1.00)
GFR, Estimated: >60 mL/min (ref >60)
Glucose, Bld: 191 mg/dL — ABNORMAL HIGH (ref 70–99)
Potassium: 3.7 mmol/L (ref 3.5–5.1)
Sodium: 142 mmol/L (ref 135–145)

## 2023-02-09 MED ORDER — AZITHROMYCIN 250 MG PO TABS
500.0000 mg | ORAL_TABLET | Freq: Every day | ORAL | Status: DC
  Administered 2023-02-10: 500 mg via ORAL
  Filled 2023-02-09: qty 2

## 2023-02-09 MED ORDER — ALBUTEROL SULFATE (2.5 MG/3ML) 0.083% IN NEBU: 2.5000 mg | INHALATION_SOLUTION | Freq: Four times a day (QID) | RESPIRATORY_TRACT | Status: DC | PRN

## 2023-02-09 MED ORDER — FUROSEMIDE 10 MG/ML IJ SOLN
40.0000 mg | Freq: Once | INTRAMUSCULAR | Status: AC
  Administered 2023-02-09: 40 mg via INTRAVENOUS
  Filled 2023-02-09: qty 4

## 2023-02-09 NOTE — Hospital Course (Addendum)
 Hospital course / significant events:   HPI: Lisa Martin is a 89 y.o. female with medical history significant for advanced dementia,  HTN, dCHF, hypothyroidism, depression with anxiety, GERD, breast cancer (s/p of radiation and chemotherapy), protein calorie malnutrition, prior falls with resulting hip fracture, rib fractures, with most recent fall 07/2022 with pelvic fracture treated nonoperatively, who presents from memory care unit with concerns for tachycardia and tachypnea with hypoxia to 89% requiring 6 L by EMS to maintain sats in the mid 90s.  She was also tachycardic to 170.  Patient unable to contribute to history due to dementia.   01/06: admitted to hospitalist service for sepsis d/t pneumonia, (+)Rhinovirus, AfibRVR 01/07: converted to sinus rhythm. Echo w/ preserved EF Grade 1 diast df  01/08: titrated po diltiazem  ER 120 mg daily. Tachypneic, not tolerating BiPap 01/09-01/10: sleepy but easily awoken, confused/disoriented, tachypneic but not in serious distress, not eating. Consider for palliative if not improving. Not responding to cues for po intake. See IPAL note.  01/11: more hypoxic overnight required BiPap but this caused agitation. Into Afib RVR this am - see note. D/w daughter, will continue tx as able, dilt IVx1 did not convert, placed on dilt gtt and cardiology consult. Pt able to come off BiPap and more calm mid-day. Closely monitoring 01/12: stable but still very sleepy, not engaging w/ SLP for feeding, will not meet nutrition needs. Amiodarone  converted to po. Off drips today in sinus rhythm. Slightly worsening hypernatremia and WBC, monitoring  2023/03/03: Afib RVR again overnight, restarted amio gtt. CXR this AM appears worsening R lobe infiltrate, high suspicion for aspiration, switching to Zosyn . Rate improved on amio, pt appears comfortable. Palliative consult pending and daughter is open to speaking w/ hospice - will arrange    Consultants:  Cardiology  Palliative Care    Procedures/Surgeries: none      ASSESSMENT & PLAN:   CAP Rhinovirus Sepsis d/t pneumonia  Suspect aspiration pneumonitis / pneumonia  Worsening appearance on CXR question d/t IV fluids vs aspiration vs progressive disease  ceftriaxone /azithromycin  --> Zosyn  today  Prn ipratropium breathing treatments. Holding on albuterol  given a-fib   Acute hypoxic respiratory failure - stabilized  2/2 pneumonia.  BiPAP/CPAP prn but she does not tolerate the mask and becomes very agitated Hold IV fluids while maintaining BP since seemed to get worse w/ fluids few days ago steroids d/t worsening lung infiltrate but does not seem to be helping, will d/c  Nebulizers prn   Fever, leukocytosis + lung infiltrate = sepsis again 2023-03-03  UA pending for completeness Broadened abx to Zosyn , suspect aspiration pna  - see above  Monitor sepsis parameters   A-fib RVR, converted to sinus then back to Afib RVR  continue heparin  Amiodarone  given low BP w/ diltiazem , back on amio gtt now  Cardiology following    Right foot pain No erythema. X-rays no fracture or other acute process monitor   Hypothyroid Tsh wnl cont home levothyroxine    Advanced alzheimer's dementia cont home memantine , seroquel , rivastigmine  Affecting swallow functioning GOC discussions ongoing given overall declining function   MDD home sertraline   Goals of care / Advanced care planning See IPAL note  Daughter and I discussed few days ago re: poor prognosis, pt is not likely to improve to baseline, and if not eating she will certainly not have much life expectancy left. Daughter states pt would not want feeding tubes.  Spoke to daughter again yesterday, she is open to hospice discussions, is reluctant to deescalate care  otherwise at this time. Appreciate assistance of palliative care re: GOC clarification and completion MOST form    DVT prophylaxis: heparin  IV fluids: no continuous IV fluids  Nutrition: dysphagia, low  PO intake  Central lines / invasive devices: none  Code Status: DNR ACP documentation reviewed:  none on file in [facility]  TOC needs: TBD Barriers to dispo / significant pending items: clinical illness, suspect may qualify for hospice if family decides to pursue that route

## 2023-02-09 NOTE — Progress Notes (Signed)
 ANTICOAGULATION CONSULT NOTE  Pharmacy Consult for heparin  infusion Indication: atrial fibrillation  Allergies  Allergen Reactions   Atorvastatin Cough   Patient Measurements: Height: 5' 2 (157.5 cm) Weight: 58 kg (127 lb 13.9 oz) IBW/kg (Calculated) : 50.1 Heparin  Dosing Weight: 58 kg  Vital Signs: Temp: 98.4 F (36.9 C) (01/09 0404) Temp Source: Axillary (01/09 0357) BP: 131/64 (01/09 1240) Pulse Rate: 86 (01/09 0800)  Labs: Recent Labs    02/06/23 2314 02/07/23 0154 02/07/23 0740 02/08/23 0510 02/08/23 1104 02/08/23 2138 02/09/23 0634 02/09/23 1418  HGB 11.7*  --   --  10.8*  --   --  10.3*  --   HCT 33.8*  --   --  31.4*  --   --  29.6*  --   PLT 354  --   --  326  --   --  313  --   APTT 32  --   --   --   --   --   --   --   LABPROT 15.9*  --   --   --   --   --   --   --   INR 1.3*  --   --   --   --   --   --   --   HEPARINUNFRC  --   --    < >  --    < > 0.17* 0.53 0.48  CREATININE 0.95  --   --  0.64  --   --  0.82  --   TROPONINIHS 21* 37*  --   --   --   --   --   --    < > = values in this interval not displayed.   Estimated Creatinine Clearance: 36.8 mL/min (by C-G formula based on SCr of 0.82 mg/dL).  Medical History: 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   Obesity, unspecified    Personal history of chemotherapy    Personal history of malignant neoplasm of breast 2000   Personal history of radiation therapy    Personal history of tobacco use, presenting hazards to health    Special screening for malignant neoplasms, colon    Thyroid  disease 2012   hyperactive thyroid    Assessment: Pt is a 89 yo female presenting to  ED from SNF due to tachypnea, tachycardia, and hypoxia. In the ED they were noted to have atrial fibrillation with rapid ventricular response, currently in sinus rhythm. Pharmacy has been consulted for initiation and management of heparin  infusion for atrial fibrillation.   Goal of Therapy:  Heparin  level 0.3-0.7 units/ml Monitor platelets by anticoagulation protocol: Yes  Heparin  Levels Date/Time HL Clinical Assessment  1/7@0740   0.33 Therapeutic x 1  1/7@1501   0.75 SUPRAtherapeutic   1/8 @ 0228 0.30 Therapeutic x 1  1/8 @ 1104 0.21 Subtherapeutic   1/8 @ 2138 0.17 Subtherapeutic  1/9 @ 0634 0.53 Therapeutic x 1   1/9 @ 1418 0.48 Therapeutic x 2    Plan:  HL therapeutic x 2  Continue heparin  infusion at current rate of 1050 units/hr  Check next HL tomorrow morning  with AM labs  CBC stable; monitor daily and for s/sx of bleeding   Thank you for involving pharmacy in this patient's care.   Evonnie Nieves, PharmD Pharmacy Resident  02/09/2023 3:39 PM

## 2023-02-09 NOTE — Plan of Care (Signed)
  Problem: Fluid Volume: Goal: Hemodynamic stability will improve Outcome: Not Progressing   Problem: Education: Goal: Knowledge of General Education information will improve Description: Including pain rating scale, medication(s)/side effects and non-pharmacologic comfort measures Outcome: Not Progressing   Problem: Respiratory: Goal: Ability to maintain adequate ventilation will improve Outcome: Not Progressing

## 2023-02-09 NOTE — Progress Notes (Signed)
 Speech Language Pathology Treatment: Dysphagia  Patient Details Name: Lisa Martin MRN: [id number] DOB: 1931/08/25 Today's Date: 02/09/2023 Time: 1025-1100 SLP Time Calculation (min) (ACUTE ONLY): 35 min  Assessment / Plan / Recommendation Clinical Impression  Pt seen for ongoing assessment of swallowing; toleration of diet. Eyes closed the majority of session -- less fidgity w/ Mitts on hands than at eval 2 days ago. Able to respond to few basic questions re: self but much less engaged. Baseline Dementia.  On [place] O2 5L; afebrile. O2 sats in low 90s. WBC elevated as at eval. Min expiratory wheezing w/ exertion and congested.  Pt explained general aspiration precautions w/ no true verbal response/acknowledgement. Pt assisted w/ positioning d/t weakness. She was given trials of purees and Nectar liquids via spoon. Trials of increased texture and thin liquids were NOT attempted d/t Cognitive/State presentation and Pulmonary presentation.  No overt clinical s/s of aspiration were noted w/ consistencies; respiratory status remained at its Baseline w/ min effortful inspiratory/expiratory phases. Vocal quality clear b/t trials; no immediate cough. Oral phase appeared grossly [facility] for bolus management and timely A-P transfer for swallowing; min slower oral clearing noted intermittently w/ consistencies -- suspect d/t decreased attention to task, eyes closed/disengaged slightly. NSG denied any deficits in swallowing crushed Pills but pt had not eaten at breakfast meal per report.    Due to pt's declined engagement and attention during po tasks, as well as her declined Pulmonary status baseline, her risk for aspiration/aspiration pneumonia is increased during oral intake. Recommend continuing Nectar liquids in diet w/ a more Pureed diet consistency vs Minced, in order to lessen effort w/ task. Gravies added to moisten foods. Recommend aspiration precautions; Pills CRUSHED in Puree; tray setup and positioning  assistance for meals. Feeding assistance at meals = only given po's when fully alert and engaged in task.  ST services will continue to monitor status and presentation. Discussed w/ MD post session; recommend Palliative Care discussion of GOC. Suspect pt's Cognitive decline and Baseline Dementia will impact swallowing and overall intake increasing risk for aspiration/aspiration pneumonia and hamper upgrade of diet consistency. MD to discuss w/ Family.  NSG updated. Precautions posted at bedside.       HPI HPI: Pt is an 89 year old woman admitted with concerns for pneumonia and sepsis complicated by paroxysmal atrial fibrillation with rapid ventricular response. She presented w/ tachypnea and hypoxia. PMH includes: HTN, dCHF, hypothyroidism, depression with anxiety, GERD, breast cancer (s/p of radiation and chemotherapy), protein calorie malnutrition, prior falls with resulting hip fracture, rib fractures, with most recent fall 07/2022 with pelvic fracture treated nonoperatively, resides at a memory care unit.   CT of Chest:  No PE.  Interlobular septal thickening and patchy ground-glass opacities  with bibasilar atelectasis or infiltrates. Findings favor atypical  infection.  3. Geographic hypoattenuation in the cortex of the left kidney may  be due to streak artifact and motion versus pyelonephritis.  Correlate with urinalysis.  4. Subacute appearing left posterior 10th-11th rib fractures.  5. Extensive colonic diverticulosis without evidence of  diverticulitis.      SLP Plan  Continue with current plan of care      Recommendations for follow up therapy are one component of a multi-disciplinary discharge planning process, led by the attending physician.  Recommendations may be updated based on patient status, additional functional criteria and insurance authorization.    Recommendations  Diet recommendations: Dysphagia 1 (puree);Nectar-thick liquid Liquids provided via: Teaspoon;Cup Medication  Administration: Crushed with puree Supervision: Staff to  assist with self feeding;Full supervision/cueing for compensatory strategies Compensations: Minimize environmental distractions;Slow rate;Small sips/bites;Lingual sweep for clearance of pocketing;Follow solids with liquid Postural Changes and/or Swallow Maneuvers: Out of bed for meals;Seated upright 90 degrees;Upright 30-60 min after meal                 (Palliative Care consult for GOC; dietician f/u) Oral care BID;Oral care before and after PO;Staff/trained caregiver to provide oral care   Frequent or constant Supervision/Assistance Dysphagia, oropharyngeal phase (R13.12) (in setting of acute illness and Dementia/confusion)     Continue with current plan of care       Comer Portugal, MS, CCC-SLP Speech Language Pathologist Rehab Services; [facility] [phone] (ascom) Cayli Escajeda  02/09/2023, 3:20 PM

## 2023-02-09 NOTE — Progress Notes (Signed)
 Rounding Note    Patient Name: Lisa Martin Date of Encounter: 02/09/2023   [facility] Cardiologist: Lonni Hanson, MD   Subjective   Patient is somnolent on exam. She remains in sinus rhythm. Requiring 5 L supplemental O2.   Inpatient Medications    Scheduled Meds:  diltiazem   60 mg Oral Q12H   furosemide   40 mg Intravenous Once   levothyroxine   125 mcg Oral Q0600   memantine   5 mg Oral BID   methylPREDNISolone  (SOLU-MEDROL ) injection  40 mg Intravenous Q12H   sertraline   25 mg Oral Daily   Continuous Infusions:  azithromycin  500 mg (02/09/23 0532)   cefTRIAXone  (ROCEPHIN )  IV Stopped (02/08/23 1243)   heparin  1,050 Units/hr (02/08/23 2251)   PRN Meds: acetaminophen  **OR** acetaminophen , albuterol , haloperidol  lactate, ondansetron  **OR** ondansetron  (ZOFRAN ) IV   Vital Signs    Vitals:   02/09/23 0000 02/09/23 0200 02/09/23 0357 02/09/23 0404  BP: (!) 145/78 (!) 149/82 (!) 162/85 (!) 142/91  Pulse: 83 80  (!) 102  Resp: (!) 31 (!) 29  18  Temp:   98.2 F (36.8 C) 98.4 F (36.9 C)  TempSrc:   Axillary   SpO2: 94% 94%  94%  Weight:      Height:        Intake/Output Summary (Last 24 hours) at 02/09/2023 1013 Last data filed at 02/09/2023 0358 Gross per 24 hour  Intake 647.31 ml  Output 1500 ml  Net -852.69 ml      02/06/2023   11:11 PM 07/14/2022    5:02 PM 06/22/2022    9:07 PM  Last 3 Weights  Weight (lbs) 127 lb 13.9 oz 100 lb 14.4 oz 110 lb  Weight (kg) 58 kg 45.768 kg 49.896 kg      Telemetry    Sinus rhythm with frequent PACs, rate 70-80 bpm - Personally Reviewed  Physical Exam   GEN: No acute distress. Mittens in place. Neck: No JVD Cardiac: RRR, no murmurs, rubs, or gallops.  Respiratory: Coarse breath sounds with expiratory wheezing noted bilaterally GI: Soft, nontender, non-distended  MS: No edema; No deformity. Neuro:  Nonfocal  Psych: Calm  Labs    High Sensitivity Troponin:   Recent Labs  Lab 02/06/23 2314  02/07/23 0154  TROPONINIHS 21* 37*     Chemistry Recent Labs  Lab 02/06/23 2314 02/08/23 0510 02/09/23 0634  NA 139 142 142  K 4.0 4.0 3.7  CL 105 110 106  CO2 21* 20* 24  GLUCOSE 177* 158* 191*  BUN 31* 16 26*  CREATININE 0.95 0.64 0.82  CALCIUM 8.6* 8.8* 8.9  PROT 7.3  --   --   ALBUMIN 3.7  --   --   AST 21  --   --   ALT 20  --   --   ALKPHOS 71  --   --   BILITOT 1.1  --   --   GFRNONAA 57* >60 >60  ANIONGAP 13 12 12     Lipids No results for input(s): CHOL, TRIG, HDL, LABVLDL, LDLCALC, CHOLHDL in the last 168 hours.  Hematology Recent Labs  Lab 02/06/23 2314 02/08/23 0510 02/09/23 0634  WBC 22.9* 20.1* 23.1*  RBC 3.74* 3.49* 3.30*  HGB 11.7* 10.8* 10.3*  HCT 33.8* 31.4* 29.6*  MCV 90.4 90.0 89.7  MCH 31.3 30.9 31.2  MCHC 34.6 34.4 34.8  RDW 15.0 15.1 15.1  PLT 354 326 313   Thyroid   Recent Labs  Lab 02/06/23 2314  TSH  3.679  FREET4 1.34*    BNPNo results for input(s): BNP, PROBNP in the last 168 hours.  DDimer No results for input(s): DDIMER in the last 168 hours.   Radiology    ECHOCARDIOGRAM COMPLETE Result Date: 02/07/2023    ECHOCARDIOGRAM REPORT   Patient Name:   Lisa Martin Date of Exam: 02/07/2023 Medical Rec #:  [id number]   Height:       62.0 in Accession #:    [id number]  Weight:       127.9 lb Date of Birth:  12-07-1931   BSA:          1.581 m Patient Age:    89 years    BP:           125/67 mmHg Patient Gender: F           HR:           77 bpm. Exam Location:  [facility] Procedure: 2D Echo, Cardiac Doppler and Color Doppler Indications:     Atrial Fibrillation  History:         Patient has no prior history of Echocardiogram examinations.                  CHF, Arrythmias:Atrial Fibrillation, Signs/Symptoms:Altered                  Mental Status; Risk Factors:Hypertension.  Sonographer:     Naomie Reef Referring Phys:  [id number] DELAYNE LULLA SOLIAN Diagnosing Phys: Lonni Hanson MD  Sonographer Comments: Technically difficult study due  to poor echo windows. Image acquisition challenging due to patient behavioral factors. IMPRESSIONS  1. Left ventricular ejection fraction, by estimation, is >55%. The left ventricle has normal function. Left ventricular endocardial border not optimally defined to evaluate regional wall motion. Left ventricular diastolic parameters are consistent with Grade I diastolic dysfunction (impaired relaxation).  2. Right ventricular systolic function is normal. The right ventricular size is normal.  3. Left atrial size was mildly dilated.  4. The mitral valve is normal in structure. Trivial mitral valve regurgitation. No evidence of mitral stenosis.  5. Tricuspid valve regurgitation is moderate.  6. The aortic valve was not well visualized. Aortic valve regurgitation is trivial. FINDINGS  Left Ventricle: Left ventricular ejection fraction, by estimation, is >55%. The left ventricle has normal function. Left ventricular endocardial border not optimally defined to evaluate regional wall motion. The left ventricular internal cavity size was  normal in size. There is borderline left ventricular hypertrophy. Left ventricular diastolic parameters are consistent with Grade I diastolic dysfunction (impaired relaxation). Right Ventricle: The right ventricular size is normal. No increase in right ventricular wall thickness. Right ventricular systolic function is normal. Left Atrium: Left atrial size was mildly dilated. Right Atrium: Right atrial size was normal in size. Pericardium: The pericardium was not well visualized. Mitral Valve: The mitral valve is normal in structure. Trivial mitral valve regurgitation. No evidence of mitral valve stenosis. MV peak gradient, 5.1 mmHg. The mean mitral valve gradient is 2.0 mmHg. Tricuspid Valve: The tricuspid valve is not well visualized. Tricuspid valve regurgitation is moderate. Aortic Valve: The aortic valve was not well visualized. Aortic valve regurgitation is trivial. Aortic valve mean  gradient measures 2.0 mmHg. Aortic valve peak gradient measures 5.5 mmHg. Aortic valve area, by VTI measures 2.27 cm. Pulmonic Valve: The pulmonic valve was not well visualized. Pulmonic valve regurgitation is trivial. No evidence of pulmonic stenosis. Aorta: The aortic root is normal in size and structure. Pulmonary Artery:  The pulmonary artery is not well seen. Venous: The inferior vena cava was not well visualized. IAS/Shunts: The interatrial septum was not well visualized.  LEFT VENTRICLE PLAX 2D LVIDd:         4.20 cm   Diastology LVIDs:         2.50 cm   LV e' medial:    7.72 cm/s LV PW:         0.90 cm   LV E/e' medial:  11.8 LV IVS:        1.10 cm   LV e' lateral:   8.16 cm/s LVOT diam:     2.00 cm   LV E/e' lateral: 11.2 LV SV:         57 LV SV Index:   36 LVOT Area:     3.14 cm  LEFT ATRIUM           Index        RIGHT ATRIUM           Index LA diam:      2.90 cm 1.83 cm/m   RA Area:     12.84 cm LA Vol (A2C): 37.6 ml 23.79 ml/m  RA Volume:   29.17 ml  18.45 ml/m LA Vol (A4C): 65.7 ml 41.55 ml/m  AORTIC VALVE                    PULMONIC VALVE AV Area (Vmax):    2.79 cm     PV Vmax:       0.90 m/s AV Area (Vmean):   2.77 cm     PV Peak grad:  3.2 mmHg AV Area (VTI):     2.27 cm AV Vmax:           117.00 cm/s AV Vmean:          70.200 cm/s AV VTI:            0.251 m AV Peak Grad:      5.5 mmHg AV Mean Grad:      2.0 mmHg LVOT Vmax:         104.00 cm/s LVOT Vmean:        62.000 cm/s LVOT VTI:          0.181 m LVOT/AV VTI ratio: 0.72  AORTA Ao Root diam: 2.90 cm MITRAL VALVE               TRICUSPID VALVE MV Area (PHT): 4.24 cm    TR Peak grad:   33.4 mmHg MV Area VTI:   1.60 cm    TR Vmax:        289.00 cm/s MV Peak grad:  5.1 mmHg MV Mean grad:  2.0 mmHg    SHUNTS MV Vmax:       1.13 m/s    Systemic VTI:  0.18 m MV Vmean:      66.2 cm/s   Systemic Diam: 2.00 cm MV Decel Time: 179 msec MV E velocity: 91.20 cm/s MV A velocity: 93.30 cm/s MV E/A ratio:  0.98 Lonni End MD Electronically signed  by Lonni Hanson MD Signature Date/Time: 02/07/2023/2:33:21 PM    Final     Cardiac Studies   2D echo 02/07/2023: 1. Left ventricular ejection fraction, by estimation, is >55%. The left  ventricle has normal function. Left ventricular endocardial border not  optimally defined to evaluate regional wall motion. Left ventricular  diastolic parameters are consistent with  Grade I diastolic dysfunction (impaired relaxation).  2. Right ventricular systolic function is normal. The right ventricular  size is normal.   3. Left atrial size was mildly dilated.   4. The mitral valve is normal in structure. Trivial mitral valve  regurgitation. No evidence of mitral stenosis.   5. Tricuspid valve regurgitation is moderate.   6. The aortic valve was not well visualized. Aortic valve regurgitation  is trivial.   Patient Profile     89 y.o. female with history of advanced dementia, breast cancer, recurrent falls resulting with hip and rib fractures with most recent fall in 07/2022 resulting in pelvic fracture treated nonoperatively, HTN, hypothyroidism, depression, and anxiety who is admitted with acute hypoxic respiratory failure and sepsis secondary to community-acquired pneumonia and is being seen today for the further evaluation of A-fib with RVR.   Assessment & Plan   Newly diagnosed atrial fibrillation with RVR - Presented in afib with RVR with spontaneous conversion to sinus rhythm followed by recurrent afib RVR with subsequent spontaneous conversion to NSR at 0210 on 1/7. Has remained in sinus rhythm since. Suspect in the setting of acute pulmonary illness. - TSH wnl, T4 mildly elevated - K 3.7, continue to monitor and replete as necessary with goal K > 4 - Echo with preserved LVEF - Continue IV heparin  during admission, though patient is not likely a good candidate for long-term OAC given advanced dementia with recurrent falls - Started on diltiazem  ER 120 mg daily yesterday 1/8 for high risk  of arrhythmia recurrence, elevated HR and BP.  Acute hypoxic respiratory failure with sepsis secondary to CAP complicated by advanced dementia Rhinovirus - Given IV Lasix  40 mg yesterday with net output -852 mL, will repeat dose today - Remains on 5 L supplemental O2 - Management per IM    For questions or updates, please contact [facility] Please consult [email or web address] for contact info under        Signed, Lesley LITTIE Maffucci, PA-C  02/09/2023, 10:13 AM

## 2023-02-09 NOTE — Progress Notes (Signed)
 PROGRESS NOTE    Lisa Martin   FMW:[id number] DOB: [birth year]  DOA: 02/06/2023 Date of Service: 02/09/23 which is hospital day 2  PCP: Cleotilde Oneil FALCON, MD    Hospital course / significant events:   HPI: Lisa Martin is a 89 y.o. female with medical history significant for advanced dementia,  HTN, dCHF, hypothyroidism, depression with anxiety, GERD, breast cancer (s/p of radiation and chemotherapy), protein calorie malnutrition, prior falls with resulting hip fracture, rib fractures, with most recent fall 07/2022 with pelvic fracture treated nonoperatively, who presents from memory care unit with concerns for tachycardia and tachypnea with hypoxia to 89% requiring 6 L by EMS to maintain sats in the mid 90s.  She was also tachycardic to 170.  Patient unable to contribute to history due to dementia.   01/06: admitted to hospitalist service for sepsis d/t pneumonia, (+)Rhinovirus, AfibRVR 01/07: converted to sinus rhythm. Echo w/ preserved EF Grade 1 diast df  01/08: titrated po diltiazem  ER 120 mg daily. Tachypneic, not tolerating BiPap 01/09: sleepy but easily awoken, confused/disoriented, tachypneic but not in serious distress, not eating. Consider for palliative if not improving      Consultants:  Cardiology   Procedures/Surgeries: none      ASSESSMENT & PLAN:   CAP Rhinovirus Sepsis d/t pneumonia  continue ceftriaxone /azithromycin  continue methylpred (no copd history but extensive prior smoking history) will also trial some ipratropium breathing treatments. Holding on albuterol  given a-fib   Acute hypoxic respiratory failure 2/2 pneumonia.  No CO2 retention on most recent VBG continue [place] O2 trial of bipap   A-fib with rvr Appears to be a new dx. Hr improved continue heparin  TTE pending cardiology following   Right foot pain No erythema. X-rays no fracture or other acute process monitor   Hypothyroid Tsh wnl cont home levothyroxine    Advanced alzheimer's  dementia cont home memantine , seroquel , rivastigmine    MDD home sertraline      DVT prophylaxis: heparin  IV fluids: no continuous IV fluids  Nutrition: dysphagia, low PO intake  Central lines / invasive devices: none  Code Status: DNR ACP documentation reviewed:  none on file in [facility]  TOC needs: TBD Barriers to dispo / significant pending items: clinical illness              Subjective / Brief ROS:  Patient sleepy but awakens easily  Reports she needs elev dollars for something but can't remember what  Denies CP/SOB, pain  Family Communication: called daughter 02/09/23 5:42 PM no answer left HIPAA compliant voicemail     Objective Findings:  Vitals:   02/09/23 0404 02/09/23 0800 02/09/23 1036 02/09/23 1240  BP: (!) 142/91 (!) 145/97 (!) 140/64 131/64  Pulse: (!) 102 86    Resp: 18 (!) 30    Temp: 98.4 F (36.9 C)     TempSrc:      SpO2: 94% 93%    Weight:      Height:        Intake/Output Summary (Last 24 hours) at 02/09/2023 1742 Last data filed at 02/09/2023 1700 Gross per 24 hour  Intake 561.39 ml  Output 1950 ml  Net -1388.61 ml   Filed Weights   02/06/23 2311  Weight: 58 kg    Examination:  Physical Exam Constitutional:      General: She is not in acute distress. Pulmonary:     Effort: Tachypnea present. No respiratory distress.     Breath sounds: Decreased breath sounds and wheezing present.  Musculoskeletal:  Right lower leg: No edema.     Left lower leg: No edema.  Skin:    General: Skin is warm and dry.  Neurological:     Mental Status: She is disoriented.  Psychiatric:        Behavior: Behavior normal.          Scheduled Medications:   [START ON 02/10/2023] azithromycin   500 mg Oral Daily   diltiazem   60 mg Oral Q12H   levothyroxine   125 mcg Oral Q0600   memantine   5 mg Oral BID   methylPREDNISolone  (SOLU-MEDROL ) injection  40 mg Intravenous Q12H   sertraline   25 mg Oral Daily    Continuous Infusions:   cefTRIAXone  (ROCEPHIN )  IV Stopped (02/09/23 1345)   heparin  1,050 Units/hr (02/09/23 1538)    PRN Medications:  acetaminophen  **OR** acetaminophen , albuterol , haloperidol  lactate, ondansetron  **OR** ondansetron  (ZOFRAN ) IV  Antimicrobials from admission:  Anti-infectives (From admission, onward)    Start     Dose/Rate Route Frequency Ordered Stop   02/10/23 0600  azithromycin  (ZITHROMAX ) tablet 500 mg        500 mg Oral Daily 02/09/23 1429 02/12/23 0559   02/07/23 1200  cefTRIAXone  (ROCEPHIN ) 2 g in sodium chloride  0.9 % 100 mL IVPB        2 g 200 mL/hr over 30 Minutes Intravenous Every 24 hours 02/07/23 0147 02/12/23 1159   02/07/23 0600  azithromycin  (ZITHROMAX ) 500 mg in sodium chloride  0.9 % 250 mL IVPB  Status:  Discontinued        500 mg 250 mL/hr over 60 Minutes Intravenous Every 24 hours 02/07/23 0147 02/09/23 1429   02/07/23 0000  vancomycin  (VANCOREADY) IVPB 1250 mg/250 mL        1,250 mg 166.7 mL/hr over 90 Minutes Intravenous  Once 02/06/23 2351 02/07/23 0450   02/06/23 2345  ceFEPIme  (MAXIPIME ) 2 g in sodium chloride  0.9 % 100 mL IVPB        2 g 200 mL/hr over 30 Minutes Intravenous  Once 02/06/23 2337 02/07/23 0030           Data Reviewed:  I have personally reviewed the following...  CBC: Recent Labs  Lab 02/06/23 2314 02/08/23 0510 02/09/23 0634  WBC 22.9* 20.1* 23.1*  NEUTROABS 20.7*  --   --   HGB 11.7* 10.8* 10.3*  HCT 33.8* 31.4* 29.6*  MCV 90.4 90.0 89.7  PLT 354 326 313   Basic Metabolic Panel: Recent Labs  Lab 02/06/23 2314 02/08/23 0510 02/09/23 0634  NA 139 142 142  K 4.0 4.0 3.7  CL 105 110 106  CO2 21* 20* 24  GLUCOSE 177* 158* 191*  BUN 31* 16 26*  CREATININE 0.95 0.64 0.82  CALCIUM 8.6* 8.8* 8.9   GFR: Estimated Creatinine Clearance: 36.8 mL/min (by C-G formula based on SCr of 0.82 mg/dL). Liver Function Tests: Recent Labs  Lab 02/06/23 2314  AST 21  ALT 20  ALKPHOS 71  BILITOT 1.1  PROT 7.3  ALBUMIN 3.7   No  results for input(s): LIPASE, AMYLASE in the last 168 hours. No results for input(s): AMMONIA in the last 168 hours. Coagulation Profile: Recent Labs  Lab 02/06/23 2314  INR 1.3*   Cardiac Enzymes: No results for input(s): CKTOTAL, CKMB, CKMBINDEX, TROPONINI in the last 168 hours. BNP (last 3 results) No results for input(s): PROBNP in the last 8760 hours. HbA1C: No results for input(s): HGBA1C in the last 72 hours. CBG: No results for input(s): GLUCAP in the last 168  hours. Lipid Profile: No results for input(s): CHOL, HDL, LDLCALC, TRIG, CHOLHDL, LDLDIRECT in the last 72 hours. Thyroid  Function Tests: Recent Labs    02/06/23 2314  TSH 3.679  FREET4 1.34*   Anemia Panel: No results for input(s): VITAMINB12, FOLATE, FERRITIN, TIBC, IRON, RETICCTPCT in the last 72 hours. Most Recent Urinalysis On File:     Component Value Date/Time   COLORURINE YELLOW (A) 02/07/2023 0654   APPEARANCEUR CLEAR (A) 02/07/2023 0654   LABSPEC >1.046 (H) 02/07/2023 0654   PHURINE 5.0 02/07/2023 0654   GLUCOSEU NEGATIVE 02/07/2023 0654   HGBUR NEGATIVE 02/07/2023 0654   BILIRUBINUR NEGATIVE 02/07/2023 0654   KETONESUR NEGATIVE 02/07/2023 0654   PROTEINUR NEGATIVE 02/07/2023 0654   NITRITE NEGATIVE 02/07/2023 0654   LEUKOCYTESUR NEGATIVE 02/07/2023 0654   Sepsis Labs: @LABRCNTIP (procalcitonin:4,lacticidven:4) Microbiology: Recent Results (from the past 240 hours)  Blood Culture (routine x 2)     Status: None (Preliminary result)   Collection Time: 02/06/23 11:14 PM   Specimen: BLOOD  Result Value Ref Range Status   Specimen Description BLOOD BLOOD RIGHT WRIST  Final   Special Requests   Final    BOTTLES DRAWN AEROBIC AND ANAEROBIC Blood Culture adequate volume   Culture   Final    NO GROWTH 3 DAYS Performed at [facility], [address], [place], [place] [address]    Report Status PENDING  Incomplete  Blood Culture (routine x 2)      Status: None (Preliminary result)   Collection Time: 02/06/23 11:14 PM   Specimen: BLOOD  Result Value Ref Range Status   Specimen Description BLOOD BLOOD LEFT FOREARM  Final   Special Requests   Final    BOTTLES DRAWN AEROBIC AND ANAEROBIC Blood Culture adequate volume   Culture   Final    NO GROWTH 3 DAYS Performed at [facility], [address], [place], [place] [address]    Report Status PENDING  Incomplete  Resp panel by RT-PCR (RSV, Flu A&B, Covid) Anterior Nasal Swab     Status: None   Collection Time: 02/06/23 11:35 PM   Specimen: Anterior Nasal Swab  Result Value Ref Range Status   SARS Coronavirus 2 by RT PCR NEGATIVE NEGATIVE Final    Comment: (NOTE) SARS-CoV-2 target nucleic acids are NOT DETECTED.  The SARS-CoV-2 RNA is generally detectable in upper respiratory specimens during the acute phase of infection. The lowest concentration of SARS-CoV-2 viral copies this assay can detect is 138 copies/mL. A negative result does not preclude SARS-Cov-2 infection and should not be used as the sole basis for treatment or other patient management decisions. A negative result may occur with  improper specimen collection/handling, submission of specimen other than nasopharyngeal swab, presence of viral mutation(s) within the areas targeted by this assay, and inadequate number of viral copies(<138 copies/mL). A negative result must be combined with clinical observations, patient history, and epidemiological information. The expected result is Negative.  Fact Sheet for Patients:  bloggercourse.com  Fact Sheet for Healthcare Providers:  seriousbroker.it  This test is no t yet approved or cleared by the [place]  FDA and  has been authorized for detection and/or diagnosis of SARS-CoV-2 by FDA under an Emergency Use Authorization (EUA). This EUA will remain  in effect (meaning this test can be used) for the  duration of the COVID-19 declaration under Section 564(b)(1) of the Act, 21 U.S.C.section 360bbb-3(b)(1), unless the authorization is terminated  or revoked sooner.       Influenza A by  PCR NEGATIVE NEGATIVE Final   Influenza B by PCR NEGATIVE NEGATIVE Final    Comment: (NOTE) The Xpert Xpress SARS-CoV-2/FLU/RSV plus assay is intended as an aid in the diagnosis of influenza from Nasopharyngeal swab specimens and should not be used as a sole basis for treatment. Nasal washings and aspirates are unacceptable for Xpert Xpress SARS-CoV-2/FLU/RSV testing.  Fact Sheet for Patients: bloggercourse.com  Fact Sheet for Healthcare Providers: seriousbroker.it  This test is not yet approved or cleared by the [place]  FDA and has been authorized for detection and/or diagnosis of SARS-CoV-2 by FDA under an Emergency Use Authorization (EUA). This EUA will remain in effect (meaning this test can be used) for the duration of the COVID-19 declaration under Section 564(b)(1) of the Act, 21 U.S.C. section 360bbb-3(b)(1), unless the authorization is terminated or revoked.     Resp Syncytial Virus by PCR NEGATIVE NEGATIVE Final    Comment: (NOTE) Fact Sheet for Patients: bloggercourse.com  Fact Sheet for Healthcare Providers: seriousbroker.it  This test is not yet approved or cleared by the [place]  FDA and has been authorized for detection and/or diagnosis of SARS-CoV-2 by FDA under an Emergency Use Authorization (EUA). This EUA will remain in effect (meaning this test can be used) for the duration of the COVID-19 declaration under Section 564(b)(1) of the Act, 21 U.S.C. section 360bbb-3(b)(1), unless the authorization is terminated or revoked.  Performed at [facility], [address] [place], [place] [address]   Respiratory (~20 pathogens) panel by PCR     Status:  Abnormal   Collection Time: 02/07/23  9:21 AM   Specimen: Nasopharyngeal Swab; Respiratory  Result Value Ref Range Status   Adenovirus NOT DETECTED NOT DETECTED Final   Coronavirus 229E NOT DETECTED NOT DETECTED Final    Comment: (NOTE) The Coronavirus on the Respiratory Panel, DOES NOT test for the novel  Coronavirus (2019 nCoV)    Coronavirus HKU1 NOT DETECTED NOT DETECTED Final   Coronavirus NL63 NOT DETECTED NOT DETECTED Final   Coronavirus OC43 NOT DETECTED NOT DETECTED Final   Metapneumovirus NOT DETECTED NOT DETECTED Final   Rhinovirus / Enterovirus DETECTED (A) NOT DETECTED Final   Influenza A NOT DETECTED NOT DETECTED Final   Influenza B NOT DETECTED NOT DETECTED Final   Parainfluenza Virus 1 NOT DETECTED NOT DETECTED Final   Parainfluenza Virus 2 NOT DETECTED NOT DETECTED Final   Parainfluenza Virus 3 NOT DETECTED NOT DETECTED Final   Parainfluenza Virus 4 NOT DETECTED NOT DETECTED Final   Respiratory Syncytial Virus NOT DETECTED NOT DETECTED Final   Bordetella pertussis NOT DETECTED NOT DETECTED Final   Bordetella Parapertussis NOT DETECTED NOT DETECTED Final   Chlamydophila pneumoniae NOT DETECTED NOT DETECTED Final   Mycoplasma pneumoniae NOT DETECTED NOT DETECTED Final    Comment: Performed at [facility], [address] [place], [place] [address]  MRSA Next Gen by PCR, Nasal     Status: None   Collection Time: 02/07/23 10:14 AM   Specimen: Nasal Mucosa; Nasal Swab  Result Value Ref Range Status   MRSA by PCR Next Gen NOT DETECTED NOT DETECTED Final    Comment: (NOTE) The GeneXpert MRSA Assay (FDA approved for NASAL specimens only), is one component of a comprehensive MRSA colonization surveillance program. It is not intended to diagnose MRSA infection nor to guide or monitor treatment for MRSA infections. Test performance is not FDA approved in patients less than 30 years old. Performed at [facility], [address]  [place], [place]  [address]       Radiology Studies last 3 days: ECHOCARDIOGRAM COMPLETE Result Date: 02/07/2023    ECHOCARDIOGRAM REPORT   Patient Name:   SAMUELLA RASOOL Date of Exam: 02/07/2023 Medical Rec #:  [id number]   Height:       62.0 in Accession #:    [id number]  Weight:       127.9 lb Date of Birth:  [birth year]   BSA:          1.581 m Patient Age:    89 years    BP:           125/67 mmHg Patient Gender: F           HR:           77 bpm. Exam Location:  [facility] Procedure: 2D Echo, Cardiac Doppler and Color Doppler Indications:     Atrial Fibrillation  History:         Patient has no prior history of Echocardiogram examinations.                  CHF, Arrythmias:Atrial Fibrillation, Signs/Symptoms:Altered                  Mental Status; Risk Factors:Hypertension.  Sonographer:     Naomie Reef Referring Phys:  [id number] DELAYNE LULLA SOLIAN Diagnosing Phys: Lonni Hanson MD  Sonographer Comments: Technically difficult study due to poor echo windows. Image acquisition challenging due to patient behavioral factors. IMPRESSIONS  1. Left ventricular ejection fraction, by estimation, is >55%. The left ventricle has normal function. Left ventricular endocardial border not optimally defined to evaluate regional wall motion. Left ventricular diastolic parameters are consistent with Grade I diastolic dysfunction (impaired relaxation).  2. Right ventricular systolic function is normal. The right ventricular size is normal.  3. Left atrial size was mildly dilated.  4. The mitral valve is normal in structure. Trivial mitral valve regurgitation. No evidence of mitral stenosis.  5. Tricuspid valve regurgitation is moderate.  6. The aortic valve was not well visualized. Aortic valve regurgitation is trivial. FINDINGS  Left Ventricle: Left ventricular ejection fraction, by estimation, is >55%. The left ventricle has normal function. Left ventricular endocardial border not optimally defined to evaluate regional wall motion. The left ventricular  internal cavity size was  normal in size. There is borderline left ventricular hypertrophy. Left ventricular diastolic parameters are consistent with Grade I diastolic dysfunction (impaired relaxation). Right Ventricle: The right ventricular size is normal. No increase in right ventricular wall thickness. Right ventricular systolic function is normal. Left Atrium: Left atrial size was mildly dilated. Right Atrium: Right atrial size was normal in size. Pericardium: The pericardium was not well visualized. Mitral Valve: The mitral valve is normal in structure. Trivial mitral valve regurgitation. No evidence of mitral valve stenosis. MV peak gradient, 5.1 mmHg. The mean mitral valve gradient is 2.0 mmHg. Tricuspid Valve: The tricuspid valve is not well visualized. Tricuspid valve regurgitation is moderate. Aortic Valve: The aortic valve was not well visualized. Aortic valve regurgitation is trivial. Aortic valve mean gradient measures 2.0 mmHg. Aortic valve peak gradient measures 5.5 mmHg. Aortic valve area, by VTI measures 2.27 cm. Pulmonic Valve: The pulmonic valve was not well visualized. Pulmonic valve regurgitation is trivial. No evidence of pulmonic stenosis. Aorta: The aortic root is normal in size and structure. Pulmonary Artery: The pulmonary artery is not well seen. Venous: The inferior vena cava was not well visualized. IAS/Shunts: The interatrial septum was not  well visualized.  LEFT VENTRICLE PLAX 2D LVIDd:         4.20 cm   Diastology LVIDs:         2.50 cm   LV e' medial:    7.72 cm/s LV PW:         0.90 cm   LV E/e' medial:  11.8 LV IVS:        1.10 cm   LV e' lateral:   8.16 cm/s LVOT diam:     2.00 cm   LV E/e' lateral: 11.2 LV SV:         57 LV SV Index:   36 LVOT Area:     3.14 cm  LEFT ATRIUM           Index        RIGHT ATRIUM           Index LA diam:      2.90 cm 1.83 cm/m   RA Area:     12.84 cm LA Vol (A2C): 37.6 ml 23.79 ml/m  RA Volume:   29.17 ml  18.45 ml/m LA Vol (A4C): 65.7 ml 41.55  ml/m  AORTIC VALVE                    PULMONIC VALVE AV Area (Vmax):    2.79 cm     PV Vmax:       0.90 m/s AV Area (Vmean):   2.77 cm     PV Peak grad:  3.2 mmHg AV Area (VTI):     2.27 cm AV Vmax:           117.00 cm/s AV Vmean:          70.200 cm/s AV VTI:            0.251 m AV Peak Grad:      5.5 mmHg AV Mean Grad:      2.0 mmHg LVOT Vmax:         104.00 cm/s LVOT Vmean:        62.000 cm/s LVOT VTI:          0.181 m LVOT/AV VTI ratio: 0.72  AORTA Ao Root diam: 2.90 cm MITRAL VALVE               TRICUSPID VALVE MV Area (PHT): 4.24 cm    TR Peak grad:   33.4 mmHg MV Area VTI:   1.60 cm    TR Vmax:        289.00 cm/s MV Peak grad:  5.1 mmHg MV Mean grad:  2.0 mmHg    SHUNTS MV Vmax:       1.13 m/s    Systemic VTI:  0.18 m MV Vmean:      66.2 cm/s   Systemic Diam: 2.00 cm MV Decel Time: 179 msec MV E velocity: 91.20 cm/s MV A velocity: 93.30 cm/s MV E/A ratio:  0.98 Lonni End MD Electronically signed by Lonni Hanson MD Signature Date/Time: 02/07/2023/2:33:21 PM    Final    DG Foot 2 Views Right Result Date: 02/07/2023 CLINICAL DATA:  Right foot and ankle pain EXAM: RIGHT FOOT - 2 VIEW; RIGHT ANKLE - 2 VIEW COMPARISON:  None Available. FINDINGS: There is no evidence of fracture or dislocation. Diffuse osteopenia. Diffuse soft tissue reticulation of the lower leg and more focal soft tissue swelling over the lateral malleolus. Vascular calcifications. IMPRESSION: 1. No acute fracture or dislocation. 2. Diffuse soft tissue reticulation of the  lower leg and more focal soft tissue swelling over the lateral malleolus. Electronically Signed   By: Limin  Xu M.D.   On: 02/07/2023 09:49   DG Ankle 2 Views Right Result Date: 02/07/2023 CLINICAL DATA:  Right foot and ankle pain EXAM: RIGHT FOOT - 2 VIEW; RIGHT ANKLE - 2 VIEW COMPARISON:  None Available. FINDINGS: There is no evidence of fracture or dislocation. Diffuse osteopenia. Diffuse soft tissue reticulation of the lower leg and more focal soft tissue  swelling over the lateral malleolus. Vascular calcifications. IMPRESSION: 1. No acute fracture or dislocation. 2. Diffuse soft tissue reticulation of the lower leg and more focal soft tissue swelling over the lateral malleolus. Electronically Signed   By: Limin  Xu M.D.   On: 02/07/2023 09:49   CT Angio Chest PE W/Cm &/Or Wo Cm Result Date: 02/07/2023 CLINICAL DATA:  Abdominal pain, bowel obstruction suspected, sepsis, tachypneic and tachycardic. Hypoxemia requiring oxygen. Bilateral knee pain and abdominal distention and pain. PE suspected. EXAM: CT ANGIOGRAPHY CHEST CT ABDOMEN AND PELVIS WITH CONTRAST TECHNIQUE: Multidetector CT imaging of the chest was performed using the standard protocol during bolus administration of intravenous contrast. Multiplanar CT image reconstructions and MIPs were obtained to evaluate the vascular anatomy. Multidetector CT imaging of the abdomen and pelvis was performed using the standard protocol during bolus administration of intravenous contrast. RADIATION DOSE REDUCTION: This exam was performed according to the departmental dose-optimization program which includes automated exposure control, adjustment of the mA and/or kV according to patient size and/or use of iterative reconstruction technique. CONTRAST:  75mL OMNIPAQUE  IOHEXOL  350 MG/ML SOLN COMPARISON:  CT pelvis 07/14/2022 and CT chest 07/15/2016 FINDINGS: CTA CHEST FINDINGS Cardiovascular: Negative for acute pulmonary embolism. No pericardial effusion. Coronary artery and aortic atherosclerotic calcification. Mediastinum/Nodes: Esophagus is unremarkable. Nodules along the posterior trachea likely represent adherent debris. No lymphadenopathy. Lungs/Pleura: Respiratory motion obscures fine detail. Interlobular septal thickening and patchy ground-glass opacities. Bronchial wall thickening. Bibasilar atelectasis or infiltrates. No pleural effusion or pneumothorax. Musculoskeletal: Chronic left scapular and rib fractures.  Subacute appearing left posterior 10th-11th rib fractures. Chronic appearing compression fractures of T7 and T8. T7 kyphoplasty. Postoperative changes at the thoracolumbar junction. Review of the MIP images confirms the above findings. CT ABDOMEN and PELVIS FINDINGS Hepatobiliary: Respiratory motion obscures detail in the abdomen. No acute hepatobiliary abnormality. Pancreas: Unremarkable. Spleen: Unremarkable. Adrenals/Urinary Tract: Unremarkable adrenal glands. Bilateral small cystic lesions statistically likely to represent benign cysts. No follow-up recommended. Geographic hypoattenuation in the cortex of the left kidney may be due to artifact from streak and motion versus pyelonephritis. No hydronephrosis. Unremarkable bladder. Stomach/Bowel: Normal caliber large and small bowel. Extensive colonic diverticulosis without evidence of diverticulitis. Stomach is within normal limits. Vascular/Lymphatic: IVC filter. Aortic atherosclerosis. No enlarged abdominal or pelvic lymph nodes. Reproductive: No acute abnormality. Other: No free intraperitoneal fluid or air. Musculoskeletal: IM rod and screw fixation right femur. Left THA. Chronic right pubic rami fractures. No acute fracture. Review of the MIP images confirms the above findings. IMPRESSION: 1. Negative for acute pulmonary embolism. 2. Interlobular septal thickening and patchy ground-glass opacities with bibasilar atelectasis or infiltrates. Findings favor atypical infection. 3. Geographic hypoattenuation in the cortex of the left kidney may be due to streak artifact and motion versus pyelonephritis. Correlate with urinalysis. 4. Subacute appearing left posterior 10th-11th rib fractures. 5. Extensive colonic diverticulosis without evidence of diverticulitis. Aortic Atherosclerosis (ICD10-I70.0). Electronically Signed   By: Norman Gatlin M.D.   On: 02/07/2023 00:47   CT ABDOMEN PELVIS W CONTRAST  Result Date: 02/07/2023 CLINICAL DATA:  Abdominal pain, bowel  obstruction suspected, sepsis, tachypneic and tachycardic. Hypoxemia requiring oxygen. Bilateral knee pain and abdominal distention and pain. PE suspected. EXAM: CT ANGIOGRAPHY CHEST CT ABDOMEN AND PELVIS WITH CONTRAST TECHNIQUE: Multidetector CT imaging of the chest was performed using the standard protocol during bolus administration of intravenous contrast. Multiplanar CT image reconstructions and MIPs were obtained to evaluate the vascular anatomy. Multidetector CT imaging of the abdomen and pelvis was performed using the standard protocol during bolus administration of intravenous contrast. RADIATION DOSE REDUCTION: This exam was performed according to the departmental dose-optimization program which includes automated exposure control, adjustment of the mA and/or kV according to patient size and/or use of iterative reconstruction technique. CONTRAST:  75mL OMNIPAQUE  IOHEXOL  350 MG/ML SOLN COMPARISON:  CT pelvis 07/14/2022 and CT chest 07/15/2016 FINDINGS: CTA CHEST FINDINGS Cardiovascular: Negative for acute pulmonary embolism. No pericardial effusion. Coronary artery and aortic atherosclerotic calcification. Mediastinum/Nodes: Esophagus is unremarkable. Nodules along the posterior trachea likely represent adherent debris. No lymphadenopathy. Lungs/Pleura: Respiratory motion obscures fine detail. Interlobular septal thickening and patchy ground-glass opacities. Bronchial wall thickening. Bibasilar atelectasis or infiltrates. No pleural effusion or pneumothorax. Musculoskeletal: Chronic left scapular and rib fractures. Subacute appearing left posterior 10th-11th rib fractures. Chronic appearing compression fractures of T7 and T8. T7 kyphoplasty. Postoperative changes at the thoracolumbar junction. Review of the MIP images confirms the above findings. CT ABDOMEN and PELVIS FINDINGS Hepatobiliary: Respiratory motion obscures detail in the abdomen. No acute hepatobiliary abnormality. Pancreas: Unremarkable.  Spleen: Unremarkable. Adrenals/Urinary Tract: Unremarkable adrenal glands. Bilateral small cystic lesions statistically likely to represent benign cysts. No follow-up recommended. Geographic hypoattenuation in the cortex of the left kidney may be due to artifact from streak and motion versus pyelonephritis. No hydronephrosis. Unremarkable bladder. Stomach/Bowel: Normal caliber large and small bowel. Extensive colonic diverticulosis without evidence of diverticulitis. Stomach is within normal limits. Vascular/Lymphatic: IVC filter. Aortic atherosclerosis. No enlarged abdominal or pelvic lymph nodes. Reproductive: No acute abnormality. Other: No free intraperitoneal fluid or air. Musculoskeletal: IM rod and screw fixation right femur. Left THA. Chronic right pubic rami fractures. No acute fracture. Review of the MIP images confirms the above findings. IMPRESSION: 1. Negative for acute pulmonary embolism. 2. Interlobular septal thickening and patchy ground-glass opacities with bibasilar atelectasis or infiltrates. Findings favor atypical infection. 3. Geographic hypoattenuation in the cortex of the left kidney may be due to streak artifact and motion versus pyelonephritis. Correlate with urinalysis. 4. Subacute appearing left posterior 10th-11th rib fractures. 5. Extensive colonic diverticulosis without evidence of diverticulitis. Aortic Atherosclerosis (ICD10-I70.0). Electronically Signed   By: Norman Gatlin M.D.   On: 02/07/2023 00:47   DG Knee 2 Views Left Result Date: 02/07/2023 CLINICAL DATA:  Recent fall with knee pain, initial encounter EXAM: LEFT KNEE - 2 VIEW COMPARISON:  None Available. FINDINGS: Healed proximal fibular fracture is noted. No acute fracture or dislocation is noted. No soft tissue abnormality is seen. No joint effusion is noted. IMPRESSION: Healed proximal fibular fracture.  No acute abnormality noted. Electronically Signed   By: Oneil Devonshire M.D.   On: 02/07/2023 00:31   DG Knee 2 Views  Right Result Date: 02/07/2023 CLINICAL DATA:  Fall, pain EXAM: RIGHT KNEE - 1-2 VIEW COMPARISON:  None Available. FINDINGS: Chondrocalcinosis and mild degenerative changes throughout the right knee. No acute bony abnormality. Specifically, no fracture, subluxation, or dislocation. No joint effusion. Vascular calcifications noted. IMPRESSION: No acute bony abnormality. Electronically Signed   By: Franky Crease M.D.  On: 02/07/2023 00:31   DG Chest Port 1 View Result Date: 02/06/2023 CLINICAL DATA:  Questionable sepsis - evaluate for abnormality. Low O2 sats. EXAM: PORTABLE CHEST 1 VIEW COMPARISON:  07/14/2022 FINDINGS: Heart is borderline in size. Mediastinal contours within normal limits. Scattered aortic atherosclerosis. Questionable patchy opacity in the right upper lobe. No confluent opacity on the left. No effusions. No acute bony abnormality. Old healed left rib fractures. IMPRESSION: Questionable patchy right upper lobe opacity/infiltrate. Borderline heart size Electronically Signed   By: Franky Crease M.D.   On: 02/06/2023 23:28          Laneta Blunt, DO [facility] 02/09/2023, 5:42 PM    Dictation software may have been used to generate the above note. Typos may occur and escape review in typed/dictated notes. Please contact Dr Blunt directly for clarity if needed.  Staff may message me via secure chat in Epic  but this may not receive an immediate response,  please page me for urgent matters!  If 7PM-7AM, please contact night coverage [email or web address]

## 2023-02-09 NOTE — Progress Notes (Signed)
 ANTICOAGULATION CONSULT NOTE  Pharmacy Consult for heparin  infusion Indication: atrial fibrillation  Allergies  Allergen Reactions   Atorvastatin Cough   Patient Measurements: Height: 5' 2 (157.5 cm) Weight: 58 kg (127 lb 13.9 oz) IBW/kg (Calculated) : 50.1 Heparin  Dosing Weight: 58 kg  Vital Signs: Temp: 98.4 F (36.9 C) (01/09 0404) Temp Source: Axillary (01/09 0357) BP: 142/91 (01/09 0404) Pulse Rate: 102 (01/09 0404)  Labs: Recent Labs    02/06/23 2314 02/07/23 0154 02/07/23 0740 02/08/23 0510 02/08/23 1104 02/08/23 2138 02/09/23 0634  HGB 11.7*  --   --  10.8*  --   --  10.3*  HCT 33.8*  --   --  31.4*  --   --  29.6*  PLT 354  --   --  326  --   --  313  APTT 32  --   --   --   --   --   --   LABPROT 15.9*  --   --   --   --   --   --   INR 1.3*  --   --   --   --   --   --   HEPARINUNFRC  --   --    < >  --  0.21* 0.17* 0.53  CREATININE 0.95  --   --  0.64  --   --  0.82  TROPONINIHS 21* 37*  --   --   --   --   --    < > = values in this interval not displayed.   Estimated Creatinine Clearance: 36.8 mL/min (by C-G formula based on SCr of 0.82 mg/dL).  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 In  the ED they were noted to have atrial fibrillation with rapid ventricular response, currently in sinus rhythm. Pharmacy has been consulted for initiation and management of heparin  infusion for atrial fibrillation.   Goal of Therapy:  Heparin  level 0.3-0.7 units/ml Monitor platelets by anticoagulation protocol: Yes  Heparin  Levels Date/Time HL Clinical Assessment  1/7@0740   0.33 Therapeutic x 1  1/7@1501   0.75 SUPRAtherapeutic   1/8 @ 0228 0.30 Therapeutic x 1  1/8 @ 1104 0.21 Subtherapeutic   1/8 @ 2138 0.17 Subtherapeutic  1/9 @ 0634 0.53 Therapeutic x 1    No issues with infusion reported, no signs/symptoms of bleeding noted.  Plan:  HL therapeutic x 1  Continue heparin  infusion at current rate of 1050 units/hr  Check next HL in 8 hours at 1430 to confirm rate  CBC stable; monitor daily and for s/sx of bleeding   Thank you for involving pharmacy in this patient's care.  Evonnie Nieves, PharmD Pharmacy Resident  02/09/2023 7:41 AM

## 2023-02-09 NOTE — Progress Notes (Signed)
 PHARMACIST - PHYSICIAN COMMUNICATION DR:  Marsa CONCERNING: Antibiotic IV to Oral Route Change Policy  RECOMMENDATION: This patient is receiving azithromycin  by the intravenous route. Based on criteria approved by the Pharmacy and Therapeutics Committee, the antibiotic(s) is/are being converted to the equivalent oral dose form(s).  DESCRIPTION: These criteria include: Patient being treated for a respiratory tract infection, urinary tract infection, cellulitis or clostridium difficile associated diarrhea if on metronidazole The patient is not neutropenic and does not exhibit a GI malabsorption state The patient is eating (either orally or via tube) and/or has been taking other orally administered medications for a least 24 hours The patient is improving clinically and has a Tmax < 100.5  If you have questions about this conversion, please contact the Pharmacy Department.   Evonnie Nieves, PharmD Pharmacy Resident  02/09/2023 2:29 PM

## 2023-02-09 NOTE — Plan of Care (Signed)
   Problem: Fluid Volume: Goal: Hemodynamic stability will improve Outcome: Progressing   Problem: Clinical Measurements: Goal: Diagnostic test results will improve Outcome: Progressing Goal: Signs and symptoms of infection will decrease Outcome: Progressing   Problem: Respiratory: Goal: Ability to maintain adequate ventilation will improve Outcome: Progressing

## 2023-02-10 DIAGNOSIS — J189 Pneumonia, unspecified organism: Secondary | ICD-10-CM | POA: Diagnosis not present

## 2023-02-10 DIAGNOSIS — F03B18 Unspecified dementia, moderate, with other behavioral disturbance: Secondary | ICD-10-CM

## 2023-02-10 LAB — CBC
HCT: 30.8 % — ABNORMAL LOW (ref 36.0–46.0)
Hemoglobin: 10.8 g/dL — ABNORMAL LOW (ref 12.0–15.0)
MCH: 31 pg (ref 26.0–34.0)
MCHC: 35.1 g/dL (ref 30.0–36.0)
MCV: 88.5 fL (ref 80.0–100.0)
Platelets: 334 10*3/uL (ref 150–400)
RBC: 3.48 MIL/uL — ABNORMAL LOW (ref 3.87–5.11)
RDW: 15.1 % (ref 11.5–15.5)
WBC: 21.2 10*3/uL — ABNORMAL HIGH (ref 4.0–10.5)
nRBC: 0 % (ref 0.0–0.2)

## 2023-02-10 LAB — BASIC METABOLIC PANEL
Anion gap: 12 (ref 5–15)
BUN: 30 mg/dL — ABNORMAL HIGH (ref 8–23)
CO2: 27 mmol/L (ref 22–32)
Calcium: 9 mg/dL (ref 8.9–10.3)
Chloride: 105 mmol/L (ref 98–111)
Creatinine, Ser: 0.9 mg/dL (ref 0.44–1.00)
GFR, Estimated: >60 mL/min (ref >60)
Glucose, Bld: 187 mg/dL — ABNORMAL HIGH (ref 70–99)
Potassium: 3.3 mmol/L — ABNORMAL LOW (ref 3.5–5.1)
Sodium: 144 mmol/L (ref 135–145)

## 2023-02-10 LAB — HEPARIN LEVEL (UNFRACTIONATED): Heparin Unfractionated: 0.45 [IU]/mL (ref 0.30–0.70)

## 2023-02-10 MED ORDER — DEXTROSE-SODIUM CHLORIDE 5-0.9 % IV SOLN: INTRAVENOUS | Status: DC

## 2023-02-10 NOTE — Plan of Care (Signed)
  Problem: Fluid Volume: Goal: Hemodynamic stability will improve Outcome: Not Progressing   Problem: Respiratory: Goal: Ability to maintain adequate ventilation will improve Outcome: Not Progressing

## 2023-02-10 NOTE — Progress Notes (Signed)
 ANTICOAGULATION CONSULT NOTE  Pharmacy Consult for heparin  infusion Indication: atrial fibrillation  Allergies  Allergen Reactions   Atorvastatin Cough   Patient Measurements: Height: 5' 2 (157.5 cm) Weight: 58 kg (127 lb 13.9 oz) IBW/kg (Calculated) : 50.1 Heparin  Dosing Weight: 58 kg  Vital Signs: Temp: 98.3 F (36.8 C) (01/10 0417) Temp Source: Axillary (01/10 0417) BP: 159/85 (01/10 0417) Pulse Rate: 88 (01/10 0417)  Labs: Recent Labs    02/08/23 0510 02/08/23 1104 02/09/23 0634 02/09/23 1418 02/10/23 0334  HGB 10.8*  --  10.3*  --  10.8*  HCT 31.4*  --  29.6*  --  30.8*  PLT 326  --  313  --  334  HEPARINUNFRC  --    < > 0.53 0.48 0.45  CREATININE 0.64  --  0.82  --  0.90   < > = values in this interval not displayed.   Estimated Creatinine Clearance: 33.5 mL/min (by C-G formula based on SCr of 0.9 mg/dL).  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 In the ED they were noted to have atrial fibrillation with rapid ventricular response, currently in sinus rhythm. Pharmacy has been consulted for initiation and management of heparin  infusion for atrial fibrillation.   Goal of Therapy:  Heparin  level 0.3-0.7  units/ml Monitor platelets by anticoagulation protocol: Yes  Heparin  Levels Date/Time HL Clinical Assessment  1/7@0740   0.33 Therapeutic x 1  1/7@1501   0.75 SUPRAtherapeutic   1/8 @ 0228 0.30 Therapeutic x 1  1/8 @ 1104 0.21 Subtherapeutic   1/8 @ 2138 0.17 Subtherapeutic  1/9 @ 0634 0.53 Therapeutic x 1   1/9 @ 1418 0.48 Therapeutic x 2   1/10 0334 0.45 Therapeutic x 3   Plan:  HL therapeutic x 3  Continue heparin  infusion at current rate of 1050 units/hr  Check next HL tomorrow morning with AM labs  CBC stable; monitor daily and for s/sx of bleeding   Thank you for involving pharmacy in this patient's care.   Rankin CANDIE Dills, PharmD, [facility] 02/10/2023 6:04 AM

## 2023-02-10 NOTE — Progress Notes (Signed)
 Speech Language Pathology Treatment: Dysphagia  Patient Details Name: Lisa Martin MRN: [id number] DOB: Nov 03, 1931 Today's Date: 02/10/2023 Time: 8959-8879 SLP Time Calculation (min) (ACUTE ONLY): 40 min  Assessment / Plan / Recommendation Clinical Impression  Pt seen for ongoing assessment of swallowing; toleration of diet. NSG and MD(initially) present in room. Pt w/ Eyes Closed during session and No verbal responses as she did yesterday(when given MOD stim). Much less alert and engaged. Baseline Dementia.  On [place] O2 4L; afebrile. O2 sats in low 90s. WBC elevated as at eval. Min pulmonary effort at rest.   Attempted to awaken and alert pt w/ MOD-MAX verbal/tactile cues; po's(nectar and puree) placed at/on lips for stimulation. Pt did NOT respond to stimulation despite cues by this SLP and NSG. Continued po trials were stopped and nothing further attempted d/t Cognitive/State presentation and HIGH risk for aspiration.  Unable to make any assessment of pt's oropharyngeal phase of swallowing today. MD present. This was discussed w/ both MD and NSG.   Due to pt's declined engagement and attention during po tasks, as well as her declined Pulmonary status and Cognitive decline baseline, her risk for aspiration/aspiration pneumonia is HIGH w/ any oral intake.  Recommend continuing Nectar liquids in diet w/ a more Pureed diet consistency w/ STRICT aspiration precautions and offering po's ONLY WHEN pt is fully alert and awake to engage safely in oral intake. Recommended giving attempts at each meal/med pass but STOP if pt is not appropriate at the time. MD/NSG agreed. Pills CRUSHED in Puree; 100% Supervision and Feeding assistance at meals = only given po's when fully alert and engaged in task.   ST services will continue to monitor status and presentation during admit next 2-3 days. Discussed w/ MD post session; recommend Palliative Care discussion of GOC. Suspect pt's Cognitive decline and Baseline  Dementia will impact swallowing and overall intake increasing risk for aspiration/aspiration pneumonia and hamper upgrade of diet consistency. MD to discuss GOC and pt's presentation w/ Family today.      HPI HPI: Pt is an 89 year old woman admitted with concerns for pneumonia and sepsis complicated by paroxysmal atrial fibrillation with rapid ventricular response. She presented w/ tachypnea and hypoxia. PMH includes: HTN, dCHF, hypothyroidism, depression with anxiety, GERD, breast cancer (s/p of radiation and chemotherapy), protein calorie malnutrition, prior falls with resulting hip fracture, rib fractures, with most recent fall 07/2022 with pelvic fracture treated nonoperatively, resides at a memory care unit.   CT of Chest:  No PE.  Interlobular septal thickening and patchy ground-glass opacities  with bibasilar atelectasis or infiltrates. Findings favor atypical  infection.  3. Geographic hypoattenuation in the cortex of the left kidney may  be due to streak artifact and motion versus pyelonephritis.  Correlate with urinalysis.  4. Subacute appearing left posterior 10th-11th rib fractures.  5. Extensive colonic diverticulosis without evidence of  diverticulitis.      SLP Plan  Continue with current plan of care      Recommendations for follow up therapy are one component of a multi-disciplinary discharge planning process, led by the attending physician.  Recommendations may be updated based on patient status, additional functional criteria and insurance authorization.    Recommendations  Diet recommendations: Dysphagia 1 (puree);Nectar-thick liquid (when alert for oral intake) Liquids provided via: Teaspoon;Cup Medication Administration: Crushed with puree Supervision: Staff to assist with self feeding;Full supervision/cueing for compensatory strategies Compensations: Minimize environmental distractions;Slow rate;Small sips/bites;Follow solids with liquid Postural Changes and/or Swallow  Maneuvers: Out of bed for  meals;Seated upright 90 degrees;Upright 30-60 min after meal                 (Palliative Care for GOC) Oral care BID;Oral care before and after PO;Staff/trained caregiver to provide oral care   Frequent or constant Supervision/Assistance Dysphagia, oropharyngeal phase (R13.12) (in setting of acute illness and Dementia/confusion)     Continue with current plan of care       Comer Portugal, MS, CCC-SLP Speech Language Pathologist Rehab Services; [facility] [place] [phone] (ascom) Alexiss Iturralde  02/10/2023, 2:19 PM

## 2023-02-10 NOTE — IPAL (Signed)
  Interdisciplinary Goals of Care Family Meeting   Date carried out: 02/10/2023  Location of the meeting: Bedside  Member's involved: Physician and Family Member or next of kin  Durable Power of Attorney or acting medical decision maker: daughter Lisa Martin    Discussion: We discussed goals of care for Lisa Martin .    Discussed best case / optimal scenario - pt returns more or less to baseline, which would require her perking up a bit and eating, becoming more alert. I am not optimistic for this outcome given her baseline weakness and poor reserve, her acute illness, and dementia w/ other comorbidities.   Discussed less optimal scenario - pt continues to not eat, becomes more somnolent, slowly deteriorates due to effects of starvation/debility, or she is also at high risk of sudden decompensation such as aspiration or cardiac arrest. Affirmed decision for DNR/DNI.   Discussed options for treatment - daughter requests trial dextrose  IV fluids, states her mom wound not want feeding tubes (and at any rate I advised these would not be likely to help in her case anyway). Lisa is accepting that her mom is not likely to improve to baseline, and is open to pursuing hospice services if her mom isn't showing substantial improvement over the weekend.   For now, continue current level of care. If not improving, if deteriorating further or if acute decompensation, would transition to comfort measures only.   Code status:   Code Status: Limited: Do not attempt resuscitation (DNR) -DNR-LIMITED -Do Not Intubate/DNI    Disposition: Continue current acute care  Time spent for the meeting: 30 min     Laneta Blunt, DO  02/10/2023, 6:07 PM

## 2023-02-10 NOTE — Progress Notes (Signed)
 PROGRESS NOTE    Lisa Martin   FMW:[id number] DOB: [birth year]  DOA: 02/06/2023 Date of Service: 02/10/23 which is hospital day 3  PCP: Cleotilde Oneil FALCON, MD    Hospital course / significant events:   HPI: Lisa Martin is a 89 y.o. female with medical history significant for advanced dementia,  HTN, dCHF, hypothyroidism, depression with anxiety, GERD, breast cancer (s/p of radiation and chemotherapy), protein calorie malnutrition, prior falls with resulting hip fracture, rib fractures, with most recent fall 07/2022 with pelvic fracture treated nonoperatively, who presents from memory care unit with concerns for tachycardia and tachypnea with hypoxia to 89% requiring 6 L by EMS to maintain sats in the mid 90s.  She was also tachycardic to 170.  Patient unable to contribute to history due to dementia.   01/06: admitted to hospitalist service for sepsis d/t pneumonia, (+)Rhinovirus, AfibRVR 01/07: converted to sinus rhythm. Echo w/ preserved EF Grade 1 diast df  01/08: titrated po diltiazem  ER 120 mg daily. Tachypneic, not tolerating BiPap 01/09-01/10: sleepy but easily awoken, confused/disoriented, tachypneic but not in serious distress, not eating. Consider for palliative if not improving. Not responding to cues for po intake.      Consultants:  Cardiology   Procedures/Surgeries: none      ASSESSMENT & PLAN:   CAP Rhinovirus Sepsis d/t pneumonia  continue ceftriaxone /azithromycin  will also trial some ipratropium breathing treatments. Holding on albuterol  given a-fib   Acute hypoxic respiratory failure 2/2 pneumonia.  No CO2 retention on most recent VBG continue [place] O2 trial of bipap   A-fib with rvr Appears to be a new dx. Hr improved continue heparin  TTE pending cardiology following   Right foot pain No erythema. X-rays no fracture or other acute process monitor   Hypothyroid Tsh wnl cont home levothyroxine    Advanced alzheimer's dementia cont home memantine ,  seroquel , rivastigmine    MDD home sertraline      DVT prophylaxis: heparin  IV fluids: no continuous IV fluids  Nutrition: dysphagia, low PO intake  Central lines / invasive devices: none  Code Status: DNR ACP documentation reviewed:  none on file in [facility]  TOC needs: TBD Barriers to dispo / significant pending items: clinical illness              Subjective / Brief ROS:  Patient sleepy today, still wakens to voice but not really answering questions with any coherence.    Family Communication: discussed w/ daughter at bedside this evening     Objective Findings:  Vitals:   02/10/23 0600 02/10/23 0738 02/10/23 0859 02/10/23 1201  BP: (!) 142/69  (!) 157/74 (!) 157/85  Pulse: 93  86   Resp: (!) 23  (!) 22   Temp:   98.8 F (37.1 C) 97.6 F (36.4 C)  TempSrc:   Axillary Axillary  SpO2: 94% 90%    Weight:      Height:        Intake/Output Summary (Last 24 hours) at 02/10/2023 1714 Last data filed at 02/10/2023 0600 Gross per 24 hour  Intake 150.71 ml  Output 950 ml  Net -799.29 ml   Filed Weights   02/06/23 2311  Weight: 58 kg    Examination:  Physical Exam Constitutional:      General: She is not in acute distress. Pulmonary:     Effort: Tachypnea present. No respiratory distress.     Breath sounds: Decreased breath sounds and wheezing present.  Musculoskeletal:     Right lower leg: No edema.  Left lower leg: No edema.  Skin:    General: Skin is warm and dry.  Neurological:     Mental Status: She is disoriented.  Psychiatric:        Behavior: Behavior normal.          Scheduled Medications:   azithromycin   500 mg Oral Daily   diltiazem   60 mg Oral Q12H   levothyroxine   125 mcg Oral Q0600   memantine   5 mg Oral BID   sertraline   25 mg Oral Daily    Continuous Infusions:  cefTRIAXone  (ROCEPHIN )  IV 2 g (02/10/23 1236)   heparin  1,050 Units/hr (02/10/23 1033)    PRN Medications:  acetaminophen  **OR** acetaminophen ,  albuterol , haloperidol  lactate, ondansetron  **OR** ondansetron  (ZOFRAN ) IV  Antimicrobials from admission:  Anti-infectives (From admission, onward)    Start     Dose/Rate Route Frequency Ordered Stop   02/10/23 0600  azithromycin  (ZITHROMAX ) tablet 500 mg        500 mg Oral Daily 02/09/23 1429 02/12/23 0559   02/07/23 1200  cefTRIAXone  (ROCEPHIN ) 2 g in sodium chloride  0.9 % 100 mL IVPB        2 g 200 mL/hr over 30 Minutes Intravenous Every 24 hours 02/07/23 0147 02/12/23 1159   02/07/23 0600  azithromycin  (ZITHROMAX ) 500 mg in sodium chloride  0.9 % 250 mL IVPB  Status:  Discontinued        500 mg 250 mL/hr over 60 Minutes Intravenous Every 24 hours 02/07/23 0147 02/09/23 1429   02/07/23 0000  vancomycin  (VANCOREADY) IVPB 1250 mg/250 mL        1,250 mg 166.7 mL/hr over 90 Minutes Intravenous  Once 02/06/23 2351 02/07/23 0450   02/06/23 2345  ceFEPIme  (MAXIPIME ) 2 g in sodium chloride  0.9 % 100 mL IVPB        2 g 200 mL/hr over 30 Minutes Intravenous  Once 02/06/23 2337 02/07/23 0030           Data Reviewed:  I have personally reviewed the following...  CBC: Recent Labs  Lab 02/06/23 2314 02/08/23 0510 02/09/23 0634 02/10/23 0334  WBC 22.9* 20.1* 23.1* 21.2*  NEUTROABS 20.7*  --   --   --   HGB 11.7* 10.8* 10.3* 10.8*  HCT 33.8* 31.4* 29.6* 30.8*  MCV 90.4 90.0 89.7 88.5  PLT 354 326 313 334   Basic Metabolic Panel: Recent Labs  Lab 02/06/23 2314 02/08/23 0510 02/09/23 0634 02/10/23 0334  NA 139 142 142 144  K 4.0 4.0 3.7 3.3*  CL 105 110 106 105  CO2 21* 20* 24 27  GLUCOSE 177* 158* 191* 187*  BUN 31* 16 26* 30*  CREATININE 0.95 0.64 0.82 0.90  CALCIUM 8.6* 8.8* 8.9 9.0   GFR: Estimated Creatinine Clearance: 33.5 mL/min (by C-G formula based on SCr of 0.9 mg/dL). Liver Function Tests: Recent Labs  Lab 02/06/23 2314  AST 21  ALT 20  ALKPHOS 71  BILITOT 1.1  PROT 7.3  ALBUMIN 3.7   No results for input(s): LIPASE, AMYLASE in the last 168  hours. No results for input(s): AMMONIA in the last 168 hours. Coagulation Profile: Recent Labs  Lab 02/06/23 2314  INR 1.3*   Cardiac Enzymes: No results for input(s): CKTOTAL, CKMB, CKMBINDEX, TROPONINI in the last 168 hours. BNP (last 3 results) No results for input(s): PROBNP in the last 8760 hours. HbA1C: No results for input(s): HGBA1C in the last 72 hours. CBG: No results for input(s): GLUCAP in the last 168 hours.  Lipid Profile: No results for input(s): CHOL, HDL, LDLCALC, TRIG, CHOLHDL, LDLDIRECT in the last 72 hours. Thyroid  Function Tests: No results for input(s): TSH, T4TOTAL, FREET4, T3FREE, THYROIDAB in the last 72 hours.  Anemia Panel: No results for input(s): VITAMINB12, FOLATE, FERRITIN, TIBC, IRON, RETICCTPCT in the last 72 hours. Most Recent Urinalysis On File:     Component Value Date/Time   COLORURINE YELLOW (A) 02/07/2023 0654   APPEARANCEUR CLEAR (A) 02/07/2023 0654   LABSPEC >1.046 (H) 02/07/2023 0654   PHURINE 5.0 02/07/2023 0654   GLUCOSEU NEGATIVE 02/07/2023 0654   HGBUR NEGATIVE 02/07/2023 0654   BILIRUBINUR NEGATIVE 02/07/2023 0654   KETONESUR NEGATIVE 02/07/2023 0654   PROTEINUR NEGATIVE 02/07/2023 0654   NITRITE NEGATIVE 02/07/2023 0654   LEUKOCYTESUR NEGATIVE 02/07/2023 0654   Sepsis Labs: @LABRCNTIP (procalcitonin:4,lacticidven:4) Microbiology: Recent Results (from the past 240 hours)  Blood Culture (routine x 2)     Status: None (Preliminary result)   Collection Time: 02/06/23 11:14 PM   Specimen: BLOOD  Result Value Ref Range Status   Specimen Description BLOOD BLOOD RIGHT WRIST  Final   Special Requests   Final    BOTTLES DRAWN AEROBIC AND ANAEROBIC Blood Culture adequate volume   Culture   Final    NO GROWTH 4 DAYS Performed at [facility], [address] [place], [place] [address]    Report Status PENDING  Incomplete  Blood Culture (routine x 2)     Status: None  (Preliminary result)   Collection Time: 02/06/23 11:14 PM   Specimen: BLOOD  Result Value Ref Range Status   Specimen Description BLOOD BLOOD LEFT FOREARM  Final   Special Requests   Final    BOTTLES DRAWN AEROBIC AND ANAEROBIC Blood Culture adequate volume   Culture   Final    NO GROWTH 4 DAYS Performed at [facility], [address] [place], [place] [address]    Report Status PENDING  Incomplete  Resp panel by RT-PCR (RSV, Flu A&B, Covid) Anterior Nasal Swab     Status: None   Collection Time: 02/06/23 11:35 PM   Specimen: Anterior Nasal Swab  Result Value Ref Range Status   SARS Coronavirus 2 by RT PCR NEGATIVE NEGATIVE Final    Comment: (NOTE) SARS-CoV-2 target nucleic acids are NOT DETECTED.  The SARS-CoV-2 RNA is generally detectable in upper respiratory specimens during the acute phase of infection. The lowest concentration of SARS-CoV-2 viral copies this assay can detect is 138 copies/mL. A negative result does not preclude SARS-Cov-2 infection and should not be used as the sole basis for treatment or other patient management decisions. A negative result may occur with  improper specimen collection/handling, submission of specimen other than nasopharyngeal swab, presence of viral mutation(s) within the areas targeted by this assay, and inadequate number of viral copies(<138 copies/mL). A negative result must be combined with clinical observations, patient history, and epidemiological information. The expected result is Negative.  Fact Sheet for Patients:  bloggercourse.com  Fact Sheet for Healthcare Providers:  seriousbroker.it  This test is no t yet approved or cleared by the [place]  FDA and  has been authorized for detection and/or diagnosis of SARS-CoV-2 by FDA under an Emergency Use Authorization (EUA). This EUA will remain  in effect (meaning this test can be used) for the duration of  the COVID-19 declaration under Section 564(b)(1) of the Act, 21 U.S.C.section 360bbb-3(b)(1), unless the authorization is terminated  or revoked sooner.       Influenza A by PCR  NEGATIVE NEGATIVE Final   Influenza B by PCR NEGATIVE NEGATIVE Final    Comment: (NOTE) The Xpert Xpress SARS-CoV-2/FLU/RSV plus assay is intended as an aid in the diagnosis of influenza from Nasopharyngeal swab specimens and should not be used as a sole basis for treatment. Nasal washings and aspirates are unacceptable for Xpert Xpress SARS-CoV-2/FLU/RSV testing.  Fact Sheet for Patients: bloggercourse.com  Fact Sheet for Healthcare Providers: seriousbroker.it  This test is not yet approved or cleared by the [place]  FDA and has been authorized for detection and/or diagnosis of SARS-CoV-2 by FDA under an Emergency Use Authorization (EUA). This EUA will remain in effect (meaning this test can be used) for the duration of the COVID-19 declaration under Section 564(b)(1) of the Act, 21 U.S.C. section 360bbb-3(b)(1), unless the authorization is terminated or revoked.     Resp Syncytial Virus by PCR NEGATIVE NEGATIVE Final    Comment: (NOTE) Fact Sheet for Patients: bloggercourse.com  Fact Sheet for Healthcare Providers: seriousbroker.it  This test is not yet approved or cleared by the [place]  FDA and has been authorized for detection and/or diagnosis of SARS-CoV-2 by FDA under an Emergency Use Authorization (EUA). This EUA will remain in effect (meaning this test can be used) for the duration of the COVID-19 declaration under Section 564(b)(1) of the Act, 21 U.S.C. section 360bbb-3(b)(1), unless the authorization is terminated or revoked.  Performed at [facility], [address] [place], [place] [address]   Respiratory (~20 pathogens) panel by PCR     Status: Abnormal    Collection Time: 02/07/23  9:21 AM   Specimen: Nasopharyngeal Swab; Respiratory  Result Value Ref Range Status   Adenovirus NOT DETECTED NOT DETECTED Final   Coronavirus 229E NOT DETECTED NOT DETECTED Final    Comment: (NOTE) The Coronavirus on the Respiratory Panel, DOES NOT test for the novel  Coronavirus (2019 nCoV)    Coronavirus HKU1 NOT DETECTED NOT DETECTED Final   Coronavirus NL63 NOT DETECTED NOT DETECTED Final   Coronavirus OC43 NOT DETECTED NOT DETECTED Final   Metapneumovirus NOT DETECTED NOT DETECTED Final   Rhinovirus / Enterovirus DETECTED (A) NOT DETECTED Final   Influenza A NOT DETECTED NOT DETECTED Final   Influenza B NOT DETECTED NOT DETECTED Final   Parainfluenza Virus 1 NOT DETECTED NOT DETECTED Final   Parainfluenza Virus 2 NOT DETECTED NOT DETECTED Final   Parainfluenza Virus 3 NOT DETECTED NOT DETECTED Final   Parainfluenza Virus 4 NOT DETECTED NOT DETECTED Final   Respiratory Syncytial Virus NOT DETECTED NOT DETECTED Final   Bordetella pertussis NOT DETECTED NOT DETECTED Final   Bordetella Parapertussis NOT DETECTED NOT DETECTED Final   Chlamydophila pneumoniae NOT DETECTED NOT DETECTED Final   Mycoplasma pneumoniae NOT DETECTED NOT DETECTED Final    Comment: Performed at [facility], [address], [place], [place] [address]  MRSA Next Gen by PCR, Nasal     Status: None   Collection Time: 02/07/23 10:14 AM   Specimen: Nasal Mucosa; Nasal Swab  Result Value Ref Range Status   MRSA by PCR Next Gen NOT DETECTED NOT DETECTED Final    Comment: (NOTE) The GeneXpert MRSA Assay (FDA approved for NASAL specimens only), is one component of a comprehensive MRSA colonization surveillance program. It is not intended to diagnose MRSA infection nor to guide or monitor treatment for MRSA infections. Test performance is not FDA approved in patients less than 40 years old. Performed at [facility], [address] [place],  [place] [address]        Radiology Studies last 3 days: ECHOCARDIOGRAM COMPLETE Result Date: 02/07/2023    ECHOCARDIOGRAM REPORT   Patient Name:   STEPHANIE LITTMAN Date of Exam: 02/07/2023 Medical Rec #:  [id number]   Height:       62.0 in Accession #:    [id number]  Weight:       127.9 lb Date of Birth:  1931/02/01   BSA:          1.581 m Patient Age:    89 years    BP:           125/67 mmHg Patient Gender: F           HR:           77 bpm. Exam Location:  [facility] Procedure: 2D Echo, Cardiac Doppler and Color Doppler Indications:     Atrial Fibrillation  History:         Patient has no prior history of Echocardiogram examinations.                  CHF, Arrythmias:Atrial Fibrillation, Signs/Symptoms:Altered                  Mental Status; Risk Factors:Hypertension.  Sonographer:     Naomie Reef Referring Phys:  [id number] DELAYNE LULLA SOLIAN Diagnosing Phys: Lonni Hanson MD  Sonographer Comments: Technically difficult study due to poor echo windows. Image acquisition challenging due to patient behavioral factors. IMPRESSIONS  1. Left ventricular ejection fraction, by estimation, is >55%. The left ventricle has normal function. Left ventricular endocardial border not optimally defined to evaluate regional wall motion. Left ventricular diastolic parameters are consistent with Grade I diastolic dysfunction (impaired relaxation).  2. Right ventricular systolic function is normal. The right ventricular size is normal.  3. Left atrial size was mildly dilated.  4. The mitral valve is normal in structure. Trivial mitral valve regurgitation. No evidence of mitral stenosis.  5. Tricuspid valve regurgitation is moderate.  6. The aortic valve was not well visualized. Aortic valve regurgitation is trivial. FINDINGS  Left Ventricle: Left ventricular ejection fraction, by estimation, is >55%. The left ventricle has normal function. Left ventricular endocardial border not optimally defined to evaluate regional wall motion. The left ventricular internal cavity  size was  normal in size. There is borderline left ventricular hypertrophy. Left ventricular diastolic parameters are consistent with Grade I diastolic dysfunction (impaired relaxation). Right Ventricle: The right ventricular size is normal. No increase in right ventricular wall thickness. Right ventricular systolic function is normal. Left Atrium: Left atrial size was mildly dilated. Right Atrium: Right atrial size was normal in size. Pericardium: The pericardium was not well visualized. Mitral Valve: The mitral valve is normal in structure. Trivial mitral valve regurgitation. No evidence of mitral valve stenosis. MV peak gradient, 5.1 mmHg. The mean mitral valve gradient is 2.0 mmHg. Tricuspid Valve: The tricuspid valve is not well visualized. Tricuspid valve regurgitation is moderate. Aortic Valve: The aortic valve was not well visualized. Aortic valve regurgitation is trivial. Aortic valve mean gradient measures 2.0 mmHg. Aortic valve peak gradient measures 5.5 mmHg. Aortic valve area, by VTI measures 2.27 cm. Pulmonic Valve: The pulmonic valve was not well visualized. Pulmonic valve regurgitation is trivial. No evidence of pulmonic stenosis. Aorta: The aortic root is normal in size and structure. Pulmonary Artery: The pulmonary artery is not well seen. Venous: The inferior vena cava was not well visualized. IAS/Shunts: The interatrial septum was not well  visualized.  LEFT VENTRICLE PLAX 2D LVIDd:         4.20 cm   Diastology LVIDs:         2.50 cm   LV e' medial:    7.72 cm/s LV PW:         0.90 cm   LV E/e' medial:  11.8 LV IVS:        1.10 cm   LV e' lateral:   8.16 cm/s LVOT diam:     2.00 cm   LV E/e' lateral: 11.2 LV SV:         57 LV SV Index:   36 LVOT Area:     3.14 cm  LEFT ATRIUM           Index        RIGHT ATRIUM           Index LA diam:      2.90 cm 1.83 cm/m   RA Area:     12.84 cm LA Vol (A2C): 37.6 ml 23.79 ml/m  RA Volume:   29.17 ml  18.45 ml/m LA Vol (A4C): 65.7 ml 41.55 ml/m  AORTIC  VALVE                    PULMONIC VALVE AV Area (Vmax):    2.79 cm     PV Vmax:       0.90 m/s AV Area (Vmean):   2.77 cm     PV Peak grad:  3.2 mmHg AV Area (VTI):     2.27 cm AV Vmax:           117.00 cm/s AV Vmean:          70.200 cm/s AV VTI:            0.251 m AV Peak Grad:      5.5 mmHg AV Mean Grad:      2.0 mmHg LVOT Vmax:         104.00 cm/s LVOT Vmean:        62.000 cm/s LVOT VTI:          0.181 m LVOT/AV VTI ratio: 0.72  AORTA Ao Root diam: 2.90 cm MITRAL VALVE               TRICUSPID VALVE MV Area (PHT): 4.24 cm    TR Peak grad:   33.4 mmHg MV Area VTI:   1.60 cm    TR Vmax:        289.00 cm/s MV Peak grad:  5.1 mmHg MV Mean grad:  2.0 mmHg    SHUNTS MV Vmax:       1.13 m/s    Systemic VTI:  0.18 m MV Vmean:      66.2 cm/s   Systemic Diam: 2.00 cm MV Decel Time: 179 msec MV E velocity: 91.20 cm/s MV A velocity: 93.30 cm/s MV E/A ratio:  0.98 Lonni End MD Electronically signed by Lonni Hanson MD Signature Date/Time: 02/07/2023/2:33:21 PM    Final    DG Foot 2 Views Right Result Date: 02/07/2023 CLINICAL DATA:  Right foot and ankle pain EXAM: RIGHT FOOT - 2 VIEW; RIGHT ANKLE - 2 VIEW COMPARISON:  None Available. FINDINGS: There is no evidence of fracture or dislocation. Diffuse osteopenia. Diffuse soft tissue reticulation of the lower leg and more focal soft tissue swelling over the lateral malleolus. Vascular calcifications. IMPRESSION: 1. No acute fracture or dislocation. 2. Diffuse soft tissue reticulation of the lower  leg and more focal soft tissue swelling over the lateral malleolus. Electronically Signed   By: Limin  Xu M.D.   On: 02/07/2023 09:49   DG Ankle 2 Views Right Result Date: 02/07/2023 CLINICAL DATA:  Right foot and ankle pain EXAM: RIGHT FOOT - 2 VIEW; RIGHT ANKLE - 2 VIEW COMPARISON:  None Available. FINDINGS: There is no evidence of fracture or dislocation. Diffuse osteopenia. Diffuse soft tissue reticulation of the lower leg and more focal soft tissue swelling over the  lateral malleolus. Vascular calcifications. IMPRESSION: 1. No acute fracture or dislocation. 2. Diffuse soft tissue reticulation of the lower leg and more focal soft tissue swelling over the lateral malleolus. Electronically Signed   By: Limin  Xu M.D.   On: 02/07/2023 09:49   CT Angio Chest PE W/Cm &/Or Wo Cm Result Date: 02/07/2023 CLINICAL DATA:  Abdominal pain, bowel obstruction suspected, sepsis, tachypneic and tachycardic. Hypoxemia requiring oxygen. Bilateral knee pain and abdominal distention and pain. PE suspected. EXAM: CT ANGIOGRAPHY CHEST CT ABDOMEN AND PELVIS WITH CONTRAST TECHNIQUE: Multidetector CT imaging of the chest was performed using the standard protocol during bolus administration of intravenous contrast. Multiplanar CT image reconstructions and MIPs were obtained to evaluate the vascular anatomy. Multidetector CT imaging of the abdomen and pelvis was performed using the standard protocol during bolus administration of intravenous contrast. RADIATION DOSE REDUCTION: This exam was performed according to the departmental dose-optimization program which includes automated exposure control, adjustment of the mA and/or kV according to patient size and/or use of iterative reconstruction technique. CONTRAST:  75mL OMNIPAQUE  IOHEXOL  350 MG/ML SOLN COMPARISON:  CT pelvis 07/14/2022 and CT chest 07/15/2016 FINDINGS: CTA CHEST FINDINGS Cardiovascular: Negative for acute pulmonary embolism. No pericardial effusion. Coronary artery and aortic atherosclerotic calcification. Mediastinum/Nodes: Esophagus is unremarkable. Nodules along the posterior trachea likely represent adherent debris. No lymphadenopathy. Lungs/Pleura: Respiratory motion obscures fine detail. Interlobular septal thickening and patchy ground-glass opacities. Bronchial wall thickening. Bibasilar atelectasis or infiltrates. No pleural effusion or pneumothorax. Musculoskeletal: Chronic left scapular and rib fractures. Subacute appearing left  posterior 10th-11th rib fractures. Chronic appearing compression fractures of T7 and T8. T7 kyphoplasty. Postoperative changes at the thoracolumbar junction. Review of the MIP images confirms the above findings. CT ABDOMEN and PELVIS FINDINGS Hepatobiliary: Respiratory motion obscures detail in the abdomen. No acute hepatobiliary abnormality. Pancreas: Unremarkable. Spleen: Unremarkable. Adrenals/Urinary Tract: Unremarkable adrenal glands. Bilateral small cystic lesions statistically likely to represent benign cysts. No follow-up recommended. Geographic hypoattenuation in the cortex of the left kidney may be due to artifact from streak and motion versus pyelonephritis. No hydronephrosis. Unremarkable bladder. Stomach/Bowel: Normal caliber large and small bowel. Extensive colonic diverticulosis without evidence of diverticulitis. Stomach is within normal limits. Vascular/Lymphatic: IVC filter. Aortic atherosclerosis. No enlarged abdominal or pelvic lymph nodes. Reproductive: No acute abnormality. Other: No free intraperitoneal fluid or air. Musculoskeletal: IM rod and screw fixation right femur. Left THA. Chronic right pubic rami fractures. No acute fracture. Review of the MIP images confirms the above findings. IMPRESSION: 1. Negative for acute pulmonary embolism. 2. Interlobular septal thickening and patchy ground-glass opacities with bibasilar atelectasis or infiltrates. Findings favor atypical infection. 3. Geographic hypoattenuation in the cortex of the left kidney may be due to streak artifact and motion versus pyelonephritis. Correlate with urinalysis. 4. Subacute appearing left posterior 10th-11th rib fractures. 5. Extensive colonic diverticulosis without evidence of diverticulitis. Aortic Atherosclerosis (ICD10-I70.0). Electronically Signed   By: Norman Gatlin M.D.   On: 02/07/2023 00:47   CT ABDOMEN PELVIS W CONTRAST Result  Date: 02/07/2023 CLINICAL DATA:  Abdominal pain, bowel obstruction suspected,  sepsis, tachypneic and tachycardic. Hypoxemia requiring oxygen. Bilateral knee pain and abdominal distention and pain. PE suspected. EXAM: CT ANGIOGRAPHY CHEST CT ABDOMEN AND PELVIS WITH CONTRAST TECHNIQUE: Multidetector CT imaging of the chest was performed using the standard protocol during bolus administration of intravenous contrast. Multiplanar CT image reconstructions and MIPs were obtained to evaluate the vascular anatomy. Multidetector CT imaging of the abdomen and pelvis was performed using the standard protocol during bolus administration of intravenous contrast. RADIATION DOSE REDUCTION: This exam was performed according to the departmental dose-optimization program which includes automated exposure control, adjustment of the mA and/or kV according to patient size and/or use of iterative reconstruction technique. CONTRAST:  75mL OMNIPAQUE  IOHEXOL  350 MG/ML SOLN COMPARISON:  CT pelvis 07/14/2022 and CT chest 07/15/2016 FINDINGS: CTA CHEST FINDINGS Cardiovascular: Negative for acute pulmonary embolism. No pericardial effusion. Coronary artery and aortic atherosclerotic calcification. Mediastinum/Nodes: Esophagus is unremarkable. Nodules along the posterior trachea likely represent adherent debris. No lymphadenopathy. Lungs/Pleura: Respiratory motion obscures fine detail. Interlobular septal thickening and patchy ground-glass opacities. Bronchial wall thickening. Bibasilar atelectasis or infiltrates. No pleural effusion or pneumothorax. Musculoskeletal: Chronic left scapular and rib fractures. Subacute appearing left posterior 10th-11th rib fractures. Chronic appearing compression fractures of T7 and T8. T7 kyphoplasty. Postoperative changes at the thoracolumbar junction. Review of the MIP images confirms the above findings. CT ABDOMEN and PELVIS FINDINGS Hepatobiliary: Respiratory motion obscures detail in the abdomen. No acute hepatobiliary abnormality. Pancreas: Unremarkable. Spleen: Unremarkable.  Adrenals/Urinary Tract: Unremarkable adrenal glands. Bilateral small cystic lesions statistically likely to represent benign cysts. No follow-up recommended. Geographic hypoattenuation in the cortex of the left kidney may be due to artifact from streak and motion versus pyelonephritis. No hydronephrosis. Unremarkable bladder. Stomach/Bowel: Normal caliber large and small bowel. Extensive colonic diverticulosis without evidence of diverticulitis. Stomach is within normal limits. Vascular/Lymphatic: IVC filter. Aortic atherosclerosis. No enlarged abdominal or pelvic lymph nodes. Reproductive: No acute abnormality. Other: No free intraperitoneal fluid or air. Musculoskeletal: IM rod and screw fixation right femur. Left THA. Chronic right pubic rami fractures. No acute fracture. Review of the MIP images confirms the above findings. IMPRESSION: 1. Negative for acute pulmonary embolism. 2. Interlobular septal thickening and patchy ground-glass opacities with bibasilar atelectasis or infiltrates. Findings favor atypical infection. 3. Geographic hypoattenuation in the cortex of the left kidney may be due to streak artifact and motion versus pyelonephritis. Correlate with urinalysis. 4. Subacute appearing left posterior 10th-11th rib fractures. 5. Extensive colonic diverticulosis without evidence of diverticulitis. Aortic Atherosclerosis (ICD10-I70.0). Electronically Signed   By: Norman Gatlin M.D.   On: 02/07/2023 00:47   DG Knee 2 Views Left Result Date: 02/07/2023 CLINICAL DATA:  Recent fall with knee pain, initial encounter EXAM: LEFT KNEE - 2 VIEW COMPARISON:  None Available. FINDINGS: Healed proximal fibular fracture is noted. No acute fracture or dislocation is noted. No soft tissue abnormality is seen. No joint effusion is noted. IMPRESSION: Healed proximal fibular fracture.  No acute abnormality noted. Electronically Signed   By: Oneil Devonshire M.D.   On: 02/07/2023 00:31   DG Knee 2 Views Right Result Date:  02/07/2023 CLINICAL DATA:  Fall, pain EXAM: RIGHT KNEE - 1-2 VIEW COMPARISON:  None Available. FINDINGS: Chondrocalcinosis and mild degenerative changes throughout the right knee. No acute bony abnormality. Specifically, no fracture, subluxation, or dislocation. No joint effusion. Vascular calcifications noted. IMPRESSION: No acute bony abnormality. Electronically Signed   By: Franky Crease M.D.   On:  02/07/2023 00:31   DG Chest Port 1 View Result Date: 02/06/2023 CLINICAL DATA:  Questionable sepsis - evaluate for abnormality. Low O2 sats. EXAM: PORTABLE CHEST 1 VIEW COMPARISON:  07/14/2022 FINDINGS: Heart is borderline in size. Mediastinal contours within normal limits. Scattered aortic atherosclerosis. Questionable patchy opacity in the right upper lobe. No confluent opacity on the left. No effusions. No acute bony abnormality. Old healed left rib fractures. IMPRESSION: Questionable patchy right upper lobe opacity/infiltrate. Borderline heart size Electronically Signed   By: Franky Crease M.D.   On: 02/06/2023 23:28          Laneta Blunt, DO [facility] 02/10/2023, 5:14 PM    Dictation software may have been used to generate the above note. Typos may occur and escape review in typed/dictated notes. Please contact Dr Blunt directly for clarity if needed.  Staff may message me via secure chat in Epic  but this may not receive an immediate response,  please page me for urgent matters!  If 7PM-7AM, please contact night coverage [email or web address]

## 2023-02-10 NOTE — Progress Notes (Signed)
 This RN attempted to administer AM PO meds. Verbal and tactile stimulation performed but patient did not wake to safely consume medications. Dr. Lyn Hollingshead at bedside.

## 2023-02-10 NOTE — Plan of Care (Signed)

## 2023-02-11 ENCOUNTER — Inpatient Hospital Stay: Payer: PPO

## 2023-02-11 DIAGNOSIS — J189 Pneumonia, unspecified organism: Secondary | ICD-10-CM | POA: Diagnosis not present

## 2023-02-11 DIAGNOSIS — I4891 Unspecified atrial fibrillation: Secondary | ICD-10-CM | POA: Diagnosis not present

## 2023-02-11 LAB — CBC
HCT: 34.1 % — ABNORMAL LOW (ref 36.0–46.0)
Hemoglobin: 11.6 g/dL — ABNORMAL LOW (ref 12.0–15.0)
MCH: 31.4 pg (ref 26.0–34.0)
MCHC: 34 g/dL (ref 30.0–36.0)
MCV: 92.2 fL (ref 80.0–100.0)
Platelets: 325 10*3/uL (ref 150–400)
RBC: 3.7 MIL/uL — ABNORMAL LOW (ref 3.87–5.11)
RDW: 15.3 % (ref 11.5–15.5)
WBC: 18.8 10*3/uL — ABNORMAL HIGH (ref 4.0–10.5)
nRBC: 0 % (ref 0.0–0.2)

## 2023-02-11 LAB — BASIC METABOLIC PANEL
Anion gap: 12 (ref 5–15)
BUN: 31 mg/dL — ABNORMAL HIGH (ref 8–23)
CO2: 31 mmol/L (ref 22–32)
Calcium: 8.7 mg/dL — ABNORMAL LOW (ref 8.9–10.3)
Chloride: 104 mmol/L (ref 98–111)
Creatinine, Ser: 0.83 mg/dL (ref 0.44–1.00)
GFR, Estimated: >60 mL/min (ref >60)
Glucose, Bld: 227 mg/dL — ABNORMAL HIGH (ref 70–99)
Potassium: 3.1 mmol/L — ABNORMAL LOW (ref 3.5–5.1)
Sodium: 147 mmol/L — ABNORMAL HIGH (ref 135–145)

## 2023-02-11 LAB — GLUCOSE, CAPILLARY
Glucose-Capillary: 222 mg/dL — ABNORMAL HIGH (ref 70–99)
Glucose-Capillary: 225 mg/dL — ABNORMAL HIGH (ref 70–99)
Glucose-Capillary: 212 mg/dL — ABNORMAL HIGH (ref 70–99)
Glucose-Capillary: 291 mg/dL — ABNORMAL HIGH (ref 70–99)
Glucose-Capillary: 291 mg/dL — ABNORMAL HIGH (ref 70–99)

## 2023-02-11 LAB — BLOOD GAS, ARTERIAL
Acid-Base Excess: 8.5 mmol/L — ABNORMAL HIGH (ref 0.0–2.0)
Bicarbonate: 34.5 mmol/L — ABNORMAL HIGH (ref 20.0–28.0)
O2 Content: 12 L/min
O2 Saturation: 88.8 %
Patient temperature: 37
pCO2 arterial: 52 mm[Hg] — ABNORMAL HIGH (ref 32–48)
pH, Arterial: 7.43 (ref 7.35–7.45)
pO2, Arterial: 59 mm[Hg] — ABNORMAL LOW (ref 83–108)

## 2023-02-11 LAB — CULTURE, BLOOD (ROUTINE X 2)
Culture: NO GROWTH
Culture: NO GROWTH
Special Requests: ADEQUATE
Special Requests: ADEQUATE

## 2023-02-11 LAB — HEPARIN LEVEL (UNFRACTIONATED)
Heparin Unfractionated: 0.72 [IU]/mL — ABNORMAL HIGH (ref 0.30–0.70)
Heparin Unfractionated: 0.38 [IU]/mL (ref 0.30–0.70)
Heparin Unfractionated: 0.59 [IU]/mL (ref 0.30–0.70)

## 2023-02-11 MED ORDER — DILTIAZEM HCL-DEXTROSE 125-5 MG/125ML-% IV SOLN (PREMIX)
5.0000 mg/h | INTRAVENOUS | Status: DC
  Administered 2023-02-11 (×2): 5 mg/h via INTRAVENOUS
  Filled 2023-02-11 (×2): qty 125

## 2023-02-11 MED ORDER — AMIODARONE LOAD VIA INFUSION
150.0000 mg | Freq: Once | INTRAVENOUS | Status: AC
  Administered 2023-02-11: 150 mg via INTRAVENOUS
  Filled 2023-02-11: qty 83.34

## 2023-02-11 MED ORDER — FUROSEMIDE 10 MG/ML IJ SOLN
40.0000 mg | Freq: Once | INTRAMUSCULAR | Status: AC
  Administered 2023-02-11: 40 mg via INTRAVENOUS
  Filled 2023-02-11: qty 4

## 2023-02-11 MED ORDER — METHYLPREDNISOLONE SODIUM SUCC 125 MG IJ SOLR
125.0000 mg | Freq: Once | INTRAMUSCULAR | Status: AC
  Administered 2023-02-11: 125 mg via INTRAVENOUS
  Filled 2023-02-11: qty 2

## 2023-02-11 MED ORDER — IPRATROPIUM-ALBUTEROL 0.5-2.5 (3) MG/3ML IN SOLN: 3.0000 mL | Freq: Four times a day (QID) | RESPIRATORY_TRACT | Status: DC

## 2023-02-11 MED ORDER — DILTIAZEM HCL 25 MG/5ML IV SOLN
10.0000 mg | Freq: Once | INTRAVENOUS | Status: AC
  Administered 2023-02-11: 10 mg via INTRAVENOUS
  Filled 2023-02-11: qty 5

## 2023-02-11 MED ORDER — IPRATROPIUM BROMIDE 0.02 % IN SOLN
0.5000 mg | Freq: Four times a day (QID) | RESPIRATORY_TRACT | Status: AC
  Administered 2023-02-11 – 2023-02-12 (×4): 0.5 mg via RESPIRATORY_TRACT
  Filled 2023-02-11 (×4): qty 2.5

## 2023-02-11 MED ORDER — AMIODARONE HCL IN DEXTROSE 360-4.14 MG/200ML-% IV SOLN
30.0000 mg/h | INTRAVENOUS | Status: DC
  Administered 2023-02-11 – 2023-02-12 (×2): 30 mg/h via INTRAVENOUS
  Filled 2023-02-11: qty 200

## 2023-02-11 MED ORDER — AMIODARONE HCL IN DEXTROSE 360-4.14 MG/200ML-% IV SOLN
60.0000 mg/h | INTRAVENOUS | Status: AC
Start: 2023-02-11 — End: 2023-02-11
  Administered 2023-02-11 (×2): 60 mg/h via INTRAVENOUS
  Filled 2023-02-11 (×2): qty 200

## 2023-02-11 MED ORDER — METHYLPREDNISOLONE SODIUM SUCC 40 MG IJ SOLR
40.0000 mg | Freq: Two times a day (BID) | INTRAMUSCULAR | Status: AC
  Administered 2023-02-11 – 2023-02-13 (×4): 40 mg via INTRAVENOUS
  Filled 2023-02-11 (×4): qty 1

## 2023-02-11 MED ORDER — SODIUM CHLORIDE 0.9 % IV SOLN
500.0000 mg | Freq: Once | INTRAVENOUS | Status: AC
  Administered 2023-02-11: 500 mg via INTRAVENOUS
  Filled 2023-02-11: qty 5

## 2023-02-11 MED ORDER — POTASSIUM CHLORIDE 10 MEQ/100ML IV SOLN
10.0000 meq | INTRAVENOUS | Status: AC
Start: 2023-02-11 — End: 2023-02-11
  Administered 2023-02-11 (×4): 10 meq via INTRAVENOUS
  Filled 2023-02-11 (×4): qty 100

## 2023-02-11 NOTE — Progress Notes (Signed)
 Pt placed on BiPap after multiple episodes of desaturations and requiring 10+L HFNC.

## 2023-02-11 NOTE — Plan of Care (Signed)
  Problem: Respiratory: Goal: Ability to maintain adequate ventilation will improve Outcome: Progressing   Problem: Education: Goal: Knowledge of General Education information will improve Description: Including pain rating scale, medication(s)/side effects and non-pharmacologic comfort measures Outcome: Progressing   Problem: Coping: Goal: Level of anxiety will decrease Outcome: Progressing   Problem: Pain Management: Goal: General experience of comfort will improve Outcome: Progressing   Problem: Skin Integrity: Goal: Risk for impaired skin integrity will decrease Outcome: Progressing

## 2023-02-11 NOTE — Progress Notes (Signed)
   Patient Name: Lisa Martin Date of Encounter: 02/11/2023 [facility] Cardiologist: Lonni Hanson, MD   Interval Summary  .    Feeling tired.  Denies palpitations or shortness of breath.  Denies chest pain.  Vital Signs .    Vitals:   02/11/23 1130 02/11/23 1148 02/11/23 1200 02/11/23 1215  BP: 115/89  97/82 102/80  Pulse: (!) 157  (!) 156 (!) 156  Resp: (!) 26  (!) 32 13  Temp:  98.3 F (36.8 C)    TempSrc:      SpO2: 96%  96% 96%  Weight:      Height:        Intake/Output Summary (Last 24 hours) at 02/11/2023 1232 Last data filed at 02/11/2023 1100 Gross per 24 hour  Intake 795.79 ml  Output 1950 ml  Net -1154.21 ml      02/06/2023   11:11 PM 07/14/2022    5:02 PM 06/22/2022    9:07 PM  Last 3 Weights  Weight (lbs) 127 lb 13.9 oz 100 lb 14.4 oz 110 lb  Weight (kg) 58 kg 45.768 kg 49.896 kg      Telemetry/ECG    Sinus rhythm converted to atrial fibrillation.  Rates uncontrolled.- Personally Reviewed  Physical Exam .    VS:  BP 102/80   Pulse (!) 156   Temp 98.3 F (36.8 C)   Resp 13   Ht 5' 2 (1.575 m)   Wt 58 kg   SpO2 96%   BMI 23.39 kg/m  , BMI Body mass index is 23.39 kg/m. GENERAL: Frail.  No acute distress. HEENT: Pupils equal round and reactive, fundi not visualized, oral mucosa unremarkable NECK:  No jugular venous distention, waveform within normal limits, carotid upstroke brisk and symmetric, no bruits, no thyromegaly LUNGS:  Clear to auscultation bilaterally HEART: Tachycardic.  Irregularly irregular.  PMI not displaced or sustained,S1 and S2 within normal limits, no S3, no S4, no clicks, no rubs, no murmurs ABD:  Flat, positive bowel sounds normal in frequency in pitch, no bruits, no rebound, no guarding, no midline pulsatile mass, no hepatomegaly, no splenomegaly EXT:  2 plus pulses throughout, no edema, no cyanosis no clubbing SKIN:  No rashes no nodules NEURO:  Cranial nerves II through XII grossly intact, motor grossly intact  throughout PSYCH:  Cognitively intact, oriented to person place and time   Assessment & Plan .     89 year old female with advanced dementia, breast cancer, recurrent falls status post hip and rib fractures, hypertension, hypothyroidism, depression, and anxiety admitted with pneumonia and sepsis.  Cardiology consulted for atrial fibrillation with rapid ventricular response.  # Atrial fibrillation with rapid ventricular spots: Patient converted back into atrial fibrillation today.  Rates are uncontrolled despite being on a diltiazem  infusion.  Blood pressure is too low to further titrate her drip.  She is not a candidate for anticoagulation due to frequent falls and fractures.  We will start amiodarone  bolus and infusion for both rate and rhythm control.  She remains on IV heparin  while in the hospital but will not be discharged on anticoagulation.   For questions or updates, please contact [facility] Please consult [email or web address] for contact info under     Signed, Annabella Scarce, MD

## 2023-02-11 NOTE — Progress Notes (Signed)
 ANTICOAGULATION CONSULT NOTE  Pharmacy Consult for heparin  infusion Indication: atrial fibrillation  Allergies  Allergen Reactions   Atorvastatin Cough   Patient Measurements: Height: 5' 2 (157.5 cm) Weight: 58 kg (127 lb 13.9 oz) IBW/kg (Calculated) : 50.1 Heparin  Dosing Weight: 58 kg  Vital Signs: Temp: 98.3 F (36.8 C) (01/11 1148) Temp Source: Axillary (01/11 0846) BP: 94/57 (01/11 1430) Pulse Rate: 92 (01/11 1454)  Labs: Recent Labs    02/09/23 0634 02/09/23 1418 02/10/23 0334 02/11/23 0422 02/11/23 1456  HGB 10.3*  --  10.8* 11.6*  --   HCT 29.6*  --  30.8* 34.1*  --   PLT 313  --  334 325  --   HEPARINUNFRC 0.53   < > 0.45 0.72* 0.38  CREATININE 0.82  --  0.90 0.83  --    < > = values in this interval not displayed.   Estimated Creatinine Clearance: 36.3 mL/min (by C-G formula based on SCr of 0.83 mg/dL).  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 In the ED they were noted to have atrial fibrillation with rapid ventricular response, currently in sinus rhythm. Pharmacy has been consulted for initiation and management of heparin  infusion for atrial fibrillation.   Goal of Therapy:  Heparin  level 0.3-0.7  units/ml Monitor platelets by anticoagulation protocol: Yes  Heparin  Levels Date/Time HL Clinical Assessment  1/7@0740   0.33 Therapeutic x 1  1/7@1501   0.75 SUPRAtherapeutic   1/8 @ 0228 0.30 Therapeutic x 1  1/8 @ 1104 0.21 Subtherapeutic   1/8 @ 2138 0.17 Subtherapeutic  1/9 @ 0634 0.53 Therapeutic x 1   1/9 @ 1418 0.48 Therapeutic x 2   1/10 0334 0.45 Therapeutic x 3  1/11 0422 0.72 Supratherapeutic  1/11@1456  0.38 Therapeutic x 1   Plan:   Continue heparin  infusion rate at 1000 units/hr  Check confirmatory HL in 8 hrs  CBC stable; monitor daily and for s/sx of bleeding   Thank you for involving pharmacy in this patient's care.   Lum Mania, PharmD Clinical Pharmacist  02/11/2023 3:27 PM

## 2023-02-11 NOTE — Progress Notes (Addendum)
 PROGRESS NOTE    Lisa Martin   FMW:[id number] DOB: Jul 19, 1931  DOA: 02/06/2023 Date of Service: 02/11/23 which is hospital day 4  PCP: Cleotilde Oneil FALCON, MD    Hospital course / significant events:   HPI: Lisa Martin is a 89 y.o. female with medical history significant for advanced dementia,  HTN, dCHF, hypothyroidism, depression with anxiety, GERD, breast cancer (s/p of radiation and chemotherapy), protein calorie malnutrition, prior falls with resulting hip fracture, rib fractures, with most recent fall 07/2022 with pelvic fracture treated nonoperatively, who presents from memory care unit with concerns for tachycardia and tachypnea with hypoxia to 89% requiring 6 L by EMS to maintain sats in the mid 90s.  She was also tachycardic to 170.  Patient unable to contribute to history due to dementia.   01/06: admitted to hospitalist service for sepsis d/t pneumonia, (+)Rhinovirus, AfibRVR 01/07: converted to sinus rhythm. Echo w/ preserved EF Grade 1 diast df  01/08: titrated po diltiazem  ER 120 mg daily. Tachypneic, not tolerating BiPap 01/09-01/10: sleepy but easily awoken, confused/disoriented, tachypneic but not in serious distress, not eating. Consider for palliative if not improving. Not responding to cues for po intake. See IPAL note.  01/11: more hypoxic overnight required BiPap but this caused agitation. Into Afib RVR this am - see note. D/w daughter, will continue tx as able, dilt IVx1 did not convert, placed on dilt gtt and cardiology consult. Pt able to come off BiPap and more calm mid-day. Closely monitoring.      Consultants:  Cardiology   Procedures/Surgeries: none      ASSESSMENT & PLAN:   CAP Rhinovirus Sepsis d/t pneumonia  Worsening appearance on CXR question d/t IV fluids vs aspiration vs progressive disease  ceftriaxone /azithromycin  will also schedule ipratropium breathing treatments. Holding on albuterol  given a-fib   Acute hypoxic respiratory failure -  worsening overnight  2/2 pneumonia.  BiPAP/CPAP prn D/c IV fluids and restart steroids d/t worsening lung infiltrate ABG around 1-2 pm if not mentating better but given intolerance to BiPAP am reluctant to readminister that unless needed.  See above for pneumonia tx- steroids, lasix     A-fib RVR, recurrent rapid rate this morning  continue heparin  Titrate diltiazem  gtt as able given soft BP  cardiology to follow, appreciate recs re further management, prognosis   Right foot pain No erythema. X-rays no fracture or other acute process monitor   Hypothyroid Tsh wnl cont home levothyroxine    Advanced alzheimer's dementia cont home memantine , seroquel , rivastigmine    MDD home sertraline      DVT prophylaxis: heparin  IV fluids: no continuous IV fluids  Nutrition: dysphagia, low PO intake  Central lines / invasive devices: none  Code Status: DNR ACP documentation reviewed:  none on file in [facility]  TOC needs: TBD Barriers to dispo / significant pending items: clinical illness              Subjective / Brief ROS:  Patient this morning was sleepy but rousable, answering that she was in pain but unable/unwilling to elaborate. Trying to pull off BiPap. Pt unable to contribute. Daughter reports she was fairly calm overnight    Family Communication: discussed w/ daughter at bedside mid-day      Objective Findings:  Vitals:   02/11/23 1030 02/11/23 1045 02/11/23 1100 02/11/23 1115  BP: 111/76 117/74 104/68 (!) 115/92  Pulse: 95 (!) 49 (!) 104 (!) 155  Resp: (!) 26 (!) 27 (!) 26 (!) 22  Temp:  TempSrc:      SpO2: 92% 96% 96% 96%  Weight:      Height:        Intake/Output Summary (Last 24 hours) at 02/11/2023 1138 Last data filed at 02/11/2023 1003 Gross per 24 hour  Intake 776.3 ml  Output 1950 ml  Net -1173.7 ml   Filed Weights   02/06/23 2311  Weight: 58 kg    Examination:  Physical Exam Constitutional:      General: She is not in acute  distress (better on reexamination mid-day, definitely more distress earlier this mornin).    Appearance: She is ill-appearing.  Pulmonary:     Effort: Tachypnea present. No respiratory distress.     Breath sounds: Decreased breath sounds and wheezing present.  Musculoskeletal:     Right lower leg: No edema.     Left lower leg: No edema.  Skin:    General: Skin is warm and dry.  Neurological:     Mental Status: She is disoriented.  Psychiatric:        Behavior: Behavior normal.          Scheduled Medications:   ipratropium  0.5 mg Nebulization Q6H   levothyroxine   125 mcg Oral Q0600   memantine   5 mg Oral BID   methylPREDNISolone  (SOLU-MEDROL ) injection  40 mg Intravenous Q12H   sertraline   25 mg Oral Daily    Continuous Infusions:  azithromycin      cefTRIAXone  (ROCEPHIN )  IV Stopped (02/10/23 1525)   diltiazem  (CARDIZEM ) infusion 10 mg/hr (02/11/23 1003)   heparin  1,000 Units/hr (02/11/23 1003)    PRN Medications:  acetaminophen  **OR** acetaminophen , haloperidol  lactate, ondansetron  **OR** ondansetron  (ZOFRAN ) IV  Antimicrobials from admission:  Anti-infectives (From admission, onward)    Start     Dose/Rate Route Frequency Ordered Stop   02/11/23 1230  azithromycin  (ZITHROMAX ) 500 mg in sodium chloride  0.9 % 250 mL IVPB        500 mg 250 mL/hr over 60 Minutes Intravenous  Once 02/11/23 1136     02/10/23 0600  azithromycin  (ZITHROMAX ) tablet 500 mg  Status:  Discontinued        500 mg Oral Daily 02/09/23 1429 02/11/23 1136   02/07/23 1200  cefTRIAXone  (ROCEPHIN ) 2 g in sodium chloride  0.9 % 100 mL IVPB        2 g 200 mL/hr over 30 Minutes Intravenous Every 24 hours 02/07/23 0147 02/12/23 1159   02/07/23 0600  azithromycin  (ZITHROMAX ) 500 mg in sodium chloride  0.9 % 250 mL IVPB  Status:  Discontinued        500 mg 250 mL/hr over 60 Minutes Intravenous Every 24 hours 02/07/23 0147 02/09/23 1429   02/07/23 0000  vancomycin  (VANCOREADY) IVPB 1250 mg/250 mL         1,250 mg 166.7 mL/hr over 90 Minutes Intravenous  Once 02/06/23 2351 02/07/23 0450   02/06/23 2345  ceFEPIme  (MAXIPIME ) 2 g in sodium chloride  0.9 % 100 mL IVPB        2 g 200 mL/hr over 30 Minutes Intravenous  Once 02/06/23 2337 02/07/23 0030           Data Reviewed:  I have personally reviewed the following...  CBC: Recent Labs  Lab 02/06/23 2314 02/08/23 0510 02/09/23 0634 02/10/23 0334 02/11/23 0422  WBC 22.9* 20.1* 23.1* 21.2* 18.8*  NEUTROABS 20.7*  --   --   --   --   HGB 11.7* 10.8* 10.3* 10.8* 11.6*  HCT 33.8* 31.4* 29.6* 30.8* 34.1*  MCV 90.4 90.0 89.7 88.5 92.2  PLT 354 326 313 334 325   Basic Metabolic Panel: Recent Labs  Lab 02/06/23 2314 02/08/23 0510 02/09/23 0634 02/10/23 0334 02/11/23 0422  NA 139 142 142 144 147*  K 4.0 4.0 3.7 3.3* 3.1*  CL 105 110 106 105 104  CO2 21* 20* 24 27 31   GLUCOSE 177* 158* 191* 187* 227*  BUN 31* 16 26* 30* 31*  CREATININE 0.95 0.64 0.82 0.90 0.83  CALCIUM 8.6* 8.8* 8.9 9.0 8.7*   GFR: Estimated Creatinine Clearance: 36.3 mL/min (by C-G formula based on SCr of 0.83 mg/dL). Liver Function Tests: Recent Labs  Lab 02/06/23 2314  AST 21  ALT 20  ALKPHOS 71  BILITOT 1.1  PROT 7.3  ALBUMIN 3.7   No results for input(s): LIPASE, AMYLASE in the last 168 hours. No results for input(s): AMMONIA in the last 168 hours. Coagulation Profile: Recent Labs  Lab 02/06/23 2314  INR 1.3*   Cardiac Enzymes: No results for input(s): CKTOTAL, CKMB, CKMBINDEX, TROPONINI in the last 168 hours. BNP (last 3 results) No results for input(s): PROBNP in the last 8760 hours. HbA1C: No results for input(s): HGBA1C in the last 72 hours. CBG: Recent Labs  Lab 02/11/23 0358 02/11/23 0812  GLUCAP 222* 225*   Lipid Profile: No results for input(s): CHOL, HDL, LDLCALC, TRIG, CHOLHDL, LDLDIRECT in the last 72 hours. Thyroid  Function Tests: No results for input(s): TSH, T4TOTAL, FREET4,  T3FREE, THYROIDAB in the last 72 hours.  Anemia Panel: No results for input(s): VITAMINB12, FOLATE, FERRITIN, TIBC, IRON, RETICCTPCT in the last 72 hours. Most Recent Urinalysis On File:     Component Value Date/Time   COLORURINE YELLOW (A) 02/07/2023 0654   APPEARANCEUR CLEAR (A) 02/07/2023 0654   LABSPEC >1.046 (H) 02/07/2023 0654   PHURINE 5.0 02/07/2023 0654   GLUCOSEU NEGATIVE 02/07/2023 0654   HGBUR NEGATIVE 02/07/2023 0654   BILIRUBINUR NEGATIVE 02/07/2023 0654   KETONESUR NEGATIVE 02/07/2023 0654   PROTEINUR NEGATIVE 02/07/2023 0654   NITRITE NEGATIVE 02/07/2023 0654   LEUKOCYTESUR NEGATIVE 02/07/2023 0654   Sepsis Labs: @LABRCNTIP (procalcitonin:4,lacticidven:4) Microbiology: Recent Results (from the past 240 hours)  Blood Culture (routine x 2)     Status: None   Collection Time: 02/06/23 11:14 PM   Specimen: BLOOD  Result Value Ref Range Status   Specimen Description BLOOD BLOOD RIGHT WRIST  Final   Special Requests   Final    BOTTLES DRAWN AEROBIC AND ANAEROBIC Blood Culture adequate volume   Culture   Final    NO GROWTH 5 DAYS Performed at [facility], [address] [place], [place] [address]    Report Status 02/11/2023 FINAL  Final  Blood Culture (routine x 2)     Status: None   Collection Time: 02/06/23 11:14 PM   Specimen: BLOOD  Result Value Ref Range Status   Specimen Description BLOOD BLOOD LEFT FOREARM  Final   Special Requests   Final    BOTTLES DRAWN AEROBIC AND ANAEROBIC Blood Culture adequate volume   Culture   Final    NO GROWTH 5 DAYS Performed at [facility], [address] [place], [place] [address]    Report Status 02/11/2023 FINAL  Final  Resp panel by RT-PCR (RSV, Flu A&B, Covid) Anterior Nasal Swab     Status: None   Collection Time: 02/06/23 11:35 PM   Specimen: Anterior Nasal Swab  Result Value Ref Range Status   SARS Coronavirus 2 by RT PCR NEGATIVE  NEGATIVE Final    Comment:  (NOTE) SARS-CoV-2 target nucleic acids are NOT DETECTED.  The SARS-CoV-2 RNA is generally detectable in upper respiratory specimens during the acute phase of infection. The lowest concentration of SARS-CoV-2 viral copies this assay can detect is 138 copies/mL. A negative result does not preclude SARS-Cov-2 infection and should not be used as the sole basis for treatment or other patient management decisions. A negative result may occur with  improper specimen collection/handling, submission of specimen other than nasopharyngeal swab, presence of viral mutation(s) within the areas targeted by this assay, and inadequate number of viral copies(<138 copies/mL). A negative result must be combined with clinical observations, patient history, and epidemiological information. The expected result is Negative.  Fact Sheet for Patients:  bloggercourse.com  Fact Sheet for Healthcare Providers:  seriousbroker.it  This test is no t yet approved or cleared by the [place]  FDA and  has been authorized for detection and/or diagnosis of SARS-CoV-2 by FDA under an Emergency Use Authorization (EUA). This EUA will remain  in effect (meaning this test can be used) for the duration of the COVID-19 declaration under Section 564(b)(1) of the Act, 21 U.S.C.section 360bbb-3(b)(1), unless the authorization is terminated  or revoked sooner.       Influenza A by PCR NEGATIVE NEGATIVE Final   Influenza B by PCR NEGATIVE NEGATIVE Final    Comment: (NOTE) The Xpert Xpress SARS-CoV-2/FLU/RSV plus assay is intended as an aid in the diagnosis of influenza from Nasopharyngeal swab specimens and should not be used as a sole basis for treatment. Nasal washings and aspirates are unacceptable for Xpert Xpress SARS-CoV-2/FLU/RSV testing.  Fact Sheet for Patients: bloggercourse.com  Fact Sheet for Healthcare  Providers: seriousbroker.it  This test is not yet approved or cleared by the [place]  FDA and has been authorized for detection and/or diagnosis of SARS-CoV-2 by FDA under an Emergency Use Authorization (EUA). This EUA will remain in effect (meaning this test can be used) for the duration of the COVID-19 declaration under Section 564(b)(1) of the Act, 21 U.S.C. section 360bbb-3(b)(1), unless the authorization is terminated or revoked.     Resp Syncytial Virus by PCR NEGATIVE NEGATIVE Final    Comment: (NOTE) Fact Sheet for Patients: bloggercourse.com  Fact Sheet for Healthcare Providers: seriousbroker.it  This test is not yet approved or cleared by the [place]  FDA and has been authorized for detection and/or diagnosis of SARS-CoV-2 by FDA under an Emergency Use Authorization (EUA). This EUA will remain in effect (meaning this test can be used) for the duration of the COVID-19 declaration under Section 564(b)(1) of the Act, 21 U.S.C. section 360bbb-3(b)(1), unless the authorization is terminated or revoked.  Performed at [facility], [address] [place], [place] [address]   Respiratory (~20 pathogens) panel by PCR     Status: Abnormal   Collection Time: 02/07/23  9:21 AM   Specimen: Nasopharyngeal Swab; Respiratory  Result Value Ref Range Status   Adenovirus NOT DETECTED NOT DETECTED Final   Coronavirus 229E NOT DETECTED NOT DETECTED Final    Comment: (NOTE) The Coronavirus on the Respiratory Panel, DOES NOT test for the novel  Coronavirus (2019 nCoV)    Coronavirus HKU1 NOT DETECTED NOT DETECTED Final   Coronavirus NL63 NOT DETECTED NOT DETECTED Final   Coronavirus OC43 NOT DETECTED NOT DETECTED Final   Metapneumovirus NOT DETECTED NOT DETECTED Final   Rhinovirus / Enterovirus DETECTED (A) NOT DETECTED Final   Influenza A NOT DETECTED NOT DETECTED Final  Influenza B NOT  DETECTED NOT DETECTED Final   Parainfluenza Virus 1 NOT DETECTED NOT DETECTED Final   Parainfluenza Virus 2 NOT DETECTED NOT DETECTED Final   Parainfluenza Virus 3 NOT DETECTED NOT DETECTED Final   Parainfluenza Virus 4 NOT DETECTED NOT DETECTED Final   Respiratory Syncytial Virus NOT DETECTED NOT DETECTED Final   Bordetella pertussis NOT DETECTED NOT DETECTED Final   Bordetella Parapertussis NOT DETECTED NOT DETECTED Final   Chlamydophila pneumoniae NOT DETECTED NOT DETECTED Final   Mycoplasma pneumoniae NOT DETECTED NOT DETECTED Final    Comment: Performed at [facility], [address], [place], [place] [address]  MRSA Next Gen by PCR, Nasal     Status: None   Collection Time: 02/07/23 10:14 AM   Specimen: Nasal Mucosa; Nasal Swab  Result Value Ref Range Status   MRSA by PCR Next Gen NOT DETECTED NOT DETECTED Final    Comment: (NOTE) The GeneXpert MRSA Assay (FDA approved for NASAL specimens only), is one component of a comprehensive MRSA colonization surveillance program. It is not intended to diagnose MRSA infection nor to guide or monitor treatment for MRSA infections. Test performance is not FDA approved in patients less than 47 years old. Performed at [facility], [address], [place], [place] [address]       Radiology Studies last 3 days: [facility] Chest [facility] 1 View Result Date: 02/11/2023 CLINICAL DATA:  36304 Hypoxemia 36304 EXAM: PORTABLE CHEST 1 VIEW COMPARISON:  Chest x-ray 02/06/2023, CT chest 02/07/2023 FINDINGS: Patient is rotated. The heart and mediastinal contours are unchanged. Atherosclerotic plaque. Interval worsening of right upper lobe airspace opacity. Chronic coarsened interstitial markings with likely superimposed mild pulmonary edema. Interval development of at least bilateral small, left greater than right, pleural effusions. No pneumothorax. No acute osseous abnormality. Thoracic kyphoplasty. Thoracolumbar surgical hardware corpectomy.  Multiple old healed left rib fractures. IMPRESSION: 1. Interval worsening of right upper lobe airspace opacity. 2. Likely mild pulmonary edema. 3. Interval development of at least bilateral small, left greater than right, pleural effusions. Electronically Signed   By: Morgane  Naveau M.D.   On: 02/11/2023 01:47   ECHOCARDIOGRAM COMPLETE Result Date: 02/07/2023    ECHOCARDIOGRAM REPORT   Patient Name:   TRANISHA TISSUE Date of Exam: 02/07/2023 Medical Rec #:  [id number]   Height:       62.0 in Accession #:    [id number]  Weight:       127.9 lb Date of Birth:  [birth year]   BSA:          1.581 m Patient Age:    89 years    BP:           125/67 mmHg Patient Gender: F           HR:           77 bpm. Exam Location:  [facility] Procedure: 2D Echo, Cardiac Doppler and Color Doppler Indications:     Atrial Fibrillation  History:         Patient has no prior history of Echocardiogram examinations.                  CHF, Arrythmias:Atrial Fibrillation, Signs/Symptoms:Altered                  Mental Status; Risk Factors:Hypertension.  Sonographer:     Naomie Reef Referring Phys:  [id number] DELAYNE LULLA SOLIAN Diagnosing Phys: Lonni Hanson MD  Sonographer Comments: Technically difficult study due to poor  echo windows. Image acquisition challenging due to patient behavioral factors. IMPRESSIONS  1. Left ventricular ejection fraction, by estimation, is >55%. The left ventricle has normal function. Left ventricular endocardial border not optimally defined to evaluate regional wall motion. Left ventricular diastolic parameters are consistent with Grade I diastolic dysfunction (impaired relaxation).  2. Right ventricular systolic function is normal. The right ventricular size is normal.  3. Left atrial size was mildly dilated.  4. The mitral valve is normal in structure. Trivial mitral valve regurgitation. No evidence of mitral stenosis.  5. Tricuspid valve regurgitation is moderate.  6. The aortic valve was not well visualized. Aortic valve  regurgitation is trivial. FINDINGS  Left Ventricle: Left ventricular ejection fraction, by estimation, is >55%. The left ventricle has normal function. Left ventricular endocardial border not optimally defined to evaluate regional wall motion. The left ventricular internal cavity size was  normal in size. There is borderline left ventricular hypertrophy. Left ventricular diastolic parameters are consistent with Grade I diastolic dysfunction (impaired relaxation). Right Ventricle: The right ventricular size is normal. No increase in right ventricular wall thickness. Right ventricular systolic function is normal. Left Atrium: Left atrial size was mildly dilated. Right Atrium: Right atrial size was normal in size. Pericardium: The pericardium was not well visualized. Mitral Valve: The mitral valve is normal in structure. Trivial mitral valve regurgitation. No evidence of mitral valve stenosis. MV peak gradient, 5.1 mmHg. The mean mitral valve gradient is 2.0 mmHg. Tricuspid Valve: The tricuspid valve is not well visualized. Tricuspid valve regurgitation is moderate. Aortic Valve: The aortic valve was not well visualized. Aortic valve regurgitation is trivial. Aortic valve mean gradient measures 2.0 mmHg. Aortic valve peak gradient measures 5.5 mmHg. Aortic valve area, by VTI measures 2.27 cm. Pulmonic Valve: The pulmonic valve was not well visualized. Pulmonic valve regurgitation is trivial. No evidence of pulmonic stenosis. Aorta: The aortic root is normal in size and structure. Pulmonary Artery: The pulmonary artery is not well seen. Venous: The inferior vena cava was not well visualized. IAS/Shunts: The interatrial septum was not well visualized.  LEFT VENTRICLE PLAX 2D LVIDd:         4.20 cm   Diastology LVIDs:         2.50 cm   LV e' medial:    7.72 cm/s LV PW:         0.90 cm   LV E/e' medial:  11.8 LV IVS:        1.10 cm   LV e' lateral:   8.16 cm/s LVOT diam:     2.00 cm   LV E/e' lateral: 11.2 LV SV:          57 LV SV Index:   36 LVOT Area:     3.14 cm  LEFT ATRIUM           Index        RIGHT ATRIUM           Index LA diam:      2.90 cm 1.83 cm/m   RA Area:     12.84 cm LA Vol (A2C): 37.6 ml 23.79 ml/m  RA Volume:   29.17 ml  18.45 ml/m LA Vol (A4C): 65.7 ml 41.55 ml/m  AORTIC VALVE                    PULMONIC VALVE AV Area (Vmax):    2.79 cm     PV Vmax:       0.90 m/s  AV Area (Vmean):   2.77 cm     PV Peak grad:  3.2 mmHg AV Area (VTI):     2.27 cm AV Vmax:           117.00 cm/s AV Vmean:          70.200 cm/s AV VTI:            0.251 m AV Peak Grad:      5.5 mmHg AV Mean Grad:      2.0 mmHg LVOT Vmax:         104.00 cm/s LVOT Vmean:        62.000 cm/s LVOT VTI:          0.181 m LVOT/AV VTI ratio: 0.72  AORTA Ao Root diam: 2.90 cm MITRAL VALVE               TRICUSPID VALVE MV Area (PHT): 4.24 cm    TR Peak grad:   33.4 mmHg MV Area VTI:   1.60 cm    TR Vmax:        289.00 cm/s MV Peak grad:  5.1 mmHg MV Mean grad:  2.0 mmHg    SHUNTS MV Vmax:       1.13 m/s    Systemic VTI:  0.18 m MV Vmean:      66.2 cm/s   Systemic Diam: 2.00 cm MV Decel Time: 179 msec MV E velocity: 91.20 cm/s MV A velocity: 93.30 cm/s MV E/A ratio:  0.98 Lonni End MD Electronically signed by Lonni Hanson MD Signature Date/Time: 02/07/2023/2:33:21 PM    Final           Laneta Blunt, DO [facility] 02/11/2023, 11:38 AM    Dictation software may have been used to generate the above note. Typos may occur and escape review in typed/dictated notes. Please contact Dr Blunt directly for clarity if needed.  Staff may message me via secure chat in Epic  but this may not receive an immediate response,  please page me for urgent matters!  If 7PM-7AM, please contact night coverage [email or web address]

## 2023-02-11 NOTE — Progress Notes (Signed)
 ANTICOAGULATION CONSULT NOTE  Pharmacy Consult for heparin  infusion Indication: atrial fibrillation  Allergies  Allergen Reactions   Atorvastatin Cough   Patient Measurements: Height: 5' 2 (157.5 cm) Weight: 58 kg (127 lb 13.9 oz) IBW/kg (Calculated) : 50.1 Heparin  Dosing Weight: 58 kg  Vital Signs: Temp: 98.6 F (37 C) (01/11 0410) Temp Source: Axillary (01/11 0410) BP: 148/67 (01/11 0200) Pulse Rate: 86 (01/11 0312)  Labs: Recent Labs    02/09/23 0634 02/09/23 1418 02/10/23 0334 02/11/23 0422  HGB 10.3*  --  10.8* 11.6*  HCT 29.6*  --  30.8* 34.1*  PLT 313  --  334 325  HEPARINUNFRC 0.53 0.48 0.45 0.72*  CREATININE 0.82  --  0.90 0.83   Estimated Creatinine Clearance: 36.3 mL/min (by C-G formula based on SCr of 0.83 mg/dL).  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 In the ED they were noted to have atrial fibrillation with rapid ventricular response, currently in sinus rhythm. Pharmacy has been consulted for initiation and management of heparin  infusion for atrial fibrillation.   Goal of Therapy:  Heparin  level 0.3-0.7 units/ml Monitor platelets by anticoagulation protocol: Yes  Heparin  Levels Date/Time  HL Clinical Assessment  1/7@0740   0.33 Therapeutic x 1  1/7@1501   0.75 SUPRAtherapeutic   1/8 @ 0228 0.30 Therapeutic x 1  1/8 @ 1104 0.21 Subtherapeutic   1/8 @ 2138 0.17 Subtherapeutic  1/9 @ 0634 0.53 Therapeutic x 1   1/9 @ 1418 0.48 Therapeutic x 2   1/10 0334 0.45 Therapeutic x 3  1/11 0422 0.72 Supratherapeutic   Plan:   Decrease heparin  infusion rate to 1000 units/hr  Recheck HL in 8 hrs after rate change CBC stable; monitor daily and for s/sx of bleeding   Thank you for involving pharmacy in this patient's care.   Rankin CANDIE Dills, PharmD, [facility] 02/11/2023 6:27 AM

## 2023-02-11 NOTE — Progress Notes (Signed)
   02/11/23 0846  Assess: MEWS Score  BP 126/86  MAP (mmHg) 96  Pulse Rate (!) 149  ECG Heart Rate (!) 149  Resp (!) 21  SpO2 95 %  Assess: MEWS Score  MEWS Temp 0  MEWS Systolic 0  MEWS Pulse 3  MEWS RR 1  MEWS LOC 0  MEWS Score 4  MEWS Score Color Red  Assess: if the MEWS score is Yellow or Red  Were vital signs accurate and taken at a resting state? Yes  Does the patient meet 2 or more of the SIRS criteria? Yes  Does the patient have a confirmed or suspected source of infection? Yes  MEWS guidelines implemented  Yes, red  Treat  MEWS Interventions Considered administering scheduled or prn medications/treatments as ordered  Take Vital Signs  Increase Vital Sign Frequency  Red: Q1hr x2, continue Q4hrs until patient remains green for 12hrs  Escalate  MEWS: Escalate Red: Discuss with charge nurse and notify provider. Consider notifying RRT. If remains red for 2 hours consider need for higher level of care  Notify: Charge Nurse/RN  Name of Charge Nurse/RN Notified Laurine, RN  Provider Notification  Provider Name/Title Dr. Marsa  Date Provider Notified 02/11/23  Time Provider Notified 0848  Method of Notification  (secure chat)  Notification Reason Other (Comment);New onset of dysrhythmia (Red MEWS)  Provider response Evaluate remotely  Date of Provider Response 02/11/23  Time of Provider Response [phone]  Assess: SIRS CRITERIA  SIRS Temperature  0  SIRS Respirations  1  SIRS Pulse 1  SIRS WBC 1  SIRS Score Sum  3   Notified Dr. Marsa that patient's EKG shows Afib RVR. Waiting for response.

## 2023-02-11 NOTE — Progress Notes (Signed)
 ANTICOAGULATION CONSULT NOTE  Pharmacy Consult for heparin  infusion Indication: atrial fibrillation  Allergies  Allergen Reactions   Atorvastatin Cough   Patient Measurements: Height: 5' 2 (157.5 cm) Weight: 58 kg (127 lb 13.9 oz) IBW/kg (Calculated) : 50.1 Heparin  Dosing Weight: 58 kg  Vital Signs: Temp: 98.7 F (37.1 C) (01/11 1544) BP: 134/83 (01/11 1900) Pulse Rate: 85 (01/11 2008)  Labs: Recent Labs    02/09/23 0634 02/09/23 1418 02/10/23 0334 02/11/23 0422 02/11/23 1456 02/11/23 2245  HGB 10.3*  --  10.8* 11.6*  --   --   HCT 29.6*  --  30.8* 34.1*  --   --   PLT 313  --  334 325  --   --   HEPARINUNFRC 0.53   < > 0.45 0.72* 0.38 0.59  CREATININE 0.82  --  0.90 0.83  --   --    < > = values in this interval not displayed.   Estimated Creatinine Clearance: 36.3 mL/min (by C-G formula based on SCr of 0.83 mg/dL).  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 In the ED they were noted to have atrial fibrillation with rapid ventricular response, currently in sinus rhythm. Pharmacy has been consulted for initiation and management of heparin  infusion for atrial fibrillation.   Goal of Therapy:  Heparin  level  0.3-0.7 units/ml Monitor platelets by anticoagulation protocol: Yes  Heparin  Levels Date/Time HL Clinical Assessment  1/7@0740   0.33 Therapeutic x 1  1/7@1501   0.75 SUPRAtherapeutic   1/8 @ 0228 0.30 Therapeutic x 1  1/8 @ 1104 0.21 Subtherapeutic   1/8 @ 2138 0.17 Subtherapeutic  1/9 @ 0634 0.53 Therapeutic x 1   1/9 @ 1418 0.48 Therapeutic x 2   1/10 0334 0.45 Therapeutic x 3  1/11 0422 0.72 Supratherapeutic  1/11@1456  0.38 Therapeutic x 1  1/11 2245 0.59 Therapeutic x 2       Plan:   Continue heparin  infusion rate at 1000 units/hr  Recheck HL daily w/ AM labs while therapeutic CBC stable; monitor daily and for s/sx of bleeding   Thank you for involving pharmacy in this patient's care.   Rankin CANDIE Dills, PharmD, [facility] 02/11/2023 11:52 PM

## 2023-02-11 NOTE — Progress Notes (Addendum)
 Called to bedside by RN approximately 08 40, concern for rapid heart rate.  EKG demonstrating A-fib/RVR.  On exam, patient is resting, tachycardic, irregular, tachypneic, SpO2 80s on BiPAP, patient attempting to remove BiPAP despite mitts, when I asked patient if she is in pain or hurting she says yes, when I ask her where she cannot or does not elaborate.   Per RN, daughter recently left bedside.  I called daughter to give an update, let her know that with rapid heart rate, dropping oxygenation, increasing agitation including trying to pull off the BiPAP, I am concerned that we are at a point where all our efforts to treat are not helping and patient is suffering.  Yesterday we had a discussion regarding comfort measures.  I offered that we can certainly attempt to treat the A-fib RVR, but this risks dropping blood pressure, also if patient is not keeping BiPAP on then her oxygenation cannot maintain, I am concerned given EKG changes for ischemia, patient is telling me that she is in pain/hurting but cannot elaborate.  Daughter notes that patient was fine all night and is still asking to wait until Monday to give patient more time to improve.  I mentioned that given decline overnight, I am not optimistic that her organ functions will sustain that long.  I let her know I will try to treat the A-fib/RVR, patient's daughter will be coming to bedside shortly. Ordered cardizem  10 mg IV push, may need to place her back on drip. Still on heparin  gtt, will hold off on troponin levels which are likely to be high given acute illness and would not change management as she is high risk for cardiac intervention / anesthesia.   I spoke w/ Dr Florencio for cardiology and he will review / see patient   Addendum - starting dilt drip  Critical care time 40 min

## 2023-02-11 NOTE — Progress Notes (Addendum)
       CROSS COVER NOTE  NAME: SYNIA DOUGLASS MRN: [id number] DOB : May 25, 1931 ATTENDING PHYSICIAN: Marsa Edelman, DO    Date of Service   02/11/2023   HPI/Events of Note   Patient with worsening resp status, and increasing oxygen requirements up to 13 L HFNC Chart reviewed and noted poor prognosis given age and failure to thrive/poor reserve Patient is DNR DNI  Interventions   Assessment/Plan: BIPAP already ordered prn  Solumedrol added per rec up to  date with severe PNA with worsening hypoxia Chest xray reports Interval worsening of right upper lobe airspace opacity.Likely mild pulmonary edema. Interval development of at least bilateral small, left greater than right, pleural effusions 4  furosemide  40 mg IV x1 ordered -continue rocephin  and azithromycin         Erminio LITTIE Cone NP [facility] Cross Cover 7pm-7am - check amion for availability Pager [phone]

## 2023-02-12 DIAGNOSIS — I4891 Unspecified atrial fibrillation: Secondary | ICD-10-CM | POA: Diagnosis not present

## 2023-02-12 DIAGNOSIS — J189 Pneumonia, unspecified organism: Secondary | ICD-10-CM | POA: Diagnosis not present

## 2023-02-12 LAB — BASIC METABOLIC PANEL
Anion gap: 12 (ref 5–15)
BUN: 41 mg/dL — ABNORMAL HIGH (ref 8–23)
CO2: 34 mmol/L — ABNORMAL HIGH (ref 22–32)
Calcium: 8.9 mg/dL (ref 8.9–10.3)
Chloride: 103 mmol/L (ref 98–111)
Creatinine, Ser: 0.9 mg/dL (ref 0.44–1.00)
GFR, Estimated: >60 mL/min (ref >60)
Glucose, Bld: 138 mg/dL — ABNORMAL HIGH (ref 70–99)
Potassium: 3.4 mmol/L — ABNORMAL LOW (ref 3.5–5.1)
Sodium: 149 mmol/L — ABNORMAL HIGH (ref 135–145)

## 2023-02-12 LAB — CBC
HCT: 32.3 % — ABNORMAL LOW (ref 36.0–46.0)
Hemoglobin: 11.1 g/dL — ABNORMAL LOW (ref 12.0–15.0)
MCH: 31.3 pg (ref 26.0–34.0)
MCHC: 34.4 g/dL (ref 30.0–36.0)
MCV: 91 fL (ref 80.0–100.0)
Platelets: 309 10*3/uL (ref 150–400)
RBC: 3.55 MIL/uL — ABNORMAL LOW (ref 3.87–5.11)
RDW: 15 % (ref 11.5–15.5)
WBC: 21.3 10*3/uL — ABNORMAL HIGH (ref 4.0–10.5)
nRBC: 0 % (ref 0.0–0.2)

## 2023-02-12 LAB — HEMOGLOBIN A1C
Hgb A1c MFr Bld: 6.7 % — ABNORMAL HIGH (ref 4.8–5.6)
Mean Plasma Glucose: 145.59 mg/dL

## 2023-02-12 LAB — HEPARIN LEVEL (UNFRACTIONATED): Heparin Unfractionated: 0.67 [IU]/mL (ref 0.30–0.70)

## 2023-02-12 LAB — GLUCOSE, CAPILLARY
Glucose-Capillary: 306 mg/dL — ABNORMAL HIGH (ref 70–99)
Glucose-Capillary: 171 mg/dL — ABNORMAL HIGH (ref 70–99)
Glucose-Capillary: 178 mg/dL — ABNORMAL HIGH (ref 70–99)
Glucose-Capillary: 273 mg/dL — ABNORMAL HIGH (ref 70–99)
Glucose-Capillary: 219 mg/dL — ABNORMAL HIGH (ref 70–99)

## 2023-02-12 MED ORDER — AMIODARONE HCL 200 MG PO TABS: 200.0000 mg | ORAL_TABLET | Freq: Every day | ORAL | Status: DC

## 2023-02-12 MED ORDER — INSULIN ASPART 100 UNIT/ML IJ SOLN
0.0000 [IU] | INTRAMUSCULAR | Status: DC
  Administered 2023-02-12: 2 [IU] via SUBCUTANEOUS
  Administered 2023-02-12: 3 [IU] via SUBCUTANEOUS
  Administered 2023-02-12: 7 [IU] via SUBCUTANEOUS
  Administered 2023-02-12: 5 [IU] via SUBCUTANEOUS
  Administered 2023-02-13: 1 [IU] via SUBCUTANEOUS
  Administered 2023-02-13: 3 [IU] via SUBCUTANEOUS
  Administered 2023-02-13 (×2): 2 [IU] via SUBCUTANEOUS
  Filled 2023-02-12 (×8): qty 1

## 2023-02-12 MED ORDER — POTASSIUM CHLORIDE 10 MEQ/100ML IV SOLN
10.0000 meq | INTRAVENOUS | Status: AC
  Administered 2023-02-12 (×4): 10 meq via INTRAVENOUS
  Filled 2023-02-12 (×4): qty 100

## 2023-02-12 MED ORDER — INSULIN ASPART 100 UNIT/ML IJ SOLN: 0.0000 [IU] | INTRAMUSCULAR | Status: DC

## 2023-02-12 MED ORDER — AMIODARONE HCL 200 MG PO TABS
200.0000 mg | ORAL_TABLET | Freq: Two times a day (BID) | ORAL | Status: DC
  Administered 2023-02-12 (×2): 200 mg via ORAL
  Filled 2023-02-12 (×2): qty 1

## 2023-02-12 NOTE — Progress Notes (Signed)
 ANTICOAGULATION CONSULT NOTE  Pharmacy Consult for heparin  infusion Indication: atrial fibrillation  Allergies  Allergen Reactions   Atorvastatin Cough   Patient Measurements: Height: 5' 2 (157.5 cm) Weight: 54.4 kg (119 lb 14.9 oz) IBW/kg (Calculated) : 50.1 Heparin  Dosing Weight: 58 kg  Vital Signs: Temp: 98.4 F (36.9 C) (01/12 0758) Temp Source: Axillary (01/12 0758) BP: 139/72 (01/12 0758) Pulse Rate: 86 (01/12 0900)  Labs: Recent Labs    02/10/23 0334 02/11/23 0422 02/11/23 1456 02/11/23 2245 02/12/23 0912  HGB 10.8* 11.6*  --   --  11.1*  HCT 30.8* 34.1*  --   --  32.3*  PLT 334 325  --   --  309  HEPARINUNFRC 0.45 0.72* 0.38 0.59  --   CREATININE 0.90 0.83  --   --  0.90   Estimated Creatinine Clearance: 33.5 mL/min (by C-G formula based on SCr of 0.9 mg/dL).  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 In the ED they were noted to have atrial fibrillation with rapid ventricular response, currently in sinus rhythm. Pharmacy has been consulted for initiation and management of heparin  infusion for atrial fibrillation. Hgb stable (seems to be baseline)  Goal of Therapy:  Heparin  level 0.3-0.7  units/ml Monitor platelets by anticoagulation protocol: Yes  Heparin  Levels Date/Time HL Clinical Assessment  1/7@0740   0.33 Therapeutic x 1  1/7@1501   0.75 SUPRAtherapeutic   1/8 @ 0228 0.30 Therapeutic x 1  1/8 @ 1104 0.21 Subtherapeutic   1/8 @ 2138 0.17 Subtherapeutic  1/9 @ 0634 0.53 Therapeutic x 1   1/9 @ 1418 0.48 Therapeutic x 2   1/10 0334 0.45 Therapeutic x 3  1/11 0422 0.72 Supratherapeutic  1/11@1456  0.38 Therapeutic x 1  1/11 2245 0.59 Therapeutic x 2  1/12 0912 0.67 Called lab to get result   Plan:   Heparin  level is therapeutic. Will continue heparin  infusion at 1000 units. Recheck heparin  level and CBC with AM labs.   Thank you for involving pharmacy in this patient's care.   Cathaleen Blanch, PharmD, BCPS 02/12/2023 10:51 AM

## 2023-02-12 NOTE — Progress Notes (Signed)
 Speech Language Pathology Treatment: Dysphagia  Patient Details Name: Lisa Martin MRN: [id number] DOB: [birth year] Today's Date: 02/12/2023 Time: 1430-1510 SLP Time Calculation (min) (ACUTE ONLY): 40 min  Assessment / Plan / Recommendation Clinical Impression  Pt seen for ongoing assessment of swallowing; toleration of diet. Eyes closed the majority of session -- Mitts on hands. Able to respond intermittently to few basic questions but much less engaged than at evaluation. Noted pt has Not changed much in presentation; the declined engagement and overall presentation appears to be continuing. Baseline Dementia. MOD++ cues required for engagement. NSG reported pt has been taking bites/sips at meals, meds too. On [place] O2 8L; afebrile. O2 sats 98%. WBC elevated as at eval. Mild congested cough(effort) at Baseline PRIOR to po's given -- when moving up in bed.   Pt explained general aspiration precautions w/ no true verbal response/acknowledgement. Pt fully assisted w/ positioning d/t weakness. She was given trials of purees and Nectar liquids via spoon. Trials of increased texture and thin liquids were NOT attempted d/t Cognitive/State presentation and Pulmonary presentation.  No immediate, overt clinical s/s of aspiration were noted w/ consistencies; respiratory status remained at its Baseline. Vocal quality clear b/t trials when she spoke/responded; no immediate cough. Oral phase appeared grossly [facility] for bolus management and timely A-P transfer for swallowing; min slower oral clearing noted intermittently w/ consistencies -- suspect d/t decreased attention to task, eyes closed/disengaged slightly. NSG denied any deficits in swallowing crushed Pills -- she tolerated such w/ NSG during session.    Due to pt's declined engagement and attention during po tasks, as well as her declined Medical and Pulmonary status baseline, her risk for aspiration/aspiration pneumonia is increased during oral intake. Recommend  continuing Nectar liquids in diet w/ a more Pureed diet consistency vs Minced, in order to lessen effort w/ task. Gravies added to moisten foods. Recommend aspiration precautions; Pills CRUSHED in Puree; tray setup and positioning assistance for meals. Feeding assistance at meals = only given po's when fully alert and engaged in task.  ST services will continue to monitor status and provide education to family as needed. NO diet upgrade is recommended during this admit d/t the risk for aspiration in setting of declined presentation overall. Discussed w/ MD post session; recommend Palliative Care discussion of GOC. Suspect pt's Cognitive decline and Baseline Dementia will impact swallowing and overall intake increasing risk for aspiration/aspiration pneumonia and hamper upgrade of diet consistency. MD to discuss GOC w/ Daughter.  NSG updated. Precautions posted at bedside.     HPI HPI: Pt is an 89 year old woman admitted with concerns for pneumonia and sepsis complicated by paroxysmal atrial fibrillation with rapid ventricular response. She presented w/ tachypnea and hypoxia. PMH includes: HTN, dCHF, hypothyroidism, depression with anxiety, GERD, breast cancer (s/p of radiation and chemotherapy), protein calorie malnutrition, prior falls with resulting hip fracture, rib fractures, with most recent fall 07/2022 with pelvic fracture treated nonoperatively, resides at a memory care unit.   CT of Chest:  No PE.  Interlobular septal thickening and patchy ground-glass opacities  with bibasilar atelectasis or infiltrates. Findings favor atypical  infection.  3. Geographic hypoattenuation in the cortex of the left kidney may  be due to streak artifact and motion versus pyelonephritis.  Correlate with urinalysis.  4. Subacute appearing left posterior 10th-11th rib fractures.  5. Extensive colonic diverticulosis without evidence of  diverticulitis.      SLP Plan  Continue with current plan of care (monitor for any  further education needs)  Recommendations for follow up therapy are one component of a multi-disciplinary discharge planning process, led by the attending physician.  Recommendations may be updated based on patient status, additional functional criteria and insurance authorization.    Recommendations  Diet recommendations: Dysphagia 1 (puree);Nectar-thick liquid Liquids provided via: Teaspoon;Cup Medication Administration: Crushed with puree Supervision: Staff to assist with self feeding;Full supervision/cueing for compensatory strategies Compensations: Minimize environmental distractions;Slow rate;Small sips/bites;Follow solids with liquid Postural Changes and/or Swallow Maneuvers: Out of bed for meals;Seated upright 90 degrees;Upright 30-60 min after meal                 (Palliative Care f/u for GOC) Oral care BID;Oral care before and after PO;Staff/trained caregiver to provide oral care   Frequent or constant Supervision/Assistance Dysphagia, oropharyngeal phase (R13.12) (acute illness and Dementia/confusion; deconditioned)     Continue with current plan of care (monitor for any further education needs)       Comer Portugal, MS, CCC-SLP Speech Language Pathologist Rehab Services; [facility] [phone] (ascom) Haizlee Henton  02/12/2023, 3:13 PM

## 2023-02-12 NOTE — Progress Notes (Signed)
   Patient Name: Lisa Martin Date of Encounter: 02/12/2023 [facility] Cardiologist: Lonni Hanson, MD   Interval Summary  .    Sleepy.   Vital Signs .    Vitals:   02/12/23 0758 02/12/23 0829 02/12/23 0900 02/12/23 1100  BP: 139/72     Pulse:  93 86 86  Resp:  (!) 30 (!) 22 (!) 22  Temp: 98.4 F (36.9 C)     TempSrc: Axillary     SpO2:  98% 99% 100%  Weight:      Height:        Intake/Output Summary (Last 24 hours) at 02/12/2023 1355 Last data filed at 02/12/2023 0700 Gross per 24 hour  Intake 1731.8 ml  Output 350 ml  Net 1381.8 ml      02/12/2023    5:45 AM 02/12/2023    4:45 AM 02/06/2023   11:11 PM  Last 3 Weights  Weight (lbs) 119 lb 14.9 oz 118 lb 6.2 oz 127 lb 13.9 oz  Weight (kg) 54.4 kg 53.7 kg 58 kg      Telemetry/ECG    Sinus rhythm.- Personally Reviewed  Physical Exam .    VS:  BP 139/72 (BP Location: Left Arm)   Pulse 86   Temp 98.4 F (36.9 C) (Axillary)   Resp (!) 22   Ht 5' 2 (1.575 m)   Wt 54.4 kg   SpO2 100%   BMI 21.94 kg/m  , BMI Body mass index is 21.94 kg/m. GENERAL: Frail.  No acute distress. HEENT: Pupils equal round and reactive, fundi not visualized, oral mucosa unremarkable NECK:  No jugular venous distention, waveform within normal limits, carotid upstroke brisk and symmetric, no bruits, no thyromegaly LUNGS:  Clear to auscultation bilaterally HEART: Tachycardic.  Irregularly irregular.  PMI not displaced or sustained,S1 and S2 within normal limits, no S3, no S4, no clicks, no rubs, no murmurs ABD:  Flat, positive bowel sounds normal in frequency in pitch, no bruits, no rebound, no guarding, no midline pulsatile mass, no hepatomegaly, no splenomegaly EXT:  2 plus pulses throughout, no edema, no cyanosis no clubbing SKIN:  No rashes no nodules NEURO:  Cranial nerves II through XII grossly intact, motor grossly intact throughout PSYCH:  Cognitively intact, oriented to person place and time   Assessment & Plan .      89 year old female with advanced dementia, breast cancer, recurrent falls status post hip and rib fractures, hypertension, hypothyroidism, depression, and anxiety admitted with pneumonia and sepsis.  Cardiology consulted for atrial fibrillation with rapid ventricular response.  # Atrial fibrillation with rapid ventricular response: Patient converted back to sinus rhythm.  She is not a candidate for anticoagulation due to frequent falls and fractures.  Will transition amiodarone  to 200mg  bid x10 days then 200mg  daily.  She was hypotensive when on diltiazem  infusion.    For questions or updates, please contact [facility] Please consult [email or web address] for contact info under     Signed, Annabella Scarce, MD

## 2023-02-12 NOTE — Progress Notes (Signed)
       CROSS COVER NOTE  NAME: Lisa Martin MRN: [id number] DOB : 08-03-1931 ATTENDING PHYSICIAN: Marsa Edelman, DO    Date of Service   02/12/2023   HPI/Events of Note   Blood sugar monitoring every 4 h reported now at 360   Interventions   Assessment/Plan: Cbg review, meets criteria for diabetes  Sensitive sliding scale Hgb a1c        Erminio LITTIE Cone NP [facility] Cross Cover 7pm-7am - check amion for availability Pager [phone]

## 2023-02-12 NOTE — Progress Notes (Addendum)
 PROGRESS NOTE    Lisa Martin   FMW:[id number] DOB: 20-Jul-1931  DOA: 02/06/2023 Date of Service: 02/12/23 which is hospital day 5  PCP: Cleotilde Oneil FALCON, MD    Hospital course / significant events:   HPI: Lisa Martin is a 89 y.o. female with medical history significant for advanced dementia,  HTN, dCHF, hypothyroidism, depression with anxiety, GERD, breast cancer (s/p of radiation and chemotherapy), protein calorie malnutrition, prior falls with resulting hip fracture, rib fractures, with most recent fall 07/2022 with pelvic fracture treated nonoperatively, who presents from memory care unit with concerns for tachycardia and tachypnea with hypoxia to 89% requiring 6 L by EMS to maintain sats in the mid 90s.  She was also tachycardic to 170.  Patient unable to contribute to history due to dementia.   01/06: admitted to hospitalist service for sepsis d/t pneumonia, (+)Rhinovirus, AfibRVR 01/07: converted to sinus rhythm. Echo w/ preserved EF Grade 1 diast df  01/08: titrated po diltiazem  ER 120 mg daily. Tachypneic, not tolerating BiPap 01/09-01/10: sleepy but easily awoken, confused/disoriented, tachypneic but not in serious distress, not eating. Consider for palliative if not improving. Not responding to cues for po intake. See IPAL note.  01/11: more hypoxic overnight required BiPap but this caused agitation. Into Afib RVR this am - see note. D/w daughter, will continue tx as able, dilt IVx1 did not convert, placed on dilt gtt and cardiology consult. Pt able to come off BiPap and more calm mid-day. Closely monitoring 01/12: stable but still very sleepy, not engaging w/ SLP for feeding, will not meet nutrition needs. Amiodarone  converted to po. Off drips today in sinus rhythm. Slightly worsening hypernatremia and WBC, monitoring      Consultants:  Cardiology   Procedures/Surgeries: none      ASSESSMENT & PLAN:   CAP Rhinovirus Sepsis d/t pneumonia  Suspect aspiration pneumonitis   Worsening appearance on CXR question d/t IV fluids vs aspiration vs progressive disease  ceftriaxone /azithromycin  Prn ipratropium breathing treatments. Holding on albuterol  given a-fib   Acute hypoxic respiratory failure - stabilized  2/2 pneumonia.  BiPAP/CPAP prn D/c IV fluids and restart steroids d/t worsening lung infiltrate   A-fib RVR, converted to sinus now  continue heparin  Amiodarone  given low BP w/ diltiazem   Cardiology following    Right foot pain No erythema. X-rays no fracture or other acute process monitor   Hypothyroid Tsh wnl cont home levothyroxine    Advanced alzheimer's dementia cont home memantine , seroquel , rivastigmine  Affecting swallow functioning GOC discussions ongoing given overall declining function   MDD home sertraline      DVT prophylaxis: heparin  IV fluids: no continuous IV fluids  Nutrition: dysphagia, low PO intake  Central lines / invasive devices: none  Code Status: DNR ACP documentation reviewed:  none on file in [facility]  TOC needs: TBD Barriers to dispo / significant pending items: clinical illness              Subjective / Brief ROS:  Patient this morning was sleepy but rousable,states I need to catch up but does not elaborate, is not answering any questions.    Family Communication: spoke w/ daughter at bedside this evening. She is feeding her mom and patient is eating better, still not really talking. Daughter notes that pt typically sits in chair all day and doesn't talk or interact much. Daughter is amenable to further discussion w/ hospice, and she is encouraged that patient is doing better in her estimation.     Objective Findings:  Vitals:   02/12/23 0829 02/12/23 0900 02/12/23 1100 02/12/23 1554  BP:      Pulse: 93 86 86   Resp: (!) 30 (!) 22 (!) 22   Temp:    99.2 F (37.3 C)  TempSrc:    Axillary  SpO2: 98% 99% 100%   Weight:      Height:        Intake/Output Summary (Last 24 hours) at  02/12/2023 1603 Last data filed at 02/12/2023 1551 Gross per 24 hour  Intake 1451.78 ml  Output 350 ml  Net 1101.78 ml   Filed Weights   02/06/23 2311 02/12/23 0445 02/12/23 0545  Weight: 58 kg 53.7 kg 54.4 kg    Examination:  Physical Exam Constitutional:      General: She is not in acute distress (better on reexamination mid-day, definitely more distress earlier this mornin).    Appearance: She is ill-appearing.  Pulmonary:     Effort: Tachypnea present. No respiratory distress.     Breath sounds: Decreased breath sounds and wheezing present.  Musculoskeletal:     Right lower leg: No edema.     Left lower leg: No edema.  Skin:    General: Skin is warm and dry.  Neurological:     Mental Status: She is disoriented.  Psychiatric:        Behavior: Behavior normal.          Scheduled Medications:   amiodarone   200 mg Oral BID   [START ON 02/22/2023] amiodarone   200 mg Oral Daily   insulin  aspart  0-9 Units Subcutaneous Q4H   levothyroxine   125 mcg Oral Q0600   memantine   5 mg Oral BID   methylPREDNISolone  (SOLU-MEDROL ) injection  40 mg Intravenous Q12H   sertraline   25 mg Oral Daily    Continuous Infusions:  diltiazem  (CARDIZEM ) infusion Stopped (02/11/23 1446)   heparin  Stopped (02/12/23 1510)   potassium chloride  10 mEq (02/12/23 1551)    PRN Medications:  acetaminophen  **OR** acetaminophen , haloperidol  lactate, ondansetron  **OR** ondansetron  (ZOFRAN ) IV  Antimicrobials from admission:  Anti-infectives (From admission, onward)    Start     Dose/Rate Route Frequency Ordered Stop   02/11/23 1230  azithromycin  (ZITHROMAX ) 500 mg in sodium chloride  0.9 % 250 mL IVPB        500 mg 250 mL/hr over 60 Minutes Intravenous  Once 02/11/23 1136 02/11/23 1447   02/10/23 0600  azithromycin  (ZITHROMAX ) tablet 500 mg  Status:  Discontinued        500 mg Oral Daily 02/09/23 1429 02/11/23 1136   02/07/23 1200  cefTRIAXone  (ROCEPHIN ) 2 g in sodium chloride  0.9 % 100 mL IVPB         2 g 200 mL/hr over 30 Minutes Intravenous Every 24 hours 02/07/23 0147 02/11/23 1237   02/07/23 0600  azithromycin  (ZITHROMAX ) 500 mg in sodium chloride  0.9 % 250 mL IVPB  Status:  Discontinued        500 mg 250 mL/hr over 60 Minutes Intravenous Every 24 hours 02/07/23 0147 02/09/23 1429   02/07/23 0000  vancomycin  (VANCOREADY) IVPB 1250 mg/250 mL        1,250 mg 166.7 mL/hr over 90 Minutes Intravenous  Once 02/06/23 2351 02/07/23 0450   02/06/23 2345  ceFEPIme  (MAXIPIME ) 2 g in sodium chloride  0.9 % 100 mL IVPB        2 g 200 mL/hr over 30 Minutes Intravenous  Once 02/06/23 2337 02/07/23 0030  Data Reviewed:  I have personally reviewed the following...  CBC: Recent Labs  Lab 02/06/23 2314 02/08/23 0510 02/09/23 0634 02/10/23 0334 02/11/23 0422 02/12/23 0912  WBC 22.9* 20.1* 23.1* 21.2* 18.8* 21.3*  NEUTROABS 20.7*  --   --   --   --   --   HGB 11.7* 10.8* 10.3* 10.8* 11.6* 11.1*  HCT 33.8* 31.4* 29.6* 30.8* 34.1* 32.3*  MCV 90.4 90.0 89.7 88.5 92.2 91.0  PLT 354 326 313 334 325 309   Basic Metabolic Panel: Recent Labs  Lab 02/08/23 0510 02/09/23 0634 02/10/23 0334 02/11/23 0422 02/12/23 0912  NA 142 142 144 147* 149*  K 4.0 3.7 3.3* 3.1* 3.4*  CL 110 106 105 104 103  CO2 20* 24 27 31  34*  GLUCOSE 158* 191* 187* 227* 138*  BUN 16 26* 30* 31* 41*  CREATININE 0.64 0.82 0.90 0.83 0.90  CALCIUM 8.8* 8.9 9.0 8.7* 8.9   GFR: Estimated Creatinine Clearance: 33.5 mL/min (by C-G formula based on SCr of 0.9 mg/dL). Liver Function Tests: Recent Labs  Lab 02/06/23 2314  AST 21  ALT 20  ALKPHOS 71  BILITOT 1.1  PROT 7.3  ALBUMIN 3.7   No results for input(s): LIPASE, AMYLASE in the last 168 hours. No results for input(s): AMMONIA in the last 168 hours. Coagulation Profile: Recent Labs  Lab 02/06/23 2314  INR 1.3*   Cardiac Enzymes: No results for input(s): CKTOTAL, CKMB, CKMBINDEX, TROPONINI in the last 168 hours. BNP  (last 3 results) No results for input(s): PROBNP in the last 8760 hours. HbA1C: No results for input(s): HGBA1C in the last 72 hours. CBG: Recent Labs  Lab 02/11/23 2042 02/12/23 0352 02/12/23 0754 02/12/23 1136 02/12/23 1543  GLUCAP 291* 306* 171* 178* 273*   Lipid Profile: No results for input(s): CHOL, HDL, LDLCALC, TRIG, CHOLHDL, LDLDIRECT in the last 72 hours. Thyroid  Function Tests: No results for input(s): TSH, T4TOTAL, FREET4, T3FREE, THYROIDAB in the last 72 hours.  Anemia Panel: No results for input(s): VITAMINB12, FOLATE, FERRITIN, TIBC, IRON, RETICCTPCT in the last 72 hours. Most Recent Urinalysis On File:     Component Value Date/Time   COLORURINE YELLOW (A) 02/07/2023 0654   APPEARANCEUR CLEAR (A) 02/07/2023 0654   LABSPEC >1.046 (H) 02/07/2023 0654   PHURINE 5.0 02/07/2023 0654   GLUCOSEU NEGATIVE 02/07/2023 0654   HGBUR NEGATIVE 02/07/2023 0654   BILIRUBINUR NEGATIVE 02/07/2023 0654   KETONESUR NEGATIVE 02/07/2023 0654   PROTEINUR NEGATIVE 02/07/2023 0654   NITRITE NEGATIVE 02/07/2023 0654   LEUKOCYTESUR NEGATIVE 02/07/2023 0654   Sepsis Labs: @LABRCNTIP (procalcitonin:4,lacticidven:4) Microbiology: Recent Results (from the past 240 hours)  Blood Culture (routine x 2)     Status: None   Collection Time: 02/06/23 11:14 PM   Specimen: BLOOD  Result Value Ref Range Status   Specimen Description BLOOD BLOOD RIGHT WRIST  Final   Special Requests   Final    BOTTLES DRAWN AEROBIC AND ANAEROBIC Blood Culture adequate volume   Culture   Final    NO GROWTH 5 DAYS Performed at [facility], [address], [place], [place] [address]    Report Status 02/11/2023 FINAL  Final  Blood Culture (routine x 2)     Status: None   Collection Time: 02/06/23 11:14 PM   Specimen: BLOOD  Result Value Ref Range Status   Specimen Description BLOOD BLOOD LEFT FOREARM  Final   Special Requests   Final    BOTTLES DRAWN AEROBIC  AND ANAEROBIC Blood Culture  adequate volume   Culture   Final    NO GROWTH 5 DAYS Performed at [facility], [address] [place], [place] [address]    Report Status 02/11/2023 FINAL  Final  Resp panel by RT-PCR (RSV, Flu A&B, Covid) Anterior Nasal Swab     Status: None   Collection Time: 02/06/23 11:35 PM   Specimen: Anterior Nasal Swab  Result Value Ref Range Status   SARS Coronavirus 2 by RT PCR NEGATIVE NEGATIVE Final    Comment: (NOTE) SARS-CoV-2 target nucleic acids are NOT DETECTED.  The SARS-CoV-2 RNA is generally detectable in upper respiratory specimens during the acute phase of infection. The lowest concentration of SARS-CoV-2 viral copies this assay can detect is 138 copies/mL. A negative result does not preclude SARS-Cov-2 infection and should not be used as the sole basis for treatment or other patient management decisions. A negative result may occur with  improper specimen collection/handling, submission of specimen other than nasopharyngeal swab, presence of viral mutation(s) within the areas targeted by this assay, and inadequate number of viral copies(<138 copies/mL). A negative result must be combined with clinical observations, patient history, and epidemiological information. The expected result is Negative.  Fact Sheet for Patients:  bloggercourse.com  Fact Sheet for Healthcare Providers:  seriousbroker.it  This test is no t yet approved or cleared by the [place]  FDA and  has been authorized for detection and/or diagnosis of SARS-CoV-2 by FDA under an Emergency Use Authorization (EUA). This EUA will remain  in effect (meaning this test can be used) for the duration of the COVID-19 declaration under Section 564(b)(1) of the Act, 21 U.S.C.section 360bbb-3(b)(1), unless the authorization is terminated  or revoked sooner.       Influenza A by PCR NEGATIVE NEGATIVE Final   Influenza B by  PCR NEGATIVE NEGATIVE Final    Comment: (NOTE) The Xpert Xpress SARS-CoV-2/FLU/RSV plus assay is intended as an aid in the diagnosis of influenza from Nasopharyngeal swab specimens and should not be used as a sole basis for treatment. Nasal washings and aspirates are unacceptable for Xpert Xpress SARS-CoV-2/FLU/RSV testing.  Fact Sheet for Patients: bloggercourse.com  Fact Sheet for Healthcare Providers: seriousbroker.it  This test is not yet approved or cleared by the [place]  FDA and has been authorized for detection and/or diagnosis of SARS-CoV-2 by FDA under an Emergency Use Authorization (EUA). This EUA will remain in effect (meaning this test can be used) for the duration of the COVID-19 declaration under Section 564(b)(1) of the Act, 21 U.S.C. section 360bbb-3(b)(1), unless the authorization is terminated or revoked.     Resp Syncytial Virus by PCR NEGATIVE NEGATIVE Final    Comment: (NOTE) Fact Sheet for Patients: bloggercourse.com  Fact Sheet for Healthcare Providers: seriousbroker.it  This test is not yet approved or cleared by the [place]  FDA and has been authorized for detection and/or diagnosis of SARS-CoV-2 by FDA under an Emergency Use Authorization (EUA). This EUA will remain in effect (meaning this test can be used) for the duration of the COVID-19 declaration under Section 564(b)(1) of the Act, 21 U.S.C. section 360bbb-3(b)(1), unless the authorization is terminated or revoked.  Performed at [facility], [address] [place], [place] [address]   Respiratory (~20 pathogens) panel by PCR     Status: Abnormal   Collection Time: 02/07/23  9:21 AM   Specimen: Nasopharyngeal Swab; Respiratory  Result Value Ref Range Status   Adenovirus NOT DETECTED NOT DETECTED Final   Coronavirus 229E NOT  DETECTED NOT DETECTED Final    Comment:  (NOTE) The Coronavirus on the Respiratory Panel, DOES NOT test for the novel  Coronavirus (2019 nCoV)    Coronavirus HKU1 NOT DETECTED NOT DETECTED Final   Coronavirus NL63 NOT DETECTED NOT DETECTED Final   Coronavirus OC43 NOT DETECTED NOT DETECTED Final   Metapneumovirus NOT DETECTED NOT DETECTED Final   Rhinovirus / Enterovirus DETECTED (A) NOT DETECTED Final   Influenza A NOT DETECTED NOT DETECTED Final   Influenza B NOT DETECTED NOT DETECTED Final   Parainfluenza Virus 1 NOT DETECTED NOT DETECTED Final   Parainfluenza Virus 2 NOT DETECTED NOT DETECTED Final   Parainfluenza Virus 3 NOT DETECTED NOT DETECTED Final   Parainfluenza Virus 4 NOT DETECTED NOT DETECTED Final   Respiratory Syncytial Virus NOT DETECTED NOT DETECTED Final   Bordetella pertussis NOT DETECTED NOT DETECTED Final   Bordetella Parapertussis NOT DETECTED NOT DETECTED Final   Chlamydophila pneumoniae NOT DETECTED NOT DETECTED Final   Mycoplasma pneumoniae NOT DETECTED NOT DETECTED Final    Comment: Performed at [facility], [address] [place], [place] [address]  MRSA Next Gen by PCR, Nasal     Status: None   Collection Time: 02/07/23 10:14 AM   Specimen: Nasal Mucosa; Nasal Swab  Result Value Ref Range Status   MRSA by PCR Next Gen NOT DETECTED NOT DETECTED Final    Comment: (NOTE) The GeneXpert MRSA Assay (FDA approved for NASAL specimens only), is one component of a comprehensive MRSA colonization surveillance program. It is not intended to diagnose MRSA infection nor to guide or monitor treatment for MRSA infections. Test performance is not FDA approved in patients less than 66 years old. Performed at [facility], [address], [place], [place] [address]       Radiology Studies last 3 days: [facility] 1 View Result Date: 02/11/2023 CLINICAL DATA:  36304 Hypoxemia 36304 EXAM: PORTABLE CHEST 1 VIEW COMPARISON:  Chest x-ray 02/06/2023, CT chest 02/07/2023 FINDINGS: Patient is  rotated. The heart and mediastinal contours are unchanged. Atherosclerotic plaque. Interval worsening of right upper lobe airspace opacity. Chronic coarsened interstitial markings with likely superimposed mild pulmonary edema. Interval development of at least bilateral small, left greater than right, pleural effusions. No pneumothorax. No acute osseous abnormality. Thoracic kyphoplasty. Thoracolumbar surgical hardware corpectomy. Multiple old healed left rib fractures. IMPRESSION: 1. Interval worsening of right upper lobe airspace opacity. 2. Likely mild pulmonary edema. 3. Interval development of at least bilateral small, left greater than right, pleural effusions. Electronically Signed   By: Morgane  Naveau M.D.   On: 02/11/2023 01:47          Conrado Nance, DO [facility] 02/12/2023, 4:03 PM    Dictation software may have been used to generate the above note. Typos may occur and escape review in typed/dictated notes. Please contact Dr Marsa directly for clarity if needed.  Staff may message me via secure chat in Epic  but this may not receive an immediate response,  please page me for urgent matters!  If 7PM-7AM, please contact night coverage [email or web address]

## 2023-02-13 ENCOUNTER — Inpatient Hospital Stay: Payer: PPO

## 2023-02-13 DIAGNOSIS — J189 Pneumonia, unspecified organism: Secondary | ICD-10-CM | POA: Diagnosis not present

## 2023-02-13 DIAGNOSIS — Z515 Encounter for palliative care: Secondary | ICD-10-CM | POA: Diagnosis not present

## 2023-02-13 DIAGNOSIS — I4891 Unspecified atrial fibrillation: Secondary | ICD-10-CM | POA: Diagnosis not present

## 2023-02-13 DIAGNOSIS — J9601 Acute respiratory failure with hypoxia: Secondary | ICD-10-CM | POA: Diagnosis not present

## 2023-02-13 LAB — URINALYSIS, ROUTINE W REFLEX MICROSCOPIC
Bilirubin Urine: NEGATIVE
Glucose, UA: NEGATIVE mg/dL
Hgb urine dipstick: NEGATIVE
Ketones, ur: NEGATIVE mg/dL
Leukocytes,Ua: NEGATIVE
Nitrite: NEGATIVE
Protein, ur: 100 mg/dL — AB
Specific Gravity, Urine: >1.046 — ABNORMAL HIGH (ref 1.005–1.030)
pH: 5 (ref 5.0–8.0)

## 2023-02-13 LAB — BASIC METABOLIC PANEL
Anion gap: 12 (ref 5–15)
BUN: 39 mg/dL — ABNORMAL HIGH (ref 8–23)
CO2: 34 mmol/L — ABNORMAL HIGH (ref 22–32)
Calcium: 8.5 mg/dL — ABNORMAL LOW (ref 8.9–10.3)
Chloride: 101 mmol/L (ref 98–111)
Creatinine, Ser: 0.8 mg/dL (ref 0.44–1.00)
GFR, Estimated: >60 mL/min (ref >60)
Glucose, Bld: 211 mg/dL — ABNORMAL HIGH (ref 70–99)
Potassium: 4.3 mmol/L (ref 3.5–5.1)
Sodium: 147 mmol/L — ABNORMAL HIGH (ref 135–145)

## 2023-02-13 LAB — MAGNESIUM: Magnesium: 2.6 mg/dL — ABNORMAL HIGH (ref 1.7–2.4)

## 2023-02-13 LAB — GLUCOSE, CAPILLARY
Glucose-Capillary: 108 mg/dL — ABNORMAL HIGH (ref 70–99)
Glucose-Capillary: 138 mg/dL — ABNORMAL HIGH (ref 70–99)
Glucose-Capillary: 179 mg/dL — ABNORMAL HIGH (ref 70–99)
Glucose-Capillary: 199 mg/dL — ABNORMAL HIGH (ref 70–99)
Glucose-Capillary: 212 mg/dL — ABNORMAL HIGH (ref 70–99)

## 2023-02-13 LAB — CBC
HCT: 35.3 % — ABNORMAL LOW (ref 36.0–46.0)
Hemoglobin: 11.8 g/dL — ABNORMAL LOW (ref 12.0–15.0)
MCH: 31.1 pg (ref 26.0–34.0)
MCHC: 33.4 g/dL (ref 30.0–36.0)
MCV: 93.1 fL (ref 80.0–100.0)
Platelets: 299 10*3/uL (ref 150–400)
RBC: 3.79 MIL/uL — ABNORMAL LOW (ref 3.87–5.11)
RDW: 15.2 % (ref 11.5–15.5)
WBC: 22.2 10*3/uL — ABNORMAL HIGH (ref 4.0–10.5)
nRBC: 0.1 % (ref 0.0–0.2)

## 2023-02-13 LAB — HEPARIN LEVEL (UNFRACTIONATED)
Heparin Unfractionated: 0.89 [IU]/mL — ABNORMAL HIGH (ref 0.30–0.70)
Heparin Unfractionated: 0.76 [IU]/mL — ABNORMAL HIGH (ref 0.30–0.70)

## 2023-02-13 MED ORDER — AMIODARONE HCL IN DEXTROSE 360-4.14 MG/200ML-% IV SOLN
60.0000 mg/h | INTRAVENOUS | Status: AC
  Administered 2023-02-13 (×2): 60 mg/h via INTRAVENOUS
  Filled 2023-02-13: qty 400

## 2023-02-13 MED ORDER — SODIUM CHLORIDE 0.9 % IV SOLN: INTRAVENOUS | Status: DC

## 2023-02-13 MED ORDER — MORPHINE 100MG IN NS 100ML (1MG/ML) PREMIX INFUSION
0.0000 mg/h | INTRAVENOUS | Status: DC
  Administered 2023-02-13: 5 mg/h via INTRAVENOUS
  Filled 2023-02-13: qty 100

## 2023-02-13 MED ORDER — ACETAMINOPHEN 650 MG RE SUPP: 650.0000 mg | Freq: Four times a day (QID) | RECTAL | Status: DC | PRN

## 2023-02-13 MED ORDER — GLYCOPYRROLATE 1 MG PO TABS: 1.0000 mg | ORAL_TABLET | ORAL | Status: DC | PRN

## 2023-02-13 MED ORDER — LORAZEPAM 2 MG/ML IJ SOLN: 2.0000 mg | INTRAMUSCULAR | Status: DC | PRN

## 2023-02-13 MED ORDER — HALOPERIDOL LACTATE 5 MG/ML IJ SOLN: 2.5000 mg | INTRAMUSCULAR | Status: DC | PRN

## 2023-02-13 MED ORDER — ACETAMINOPHEN 325 MG PO TABS: 650.0000 mg | ORAL_TABLET | Freq: Four times a day (QID) | ORAL | Status: DC | PRN

## 2023-02-13 MED ORDER — MORPHINE BOLUS VIA INFUSION: 5.0000 mg | INTRAVENOUS | Status: DC | PRN

## 2023-02-13 MED ORDER — PIPERACILLIN-TAZOBACTAM 3.375 G IVPB
3.3750 g | Freq: Three times a day (TID) | INTRAVENOUS | Status: DC
  Administered 2023-02-13 (×2): 3.375 g via INTRAVENOUS
  Filled 2023-02-13 (×2): qty 50

## 2023-02-13 MED ORDER — AMIODARONE HCL IN DEXTROSE 360-4.14 MG/200ML-% IV SOLN
30.0000 mg/h | INTRAVENOUS | Status: DC
  Administered 2023-02-13: 30 mg/h via INTRAVENOUS
  Filled 2023-02-13: qty 200

## 2023-02-13 MED ORDER — METOPROLOL TARTRATE 25 MG PO TABS
12.5000 mg | ORAL_TABLET | Freq: Four times a day (QID) | ORAL | Status: DC
  Administered 2023-02-13: 12.5 mg via ORAL
  Filled 2023-02-13: qty 1

## 2023-02-13 MED ORDER — GLYCOPYRROLATE 0.2 MG/ML IJ SOLN: 0.2000 mg | INTRAMUSCULAR | Status: DC | PRN

## 2023-02-13 MED ORDER — POLYVINYL ALCOHOL 1.4 % OP SOLN: 1.0000 [drp] | Freq: Four times a day (QID) | OPHTHALMIC | Status: DC | PRN

## 2023-02-13 MED ORDER — AMIODARONE LOAD VIA INFUSION
150.0000 mg | Freq: Once | INTRAVENOUS | Status: AC
  Administered 2023-02-13: 150 mg via INTRAVENOUS
  Filled 2023-02-13: qty 83.34

## 2023-02-13 MED ORDER — HEPARIN (PORCINE) 25000 UT/250ML-% IV SOLN
850.0000 [IU]/h | INTRAVENOUS | Status: DC
Start: 2023-02-13 — End: 2023-02-13

## 2023-02-13 MED ORDER — GLYCOPYRROLATE 0.2 MG/ML IJ SOLN
0.2000 mg | INTRAMUSCULAR | Status: DC | PRN
  Filled 2023-02-13: qty 1

## 2023-03-02 DIAGNOSIS — E46 Unspecified protein-calorie malnutrition: Secondary | ICD-10-CM | POA: Diagnosis not present

## 2023-03-02 DIAGNOSIS — I10 Essential (primary) hypertension: Secondary | ICD-10-CM | POA: Diagnosis not present

## 2023-03-04 NOTE — Death Summary Note (Signed)
 DEATH SUMMARY   Patient Details  Name: Lisa Martin MRN: [id number] DOB: 11-29-1931 ERE:Fpoozm, Oneil FALCON, MD Admission/Discharge Information   Admit Date:  02-26-2023  Date of Death: Date of Death: 03/05/23  Time of Death: Time of Death: 1900  Length of Stay: 6   Principle Cause of death: hypoxic respiratory failure due to combined effects of rhinovirus, community acquired pneumonia d/t rhinovirus, aspiration pneumonia/pneumonitis, dysphagia as result of dementia  Hospital Diagnoses: Principal Problem:   Pneumonia of right upper lobe due to infectious organism Active Problems:   Acute respiratory failure with hypoxia (HCC)   Atrial fibrillation with RVR (HCC)   AKI (acute kidney injury) (HCC)   Hypertension   Acquired hypothyroidism   Dementia (HCC)   Depression with anxiety   Personal history of malignant neoplasm of breast   Sepsis (HCC)   Rhinovirus infection   Acute respiratory distress   Abdominal distension   Tachypnea   Hospital course / significant events:   HPI: Lisa Martin is a 89 y.o. female with medical history significant for advanced dementia,  HTN, dCHF, hypothyroidism, depression with anxiety, GERD, breast cancer (s/p of radiation and chemotherapy), protein calorie malnutrition, prior falls with resulting hip fracture, rib fractures, with most recent fall 07/2022 with pelvic fracture treated nonoperatively, who presents from memory care unit with concerns for tachycardia and tachypnea with hypoxia to 89% requiring 6 L by EMS to maintain sats in the mid 90s.  She was also tachycardic to 170.  Patient unable to contribute to history due to dementia.   2023-02-26: admitted to hospitalist service for sepsis d/t pneumonia, (+)Rhinovirus, AfibRVR 01/07: converted to sinus rhythm. Echo w/ preserved EF Grade 1 diast df  01/08: titrated po diltiazem  ER 120 mg daily. Tachypneic, not tolerating BiPap 01/09-01/10: sleepy but easily awoken, confused/disoriented, tachypneic but not  in serious distress, not eating. Consider for palliative if not improving. Not responding to cues for po intake. See IPAL note.  01/11: more hypoxic overnight required BiPap but this caused agitation. Into Afib RVR this am - see note. D/w daughter, will continue tx as able, dilt IVx1 did not convert, placed on dilt gtt and cardiology consult. Placed on amio gtt. Pt able to come off BiPap and more calm mid-day. Closely monitoring 01/12: stable but still very sleepy, not engaging w/ SLP for feeding, will not meet nutrition needs. Amiodarone  converted to po Slightly worsening hypernatremia and WBC, monitoring  03-05-23: Afib RVR again overnight, restarted amio gtt. CXR this AM appears worsening R lobe infiltrate, high suspicion for aspiration, switching to Zosyn . Rate improved on amio, pt appears comfortable however less responsive throughout the day, not clearing secretions, more somnolent, bradycardic. Decision made this evening for comfort measures and patient passed at 19:00 witnessed and pronounced by Dr Marsa    Consultants:  Cardiology  Palliative Care   Procedures/Surgeries: none   ASSESSMENT & PLAN:  Hypoxic respiratory failure due to combined effects of rhinovirus, community acquired pneumonia d/t rhinovirus, aspiration pneumonia/pneumonitis, dysphagia as result of dementia, asphyxiation d/t inability to clear secretions. Decision made for comfort measures only and pt was given morphine  to treat air hunger. She expired later same evening      ASSESSMENT & PLAN PRIOR TO INITIATION OF COMFORT MEASURES:   CAP Rhinovirus Sepsis d/t pneumonia  Suspect aspiration pneumonitis / pneumonia  Worsening appearance on CXR question d/t IV fluids vs aspiration vs progressive disease  ceftriaxone /azithromycin  --> Zosyn   Prn ipratropium breathing treatments. Holding on albuterol  given a-fib  Acute hypoxic respiratory failure 2/2 pneumonia.  BiPAP/CPAP prn but she does not tolerate the mask  and becomes very agitated Hold IV fluids while maintaining BP since seemed to get worse w/ fluids few days ago steroids d/t worsening lung infiltrate but does not seem to be helping, will d/c  Nebulizers prn   Fever, leukocytosis + lung infiltrate = sepsis again 03/01/23  UA pending for completeness Broadened abx to Zosyn , suspect aspiration pna  - see above  Monitor sepsis parameters   A-fib RVR, converted to sinus then back to Afib RVR  continue heparin  Amiodarone  given low BP w/ diltiazem , back on amio gtt now  Cardiology following    Right foot pain No erythema. X-rays no fracture or other acute process monitor   Hypothyroid Tsh wnl cont home levothyroxine    Advanced alzheimer's dementia cont home memantine , seroquel , rivastigmine  Affecting swallow functioning GOC discussions ongoing given overall declining function   MDD home sertraline   Goals of care / Advanced care planning See IPAL note  Daughter and I discussed few days ago re: poor prognosis, pt is not likely to improve to baseline, and if not eating she will certainly not have much life expectancy left. Daughter states pt would not want feeding tubes.  Spoke to daughter again yesterday, she is open to hospice discussions, is reluctant to deescalate care otherwise at this time. Appreciate assistance of palliative care re: GOC clarification and completion MOST form    DVT prophylaxis: heparin  IV fluids: no continuous IV fluids  Nutrition: dysphagia, low PO intake  Central lines / invasive devices: none  Code Status: DNR ACP documentation reviewed:  none on file in [facility]  TOC needs: TBD Barriers to dispo / significant pending items: clinical illness, suspect may qualify for hospice if family decides to pursue that route      The results of significant diagnostics from this hospitalization (including imaging, microbiology, ancillary and laboratory) are listed below for reference.   Significant Diagnostic  Studies: DG Chest Port 1 View Result Date: March 01, 2023 CLINICAL DATA:  Fever. EXAM: PORTABLE CHEST 1 VIEW COMPARISON:  Chest radiograph dated 02/11/2023. FINDINGS: Interval progression of right upper lobe airspace opacity as well as progression of reticulonodular density in the right mid to lower lung field. Similar appearance of bilateral pleural effusions and bibasilar atelectasis or infiltrate. No pneumothorax. Stable cardiomediastinal silhouette. No acute osseous pathology. IMPRESSION: Interval progression of right lung infiltrates. Electronically Signed   By: Vanetta Chou M.D.   On: 2023/03/01 12:13   DG Chest Port 1 View Result Date: 02/11/2023 CLINICAL DATA:  36304 Hypoxemia 36304 EXAM: PORTABLE CHEST 1 VIEW COMPARISON:  Chest x-ray 02/06/2023, CT chest 02/07/2023 FINDINGS: Patient is rotated. The heart and mediastinal contours are unchanged. Atherosclerotic plaque. Interval worsening of right upper lobe airspace opacity. Chronic coarsened interstitial markings with likely superimposed mild pulmonary edema. Interval development of at least bilateral small, left greater than right, pleural effusions. No pneumothorax. No acute osseous abnormality. Thoracic kyphoplasty. Thoracolumbar surgical hardware corpectomy. Multiple old healed left rib fractures. IMPRESSION: 1. Interval worsening of right upper lobe airspace opacity. 2. Likely mild pulmonary edema. 3. Interval development of at least bilateral small, left greater than right, pleural effusions. Electronically Signed   By: Morgane  Naveau M.D.   On: 02/11/2023 01:47   ECHOCARDIOGRAM COMPLETE Result Date: 02/07/2023    ECHOCARDIOGRAM REPORT   Patient Name:   AALEYAH WITHEROW Date of Exam: 02/07/2023 Medical Rec #:  [id number]   Height:  62.0 in Accession #:    [id number]  Weight:       127.9 lb Date of Birth:  02-24-1931   BSA:          1.581 m Patient Age:    89 years    BP:           125/67 mmHg Patient Gender: F           HR:           77 bpm. Exam  Location:  [facility] Procedure: 2D Echo, Cardiac Doppler and Color Doppler Indications:     Atrial Fibrillation  History:         Patient has no prior history of Echocardiogram examinations.                  CHF, Arrythmias:Atrial Fibrillation, Signs/Symptoms:Altered                  Mental Status; Risk Factors:Hypertension.  Sonographer:     Naomie Reef Referring Phys:  [id number] DELAYNE LULLA SOLIAN Diagnosing Phys: Lonni Hanson MD  Sonographer Comments: Technically difficult study due to poor echo windows. Image acquisition challenging due to patient behavioral factors. IMPRESSIONS  1. Left ventricular ejection fraction, by estimation, is >55%. The left ventricle has normal function. Left ventricular endocardial border not optimally defined to evaluate regional wall motion. Left ventricular diastolic parameters are consistent with Grade I diastolic dysfunction (impaired relaxation).  2. Right ventricular systolic function is normal. The right ventricular size is normal.  3. Left atrial size was mildly dilated.  4. The mitral valve is normal in structure. Trivial mitral valve regurgitation. No evidence of mitral stenosis.  5. Tricuspid valve regurgitation is moderate.  6. The aortic valve was not well visualized. Aortic valve regurgitation is trivial. FINDINGS  Left Ventricle: Left ventricular ejection fraction, by estimation, is >55%. The left ventricle has normal function. Left ventricular endocardial border not optimally defined to evaluate regional wall motion. The left ventricular internal cavity size was  normal in size. There is borderline left ventricular hypertrophy. Left ventricular diastolic parameters are consistent with Grade I diastolic dysfunction (impaired relaxation). Right Ventricle: The right ventricular size is normal. No increase in right ventricular wall thickness. Right ventricular systolic function is normal. Left Atrium: Left atrial size was mildly dilated. Right Atrium: Right atrial size was  normal in size. Pericardium: The pericardium was not well visualized. Mitral Valve: The mitral valve is normal in structure. Trivial mitral valve regurgitation. No evidence of mitral valve stenosis. MV peak gradient, 5.1 mmHg. The mean mitral valve gradient is 2.0 mmHg. Tricuspid Valve: The tricuspid valve is not well visualized. Tricuspid valve regurgitation is moderate. Aortic Valve: The aortic valve was not well visualized. Aortic valve regurgitation is trivial. Aortic valve mean gradient measures 2.0 mmHg. Aortic valve peak gradient measures 5.5 mmHg. Aortic valve area, by VTI measures 2.27 cm. Pulmonic Valve: The pulmonic valve was not well visualized. Pulmonic valve regurgitation is trivial. No evidence of pulmonic stenosis. Aorta: The aortic root is normal in size and structure. Pulmonary Artery: The pulmonary artery is not well seen. Venous: The inferior vena cava was not well visualized. IAS/Shunts: The interatrial septum was not well visualized.  LEFT VENTRICLE PLAX 2D LVIDd:         4.20 cm   Diastology LVIDs:         2.50 cm   LV e' medial:    7.72 cm/s LV PW:  0.90 cm   LV E/e' medial:  11.8 LV IVS:        1.10 cm   LV e' lateral:   8.16 cm/s LVOT diam:     2.00 cm   LV E/e' lateral: 11.2 LV SV:         57 LV SV Index:   36 LVOT Area:     3.14 cm  LEFT ATRIUM           Index        RIGHT ATRIUM           Index LA diam:      2.90 cm 1.83 cm/m   RA Area:     12.84 cm LA Vol (A2C): 37.6 ml 23.79 ml/m  RA Volume:   29.17 ml  18.45 ml/m LA Vol (A4C): 65.7 ml 41.55 ml/m  AORTIC VALVE                    PULMONIC VALVE AV Area (Vmax):    2.79 cm     PV Vmax:       0.90 m/s AV Area (Vmean):   2.77 cm     PV Peak grad:  3.2 mmHg AV Area (VTI):     2.27 cm AV Vmax:           117.00 cm/s AV Vmean:          70.200 cm/s AV VTI:            0.251 m AV Peak Grad:      5.5 mmHg AV Mean Grad:      2.0 mmHg LVOT Vmax:         104.00 cm/s LVOT Vmean:        62.000 cm/s LVOT VTI:          0.181 m LVOT/AV VTI  ratio: 0.72  AORTA Ao Root diam: 2.90 cm MITRAL VALVE               TRICUSPID VALVE MV Area (PHT): 4.24 cm    TR Peak grad:   33.4 mmHg MV Area VTI:   1.60 cm    TR Vmax:        289.00 cm/s MV Peak grad:  5.1 mmHg MV Mean grad:  2.0 mmHg    SHUNTS MV Vmax:       1.13 m/s    Systemic VTI:  0.18 m MV Vmean:      66.2 cm/s   Systemic Diam: 2.00 cm MV Decel Time: 179 msec MV E velocity: 91.20 cm/s MV A velocity: 93.30 cm/s MV E/A ratio:  0.98 Lonni End MD Electronically signed by Lonni Hanson MD Signature Date/Time: 02/07/2023/2:33:21 PM    Final    DG Foot 2 Views Right Result Date: 02/07/2023 CLINICAL DATA:  Right foot and ankle pain EXAM: RIGHT FOOT - 2 VIEW; RIGHT ANKLE - 2 VIEW COMPARISON:  None Available. FINDINGS: There is no evidence of fracture or dislocation. Diffuse osteopenia. Diffuse soft tissue reticulation of the lower leg and more focal soft tissue swelling over the lateral malleolus. Vascular calcifications. IMPRESSION: 1. No acute fracture or dislocation. 2. Diffuse soft tissue reticulation of the lower leg and more focal soft tissue swelling over the lateral malleolus. Electronically Signed   By: Limin  Xu M.D.   On: 02/07/2023 09:49   DG Ankle 2 Views Right Result Date: 02/07/2023 CLINICAL DATA:  Right foot and ankle pain EXAM: RIGHT FOOT - 2 VIEW; RIGHT ANKLE -  2 VIEW COMPARISON:  None Available. FINDINGS: There is no evidence of fracture or dislocation. Diffuse osteopenia. Diffuse soft tissue reticulation of the lower leg and more focal soft tissue swelling over the lateral malleolus. Vascular calcifications. IMPRESSION: 1. No acute fracture or dislocation. 2. Diffuse soft tissue reticulation of the lower leg and more focal soft tissue swelling over the lateral malleolus. Electronically Signed   By: Limin  Xu M.D.   On: 02/07/2023 09:49   CT Angio Chest PE W/Cm &/Or Wo Cm Result Date: 02/07/2023 CLINICAL DATA:  Abdominal pain, bowel obstruction suspected, sepsis, tachypneic and  tachycardic. Hypoxemia requiring oxygen. Bilateral knee pain and abdominal distention and pain. PE suspected. EXAM: CT ANGIOGRAPHY CHEST CT ABDOMEN AND PELVIS WITH CONTRAST TECHNIQUE: Multidetector CT imaging of the chest was performed using the standard protocol during bolus administration of intravenous contrast. Multiplanar CT image reconstructions and MIPs were obtained to evaluate the vascular anatomy. Multidetector CT imaging of the abdomen and pelvis was performed using the standard protocol during bolus administration of intravenous contrast. RADIATION DOSE REDUCTION: This exam was performed according to the departmental dose-optimization program which includes automated exposure control, adjustment of the mA and/or kV according to patient size and/or use of iterative reconstruction technique. CONTRAST:  75mL OMNIPAQUE  IOHEXOL  350 MG/ML SOLN COMPARISON:  CT pelvis 07/14/2022 and CT chest 07/15/2016 FINDINGS: CTA CHEST FINDINGS Cardiovascular: Negative for acute pulmonary embolism. No pericardial effusion. Coronary artery and aortic atherosclerotic calcification. Mediastinum/Nodes: Esophagus is unremarkable. Nodules along the posterior trachea likely represent adherent debris. No lymphadenopathy. Lungs/Pleura: Respiratory motion obscures fine detail. Interlobular septal thickening and patchy ground-glass opacities. Bronchial wall thickening. Bibasilar atelectasis or infiltrates. No pleural effusion or pneumothorax. Musculoskeletal: Chronic left scapular and rib fractures. Subacute appearing left posterior 10th-11th rib fractures. Chronic appearing compression fractures of T7 and T8. T7 kyphoplasty. Postoperative changes at the thoracolumbar junction. Review of the MIP images confirms the above findings. CT ABDOMEN and PELVIS FINDINGS Hepatobiliary: Respiratory motion obscures detail in the abdomen. No acute hepatobiliary abnormality. Pancreas: Unremarkable. Spleen: Unremarkable. Adrenals/Urinary Tract:  Unremarkable adrenal glands. Bilateral small cystic lesions statistically likely to represent benign cysts. No follow-up recommended. Geographic hypoattenuation in the cortex of the left kidney may be due to artifact from streak and motion versus pyelonephritis. No hydronephrosis. Unremarkable bladder. Stomach/Bowel: Normal caliber large and small bowel. Extensive colonic diverticulosis without evidence of diverticulitis. Stomach is within normal limits. Vascular/Lymphatic: IVC filter. Aortic atherosclerosis. No enlarged abdominal or pelvic lymph nodes. Reproductive: No acute abnormality. Other: No free intraperitoneal fluid or air. Musculoskeletal: IM rod and screw fixation right femur. Left THA. Chronic right pubic rami fractures. No acute fracture. Review of the MIP images confirms the above findings. IMPRESSION: 1. Negative for acute pulmonary embolism. 2. Interlobular septal thickening and patchy ground-glass opacities with bibasilar atelectasis or infiltrates. Findings favor atypical infection. 3. Geographic hypoattenuation in the cortex of the left kidney may be due to streak artifact and motion versus pyelonephritis. Correlate with urinalysis. 4. Subacute appearing left posterior 10th-11th rib fractures. 5. Extensive colonic diverticulosis without evidence of diverticulitis. Aortic Atherosclerosis (ICD10-I70.0). Electronically Signed   By: Norman Gatlin M.D.   On: 02/07/2023 00:47   CT ABDOMEN PELVIS W CONTRAST Result Date: 02/07/2023 CLINICAL DATA:  Abdominal pain, bowel obstruction suspected, sepsis, tachypneic and tachycardic. Hypoxemia requiring oxygen. Bilateral knee pain and abdominal distention and pain. PE suspected. EXAM: CT ANGIOGRAPHY CHEST CT ABDOMEN AND PELVIS WITH CONTRAST TECHNIQUE: Multidetector CT imaging of the chest was performed using the standard protocol during bolus  administration of intravenous contrast. Multiplanar CT image reconstructions and MIPs were obtained to evaluate the  vascular anatomy. Multidetector CT imaging of the abdomen and pelvis was performed using the standard protocol during bolus administration of intravenous contrast. RADIATION DOSE REDUCTION: This exam was performed according to the departmental dose-optimization program which includes automated exposure control, adjustment of the mA and/or kV according to patient size and/or use of iterative reconstruction technique. CONTRAST:  75mL OMNIPAQUE  IOHEXOL  350 MG/ML SOLN COMPARISON:  CT pelvis 07/14/2022 and CT chest 07/15/2016 FINDINGS: CTA CHEST FINDINGS Cardiovascular: Negative for acute pulmonary embolism. No pericardial effusion. Coronary artery and aortic atherosclerotic calcification. Mediastinum/Nodes: Esophagus is unremarkable. Nodules along the posterior trachea likely represent adherent debris. No lymphadenopathy. Lungs/Pleura: Respiratory motion obscures fine detail. Interlobular septal thickening and patchy ground-glass opacities. Bronchial wall thickening. Bibasilar atelectasis or infiltrates. No pleural effusion or pneumothorax. Musculoskeletal: Chronic left scapular and rib fractures. Subacute appearing left posterior 10th-11th rib fractures. Chronic appearing compression fractures of T7 and T8. T7 kyphoplasty. Postoperative changes at the thoracolumbar junction. Review of the MIP images confirms the above findings. CT ABDOMEN and PELVIS FINDINGS Hepatobiliary: Respiratory motion obscures detail in the abdomen. No acute hepatobiliary abnormality. Pancreas: Unremarkable. Spleen: Unremarkable. Adrenals/Urinary Tract: Unremarkable adrenal glands. Bilateral small cystic lesions statistically likely to represent benign cysts. No follow-up recommended. Geographic hypoattenuation in the cortex of the left kidney may be due to artifact from streak and motion versus pyelonephritis. No hydronephrosis. Unremarkable bladder. Stomach/Bowel: Normal caliber large and small bowel. Extensive colonic diverticulosis without  evidence of diverticulitis. Stomach is within normal limits. Vascular/Lymphatic: IVC filter. Aortic atherosclerosis. No enlarged abdominal or pelvic lymph nodes. Reproductive: No acute abnormality. Other: No free intraperitoneal fluid or air. Musculoskeletal: IM rod and screw fixation right femur. Left THA. Chronic right pubic rami fractures. No acute fracture. Review of the MIP images confirms the above findings. IMPRESSION: 1. Negative for acute pulmonary embolism. 2. Interlobular septal thickening and patchy ground-glass opacities with bibasilar atelectasis or infiltrates. Findings favor atypical infection. 3. Geographic hypoattenuation in the cortex of the left kidney may be due to streak artifact and motion versus pyelonephritis. Correlate with urinalysis. 4. Subacute appearing left posterior 10th-11th rib fractures. 5. Extensive colonic diverticulosis without evidence of diverticulitis. Aortic Atherosclerosis (ICD10-I70.0). Electronically Signed   By: Norman Gatlin M.D.   On: 02/07/2023 00:47   DG Knee 2 Views Left Result Date: 02/07/2023 CLINICAL DATA:  Recent fall with knee pain, initial encounter EXAM: LEFT KNEE - 2 VIEW COMPARISON:  None Available. FINDINGS: Healed proximal fibular fracture is noted. No acute fracture or dislocation is noted. No soft tissue abnormality is seen. No joint effusion is noted. IMPRESSION: Healed proximal fibular fracture.  No acute abnormality noted. Electronically Signed   By: Oneil Devonshire M.D.   On: 02/07/2023 00:31   DG Knee 2 Views Right Result Date: 02/07/2023 CLINICAL DATA:  Fall, pain EXAM: RIGHT KNEE - 1-2 VIEW COMPARISON:  None Available. FINDINGS: Chondrocalcinosis and mild degenerative changes throughout the right knee. No acute bony abnormality. Specifically, no fracture, subluxation, or dislocation. No joint effusion. Vascular calcifications noted. IMPRESSION: No acute bony abnormality. Electronically Signed   By: Franky Crease M.D.   On: 02/07/2023 00:31    DG Chest Port 1 View Result Date: 02/06/2023 CLINICAL DATA:  Questionable sepsis - evaluate for abnormality. Low O2 sats. EXAM: PORTABLE CHEST 1 VIEW COMPARISON:  07/14/2022 FINDINGS: Heart is borderline in size. Mediastinal contours within normal limits. Scattered aortic atherosclerosis. Questionable patchy opacity in the right  upper lobe. No confluent opacity on the left. No effusions. No acute bony abnormality. Old healed left rib fractures. IMPRESSION: Questionable patchy right upper lobe opacity/infiltrate. Borderline heart size Electronically Signed   By: Franky Crease M.D.   On: 02/06/2023 23:28    Microbiology: Recent Results (from the past 240 hours)  Blood Culture (routine x 2)     Status: None   Collection Time: 02/06/23 11:14 PM   Specimen: BLOOD  Result Value Ref Range Status   Specimen Description BLOOD BLOOD RIGHT WRIST  Final   Special Requests   Final    BOTTLES DRAWN AEROBIC AND ANAEROBIC Blood Culture adequate volume   Culture   Final    NO GROWTH 5 DAYS Performed at [facility], [address], [place], [place] [address]    Report Status 02/11/2023 FINAL  Final  Blood Culture (routine x 2)     Status: None   Collection Time: 02/06/23 11:14 PM   Specimen: BLOOD  Result Value Ref Range Status   Specimen Description BLOOD BLOOD LEFT FOREARM  Final   Special Requests   Final    BOTTLES DRAWN AEROBIC AND ANAEROBIC Blood Culture adequate volume   Culture   Final    NO GROWTH 5 DAYS Performed at [facility], [address] [place], [place] [address]    Report Status 02/11/2023 FINAL  Final  Resp panel by RT-PCR (RSV, Flu A&B, Covid) Anterior Nasal Swab     Status: None   Collection Time: 02/06/23 11:35 PM   Specimen: Anterior Nasal Swab  Result Value Ref Range Status   SARS Coronavirus 2 by RT PCR NEGATIVE NEGATIVE Final    Comment: (NOTE) SARS-CoV-2 target nucleic acids are NOT DETECTED.  The SARS-CoV-2 RNA is generally detectable in upper  respiratory specimens during the acute phase of infection. The lowest concentration of SARS-CoV-2 viral copies this assay can detect is 138 copies/mL. A negative result does not preclude SARS-Cov-2 infection and should not be used as the sole basis for treatment or other patient management decisions. A negative result may occur with  improper specimen collection/handling, submission of specimen other than nasopharyngeal swab, presence of viral mutation(s) within the areas targeted by this assay, and inadequate number of viral copies(<138 copies/mL). A negative result must be combined with clinical observations, patient history, and epidemiological information. The expected result is Negative.  Fact Sheet for Patients:  bloggercourse.com  Fact Sheet for Healthcare Providers:  seriousbroker.it  This test is no t yet approved or cleared by the [place]  FDA and  has been authorized for detection and/or diagnosis of SARS-CoV-2 by FDA under an Emergency Use Authorization (EUA). This EUA will remain  in effect (meaning this test can be used) for the duration of the COVID-19 declaration under Section 564(b)(1) of the Act, 21 U.S.C.section 360bbb-3(b)(1), unless the authorization is terminated  or revoked sooner.       Influenza A by PCR NEGATIVE NEGATIVE Final   Influenza B by PCR NEGATIVE NEGATIVE Final    Comment: (NOTE) The Xpert Xpress SARS-CoV-2/FLU/RSV plus assay is intended as an aid in the diagnosis of influenza from Nasopharyngeal swab specimens and should not be used as a sole basis for treatment. Nasal washings and aspirates are unacceptable for Xpert Xpress SARS-CoV-2/FLU/RSV testing.  Fact Sheet for Patients: bloggercourse.com  Fact Sheet for Healthcare Providers: seriousbroker.it  This test is not yet approved or cleared by the [place]  FDA and has been  authorized for detection and/or diagnosis  of SARS-CoV-2 by FDA under an Emergency Use Authorization (EUA). This EUA will remain in effect (meaning this test can be used) for the duration of the COVID-19 declaration under Section 564(b)(1) of the Act, 21 U.S.C. section 360bbb-3(b)(1), unless the authorization is terminated or revoked.     Resp Syncytial Virus by PCR NEGATIVE NEGATIVE Final    Comment: (NOTE) Fact Sheet for Patients: bloggercourse.com  Fact Sheet for Healthcare Providers: seriousbroker.it  This test is not yet approved or cleared by the [place]  FDA and has been authorized for detection and/or diagnosis of SARS-CoV-2 by FDA under an Emergency Use Authorization (EUA). This EUA will remain in effect (meaning this test can be used) for the duration of the COVID-19 declaration under Section 564(b)(1) of the Act, 21 U.S.C. section 360bbb-3(b)(1), unless the authorization is terminated or revoked.  Performed at [facility], [address] [place], [place] [address]   Respiratory (~20 pathogens) panel by PCR     Status: Abnormal   Collection Time: 02/07/23  9:21 AM   Specimen: Nasopharyngeal Swab; Respiratory  Result Value Ref Range Status   Adenovirus NOT DETECTED NOT DETECTED Final   Coronavirus 229E NOT DETECTED NOT DETECTED Final    Comment: (NOTE) The Coronavirus on the Respiratory Panel, DOES NOT test for the novel  Coronavirus (2019 nCoV)    Coronavirus HKU1 NOT DETECTED NOT DETECTED Final   Coronavirus NL63 NOT DETECTED NOT DETECTED Final   Coronavirus OC43 NOT DETECTED NOT DETECTED Final   Metapneumovirus NOT DETECTED NOT DETECTED Final   Rhinovirus / Enterovirus DETECTED (A) NOT DETECTED Final   Influenza A NOT DETECTED NOT DETECTED Final   Influenza B NOT DETECTED NOT DETECTED Final   Parainfluenza Virus 1 NOT DETECTED NOT DETECTED Final   Parainfluenza Virus 2 NOT DETECTED NOT DETECTED  Final   Parainfluenza Virus 3 NOT DETECTED NOT DETECTED Final   Parainfluenza Virus 4 NOT DETECTED NOT DETECTED Final   Respiratory Syncytial Virus NOT DETECTED NOT DETECTED Final   Bordetella pertussis NOT DETECTED NOT DETECTED Final   Bordetella Parapertussis NOT DETECTED NOT DETECTED Final   Chlamydophila pneumoniae NOT DETECTED NOT DETECTED Final   Mycoplasma pneumoniae NOT DETECTED NOT DETECTED Final    Comment: Performed at [facility], [address], [place], [place] [address]  MRSA Next Gen by PCR, Nasal     Status: None   Collection Time: 02/07/23 10:14 AM   Specimen: Nasal Mucosa; Nasal Swab  Result Value Ref Range Status   MRSA by PCR Next Gen NOT DETECTED NOT DETECTED Final    Comment: (NOTE) The GeneXpert MRSA Assay (FDA approved for NASAL specimens only), is one component of a comprehensive MRSA colonization surveillance program. It is not intended to diagnose MRSA infection nor to guide or monitor treatment for MRSA infections. Test performance is not FDA approved in patients less than 67 years old. Performed at [facility], [address] [place], [place] [address]     Time spent: 30 minutes  Signed: Laneta Blunt, DO Electronically signed 02/14/23 10:20 AM

## 2023-03-04 NOTE — Progress Notes (Signed)
 I responded to a page from the nurse to provide spiritual support for the patient's family due to the patient's death. I arrived at the patient's room where her daughter was present. I provided spiritual support through pastoral presence, by sharing words of comfort, and leading in prayer.    2023-02-22 2001  Spiritual Encounters  Type of Visit Initial  Care provided to: [facility] partners present during encounter Nurse  Referral source Nurse (RN/NT/LPN)  Reason for visit Patient death  OnCall Visit Yes  Interventions  Spiritual Care Interventions Made Established relationship of care and support;Compassionate presence;Bereavement/grief support;Prayer;Supported grief process    Chaplain Dr Ozell Law

## 2023-03-04 NOTE — Progress Notes (Signed)
 Rounding Note    Patient Name: Lisa Martin Date of Encounter: Mar 14, 2023  [facility] Cardiologist: Lonni Hanson, MD   Subjective   Patient is somnolent on exam. Converted back to atrial fibrillation with RVR overnight, rates 130-150 bpm. Remains on HFNC 8L.   Inpatient Medications    Scheduled Meds:  amiodarone   200 mg Oral BID   [START ON 02/22/2023] amiodarone   200 mg Oral Daily   insulin  aspart  0-9 Units Subcutaneous Q4H   levothyroxine   125 mcg Oral Q0600   memantine   5 mg Oral BID   sertraline   25 mg Oral Daily   Continuous Infusions:  amiodarone      diltiazem  (CARDIZEM ) infusion Stopped (02/11/23 1446)   heparin  900 Units/hr (03/14/2023 0800)   piperacillin -tazobactam (ZOSYN )  IV     PRN Meds: acetaminophen  **OR** acetaminophen , haloperidol  lactate, ondansetron  **OR** ondansetron  (ZOFRAN ) IV   Vital Signs    Vitals:   14-Mar-2023 0600 03-14-2023 0643 03/14/23 0700 Mar 14, 2023 0816  BP: 122/83  137/73 (!) 137/97  Pulse: (!) 54 (!) 33 89 78  Resp: (!) 27 (!) 25 15 (!) 38  Temp:    97.7 F (36.5 C)  TempSrc:    Oral  SpO2: 97% 98% 97% 96%  Weight:      Height:        Intake/Output Summary (Last 24 hours) at 2023-03-14 0841 Last data filed at 03-14-2023 0800 Gross per 24 hour  Intake 815.93 ml  Output 550 ml  Net 265.93 ml      02/12/2023    5:45 AM 02/12/2023    4:45 AM 02/06/2023   11:11 PM  Last 3 Weights  Weight (lbs) 119 lb 14.9 oz 118 lb 6.2 oz 127 lb 13.9 oz  Weight (kg) 54.4 kg 53.7 kg 58 kg      Telemetry    Atrial fibrillation with RVR, occasional PVCs, rate 130-150 bpm - Personally Reviewed  Physical Exam   GEN: No acute distress. Somnolent. Mitten on left hand. Neck: No JVD Cardiac: IRIR, tachycardic, no murmurs, rubs, or gallops.  Respiratory: Decreased breath sounds bilaterally GI: Soft, nontender, non-distended  MS: No edema; No deformity. Neuro:  Nonfocal   Labs    High Sensitivity Troponin:   Recent Labs  Lab  02/06/23 2314 02/07/23 0154  TROPONINIHS 21* 37*     Chemistry Recent Labs  Lab 02/06/23 2314 02/08/23 0510 02/11/23 0422 02/12/23 0912 2023-03-14 0414  NA 139   < > 147* 149* 147*  K 4.0   < > 3.1* 3.4* 4.3  CL 105   < > 104 103 101  CO2 21*   < > 31 34* 34*  GLUCOSE 177*   < > 227* 138* 211*  BUN 31*   < > 31* 41* 39*  CREATININE 0.95   < > 0.83 0.90 0.80  CALCIUM 8.6*   < > 8.7* 8.9 8.5*  MG  --   --   --   --  2.6*  PROT 7.3  --   --   --   --   ALBUMIN 3.7  --   --   --   --   AST 21  --   --   --   --   ALT 20  --   --   --   --   ALKPHOS 71  --   --   --   --   BILITOT 1.1  --   --   --   --  GFRNONAA 57*   < > >60 >60 >60  ANIONGAP 13   < > 12 12 12    < > = values in this interval not displayed.    Lipids No results for input(s): CHOL, TRIG, HDL, LABVLDL, LDLCALC, CHOLHDL in the last 168 hours.  Hematology Recent Labs  Lab 02/11/23 0422 02/12/23 0912 2023/03/08 0414  WBC 18.8* 21.3* 22.2*  RBC 3.70* 3.55* 3.79*  HGB 11.6* 11.1* 11.8*  HCT 34.1* 32.3* 35.3*  MCV 92.2 91.0 93.1  MCH 31.4 31.3 31.1  MCHC 34.0 34.4 33.4  RDW 15.3 15.0 15.2  PLT 325 309 299   Thyroid   Recent Labs  Lab 02/06/23 2314  TSH 3.679  FREET4 1.34*    BNPNo results for input(s): BNP, PROBNP in the last 168 hours.  DDimer No results for input(s): DDIMER in the last 168 hours.   Radiology    No results found.  Cardiac Studies   2D echo 02/07/2023: 1. Left ventricular ejection fraction, by estimation, is >55%. The left  ventricle has normal function. Left ventricular endocardial border not  optimally defined to evaluate regional wall motion. Left ventricular  diastolic parameters are consistent with  Grade I diastolic dysfunction (impaired relaxation).   2. Right ventricular systolic function is normal. The right ventricular  size is normal.   3. Left atrial size was mildly dilated.   4. The mitral valve is normal in structure. Trivial mitral valve   regurgitation. No evidence of mitral stenosis.   5. Tricuspid valve regurgitation is moderate.   6. The aortic valve was not well visualized. Aortic valve regurgitation  is trivial.   Patient Profile     89 y.o. female with history of advanced dementia, breast cancer, recurrent falls resulting with hip and rib fractures with most recent fall in 07/2022 resulting in pelvic fracture treated nonoperatively, HTN, hypothyroidism, depression, and anxiety who is admitted with acute hypoxic respiratory failure and sepsis secondary to community-acquired pneumonia and is being seen today for the further evaluation of A-fib with RVR.   Assessment & Plan    Newly diagnosed atrial fibrillation with RVR - Presented in atrial fibrillation with RVR has converted in and out of NSR several times during admission, suspect in the setting of acute pulmonary illness.  - Was previously hypotensive with diltiazem . Amiodarone  bolus and infusion started 1/11 with conversion to sinus rhythm. Transitioned to oral amiodarone  200 BID yesterday 1/12. Overnight 08-Mar-2023 she was back in atrial fibrillation with RVR. She was restarted on amiodarone  bolus and infusion 03/08/2023. - Echo shows preserved LVEF > 55% - Remains in atrial fibrillation with rates 130-150 - BP stable - Continue IV amiodarone  for rate and rhythm control - Start metoprolol  12.5 mg q6 for further rate control - Continue IV heparin  during admission. Patient not a good candidate for oral anticoagulation as OP due to frequent falls.  Acute hypoxic respiratory failure with sepsis secondary to CAP complicated by advanced dementia Rhinovirus - Previously diuresed with IV Lasix  40 mg x 3 - Remains on high flow nasal cannula 8 L - Management per IM  For questions or updates, please contact [facility] Please consult [email or web address] for contact info under        Signed, Lesley LITTIE Maffucci, PA-C  2023/03/08, 8:41 AM

## 2023-03-04 NOTE — IPAL (Signed)
  Interdisciplinary Goals of Care Family Meeting   Date carried out: 2023/03/06  Location of the meeting: Bedside  Member's involved: Physician, Bedside Registered Nurse, and Family Member or next of kin  Durable Power of Attorney or acting medical decision maker: Patient's daughter, Rock    Discussion: We discussed goals of care for Lisa Martin .   At this point, she has declined significantly throughout the day see recent progress note. Daughter is at bedside and RN and I described patient's symptoms, interventions attempted, and now it is clear that aggressive medical interventions are not improving clinical status, and patient is showing signs of discomfort and agonal breathing. My recommendation at this point is to recognize that aggressive medical interventions are not resulting in meeting goals of improvement, so focus now should be on the goal of comfort in the best interest of the patient. Rock agrees to comfort measures only - d/c unnecessary monitoring equipment, VS, labs, meds. Comfort care orders placed.   Code status:   Code Status: Do not attempt resuscitation (DNR) - Comfort care   Disposition: In-patient comfort care  Time spent for the meeting: 30 min     Laneta Blunt, DO  06-Mar-2023, 6:24 PM

## 2023-03-04 NOTE — Plan of Care (Signed)
  Problem: Fluid Volume: Goal: Hemodynamic stability will improve Outcome: Not Progressing   Problem: Clinical Measurements: Goal: Diagnostic test results will improve Outcome: Not Progressing Goal: Signs and symptoms of infection will decrease Outcome: Not Progressing   Problem: Respiratory: Goal: Ability to maintain adequate ventilation will improve Outcome: Progressing Note: Down from 8l [place] to 4l    Problem: Education: Goal: Knowledge of General Education information will improve Description: Including pain rating scale, medication(s)/side effects and non-pharmacologic comfort measures Outcome: Not Progressing   Problem: Health Behavior/Discharge Planning: Goal: Ability to manage health-related needs will improve Outcome: Not Progressing   Problem: Clinical Measurements: Goal: Ability to maintain clinical measurements within normal limits will improve Outcome: Not Progressing Goal: Will remain free from infection Outcome: Not Progressing Goal: Diagnostic test results will improve Outcome: Not Progressing Goal: Respiratory complications will improve Outcome: Not Progressing Goal: Cardiovascular complication will be avoided Outcome: Not Progressing   Problem: Activity: Goal: Risk for activity intolerance will decrease Outcome: Not Progressing   Problem: Nutrition: Goal: Adequate nutrition will be maintained Outcome: Not Progressing   Problem: Coping: Goal: Level of anxiety will decrease Outcome: Not Progressing   Problem: Elimination: Goal: Will not experience complications related to bowel motility Outcome: Not Progressing Goal: Will not experience complications related to urinary retention Outcome: Not Progressing   Problem: Pain Management: Goal: General experience of comfort will improve Outcome: Not Progressing   Problem: Safety: Goal: Ability to remain free from injury will improve Outcome: Not Progressing   Problem: Skin Integrity: Goal: Risk  for impaired skin integrity will decrease Outcome: Not Progressing   Problem: Education: Goal: Ability to describe self-care measures that may prevent or decrease complications (Diabetes Survival Skills Education) will improve Outcome: Not Progressing Goal: Individualized Educational Video(s) Outcome: Not Progressing   Problem: Coping: Goal: Ability to adjust to condition or change in health will improve Outcome: Not Progressing   Problem: Fluid Volume: Goal: Ability to maintain a balanced intake and output will improve Outcome: Not Progressing   Problem: Health Behavior/Discharge Planning: Goal: Ability to identify and utilize available resources and services will improve Outcome: Not Progressing Goal: Ability to manage health-related needs will improve Outcome: Not Progressing   Problem: Metabolic: Goal: Ability to maintain appropriate glucose levels will improve Outcome: Not Progressing   Problem: Nutritional: Goal: Maintenance of adequate nutrition will improve Outcome: Not Progressing Goal: Progress toward achieving an optimal weight will improve Outcome: Not Progressing   Problem: Skin Integrity: Goal: Risk for impaired skin integrity will decrease Outcome: Not Progressing   Problem: Tissue Perfusion: Goal: Adequacy of tissue perfusion will improve Outcome: Not Progressing

## 2023-03-04 NOTE — Consult Note (Signed)
 Pharmacy Antibiotic Note  Lisa Martin is a 89 y.o. female admitted on 02/06/2023 with concerns for tachycardia and tachypnea with hypoxia to 89% requiring 6 L by EMS to maintain sats in the mid 90s. PMH significant for advanced dementia, HTN, dCHF, hypothyroidism, depression with anxiety, GERD, breast cancer (s/p of radiation and chemotherapy), protein calorie malnutrition, prior falls with resulting hip fracture, rib fractures, with most recent fall 07/2022 with pelvic fracture treated nonoperatively. Pharmacy has been consulted for Zosyn  dosing for concerns for aspiration pneumonia.   Today, 26-Feb-2023 Day 1 of Zosyn   WBC 22.2  Afebrile  Renal function stable; Scr 0.80 today with estimated CrCl 37.7 mL/min  Currently on 8 L HFNC Recently completed 5 day course of ceftriaxone /azithromycin  for CAP on 1/11  1/11 CXR Interval worsening of right upper lobe airspace opacity   Plan: Start Zosyn  3.375 gm IV Q8H based on current renal function  F/u repeat CXR  Pharmacy will continue to monitor and dose adjust appropriately   Height: 5' 2 (157.5 cm) Weight: 54.4 kg (119 lb 14.9 oz) IBW/kg (Calculated) : 50.1  Temp (24hrs), Avg:99.1 F (37.3 C), Min:98.2 F (36.8 C), Max:99.8 F (37.7 C)  Recent Labs  Lab 02/06/23 2314 02/07/23 0154 02/08/23 0510 02/09/23 0634 02/10/23 0334 02/11/23 0422 02/12/23 0912 02-26-23 0414  WBC 22.9*  --    < > 23.1* 21.2* 18.8* 21.3* 22.2*  CREATININE 0.95  --    < > 0.82 0.90 0.83 0.90 0.80  LATICACIDVEN 2.2* 2.1*  --   --   --   --   --   --    < > = values in this interval not displayed.    Estimated Creatinine Clearance: 37.7 mL/min (by C-G formula based on SCr of 0.8 mg/dL).    Allergies  Allergen Reactions   Atorvastatin Cough   Antimicrobials this admission: 01/07 cefepime  & vancomycin  x 1 01/07 azithromycin  >> 01/11 01/07 ceftriaxone  >> 01/11 02/26/23 Zosyn  >>   Dose adjustments this admission:  Microbiology results: 01/06 BCx: NGTD final   01/07 MRSA PCR: negative  01/07 Respiratory panel: Rhinovirus/Enterovirus positive   Thank you for allowing pharmacy to be a part of this patient's care.  Evonnie Nieves, PharmD Pharmacy Resident  2023/02/26 7:56 AM

## 2023-03-04 NOTE — Progress Notes (Signed)
 Pt picked up by transporter from [facility] in [place].  Pt was full release from the morgue.  All pt belongings were taken home with her daughter [name].  Funeral home paperwork and post-mortem checklist complete.

## 2023-03-04 NOTE — Progress Notes (Addendum)
 0800 patient alert to self and place pur wick things changed for UA 0900 meds taken in apple sauce 0925 patient coverts to NSR  1230 patient 02 alarming 85 patient took off 02 increased to 6l [place] to get above 90 bath completed. Patient stays awake enough to get a few spoons of NTL in but not enough to eat at this time 1730 patient 02 levels dropped to 65 needed 15L and oral suction to clear secretions now back on 4L [place] only responds to stimuli and no longer following commands 1734 increase 02 to 6L [place] held lopressor  HR 54 1815 daughter at bedside agreeable to go comfort with patient declining  1840 morphine  gtt started patient taken off tele

## 2023-03-04 NOTE — Progress Notes (Addendum)
 Called to bedside by RN - Concern for deterioration  Pt requiring suctioning to clear secretions, does not seem to be swallowing  SpO2 dropped, requiring more O2 to maintain saturations More somnolent, rouses some to voice but isn't talking  Bradycardic - amio gtt confirmed off, cardiology aware  Low resp rate and irregular breathing Pt appears comfortable, no agitation   Called and spoke w/ daughter Rock 5:45 PM  I advised that we are giving more O2, suctioning, and have adjusted medications, but prognosis is poor and I asked if she could come to bedside. She states she was on her way here when I called and I instructed her to let RN know when she's here and I will come talk to her / reassess patient again.   Time spent 40 min

## 2023-03-04 NOTE — Progress Notes (Signed)
 Patient's heart rate is sustaining in the 150's. Notified NP Jawo. No new orders at this time, continue to monitor.

## 2023-03-04 NOTE — Consult Note (Signed)
 Consultation Note Date: 2023/02/15 at 1030  Patient Name: Lisa Martin  DOB: Apr 13, 1931  MRN: [id number]  Age / Sex: 89 y.o., female  PCP: Lisa Oneil FALCON, MD Referring Physician: Marsa Edelman, DO  HPI/Patient Profile: 89 y.o. female  with past medical history of advanced dementia, HTN, dCHF, hypothyroidism, depression with anxiety, GERD, breast cancer (s/p of radiation and chemotherapy), protein calorie malnutrition, prior falls with resulting hip fracture, rib fractures, with most recent fall 07/2022 with pelvic fracture treated nonoperatively admitted from [facility] memory care unit on 02/06/2023 with tachycardia and tachypnea with hypoxia requiring 6L by EMS.   Course of hospitalization is as follows:  01/06: admitted to hospitalist service for sepsis d/t pneumonia, (+)Rhinovirus, AfibRVR 01/07: converted to sinus rhythm. Echo w/ preserved EF Grade 1 diast df  01/08: titrated po diltiazem  ER 120 mg daily. Tachypneic, not tolerating BiPap 01/09-01/10: sleepy but easily awoken, confused/disoriented, tachypneic but not in serious distress, not eating. Consider for palliative if not improving. Not responding to cues for po intake. See IPAL note.  01/11: more hypoxic overnight required BiPap but this caused agitation. Into Afib RVR this am - see note. D/w daughter, will continue tx as able, dilt IVx1 did not convert, placed on dilt gtt and cardiology consult. Pt able to come off BiPap and more calm mid-day. Closely monitoring 01/12: stable but still very sleepy, not engaging w/ SLP for feeding, will not meet nutrition needs. Amiodarone  converted to po. Off drips today in sinus rhythm. Slightly worsening hypernatremia and WBC, monitoring  2023/02/15: Afib RVR again overnight, restarted amio gtt. CXR this AM appears worsening R lobe infiltrate, high suspicion for aspiration, switching to Zosyn . Rate improved on amio,  pt appears comfortable. Palliative consult pending and daughter is open to speaking w/ hospice - will arrange   Clinical Assessment and Goals of Care: Extensive chart review completed prior to meeting patient including labs, vital signs, imaging, progress notes, orders, and available advanced directive documents from current and previous encounters. I then met with patient at bedside.  She is interactive but unable to participate in goals of care or medical decision making independently at this time.  She says yes/no to questions but not appropriately.  When asked if she has family, she says she has a daughter Lisa Martin and a son whose name she cannot recall.  She endorses she is not in any pain or discomfort at this time. No family or friends present during my visit.  After speaking with and assessing the patient, I spoke with her daughter Lisa Martin over the phone to discuss diagnosis prognosis, GOC, EOL wishes, disposition and options.  I introduced Palliative Medicine as specialized medical care for people living with serious illness. It focuses on providing relief from the symptoms and stress of a serious illness. The goal is to improve quality of life for both the patient and the family.  We discussed a brief life review of the patient.  Patient lost her husband about 10 years ago.  Lisa Martin has been her primary  caregiver.  Patient lived with Lisa Martin for about a year prior to her dementia becoming so advanced that she was unable to be managed at home.  Patient resides in the memory care unit at [facility].  We discussed patient's current illness-pneumonia, rhinovirus, A-fib with RVR-and what it means in the larger context of patient's on-going co-morbidities-advanced dementia, diastolic CHF.  Natural disease trajectory discussed.  Education provided that dementia is a chronic, progressive, and irreversible disease that is often exacerbated by acute illnesses and hospitalizations.  I attempted to elicit  values and goals of care important to the patient.  Lisa Martin shares she knows that her mother has been deteriorating for some time.  We discussed the plan was to see if patient improved over the weekend.  She has not improved.  Therefore, we are discussing next steps and options.  Discussed patient's overall poor functional, nutritional, and cognitive status.  Reviewed these are significant indicators of patient's overall prognosis.  Given patient's poor p.o. intake and functional status, high supplemental oxygen requirements, and chest x-ray revealing worsening right lobe infiltrate (likely aspiration), I discussed shifting plan of care from aggressive medical treatment to comfort measures.  Quality of life versus quantity of life discussed.  We discussed comfort measures. [name] would no longer receive aggressive medical interventions such as continuous vital signs, lab work, radiology testing, or medications not focused on comfort. All care would focus on how she is looking and feeling. This would include management of any symptoms that may cause discomfort, pain, shortness of breath, cough, nausea, agitation, anxiety, and/or secretions etc. Symptoms would be managed with medications and other non-pharmacological interventions such as spiritual support if requested, repositioning, music therapy, or therapeutic listening.   I specifically addressed Lisa Martin's concerns that patient will be given morphine  and put to sleep like a pet.  She shares she had discussions with respiratory therapy that made it seem like we would give her morphine  so that she would no longer breathe.  Discussed that morphine  is given for air hunger, increased work of breathing, and that no medication is ever given to hasten patient's death.  Family verbalized understanding and appreciation.  Opportunity offered for Lisa Martin to ask questions.  Shares patient has been exhibiting signs that she is likely transitioning to end-of-life.  For  example, Lisa Martin shares patient has been reaching up towards the sky.  She is also discussing people she has not talked about for several years (her son Lisa Martin).  Discussed patient is visiting and likely showing signs of transitioning to end-of-life, but is not at active end-of-life stage at this time.  She shares she is not ready to remove the amiodarone  and heparin  drips.  It is her understanding that once these are removed, patient will likely pass quickly.  I highlighted patient would likely not pass once these are removed but it would certainly no longer control her heart rate or rhythm.  I reiterated that this will be allowing the natural disease process to occur and liberate patient from medical intervention.  Lisa Martin shares she is not ready for this to occur.  No plan of care changes at this point.  Lisa Martin in agreement to shift to comfort at some point in the near future.  However, she is not ready to shift to full comfort measures at this time.  She plans to visit later this afternoon.  We agreed that I will touch base with her again tomorrow.  Hospice philosophy and services discussed.  Reviewed that patient may stay  in hospital or could potentially be transferred back to memory care unit with hospice services to follow.  I advised her that [facility] is following closely for discharge planning when appropriate.  PMT will continue to follow and support patient and family throughout hospitalization.  Attending Dr. Marsa made aware of above discussion.  Primary Decision Maker NEXT OF KIN  Physical Exam Vitals reviewed.  Constitutional:      General: She is not in acute distress.    Appearance: She is not ill-appearing.  HENT:     Head: Normocephalic.  Eyes:     Pupils: Pupils are equal, round, and reactive to light.  Cardiovascular:     Rate and Rhythm: Normal rate and regular rhythm.  Pulmonary:     Breath sounds: Examination of the right-lower field reveals decreased breath sounds.  Examination of the left-lower field reveals decreased breath sounds. Decreased breath sounds present.     Comments: [place] in place Musculoskeletal:     Comments: Generalized weakness  Skin:    General: Skin is warm and dry.  Psychiatric:        Mood and Affect: Mood normal. Mood is not anxious.        Behavior: Behavior normal. Behavior is not agitated.     Palliative Assessment/Data: 30%     Thank you for this consult. Palliative medicine will continue to follow and assist holistically.   Time Total: 75 minutes  Time spent includes: Detailed review of medical records (labs, imaging, vital signs), medically appropriate exam (mental status, respiratory, cardiac, skin), discussed with treatment team, counseling and educating patient, family and staff, documenting clinical information, medication management and coordination of care.  Signed by: Lamarr Gunner, DNP, FNP-BC Palliative Medicine   Please contact Palliative Medicine Team providers via [facility] for questions and concerns.

## 2023-03-04 NOTE — Progress Notes (Signed)
 ANTICOAGULATION CONSULT NOTE  Pharmacy Consult for heparin  infusion Indication: atrial fibrillation  Allergies  Allergen Reactions   Atorvastatin Cough   Patient Measurements: Height: 5' 2 (157.5 cm) Weight: 54.4 kg (119 lb 14.9 oz) IBW/kg (Calculated) : 50.1 Heparin  Dosing Weight: 58 kg  Vital Signs: Temp: 97.4 F (36.3 C) 02/22/2023 1202) Temp Source: Oral 02/22/2023 1202) BP: 135/88 02/22/2023 1400) Pulse Rate: 69 02/22/23 1400)  Labs: Recent Labs    02/11/23 0422 02/11/23 1456 02/12/23 0912 22-Feb-2023 0414 02/22/23 1423  HGB 11.6*  --  11.1* 11.8*  --   HCT 34.1*  --  32.3* 35.3*  --   PLT 325  --  309 299  --   HEPARINUNFRC 0.72*   < > 0.67 0.89* 0.76*  CREATININE 0.83  --  0.90 0.80  --    < > = values in this interval not displayed.   Estimated Creatinine Clearance: 37.7 mL/min (by C-G formula based on SCr of 0.8 mg/dL).  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 In the ED they were noted to have atrial fibrillation with rapid ventricular response, currently in sinus rhythm. Pharmacy has been consulted for initiation and management of heparin  infusion for atrial fibrillation. Hgb stable (seems to be baseline)  Goal of  Therapy:  Heparin  level 0.3-0.7 units/ml Monitor platelets by anticoagulation protocol: Yes  Heparin  Levels Date/Time HL Clinical Assessment  1/7@0740   0.33 Therapeutic x 1  1/7@1501   0.75 SUPRAtherapeutic   1/8 @ 0228 0.30 Therapeutic x 1  1/8 @ 1104 0.21 Subtherapeutic   1/8 @ 2138 0.17 Subtherapeutic  1/9 @ 0634 0.53 Therapeutic x 1   1/9 @ 1418 0.48 Therapeutic x 2   1/10 0334 0.45 Therapeutic x 3  1/11 0422 0.72 Supratherapeutic  1/11 1456 0.38 Therapeutic x 1  1/11 2245 0.59 Therapeutic x 2  1/12 0912 0.67 Called lab to get result  02-22-2023 0414 0.89 Supratherapeutic  02/22/2023 1423 0.76 Supra-therapeutic   Plan:   Decrease heparin  infusion to 850 units Recheck heparin  level in 8 hrs after rate change CBC daily with AM labs.   Thank you for involving pharmacy in this patient's care.   Cardell Rachel Rodriguez-Guzman PharmD, BCPS 02/22/23 3:25 PM

## 2023-03-04 NOTE — Progress Notes (Signed)
       CROSS COVER NOTE  NAME: Lisa Martin MRN: [id number] DOB : [birth year]    Date of Service   02-24-23   HPI/Events of Note   Nurse paged because patient was noted to be tachycardic on the monitor with rates in the 150s.  After ECG was completed patient was noted to be in A-fib RVR.  Chart review showed that patient was initially being treated for A-fib RVR but converted to sinus and was started on oral amiodarone .  Patient with low-grade fever and the pain is within normal limits.  Interventions   Plan: Spoke with cardiologist Dr. Dolores who recommended starting patient on IV amiodarone  bolus followed by infusion. Patient was given rectal Tylenol  in case tachycardia was been driven by fever. Patient's rate improved but continues to fluctuate and 140s and normalizes again. Consider reengaging cardiology this a.m.     Lisa Sheeran Lamin [place] Mckeough, MSN, APRN, AGACNP-BC [facility] [place] Pager: [phone]. Check Amion for Availability

## 2023-03-04 NOTE — Progress Notes (Signed)
 PROGRESS NOTE    Lisa Martin   FMW:[id number] DOB: 08/07/1931  DOA: 02/06/2023 Date of Service: 16-Feb-2023 which is hospital day 6  PCP: Cleotilde Oneil FALCON, MD    Hospital course / significant events:   HPI: Lisa Martin is a 89 y.o. female with medical history significant for advanced dementia,  HTN, dCHF, hypothyroidism, depression with anxiety, GERD, breast cancer (s/p of radiation and chemotherapy), protein calorie malnutrition, prior falls with resulting hip fracture, rib fractures, with most recent fall 07/2022 with pelvic fracture treated nonoperatively, who presents from memory care unit with concerns for tachycardia and tachypnea with hypoxia to 89% requiring 6 L by EMS to maintain sats in the mid 90s.  She was also tachycardic to 170.  Patient unable to contribute to history due to dementia.   01/06: admitted to hospitalist service for sepsis d/t pneumonia, (+)Rhinovirus, AfibRVR 01/07: converted to sinus rhythm. Echo w/ preserved EF Grade 1 diast df  01/08: titrated po diltiazem  ER 120 mg daily. Tachypneic, not tolerating BiPap 01/09-01/10: sleepy but easily awoken, confused/disoriented, tachypneic but not in serious distress, not eating. Consider for palliative if not improving. Not responding to cues for po intake. See IPAL note.  01/11: more hypoxic overnight required BiPap but this caused agitation. Into Afib RVR this am - see note. D/w daughter, will continue tx as able, dilt IVx1 did not convert, placed on dilt gtt and cardiology consult. Pt able to come off BiPap and more calm mid-day. Closely monitoring 01/12: stable but still very sleepy, not engaging w/ SLP for feeding, will not meet nutrition needs. Amiodarone  converted to po. Off drips today in sinus rhythm. Slightly worsening hypernatremia and WBC, monitoring  Feb 16, 2023: Afib RVR again overnight, restarted amio gtt. CXR this AM appears worsening R lobe infiltrate, high suspicion for aspiration, switching to Zosyn . Rate improved on  amio, pt appears comfortable. Palliative consult pending and daughter is open to speaking w/ hospice - will arrange    Consultants:  Cardiology  Palliative Care   Procedures/Surgeries: none      ASSESSMENT & PLAN:   CAP Rhinovirus Sepsis d/t pneumonia  Suspect aspiration pneumonitis / pneumonia  Worsening appearance on CXR question d/t IV fluids vs aspiration vs progressive disease  ceftriaxone /azithromycin  --> Zosyn  today  Prn ipratropium breathing treatments. Holding on albuterol  given a-fib   Acute hypoxic respiratory failure - stabilized  2/2 pneumonia.  BiPAP/CPAP prn but she does not tolerate the mask and becomes very agitated Hold IV fluids while maintaining BP since seemed to get worse w/ fluids few days ago steroids d/t worsening lung infiltrate but does not seem to be helping, will d/c  Nebulizers prn   Fever, leukocytosis + lung infiltrate = sepsis again 02-16-23  UA pending for completeness Broadened abx to Zosyn , suspect aspiration pna  - see above  Monitor sepsis parameters   A-fib RVR, converted to sinus then back to Afib RVR  continue heparin  Amiodarone  given low BP w/ diltiazem , back on amio gtt now  Cardiology following    Right foot pain No erythema. X-rays no fracture or other acute process monitor   Hypothyroid Tsh wnl cont home levothyroxine    Advanced alzheimer's dementia cont home memantine , seroquel , rivastigmine  Affecting swallow functioning GOC discussions ongoing given overall declining function   MDD home sertraline   Goals of care / Advanced care planning See IPAL note  Daughter and I discussed few days ago re: poor prognosis, pt is not likely to improve to baseline, and if not eating  she will certainly not have much life expectancy left. Daughter states pt would not want feeding tubes.  Spoke to daughter again yesterday, she is open to hospice discussions, is reluctant to deescalate care otherwise at this time. Appreciate  assistance of palliative care re: GOC clarification and completion MOST form    DVT prophylaxis: heparin  IV fluids: no continuous IV fluids  Nutrition: dysphagia, low PO intake  Central lines / invasive devices: none  Code Status: DNR ACP documentation reviewed:  none on file in [facility]  TOC needs: TBD Barriers to dispo / significant pending items: clinical illness, suspect may qualify for hospice if family decides to pursue that route              Subjective / Brief ROS:  Patient this morning was sleepy but a bit more talkative - when asked how she is she says fine, when asked if trouble breathing she says breathing fine, when asked about chest pain she says no chest pain when asked about anything else she says nope.    Family Communication: saw pt early AM no family at bedside will attempt update later today either by phopne or at bedside if they visit     Objective Findings:  Vitals:   03-08-2023 0600 03/08/23 0643 03/08/2023 0700 08-Mar-2023 0816  BP: 122/83  137/73 (!) 137/97  Pulse: (!) 54 (!) 33 89 78  Resp: (!) 27 (!) 25 15 (!) 38  Temp:    97.7 F (36.5 C)  TempSrc:    Oral  SpO2: 97% 98% 97% 96%  Weight:      Height:        Intake/Output Summary (Last 24 hours) at 03-08-23 9090 Last data filed at Mar 08, 2023 0800 Gross per 24 hour  Intake 815.93 ml  Output 550 ml  Net 265.93 ml   Filed Weights   02/06/23 2311 02/12/23 0445 02/12/23 0545  Weight: 58 kg 53.7 kg 54.4 kg    Examination:  Physical Exam Constitutional:      General: She is not in acute distress (better on reexamination mid-day, definitely more distress earlier this mornin).    Appearance: She is ill-appearing.  Pulmonary:     Effort: Tachypnea present. No respiratory distress.     Breath sounds: Decreased breath sounds, wheezing and rhonchi present.  Musculoskeletal:     Right lower leg: No edema.     Left lower leg: No edema.  Skin:    General: Skin is warm and dry.   Neurological:     Mental Status: She is disoriented.  Psychiatric:        Behavior: Behavior normal. Behavior is not agitated.          Scheduled Medications:   insulin  aspart  0-9 Units Subcutaneous Q4H   levothyroxine   125 mcg Oral Q0600   memantine   5 mg Oral BID   metoprolol  tartrate  12.5 mg Oral Q6H   sertraline   25 mg Oral Daily    Continuous Infusions:  amiodarone      diltiazem  (CARDIZEM ) infusion Stopped (02/11/23 1446)   heparin  900 Units/hr (03/08/23 0800)   piperacillin -tazobactam (ZOSYN )  IV      PRN Medications:  acetaminophen  **OR** acetaminophen , haloperidol  lactate, ondansetron  **OR** ondansetron  (ZOFRAN ) IV  Antimicrobials from admission:  Anti-infectives (From admission, onward)    Start     Dose/Rate Route Frequency Ordered Stop   March 08, 2023 0900  piperacillin -tazobactam (ZOSYN ) IVPB 3.375 g        3.375 g 12.5 mL/hr over 240  Minutes Intravenous Every 8 hours 03/11/23 0806     02/11/23 1230  azithromycin  (ZITHROMAX ) 500 mg in sodium chloride  0.9 % 250 mL IVPB        500 mg 250 mL/hr over 60 Minutes Intravenous  Once 02/11/23 1136 02/11/23 1447   02/10/23 0600  azithromycin  (ZITHROMAX ) tablet 500 mg  Status:  Discontinued        500 mg Oral Daily 02/09/23 1429 02/11/23 1136   02/07/23 1200  cefTRIAXone  (ROCEPHIN ) 2 g in sodium chloride  0.9 % 100 mL IVPB        2 g 200 mL/hr over 30 Minutes Intravenous Every 24 hours 02/07/23 0147 02/11/23 1237   02/07/23 0600  azithromycin  (ZITHROMAX ) 500 mg in sodium chloride  0.9 % 250 mL IVPB  Status:  Discontinued        500 mg 250 mL/hr over 60 Minutes Intravenous Every 24 hours 02/07/23 0147 02/09/23 1429   02/07/23 0000  vancomycin  (VANCOREADY) IVPB 1250 mg/250 mL        1,250 mg 166.7 mL/hr over 90 Minutes Intravenous  Once 02/06/23 2351 02/07/23 0450   02/06/23 2345  ceFEPIme  (MAXIPIME ) 2 g in sodium chloride  0.9 % 100 mL IVPB        2 g 200 mL/hr over 30 Minutes Intravenous  Once 02/06/23 2337 02/07/23  0030           Data Reviewed:  I have personally reviewed the following...  CBC: Recent Labs  Lab 02/06/23 2314 02/08/23 0510 02/09/23 9365 02/10/23 0334 02/11/23 0422 02/12/23 0912 03-11-23 0414  WBC 22.9*   < > 23.1* 21.2* 18.8* 21.3* 22.2*  NEUTROABS 20.7*  --   --   --   --   --   --   HGB 11.7*   < > 10.3* 10.8* 11.6* 11.1* 11.8*  HCT 33.8*   < > 29.6* 30.8* 34.1* 32.3* 35.3*  MCV 90.4   < > 89.7 88.5 92.2 91.0 93.1  PLT 354   < > 313 334 325 309 299   < > = values in this interval not displayed.   Basic Metabolic Panel: Recent Labs  Lab 02/09/23 0634 02/10/23 0334 02/11/23 0422 02/12/23 0912 03-11-2023 0414  NA 142 144 147* 149* 147*  K 3.7 3.3* 3.1* 3.4* 4.3  CL 106 105 104 103 101  CO2 24 27 31  34* 34*  GLUCOSE 191* 187* 227* 138* 211*  BUN 26* 30* 31* 41* 39*  CREATININE 0.82 0.90 0.83 0.90 0.80  CALCIUM 8.9 9.0 8.7* 8.9 8.5*  MG  --   --   --   --  2.6*   GFR: Estimated Creatinine Clearance: 37.7 mL/min (by C-G formula based on SCr of 0.8 mg/dL). Liver Function Tests: Recent Labs  Lab 02/06/23 2314  AST 21  ALT 20  ALKPHOS 71  BILITOT 1.1  PROT 7.3  ALBUMIN 3.7   No results for input(s): LIPASE, AMYLASE in the last 168 hours. No results for input(s): AMMONIA in the last 168 hours. Coagulation Profile: Recent Labs  Lab 02/06/23 2314  INR 1.3*   Cardiac Enzymes: No results for input(s): CKTOTAL, CKMB, CKMBINDEX, TROPONINI in the last 168 hours. BNP (last 3 results) No results for input(s): PROBNP in the last 8760 hours. HbA1C: Recent Labs    02/12/23 1027  HGBA1C 6.7*   CBG: Recent Labs  Lab 02/12/23 1543 02/12/23 1953 03/11/23 0003 03-11-2023 0413 March 11, 2023 0857  GLUCAP 273* 219* 138* 212* 199*   Lipid Profile: No results  for input(s): CHOL, HDL, LDLCALC, TRIG, CHOLHDL, LDLDIRECT in the last 72 hours. Thyroid  Function Tests: No results for input(s): TSH, T4TOTAL, FREET4, T3FREE,  THYROIDAB in the last 72 hours.  Anemia Panel: No results for input(s): VITAMINB12, FOLATE, FERRITIN, TIBC, IRON, RETICCTPCT in the last 72 hours. Most Recent Urinalysis On File:     Component Value Date/Time   COLORURINE YELLOW (A) 02/07/2023 0654   APPEARANCEUR CLEAR (A) 02/07/2023 0654   LABSPEC >1.046 (H) 02/07/2023 0654   PHURINE 5.0 02/07/2023 0654   GLUCOSEU NEGATIVE 02/07/2023 0654   HGBUR NEGATIVE 02/07/2023 0654   BILIRUBINUR NEGATIVE 02/07/2023 0654   KETONESUR NEGATIVE 02/07/2023 0654   PROTEINUR NEGATIVE 02/07/2023 0654   NITRITE NEGATIVE 02/07/2023 0654   LEUKOCYTESUR NEGATIVE 02/07/2023 0654   Sepsis Labs: @LABRCNTIP (procalcitonin:4,lacticidven:4) Microbiology: Recent Results (from the past 240 hours)  Blood Culture (routine x 2)     Status: None   Collection Time: 02/06/23 11:14 PM   Specimen: BLOOD  Result Value Ref Range Status   Specimen Description BLOOD BLOOD RIGHT WRIST  Final   Special Requests   Final    BOTTLES DRAWN AEROBIC AND ANAEROBIC Blood Culture adequate volume   Culture   Final    NO GROWTH 5 DAYS Performed at [facility], [address] [place], [place] [address]    Report Status 02/11/2023 FINAL  Final  Blood Culture (routine x 2)     Status: None   Collection Time: 02/06/23 11:14 PM   Specimen: BLOOD  Result Value Ref Range Status   Specimen Description BLOOD BLOOD LEFT FOREARM  Final   Special Requests   Final    BOTTLES DRAWN AEROBIC AND ANAEROBIC Blood Culture adequate volume   Culture   Final    NO GROWTH 5 DAYS Performed at [facility], [address] [place], [place] [address]    Report Status 02/11/2023 FINAL  Final  Resp panel by RT-PCR (RSV, Flu A&B, Covid) Anterior Nasal Swab     Status: None   Collection Time: 02/06/23 11:35 PM   Specimen: Anterior Nasal Swab  Result Value Ref Range Status   SARS Coronavirus 2 by RT PCR NEGATIVE NEGATIVE Final    Comment: (NOTE) SARS-CoV-2 target  nucleic acids are NOT DETECTED.  The SARS-CoV-2 RNA is generally detectable in upper respiratory specimens during the acute phase of infection. The lowest concentration of SARS-CoV-2 viral copies this assay can detect is 138 copies/mL. A negative result does not preclude SARS-Cov-2 infection and should not be used as the sole basis for treatment or other patient management decisions. A negative result may occur with  improper specimen collection/handling, submission of specimen other than nasopharyngeal swab, presence of viral mutation(s) within the areas targeted by this assay, and inadequate number of viral copies(<138 copies/mL). A negative result must be combined with clinical observations, patient history, and epidemiological information. The expected result is Negative.  Fact Sheet for Patients:  bloggercourse.com  Fact Sheet for Healthcare Providers:  seriousbroker.it  This test is no t yet approved or cleared by the [place]  FDA and  has been authorized for detection and/or diagnosis of SARS-CoV-2 by FDA under an Emergency Use Authorization (EUA). This EUA will remain  in effect (meaning this test can be used) for the duration of the COVID-19 declaration under Section 564(b)(1) of the Act, 21 U.S.C.section 360bbb-3(b)(1), unless the authorization is terminated  or revoked sooner.       Influenza A by PCR NEGATIVE NEGATIVE Final   Influenza  B by PCR NEGATIVE NEGATIVE Final    Comment: (NOTE) The Xpert Xpress SARS-CoV-2/FLU/RSV plus assay is intended as an aid in the diagnosis of influenza from Nasopharyngeal swab specimens and should not be used as a sole basis for treatment. Nasal washings and aspirates are unacceptable for Xpert Xpress SARS-CoV-2/FLU/RSV testing.  Fact Sheet for Patients: bloggercourse.com  Fact Sheet for Healthcare  Providers: seriousbroker.it  This test is not yet approved or cleared by the [place]  FDA and has been authorized for detection and/or diagnosis of SARS-CoV-2 by FDA under an Emergency Use Authorization (EUA). This EUA will remain in effect (meaning this test can be used) for the duration of the COVID-19 declaration under Section 564(b)(1) of the Act, 21 U.S.C. section 360bbb-3(b)(1), unless the authorization is terminated or revoked.     Resp Syncytial Virus by PCR NEGATIVE NEGATIVE Final    Comment: (NOTE) Fact Sheet for Patients: bloggercourse.com  Fact Sheet for Healthcare Providers: seriousbroker.it  This test is not yet approved or cleared by the [place]  FDA and has been authorized for detection and/or diagnosis of SARS-CoV-2 by FDA under an Emergency Use Authorization (EUA). This EUA will remain in effect (meaning this test can be used) for the duration of the COVID-19 declaration under Section 564(b)(1) of the Act, 21 U.S.C. section 360bbb-3(b)(1), unless the authorization is terminated or revoked.  Performed at [facility], [address] [place], [place] [address]   Respiratory (~20 pathogens) panel by PCR     Status: Abnormal   Collection Time: 02/07/23  9:21 AM   Specimen: Nasopharyngeal Swab; Respiratory  Result Value Ref Range Status   Adenovirus NOT DETECTED NOT DETECTED Final   Coronavirus 229E NOT DETECTED NOT DETECTED Final    Comment: (NOTE) The Coronavirus on the Respiratory Panel, DOES NOT test for the novel  Coronavirus (2019 nCoV)    Coronavirus HKU1 NOT DETECTED NOT DETECTED Final   Coronavirus NL63 NOT DETECTED NOT DETECTED Final   Coronavirus OC43 NOT DETECTED NOT DETECTED Final   Metapneumovirus NOT DETECTED NOT DETECTED Final   Rhinovirus / Enterovirus DETECTED (A) NOT DETECTED Final   Influenza A NOT DETECTED NOT DETECTED Final   Influenza B NOT  DETECTED NOT DETECTED Final   Parainfluenza Virus 1 NOT DETECTED NOT DETECTED Final   Parainfluenza Virus 2 NOT DETECTED NOT DETECTED Final   Parainfluenza Virus 3 NOT DETECTED NOT DETECTED Final   Parainfluenza Virus 4 NOT DETECTED NOT DETECTED Final   Respiratory Syncytial Virus NOT DETECTED NOT DETECTED Final   Bordetella pertussis NOT DETECTED NOT DETECTED Final   Bordetella Parapertussis NOT DETECTED NOT DETECTED Final   Chlamydophila pneumoniae NOT DETECTED NOT DETECTED Final   Mycoplasma pneumoniae NOT DETECTED NOT DETECTED Final    Comment: Performed at [facility], [address], [place], [place] [address]  MRSA Next Gen by PCR, Nasal     Status: None   Collection Time: 02/07/23 10:14 AM   Specimen: Nasal Mucosa; Nasal Swab  Result Value Ref Range Status   MRSA by PCR Next Gen NOT DETECTED NOT DETECTED Final    Comment: (NOTE) The GeneXpert MRSA Assay (FDA approved for NASAL specimens only), is one component of a comprehensive MRSA colonization surveillance program. It is not intended to diagnose MRSA infection nor to guide or monitor treatment for MRSA infections. Test performance is not FDA approved in patients less than 61 years old. Performed at [facility], [address], [place], [place] [address]  Radiology Studies last 3 days: DG Chest Port 1 View Result Date: 02/11/2023 CLINICAL DATA:  36304 Hypoxemia 36304 EXAM: PORTABLE CHEST 1 VIEW COMPARISON:  Chest x-ray 02/06/2023, CT chest 02/07/2023 FINDINGS: Patient is rotated. The heart and mediastinal contours are unchanged. Atherosclerotic plaque. Interval worsening of right upper lobe airspace opacity. Chronic coarsened interstitial markings with likely superimposed mild pulmonary edema. Interval development of at least bilateral small, left greater than right, pleural effusions. No pneumothorax. No acute osseous abnormality. Thoracic kyphoplasty. Thoracolumbar surgical hardware corpectomy.  Multiple old healed left rib fractures. IMPRESSION: 1. Interval worsening of right upper lobe airspace opacity. 2. Likely mild pulmonary edema. 3. Interval development of at least bilateral small, left greater than right, pleural effusions. Electronically Signed   By: Morgane  Naveau M.D.   On: 02/11/2023 01:47          Laneta Blunt, DO [facility] 03/11/23, 9:09 AM    Dictation software may have been used to generate the above note. Typos may occur and escape review in typed/dictated notes. Please contact Dr Blunt directly for clarity if needed.  Staff may message me via secure chat in Epic  but this may not receive an immediate response,  please page me for urgent matters!  If 7PM-7AM, please contact night coverage [email or web address]

## 2023-03-04 NOTE — Progress Notes (Signed)
 Updated NP Jawo about heart rate continues to remain in 150's. Given orders to check temperature rectally, temp 98.9. No new orders at this time.

## 2023-03-04 NOTE — Progress Notes (Signed)
   February 23, 2023 0300  Assess: MEWS Score  BP 97/86  MAP (mmHg) 91  Pulse Rate (!) 151  ECG Heart Rate (!) 156  Resp (!) 27  SpO2 94 %  Assess: MEWS Score  MEWS Temp 0  MEWS Systolic 1  MEWS Pulse 3  MEWS RR 2  MEWS LOC 0  MEWS Score 6  MEWS Score Color Red  Assess: if the MEWS score is Yellow or Red  Were vital signs accurate and taken at a resting state? Yes  Does the patient meet 2 or more of the SIRS criteria? Yes  Does the patient have a confirmed or suspected source of infection? Yes  MEWS guidelines implemented  No, previously red, continue vital signs every 4 hours  Notify: Charge Nurse/RN  Name of Charge Nurse/RN Notified saddie Shams  Provider Notification  Provider Name/Title NP Darleene  Date Provider Notified February 23, 2023  Time Provider Notified 0304  Method of Notification  (secure chat)  Notification Reason Change in status  Provider response No new orders  Date of Provider Response 23-Feb-2023  Time of Provider Response 0309  Assess: SIRS CRITERIA  SIRS Temperature  0  SIRS Respirations  1  SIRS Pulse 1  SIRS WBC 0  SIRS Score Sum  2

## 2023-03-04 NOTE — Progress Notes (Signed)
 Prior mews red improving patient now NSR  2023/03/01 1100  Assess: MEWS Score  BP (!) 140/68  MAP (mmHg) 89  Pulse Rate (!) 58  ECG Heart Rate 74  Resp (!) 27  SpO2 97 %  Assess: MEWS Score  MEWS Temp 0  MEWS Systolic 0  MEWS Pulse 0  MEWS RR 2  MEWS LOC 1  MEWS Score 3  MEWS Score Color Yellow  Assess: SIRS CRITERIA  SIRS Temperature  0  SIRS Respirations  1  SIRS Pulse 0  SIRS WBC 1  SIRS Score Sum  2

## 2023-03-04 NOTE — Plan of Care (Signed)
  Problem: Clinical Measurements: Goal: Signs and symptoms of infection will decrease Outcome: Not Progressing   Problem: Respiratory: Goal: Ability to maintain adequate ventilation will improve Outcome: Not Progressing   Problem: Clinical Measurements: Goal: Cardiovascular complication will be avoided Outcome: Not Progressing

## 2023-03-04 NOTE — Progress Notes (Signed)
 ANTICOAGULATION CONSULT NOTE  Pharmacy Consult for heparin  infusion Indication: atrial fibrillation  Allergies  Allergen Reactions   Atorvastatin Cough   Patient Measurements: Height: 5' 2 (157.5 cm) Weight: 54.4 kg (119 lb 14.9 oz) IBW/kg (Calculated) : 50.1 Heparin  Dosing Weight: 58 kg  Vital Signs: Temp: 98.9 F (37.2 C) 02-14-2023 0408) Temp Source: Rectal 02-14-2023 0408) BP: 151/89 14-Feb-2023 0500) Pulse Rate: 80 02-14-23 0500)  Labs: Recent Labs    02/11/23 0422 02/11/23 1456 02/11/23 2245 02/12/23 0912 02/14/2023 0414  HGB 11.6*  --   --  11.1* 11.8*  HCT 34.1*  --   --  32.3* 35.3*  PLT 325  --   --  309 299  HEPARINUNFRC 0.72*   < > 0.59 0.67 0.89*  CREATININE 0.83  --   --  0.90 0.80   < > = values in this interval not displayed.   Estimated Creatinine Clearance: 37.7 mL/min (by C-G formula based on SCr of 0.8 mg/dL).  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 In the ED they were noted to have atrial fibrillation with rapid ventricular response, currently in sinus rhythm. Pharmacy has been consulted for initiation and management of heparin  infusion for atrial fibrillation. Hgb stable (seems to be baseline)  Goal  of Therapy:  Heparin  level 0.3-0.7 units/ml Monitor platelets by anticoagulation protocol: Yes  Heparin  Levels Date/Time HL Clinical Assessment  1/7@0740   0.33 Therapeutic x 1  1/7@1501   0.75 SUPRAtherapeutic   1/8 @ 0228 0.30 Therapeutic x 1  1/8 @ 1104 0.21 Subtherapeutic   1/8 @ 2138 0.17 Subtherapeutic  1/9 @ 0634 0.53 Therapeutic x 1   1/9 @ 1418 0.48 Therapeutic x 2   1/10 0334 0.45 Therapeutic x 3  1/11 0422 0.72 Supratherapeutic  1/11@1456  0.38 Therapeutic x 1  1/11 2245 0.59 Therapeutic x 2  1/12 0912 0.67 Called lab to get result  2023/02/14 0414 0.89 Supratherapeutic   Plan:   Decrease heparin  infusion to 900 units Recheck heparin  level in 8 hrs after rate change CBC daily with AM labs.   Thank you for involving pharmacy in this patient's care.   Rankin CANDIE Dills, PharmD, [facility] 2023-02-14 5:45 AM

## 2023-03-04 NOTE — Progress Notes (Signed)
 Patient went into afib with RVR heart rate in the 170's Notified NP Jawo about patient's condition. Completed EKG per NP request, obtained temperature low grade temp. Administered rectal tylenol and begin  amiodarone infusion per new orders.

## 2023-03-04 NOTE — Progress Notes (Signed)
 Prior mews have been RED orders from nights have already been started  Feb 27, 2023 0700  Assess: MEWS Score  BP 137/73  MAP (mmHg) 91  Pulse Rate 89  ECG Heart Rate (!) 129  Resp 15  Level of Consciousness Alert  SpO2 97 %  O2 Device HFNC  Patient Activity (if Appropriate) In bed  O2 Flow Rate (L/min) 8 L/min  Assess: MEWS Score  MEWS Temp 0  MEWS Systolic 0  MEWS Pulse 2  MEWS RR 0  MEWS LOC 0  MEWS Score 2  MEWS Score Color Yellow  Assess: if the MEWS score is Yellow or Red  Were vital signs accurate and taken at a resting state? Yes  Does the patient meet 2 or more of the SIRS criteria? Yes  Does the patient have a confirmed or suspected source of infection? Yes  MEWS guidelines implemented  No, previously red, continue vital signs every 4 hours  Assess: SIRS CRITERIA  SIRS Temperature  0  SIRS Respirations  0  SIRS Pulse 1  SIRS WBC 1  SIRS Score Sum  2

## 2023-03-04 DEATH — deceased
